# Patient Record
Sex: Male | Born: 1935 | Race: White | Hispanic: No | Marital: Married | Smoking: Never smoker
Health system: Southern US, Community
[De-identification: ages and names within clinical notes are randomized; demographics above are authoritative.]

## PROBLEM LIST (undated history)

## (undated) DIAGNOSIS — N429 Disorder of prostate, unspecified: Secondary | ICD-10-CM

## (undated) DIAGNOSIS — K469 Unspecified abdominal hernia without obstruction or gangrene: Secondary | ICD-10-CM

## (undated) DIAGNOSIS — T4145XA Adverse effect of unspecified anesthetic, initial encounter: Secondary | ICD-10-CM

## (undated) DIAGNOSIS — I509 Heart failure, unspecified: Secondary | ICD-10-CM

## (undated) DIAGNOSIS — N400 Enlarged prostate without lower urinary tract symptoms: Secondary | ICD-10-CM

## (undated) DIAGNOSIS — M1990 Unspecified osteoarthritis, unspecified site: Secondary | ICD-10-CM

## (undated) DIAGNOSIS — D649 Anemia, unspecified: Secondary | ICD-10-CM

## (undated) DIAGNOSIS — I10 Essential (primary) hypertension: Secondary | ICD-10-CM

## (undated) DIAGNOSIS — G4700 Insomnia, unspecified: Secondary | ICD-10-CM

## (undated) DIAGNOSIS — T8859XA Other complications of anesthesia, initial encounter: Secondary | ICD-10-CM

## (undated) DIAGNOSIS — N289 Disorder of kidney and ureter, unspecified: Secondary | ICD-10-CM

## (undated) HISTORY — PX: CATARACT EXTRACTION: SUR2

## (undated) HISTORY — DX: Unspecified abdominal hernia without obstruction or gangrene: K46.9

## (undated) HISTORY — DX: Anemia, unspecified: D64.9

## (undated) HISTORY — DX: Insomnia, unspecified: G47.00

## (undated) HISTORY — DX: Essential (primary) hypertension: I10

## (undated) HISTORY — DX: Heart failure, unspecified: I50.9

## (undated) HISTORY — DX: Benign prostatic hyperplasia without lower urinary tract symptoms: N40.0

## (undated) HISTORY — PX: CHOLECYSTECTOMY: SHX55

## (undated) HISTORY — DX: Disorder of prostate, unspecified: N42.9

## (undated) HISTORY — PX: JOINT REPLACEMENT: SHX530

---

## 2004-11-24 ENCOUNTER — Ambulatory Visit: Payer: Self-pay | Admitting: Internal Medicine

## 2004-11-29 ENCOUNTER — Encounter: Admission: RE | Admit: 2004-11-29 | Discharge: 2004-11-29 | Payer: Self-pay | Admitting: Orthopedic Surgery

## 2004-12-01 ENCOUNTER — Ambulatory Visit (HOSPITAL_COMMUNITY): Admission: RE | Admit: 2004-12-01 | Discharge: 2004-12-01 | Payer: Self-pay | Admitting: Orthopedic Surgery

## 2004-12-01 ENCOUNTER — Encounter (INDEPENDENT_AMBULATORY_CARE_PROVIDER_SITE_OTHER): Payer: Self-pay | Admitting: Specialist

## 2004-12-01 ENCOUNTER — Ambulatory Visit (HOSPITAL_BASED_OUTPATIENT_CLINIC_OR_DEPARTMENT_OTHER): Admission: RE | Admit: 2004-12-01 | Discharge: 2004-12-01 | Payer: Self-pay | Admitting: Orthopedic Surgery

## 2004-12-05 ENCOUNTER — Emergency Department (HOSPITAL_COMMUNITY): Admission: EM | Admit: 2004-12-05 | Discharge: 2004-12-05 | Payer: Self-pay | Admitting: Emergency Medicine

## 2005-05-26 ENCOUNTER — Ambulatory Visit: Payer: Self-pay | Admitting: Internal Medicine

## 2005-07-19 ENCOUNTER — Ambulatory Visit: Payer: Self-pay | Admitting: Internal Medicine

## 2005-07-25 ENCOUNTER — Ambulatory Visit: Payer: Self-pay

## 2006-02-17 ENCOUNTER — Ambulatory Visit: Payer: Self-pay | Admitting: Internal Medicine

## 2006-02-20 ENCOUNTER — Ambulatory Visit: Payer: Self-pay | Admitting: Internal Medicine

## 2006-02-21 ENCOUNTER — Ambulatory Visit: Payer: Self-pay | Admitting: Internal Medicine

## 2006-03-08 ENCOUNTER — Ambulatory Visit: Payer: Self-pay | Admitting: Internal Medicine

## 2006-08-28 ENCOUNTER — Ambulatory Visit: Payer: Self-pay | Admitting: Internal Medicine

## 2006-08-28 LAB — CONVERTED CEMR LAB
BUN: 19 mg/dL (ref 6–23)
CO2: 25 meq/L (ref 19–32)
Calcium: 9.2 mg/dL (ref 8.4–10.5)
Chloride: 109 meq/L (ref 96–112)
Creatinine, Ser: 1.8 mg/dL — ABNORMAL HIGH (ref 0.4–1.5)
GFR calc non Af Amer: 39 mL/min
Glomerular Filtration Rate, Af Am: 47 mL/min/{1.73_m2}
Glucose, Bld: 84 mg/dL (ref 70–99)
PSA: 2.51 ng/mL (ref 0.10–4.00)
Potassium: 4.1 meq/L (ref 3.5–5.1)
Sodium: 141 meq/L (ref 135–145)
Uric Acid, Serum: 8.9 mg/dL — ABNORMAL HIGH (ref 2.4–7.0)

## 2007-02-28 ENCOUNTER — Ambulatory Visit: Payer: Self-pay | Admitting: Internal Medicine

## 2007-02-28 LAB — CONVERTED CEMR LAB
BUN: 23 mg/dL (ref 6–23)
CO2: 22 meq/L (ref 19–32)
Calcium: 9.8 mg/dL (ref 8.4–10.5)
Chloride: 115 meq/L — ABNORMAL HIGH (ref 96–112)
Creatinine, Ser: 1.8 mg/dL — ABNORMAL HIGH (ref 0.4–1.5)
GFR calc Af Amer: 47 mL/min
GFR calc non Af Amer: 39 mL/min
Glucose, Bld: 119 mg/dL — ABNORMAL HIGH (ref 70–99)
Potassium: 4.2 meq/L (ref 3.5–5.1)
Sodium: 142 meq/L (ref 135–145)

## 2007-03-02 DIAGNOSIS — I10 Essential (primary) hypertension: Secondary | ICD-10-CM | POA: Insufficient documentation

## 2007-03-02 DIAGNOSIS — G4700 Insomnia, unspecified: Secondary | ICD-10-CM | POA: Insufficient documentation

## 2007-03-02 DIAGNOSIS — M109 Gout, unspecified: Secondary | ICD-10-CM

## 2007-03-12 DIAGNOSIS — N400 Enlarged prostate without lower urinary tract symptoms: Secondary | ICD-10-CM | POA: Insufficient documentation

## 2007-06-01 ENCOUNTER — Ambulatory Visit: Payer: Self-pay | Admitting: Internal Medicine

## 2007-11-26 ENCOUNTER — Ambulatory Visit: Payer: Self-pay | Admitting: Internal Medicine

## 2007-11-26 DIAGNOSIS — R7309 Other abnormal glucose: Secondary | ICD-10-CM | POA: Insufficient documentation

## 2007-11-26 DIAGNOSIS — N259 Disorder resulting from impaired renal tubular function, unspecified: Secondary | ICD-10-CM | POA: Insufficient documentation

## 2007-11-27 LAB — CONVERTED CEMR LAB
CO2: 25 meq/L (ref 19–32)
Cholesterol: 139 mg/dL (ref 0–200)
Creatinine, Ser: 1.9 mg/dL — ABNORMAL HIGH (ref 0.4–1.5)
GFR calc Af Amer: 44 mL/min
Glucose, Bld: 134 mg/dL — ABNORMAL HIGH (ref 70–99)
Potassium: 4.4 meq/L (ref 3.5–5.1)
VLDL: 37 mg/dL (ref 0–40)

## 2007-12-18 ENCOUNTER — Telehealth: Payer: Self-pay | Admitting: Internal Medicine

## 2008-05-28 ENCOUNTER — Ambulatory Visit: Payer: Self-pay | Admitting: Internal Medicine

## 2008-05-28 DIAGNOSIS — R6889 Other general symptoms and signs: Secondary | ICD-10-CM

## 2008-05-29 LAB — CONVERTED CEMR LAB
BUN: 35 mg/dL — ABNORMAL HIGH (ref 6–23)
Calcium: 9.7 mg/dL (ref 8.4–10.5)
GFR calc Af Amer: 41 mL/min
Glucose, Bld: 107 mg/dL — ABNORMAL HIGH (ref 70–99)

## 2008-08-11 ENCOUNTER — Encounter: Payer: Self-pay | Admitting: Internal Medicine

## 2008-09-02 ENCOUNTER — Telehealth: Payer: Self-pay | Admitting: Internal Medicine

## 2008-09-04 ENCOUNTER — Encounter: Payer: Self-pay | Admitting: Internal Medicine

## 2008-09-11 ENCOUNTER — Encounter: Payer: Self-pay | Admitting: Internal Medicine

## 2008-09-12 ENCOUNTER — Ambulatory Visit: Payer: Self-pay | Admitting: Internal Medicine

## 2008-09-16 ENCOUNTER — Telehealth: Payer: Self-pay | Admitting: *Deleted

## 2008-10-02 ENCOUNTER — Encounter: Payer: Self-pay | Admitting: Internal Medicine

## 2008-10-02 ENCOUNTER — Ambulatory Visit (HOSPITAL_COMMUNITY): Admission: RE | Admit: 2008-10-02 | Discharge: 2008-10-02 | Payer: Self-pay | Admitting: General Surgery

## 2008-10-02 LAB — CONVERTED CEMR LAB: Creatinine, Ser: 2.4 mg/dL

## 2008-10-06 ENCOUNTER — Ambulatory Visit: Payer: Self-pay | Admitting: Internal Medicine

## 2008-10-06 LAB — CONVERTED CEMR LAB: Sodium, Ur: 42 meq/L

## 2008-10-07 LAB — CONVERTED CEMR LAB
BUN: 57 mg/dL — ABNORMAL HIGH (ref 6–23)
CO2: 16 meq/L — ABNORMAL LOW (ref 19–32)
Calcium: 9.1 mg/dL (ref 8.4–10.5)
Creatinine, Ser: 2.6 mg/dL — ABNORMAL HIGH (ref 0.4–1.5)
GFR calc non Af Amer: 25 mL/min
Glucose, Bld: 117 mg/dL — ABNORMAL HIGH (ref 70–99)

## 2008-10-09 ENCOUNTER — Ambulatory Visit: Payer: Self-pay | Admitting: Internal Medicine

## 2008-10-10 ENCOUNTER — Encounter: Payer: Self-pay | Admitting: Internal Medicine

## 2008-10-10 LAB — CONVERTED CEMR LAB
GFR calc Af Amer: 41 mL/min
GFR calc non Af Amer: 34 mL/min
Glucose, Bld: 169 mg/dL — ABNORMAL HIGH (ref 70–99)
Potassium: 4.3 meq/L (ref 3.5–5.1)

## 2008-10-13 ENCOUNTER — Encounter: Payer: Self-pay | Admitting: Internal Medicine

## 2008-10-15 ENCOUNTER — Ambulatory Visit: Payer: Self-pay | Admitting: Internal Medicine

## 2008-10-16 LAB — CONVERTED CEMR LAB
Calcium: 8.7 mg/dL (ref 8.4–10.5)
Chloride: 105 meq/L (ref 96–112)
Potassium: 3.8 meq/L (ref 3.5–5.1)

## 2008-10-30 ENCOUNTER — Encounter: Payer: Self-pay | Admitting: Internal Medicine

## 2008-11-03 ENCOUNTER — Encounter: Payer: Self-pay | Admitting: Internal Medicine

## 2008-11-24 ENCOUNTER — Ambulatory Visit (HOSPITAL_COMMUNITY): Admission: RE | Admit: 2008-11-24 | Discharge: 2008-11-24 | Payer: Self-pay | Admitting: General Surgery

## 2008-11-24 ENCOUNTER — Encounter (INDEPENDENT_AMBULATORY_CARE_PROVIDER_SITE_OTHER): Payer: Self-pay | Admitting: General Surgery

## 2008-12-02 ENCOUNTER — Ambulatory Visit: Payer: Self-pay | Admitting: Internal Medicine

## 2008-12-04 LAB — CONVERTED CEMR LAB
BUN: 23 mg/dL (ref 6–23)
Creatinine, Ser: 1.6 mg/dL — ABNORMAL HIGH (ref 0.4–1.5)
GFR calc non Af Amer: 44.22 mL/min (ref >60)
Glucose, Bld: 101 mg/dL — ABNORMAL HIGH (ref 70–99)

## 2008-12-05 ENCOUNTER — Encounter: Payer: Self-pay | Admitting: Internal Medicine

## 2008-12-29 ENCOUNTER — Encounter: Payer: Self-pay | Admitting: Internal Medicine

## 2009-01-21 ENCOUNTER — Encounter: Payer: Self-pay | Admitting: Internal Medicine

## 2009-02-19 ENCOUNTER — Ambulatory Visit: Payer: Self-pay | Admitting: Internal Medicine

## 2009-03-06 ENCOUNTER — Encounter: Payer: Self-pay | Admitting: Internal Medicine

## 2009-05-14 ENCOUNTER — Ambulatory Visit: Payer: Self-pay | Admitting: Internal Medicine

## 2009-05-15 LAB — CONVERTED CEMR LAB
Calcium: 9.3 mg/dL (ref 8.4–10.5)
Creatinine, Ser: 1.9 mg/dL — ABNORMAL HIGH (ref 0.4–1.5)
Glucose, Bld: 98 mg/dL (ref 70–99)
HDL: 28.3 mg/dL — ABNORMAL LOW (ref >39.00)
Potassium: 4.5 meq/L (ref 3.5–5.1)
Sodium: 145 meq/L (ref 135–145)
Triglycerides: 100 mg/dL (ref 0.0–149.0)
VLDL: 20 mg/dL (ref 0.0–40.0)

## 2009-09-03 ENCOUNTER — Encounter: Payer: Self-pay | Admitting: Internal Medicine

## 2009-11-17 ENCOUNTER — Ambulatory Visit: Payer: Self-pay | Admitting: Internal Medicine

## 2009-11-17 DIAGNOSIS — D696 Thrombocytopenia, unspecified: Secondary | ICD-10-CM

## 2009-11-18 LAB — CONVERTED CEMR LAB
ALT: 23 units/L (ref 0–53)
BUN: 30 mg/dL — ABNORMAL HIGH (ref 6–23)
Basophils Relative: 0.3 % (ref 0.0–3.0)
Calcium: 9.7 mg/dL (ref 8.4–10.5)
Chloride: 112 meq/L (ref 96–112)
Creatinine, Ser: 1.7 mg/dL — ABNORMAL HIGH (ref 0.4–1.5)
GFR calc non Af Amer: 41.13 mL/min (ref >60)
Glucose, Bld: 98 mg/dL (ref 70–99)
Hgb A1c MFr Bld: 5.7 % (ref 4.6–6.5)
Lymphocytes Relative: 16.5 % (ref 12.0–46.0)
MCHC: 33.8 g/dL (ref 30.0–36.0)
Neutrophils Relative %: 73 % (ref 43.0–77.0)
Platelets: 96 10*3/uL — ABNORMAL LOW (ref 150.0–400.0)
RDW: 13.9 % (ref 11.5–14.6)
Sed Rate: 25 mm/hr — ABNORMAL HIGH (ref 0–22)
Sodium: 146 meq/L — ABNORMAL HIGH (ref 135–145)
Total Protein: 6.6 g/dL (ref 6.0–8.3)
WBC: 6 10*3/uL (ref 4.5–10.5)

## 2009-11-24 ENCOUNTER — Encounter: Payer: Self-pay | Admitting: Internal Medicine

## 2010-01-11 ENCOUNTER — Ambulatory Visit: Payer: Self-pay | Admitting: Internal Medicine

## 2010-01-11 LAB — CONVERTED CEMR LAB
ALT: 21 units/L (ref 0–53)
AST: 38 units/L — ABNORMAL HIGH (ref 0–37)
Alkaline Phosphatase: 133 units/L — ABNORMAL HIGH (ref 39–117)
Chloride: 113 meq/L — ABNORMAL HIGH (ref 96–112)
Creatinine, Ser: 1.4 mg/dL (ref 0.4–1.5)
Potassium: 4 meq/L (ref 3.5–5.1)
Sodium: 146 meq/L — ABNORMAL HIGH (ref 135–145)

## 2010-01-12 ENCOUNTER — Encounter: Payer: Self-pay | Admitting: Internal Medicine

## 2010-01-12 ENCOUNTER — Ambulatory Visit: Payer: Self-pay

## 2010-01-12 ENCOUNTER — Telehealth: Payer: Self-pay | Admitting: Internal Medicine

## 2010-04-01 ENCOUNTER — Telehealth: Payer: Self-pay | Admitting: Internal Medicine

## 2010-05-31 ENCOUNTER — Encounter: Payer: Self-pay | Admitting: Internal Medicine

## 2010-07-27 ENCOUNTER — Ambulatory Visit: Payer: Self-pay | Admitting: Internal Medicine

## 2010-07-27 LAB — CONVERTED CEMR LAB
Eosinophils Absolute: 0 10*3/uL (ref 0.0–0.7)
Ferritin: 201.5 ng/mL (ref 22.0–322.0)
HCT: 33.1 % — ABNORMAL LOW (ref 39.0–52.0)
Hemoglobin: 11.2 g/dL — ABNORMAL LOW (ref 13.0–17.0)
Lymphs Abs: 1 10*3/uL (ref 0.7–4.0)
MCHC: 33.8 g/dL (ref 30.0–36.0)
MCV: 98.3 fL (ref 78.0–100.0)
Monocytes Absolute: 0.4 10*3/uL (ref 0.1–1.0)
Neutro Abs: 2.9 10*3/uL (ref 1.4–7.7)
Platelets: 100 10*3/uL — ABNORMAL LOW (ref 150.0–400.0)
Saturation Ratios: 36.2 % (ref 20.0–50.0)
TSH: 3.34 microintl units/mL (ref 0.35–5.50)
Vitamin B-12: 392 pg/mL (ref 211–911)
WBC: 4.3 10*3/uL — ABNORMAL LOW (ref 4.5–10.5)

## 2010-07-30 ENCOUNTER — Encounter: Payer: Self-pay | Admitting: Internal Medicine

## 2010-07-30 ENCOUNTER — Ambulatory Visit: Payer: Self-pay | Admitting: Internal Medicine

## 2010-07-30 LAB — CONVERTED CEMR LAB: Sed Rate: 22 mm/hr (ref 0–22)

## 2010-08-09 LAB — CONVERTED CEMR LAB
Albumin ELP: 62.7 % (ref 55.8–66.1)
Basophils Absolute: 0 10*3/uL (ref 0.0–0.1)
Beta Globulin: 5.9 % (ref 4.7–7.2)
Eosinophils Absolute: 0.1 10*3/uL (ref 0.0–0.7)
Eosinophils Relative: 2 % (ref 0–5)
HCT: 33.8 % — ABNORMAL LOW (ref 39.0–52.0)
Hemoglobin: 11 g/dL — ABNORMAL LOW (ref 13.0–17.0)
Lymphs Abs: 1 10*3/uL (ref 0.7–4.0)
MCHC: 32.5 g/dL (ref 30.0–36.0)
Neutro Abs: 3.1 10*3/uL (ref 1.7–7.7)
Total Protein, Serum Electrophoresis: 6.1 g/dL (ref 6.0–8.3)

## 2010-09-21 NOTE — Letter (Signed)
Summary: [facility]  [facility]   Imported By: Maryln Gottron 06/07/2010 10:09:47  _____________________________________________________________________  External Attachment:    Type:   Image     Comment:   External Document

## 2010-09-21 NOTE — Miscellaneous (Signed)
Summary: Orders Update  Clinical Lists Changes  Problems: Added new problem of EDEMA (ICD-782.3) Orders: Added new Test order of Venous Duplex Lower Extremity (Venous Duplex Lower) - Signed 

## 2010-09-21 NOTE — Progress Notes (Signed)
Summary: [facility] - Pt Negative for DVT Bilateral  Phone Note Call from Patient   Caller: [facility]  -  [name]  Summary of Call: Pt is Negative for DVT Bilateral. Pt is being released.    Initial call taken by: Lucy Antigua,  Jan 12, 2010 2:18 PM

## 2010-09-21 NOTE — Assessment & Plan Note (Signed)
Summary: 6 MONTH ROV/NJR----PT RSC (BUMP) // RS rsc bmp ok per wife/njr   Vital Signs:  Patient profile:   77 year old male Weight:      153 pounds BMI:     26.36 Temp:     97.7 degrees F oral Pulse rate:   92 / minute Pulse rhythm:   regular Resp:     12 per minute BP sitting:   112 / 60  (left arm)  Vitals Entered By: Gladis Riffle, RN (November 17, 2009 10:31 AM) CC: 6 month rov, brought lab work copy Is Patient Diabetic? No Comments had CT scan abdomen last week by Dr Julien Girt for hematuria, gets results 4/5   CC:  6 month rov and brought lab work copy.  History of Present Illness: his main complaint is insomnia---he has trouble falling asleep and with frequent awakenings. he admits to daytime napping if he sleeps poorly during the night. Can't "turn my brain off"  also complains of being "cold" all of the time. He does not exercise. This has been ongoing for 6 months.   Hyperglycemia---brings in lab reports with glucose of 212  HTN--tolerating meds  renal insuff---needs f/u  All other systems reviewed and were negative   Preventive Screening-Counseling & Management  Alcohol-Tobacco     Smoking Status: never  Current Problems (verified): 1)  Renal Insufficiency  (ICD-588.9) 2)  Hyperglycemia  (ICD-790.29) 3)  Insomnia, Persistent  (ICD-307.42) 4)  Benign Prostatic Hypertrophy  (ICD-600.00) 5)  Hypertension  (ICD-401.9) 6)  Gout  (ICD-274.9)  Current Medications (verified): 1)  Colchicine 0.6 Mg  Tabs (Colchicine) .... Once Daily Tab 2)  Terazosin Hcl 5 Mg  Caps (Terazosin Hcl) .Marland Kitchen.. 1 Tab Once Daily 3)  Aspirin 81 Mg  Tbec (Aspirin) .... Take 1 Tablet By Mouth Once A Day Hold 4)  Lisinopril 40 Mg Tabs (Lisinopril) .... 1/2 Once Daily 5)  Allopurinol 300 Mg Tabs (Allopurinol) .... Take 1 Tablet By Mouth Once A Day  Allergies (verified): No Known Drug Allergies  Past History:  Past Medical History: Last updated:  03/13/2007 Gout--Tophareos insomnia Hypertension Benign prostatic hypertrophy  Past Surgical History: Last updated: 12/02/2008 Cataract extraction, bilateral Cholecystectomy inguinal hernia  Social History: Last updated: 06/01/2007 Married Never Smoked Regular exercise-yes  Risk Factors: Exercise: yes (06/01/2007)  Risk Factors: Smoking Status: never (11/17/2009)  Family History: NO DM  Review of Systems       All other systems reviewed and were negative   Physical Exam  General:  Well-developed,well-nourished,in no acute distress; alert,appropriate and cooperative throughout examination; blood pressure low normal Head:  normocephalic and atraumatic.   Eyes:  pupils equal and pupils round.   Neck:  No deformities, masses, or tenderness noted. Lungs:  normal respiratory effort and no intercostal retractions.   Heart:  normal rate and regular rhythm.   Abdomen:  soft and non-tender.   Msk:  No deformity or scoliosis noted of thoracic or lumbar spine.   Neurologic:  cranial nerves II-XII intact and gait normal.     Impression & Recommendations:  Problem # 1:  HYPERGLYCEMIA (ICD-790.29)  new dx of dm discussed at length regular exercise is critical  Orders: TLB-TSH (Thyroid Stimulating Hormone) (84443-TSH) TLB-Hepatic/Liver Function Pnl (80076-HEPATIC) TLB-A1C / Hgb A1C (Glycohemoglobin) (83036-A1C)  Problem # 2:  INSOMNIA, PERSISTENT (ICD-307.42) trial trazodone side effects discussed  Problem # 3:  HYPERTENSION (ICD-401.9)  congtrolled continue current medications  His updated medication list for this problem includes:    Terazosin Hcl 5  Mg Caps (Terazosin hcl) .Marland Kitchen... 1 tab once daily    Lisinopril 40 Mg Tabs (Lisinopril) .Marland Kitchen... 1/2 once daily  BP today: 112/60 Prior BP: 138/60 (05/14/2009)  Labs Reviewed: K+: 4.5 (05/14/2009) Creat: : 1.9 (05/14/2009)   Chol: 128 (05/14/2009)   HDL: 28.30 (05/14/2009)   LDL: 80 (05/14/2009)   TG: 100.0  (05/14/2009)  Orders: TLB-BMP (Basic Metabolic Panel-BMET) (80048-METABOL)  Problem # 4:  GOUT (ICD-274.9) no recurrence.  The following medications were removed from the medication list:    Uloric 40 Mg Tabs (Febuxostat) ..... One by mouth daily His updated medication list for this problem includes:    Allopurinol 300 Mg Tabs (Allopurinol) .Marland Kitchen... Take 1 tablet by mouth once a day  Problem # 5:  THROMBOCYTOPENIA (ICD-287.5)  reviewed labs from dr truslow  Orders: Venipuncture (16109) TLB-CBC Platelet - w/Differential (85025-CBCD) TLB-Sedimentation Rate (ESR) (85652-ESR)  Complete Medication List: 1)  Terazosin Hcl 5 Mg Caps (Terazosin hcl) .Marland Kitchen.. 1 tab once daily 2)  Aspirin 81 Mg Tbec (Aspirin) .... Take 1 tablet by mouth once a day hold 3)  Lisinopril 40 Mg Tabs (Lisinopril) .... 1/2 once daily 4)  Allopurinol 300 Mg Tabs (Allopurinol) .... Take 1 tablet by mouth once a day 5)  Trazodone Hcl 50 Mg Tabs (Trazodone hcl) .... 1/2-1 by mouth at bedtime as needed insomnia Prescriptions: TRAZODONE HCL 50 MG TABS (TRAZODONE HCL) 1/2-1 by mouth at bedtime as needed insomnia  #30 x 6   Entered and Authorized by:   Birdie Sons MD   Signed by:   Birdie Sons MD on 11/17/2009   Method used:   [facility] to        [facility]  [address] [phone] ([facility]ail[facility]       [address] [place], [place]  [address]       Ph: [phone] or [phone]       Fax: [phone]   RxID:   [id number]

## 2010-09-21 NOTE — Letter (Signed)
Summary: Rheumatology-Dr. Stacey Drain  Rheumatology-Dr. Stacey Drain   Imported By: Maryln Gottron 09/24/2009 13:49:41  _____________________________________________________________________  External Attachment:    Type:   Image     Comment:   External Document

## 2010-09-21 NOTE — Letter (Signed)
Summary: [facility]  [facility]   Imported By: Maryln Gottron 12/09/2009 14:40:50  _____________________________________________________________________  External Attachment:    Type:   Image     Comment:   External Document

## 2010-09-21 NOTE — Assessment & Plan Note (Signed)
Summary: bilateral feet edema/dm   Vital Signs:  Patient profile:   77 year old male Weight:      154.5 pounds BMI:     26.62 Pulse rate:   80 / minute Pulse rhythm:   regular Resp:     12 per minute BP sitting:   158 / 74  (left arm) Cuff size:   regular  Vitals Entered By: Gladis Riffle, RN (Jan 11, 2010 12:00 PM) CC: c/o bilateral ankle swelling x 1 week, worse since 01/07/10--thinks may be from colchicine Is Patient Diabetic? No   CC:  c/o bilateral ankle swelling x 1 week and worse since 01/07/10--thinks may be from colchicine.  History of Present Illness: gout flare 1-2 weeks ago..treated with colchicine--relief but since that time has had bilateral edema of feet. R>L edema is dependent not taking advil or aleve no pain sxs are described as minimal to moderated  All other systems reviewed and were negative   Preventive Screening-Counseling & Management  Alcohol-Tobacco     Smoking Status: never  Current Medications (verified): 1)  Terazosin Hcl 5 Mg  Caps (Terazosin Hcl) .Marland Kitchen.. 1 Tab Once Daily 2)  Aspirin 81 Mg  Tbec (Aspirin) .... Take 1 Tablet By Mouth Once A Day Hold 3)  Lisinopril 40 Mg Tabs (Lisinopril) .... 1/2 Once Daily 4)  Allopurinol 300 Mg Tabs (Allopurinol) .... Take 1 Tablet By Mouth Once A Day 5)  Colchicine 0.6mg  .... Take 1 Tablet By Mouth Once A Day As Needed  Allergies (verified): 1)  ! * Trazodone  Past History:  Past Medical History: Last updated: 03/13/2007 Gout--Tophareos insomnia Hypertension Benign prostatic hypertrophy  Past Surgical History: Last updated: 12/02/2008 Cataract extraction, bilateral Cholecystectomy inguinal hernia  Family History: Last updated: 11/17/2009 NO DM  Social History: Last updated: 06/01/2007 Married Never Smoked Regular exercise-yes  Risk Factors: Exercise: yes (06/01/2007)  Risk Factors: Smoking Status: never (01/11/2010)  Physical Exam  General:  Well-developed,well-nourished,in no  acute distress; alert,appropriate and cooperative throughout  Head:  normocephalic and atraumatic.   Ears:  R ear normal and L ear normal.   Neck:  No deformities, masses, or tenderness noted. Heart:  normal rate and regular rhythm.   Abdomen:  soft and non-tender.   Msk:  No deformity or scoliosis noted of thoracic or lumbar spine.   Pulses:  R radial normal and L radial normal.   Neurologic:  cranial nerves II-XII intact and gait normal.     Impression & Recommendations:  Problem # 1:  EDEMA- LOCALIZED (ICD-782.3) unclear etiology check labs if everything ok then no extensive treatment or eval Orders: Venipuncture (16109) T-D-Dimer Fibrin Derivatives Quantitive (60454-09811) TLB-BMP (Basic Metabolic Panel-BMET) (80048-METABOL) TLB-Hepatic/Liver Function Pnl (80076-HEPATIC)  Complete Medication List: 1)  Terazosin Hcl 5 Mg Caps (Terazosin hcl) .Marland Kitchen.. 1 tab once daily 2)  Aspirin 81 Mg Tbec (Aspirin) .... Take 1 tablet by mouth once a day hold 3)  Lisinopril 40 Mg Tabs (Lisinopril) .... 1/2 once daily 4)  Allopurinol 300 Mg Tabs (Allopurinol) .... Take 1 tablet by mouth once a day 5)  Colchicine 0.6mg   .... Take 1 tablet by mouth once a day as needed

## 2010-09-21 NOTE — Progress Notes (Signed)
Summary: Indocin  Phone Note Call from Patient   Caller: Patient Call For: Birdie Sons MD Summary of Call: Pt is having a flare of gout and would like to have Indocin called to pharmacy.  [facility]. Initial call taken by: Lynann Beaver CMA,  April 01, 2010 10:46 AM  Follow-up for Phone Call        he should avoid all NSAIDS because of previous renal insuff  Prednisone 20 mg by mouth once daily for 4 days Follow-up by: Birdie Sons MD,  April 01, 2010 12:37 PM   New Allergies: ! NSAIDS New/Updated Medications: PREDNISONE 20 MG TABS (PREDNISONE) one by mouth daily x 4 days New Allergies: ! NSAIDSPrescriptions: PREDNISONE 20 MG TABS (PREDNISONE) one by mouth daily x 4 days  #4 x 0   Entered by:   Lynann Beaver CMA   Authorized by:   Birdie Sons MD   Signed by:   Lynann Beaver CMA on 04/01/2010   Method used:   [facility]  [address] [id number]       [address] [place], [place]  [address]       Ph: [phone] or [phone]       Fax: [phone]   RxID:   [id number]  Pt notified.

## 2010-09-23 NOTE — Assessment & Plan Note (Signed)
Summary: CONSULT // RS   Vital Signs:  Patient profile:   77 year old male Height:      64 inches Weight:      154 pounds BMI:     26.53 Temp:     98.2 degrees F oral Pulse rate:   96 / minute BP sitting:   120 / 56  (left arm)  Vitals Entered By: Jeremy Johann CMA (July 27, 2010 10:58 AM) CC: discuss sensitivity to cold from waist up   CC:  discuss sensitivity to cold from waist up.  History of Present Illness: Cold "all of the time" ongoing for months.  He admits to some of this being psychological---he would rather be in [place]  All other systems reviewed and were negative   Current Medications (verified): 1)  Terazosin Hcl 5 Mg  Caps (Terazosin Hcl) .Marland Kitchen.. 1 Tab Once Daily 2)  Aspirin 81 Mg  Tbec (Aspirin) .... Take 1 Tablet By Mouth Once A Day Hold 3)  Lisinopril 40 Mg Tabs (Lisinopril) .... 1/2 Once Daily 4)  Allopurinol 300 Mg Tabs (Allopurinol) .... Take 1 Tablet By Mouth Once A Day 5)  Colchicine 0.6mg  .... Take 1 Tablet By Mouth Once A Day As Needed  Allergies (verified): 1)  ! * Trazodone 2)  ! Nsaids  Past History:  Past Medical History: Last updated: 03/13/2007 Gout--Tophareos insomnia Hypertension Benign prostatic hypertrophy  Past Surgical History: Last updated: 12/02/2008 Cataract extraction, bilateral Cholecystectomy inguinal hernia  Family History: Last updated: 11/17/2009 NO DM  Social History: Last updated: 06/01/2007 Married Never Smoked Regular exercise-yes  Risk Factors: Exercise: yes (06/01/2007)  Risk Factors: Smoking Status: never (01/11/2010)  Physical Exam  General:  elderly male in no acute distress. HEENT exam atraumatic, normocephalic symmetric muscles are intact. Neck is supple without lymphadenopathy or thyromegaly. Chest auscultation cardiac exam S1-S2 are regular. Abdominal exam active bowel sounds, soft. Extremities no clubbing cyanosis or edema.   Impression & Recommendations:  Problem # 1:  HYPERTENSION  (ICD-401.9)  controlled His updated medication list for this problem includes:    Terazosin Hcl 5 Mg Caps (Terazosin hcl) .Marland Kitchen... 1 tab once daily    Lisinopril 40 Mg Tabs (Lisinopril) .Marland Kitchen... 1/2 once daily  BP today: 120/56 Prior BP: 158/74 (01/11/2010)  Labs Reviewed: K+: 4.0 (01/11/2010) Creat: : 1.4 (01/11/2010)   Chol: 128 (05/14/2009)   HDL: 28.30 (05/14/2009)   LDL: 80 (05/14/2009)   TG: 100.0 (05/14/2009)  Problem # 2:  OTHER GENERAL SYMPTOMS (ICD-780.99)  needs TSH  Orders: Venipuncture (21308) Specimen Handling (65784) TLB-CBC Platelet - w/Differential (85025-CBCD) TLB-TSH (Thyroid Stimulating Hormone) (84443-TSH)  Problem # 3:  GOUT (ICD-274.9) no acute recurrence. His updated medication list for this problem includes:    Allopurinol 300 Mg Tabs (Allopurinol) .Marland Kitchen... Take 1 tablet by mouth once a day  Complete Medication List: 1)  Terazosin Hcl 5 Mg Caps (Terazosin hcl) .Marland Kitchen.. 1 tab once daily 2)  Aspirin 81 Mg Tbec (Aspirin) .... Take 1 tablet by mouth once a day hold 3)  Lisinopril 40 Mg Tabs (Lisinopril) .... 1/2 once daily 4)  Allopurinol 300 Mg Tabs (Allopurinol) .... Take 1 tablet by mouth once a day 5)  Colchicine 0.6mg   .... Take 1 tablet by mouth once a day as needed   Orders Added: 1)  Est. Patient Level III [69629] 2)  Venipuncture [52841] 3)  Specimen Handling [99000] 4)  TLB-CBC Platelet - w/Differential [85025-CBCD] 5)  TLB-TSH (Thyroid Stimulating Hormone) [32440-NUU]

## 2010-11-18 ENCOUNTER — Telehealth: Payer: Self-pay | Admitting: Internal Medicine

## 2010-11-18 NOTE — Telephone Encounter (Signed)
Pt can be seen tomorrow at the same with wife per Dr Cato Mulligan

## 2010-11-18 NOTE — Telephone Encounter (Signed)
Pt called and said that his wife has an ov with Dr Cato Mulligan tomorrow morning and pt is req to be seen at same time for swelling and rash on lft foot. 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

## 2010-11-19 ENCOUNTER — Encounter: Payer: Self-pay | Admitting: Internal Medicine

## 2010-11-19 ENCOUNTER — Ambulatory Visit (INDEPENDENT_AMBULATORY_CARE_PROVIDER_SITE_OTHER): Payer: Medicare Other | Admitting: Internal Medicine

## 2010-11-19 DIAGNOSIS — M109 Gout, unspecified: Secondary | ICD-10-CM

## 2010-11-19 MED ORDER — COLCHICINE 0.6 MG PO TABS: 0.6000 mg | ORAL_TABLET | Freq: Three times a day (TID) | ORAL | Status: DC | PRN

## 2010-11-19 MED ORDER — ALLOPURINOL 100 MG PO TABS: 100.0000 mg | ORAL_TABLET | Freq: Every day | ORAL | Status: DC

## 2010-11-19 NOTE — Progress Notes (Signed)
  Subjective:    [name], male    DOB: 18-Mar-1925, 77 y.o.   MRN: [id number]  HPI 4-5 day hx of red/swollen left foot---minimal pain Long hx of gout  No fever or chills He has not been compliant with allopurinol  Past Medical History  Diagnosis Date  . Gout   . Insomnia   . Hypertension   . BPH (benign prostatic hyperplasia)    Past Surgical History  Procedure Date  . Cataract extraction   . Cholecystectomy   . Hernia repair     reports that he has never smoked. He does not have any smokeless tobacco history on file. His alcohol and drug histories not on file. family history includes Heart failure in his father and mother.  There is no history of Diabetes. Allergies  Allergen Reactions  . Nsaids     REACTION: hx of renal failure     Review of Systems  patient denies chest pain, shortness of breath, orthopnea. Denies lower extremity edema, abdominal pain, change in appetite, change in bowel movements. Patient denies rashes, musculoskeletal complaints. No other specific complaints in a complete review of systems.      Objective:   Physical Exam  well-developed well-nourished male in no acute distress. HEENT exam atraumatic, normocephalic, neck supple without jugular venous distention. Chest clear to auscultation cardiac exam S1-S2 are regular. Abdominal exam overweight with bowel sounds, soft and nontender. Extremities no edema. Neurologic exam is alert with a normal gait. Warm, swollen left 1st MTP joint       Assessment & Plan:

## 2010-11-19 NOTE — Assessment & Plan Note (Signed)
I suspect the patient had a minor flare up of gout. Symptoms are resolving on its own. I think he got to resume allopurinol. He's had significant trouble with gout in the past. He knows to start allopurinol slowly. I called in a prescription of allopurinol and colchicine patient understands the side effects.

## 2010-12-02 LAB — CBC
HCT: 36.5 % — ABNORMAL LOW (ref 39.0–52.0)
Hemoglobin: 12.5 g/dL — ABNORMAL LOW (ref 13.0–17.0)
MCHC: 34.3 g/dL (ref 30.0–36.0)
MCV: 92.2 fL (ref 78.0–100.0)
Platelets: 113 10*3/uL — ABNORMAL LOW (ref 150–400)
RBC: 3.96 MIL/uL — ABNORMAL LOW (ref 4.22–5.81)
RDW: 14.7 % (ref 11.5–15.5)
WBC: 4.6 10*3/uL (ref 4.0–10.5)

## 2010-12-02 LAB — COMPREHENSIVE METABOLIC PANEL
ALT: 18 U/L (ref 0–53)
AST: 37 U/L (ref 0–37)
Albumin: 3.6 g/dL (ref 3.5–5.2)
Alkaline Phosphatase: 118 U/L — ABNORMAL HIGH (ref 39–117)
Calcium: 9.3 mg/dL (ref 8.4–10.5)
Potassium: 4 mEq/L (ref 3.5–5.1)
Total Protein: 5.5 g/dL — ABNORMAL LOW (ref 6.0–8.3)

## 2010-12-02 LAB — DIFFERENTIAL
Basophils Absolute: 0 10*3/uL (ref 0.0–0.1)
Lymphs Abs: 0.7 10*3/uL (ref 0.7–4.0)
Monocytes Absolute: 0.5 10*3/uL (ref 0.1–1.0)
Monocytes Relative: 10 % (ref 3–12)
Neutrophils Relative %: 72 % (ref 43–77)

## 2010-12-07 LAB — DIFFERENTIAL
Basophils Relative: 0 % (ref 0–1)
Lymphocytes Relative: 7 % — ABNORMAL LOW (ref 12–46)
Lymphs Abs: 0.5 10*3/uL — ABNORMAL LOW (ref 0.7–4.0)
Monocytes Absolute: 0.3 10*3/uL (ref 0.1–1.0)
Monocytes Relative: 3 % (ref 3–12)

## 2010-12-07 LAB — CBC
Hemoglobin: 13.7 g/dL (ref 13.0–17.0)
MCHC: 34.6 g/dL (ref 30.0–36.0)
Platelets: 121 10*3/uL — ABNORMAL LOW (ref 150–400)
RBC: 4.33 MIL/uL (ref 4.22–5.81)
WBC: 8.4 10*3/uL (ref 4.0–10.5)

## 2010-12-07 LAB — COMPREHENSIVE METABOLIC PANEL
Albumin: 3.9 g/dL (ref 3.5–5.2)
BUN: 52 mg/dL — ABNORMAL HIGH (ref 6–23)
CO2: 18 mEq/L — ABNORMAL LOW (ref 19–32)
Calcium: 9.9 mg/dL (ref 8.4–10.5)
Chloride: 115 mEq/L — ABNORMAL HIGH (ref 96–112)
Glucose, Bld: 116 mg/dL — ABNORMAL HIGH (ref 70–99)
Potassium: 5.2 mEq/L — ABNORMAL HIGH (ref 3.5–5.1)
Total Bilirubin: 1.2 mg/dL (ref 0.3–1.2)
Total Protein: 5.7 g/dL — ABNORMAL LOW (ref 6.0–8.3)

## 2011-01-04 NOTE — Op Note (Signed)
NAME:  [name], [name]              ACCOUNT NO.:  [id number]   MEDICAL RECORD NO.:  [id number]           PATIENT TYPE:   LOCATION:                                 FACILITY:   PHYSICIAN:  Ollen Gross. Vernell Morgans, M.D. DATE OF BIRTH:  22-Jul-1925   DATE OF PROCEDURE:  11/24/2008  DATE OF DISCHARGE:                               OPERATIVE REPORT   PREOPERATIVE DIAGNOSIS:  Left inguinal hernia.   POSTOPERATIVE DIAGNOSIS:  Left inguinal hernia, indirect.   PROCEDURE:  Left inguinal hernia repair with mesh.   SURGEON:  Ollen Gross. Vernell Morgans, MD   ANESTHESIA:  General via LMA.   PROCEDURE:  After informed consent was obtained, the patient was brought  to the operating room and placed in a supine position on the operating  room table.  After adequate induction of general anesthesia, the  patient's left groin area and the abdomen were prepped with Betadine and  draped in usual sterile manner.  The left groin area was then  infiltrated 0.25% Marcaine.  A small incision was made from the edge of  the tubercle on the left towards the anterior cephalic spine.  This  incision was carried down through the skin and subcutaneous tissue  sharply with the electrocautery until the fascia of the external oblique  was encountered.  Two small bridging veins were encountered that were  clamped with hemostasis, divided, and ligated with 3-0 silk ties.  The  external oblique fascia was opened along its fibers towards the apex of  the external ring with a 15-blade knife and Metzenbaum scissors.  A  Weitlaner retractor was deployed.  Blunt dissection was carried out of  the cord structures at the edge of the pubic tubercle until they could  be surrounded between 2 fingers.  Half-inch Penrose drain was placed  around the cord structures for retraction purposes.  The cord structures  were then gently skeletonized and a hernia sac was identified with the  cord.  It was gently separated from the rest of the cord structures  by  blunt finger dissection and some sharp dissection with the  electrocautery.  Once this was accomplished, the sac was opened.  There  were no visceral contents stuck within the sac and contents of the sac  were reduced.  The sac was ligated near its base with a 2-0 silk suture  ligature.  The distal sac was excised sharply and sent to pathology.  The stump of the sac was allowed to retract back near the transversalis  muscle.  The floor of the inguinal canal was then tightened up and  reinforced with multiple interrupted 0 Vicryl stitches.  At this point,  the hernia was well reduced and the 4 looked good.  Three vessel 6 piece  of multiple mesh was then chosen and cut to fit.  The mesh was sewed  inferiorly to the shelving edge of inguinal ligament with a running 2-0  Prolene stitch.  Tails were cut in the mesh laterally and these tails  were wrapped around the cord superiorly.  The mesh was sewed to the  muscular perotic strength layer of the transversalis with interrupted 3-  0 Prolene vertical mattress stitches lateral to the cord.  Tails of the  mesh were anchored to the shelving edge of inguinal ligament with  interrupted 2-0 Prolene stitches.  Once this was accomplished, the mesh  was in good position.  A hernia was well repaired without any tension.  The wound was irrigated with copious amounts of saline.  During the  dissection of the cord, the ilioinguinal nerve was identified was  involved with some scar tissue with the sac.  It was therefore clamped  proximally and distally, divided and ligated with 3-0 silk ties.  At  this point, the external oblique fascia was reapproximated with running  2-0 Vicryl stitch.  The wound was infiltrated with more 0.25% Marcaine.  The subcutaneous fascia was closed with a running 3-0 Vicryl stitch and  the skin was closed a  running for Monocryl subcuticular stitch.  Dermabond dressing was  applied.  The patient tolerated the procedure well.   At the end of the  case all needle, sponge, instrument counts were correct.  The patient  was then awakened and taken to recovery room in stable condition.  The  patient's testicle was in his scrotum at the end the case.      Ollen Gross. Vernell Morgans, M.D.  Electronically Signed     PST/MEDQ  D:  11/24/2008  T:  11/24/2008  Job:  [id number]

## 2011-01-07 NOTE — Op Note (Signed)
NAMESHLOME, BALDREE              ACCOUNT NO.:  [id number]   MEDICAL RECORD NO.:  [id number]          PATIENT TYPE:  AMB   LOCATION:  DSC                          FACILITY:  [facility]   PHYSICIAN:  Cindee Salt, M.D.       DATE OF BIRTH:  1924-09-28   DATE OF PROCEDURE:  12/01/2004  DATE OF DISCHARGE:                                 OPERATIVE REPORT   PREOPERATIVE DIAGNOSIS:  Gouty tophus x2, right index finger.   POSTOPERATIVE DIAGNOSIS:  Gouty tophus x2, right index finger.   OPERATION:  Excision of gouty tophi, right index finger.   SURGEON:  Cindee Salt, M.D.   ASSISTANT:  __________ , R.N.   ANESTHESIA:  IV regional.   HISTORY:  The patient is a 77 year old male with two large gouty tophi on  his right index, one palmarly which is irritating every time he attempts to  use his finger and one on the dorsal aspect and degenerative changes are  present to the distal interphalangeal joint.   DESCRIPTION OF PROCEDURE:  The patient was brought to the operating room  where a forearm based IV regional anesthetic was carried out without  difficulty.  He was prepped using DuraPrep in the supine position with the  right arm free. An oblique incision was made over the mass on the palmar  aspect of his index finger and carried down through subcutaneous tissue with  blunt sharp dissection.  The tophus was isolated.  Care was taken to  protect the neurovascular structures radially and ulnarly.  The tophus was  then removed without difficulty and sent to pathology.  The tophus proceeded  down into the flexor tendon, this was minimally debrided and copiously  irrigated and massaged out to remove any further tophi and uric acid  crystals until no further crystals could be expressed.  The wound was  finally irrigated and closed with interrupted 5-0 nylon sutures.  A separate  incision was then made over the dorsal aspect and carried down through the  subcutaneous tissue.  A large tophus was  present and this was followed down  into the joint which was debrided.  Osteophytes were removed.  The wound was  copiously irrigated with saline and closed with interrupted 5-0 nylon  sutures.  A sterile compressive dressing and splint to the finger was  applied. The patient tolerated the procedure well and was taken to the  recovery room for observation in satisfactory condition.  He is discharged  home to return to the [facility] in 1 week on Vicodin.      GK/MEDQ  D:  12/01/2004  T:  12/01/2004  Job:  [id number]

## 2011-02-17 ENCOUNTER — Other Ambulatory Visit (INDEPENDENT_AMBULATORY_CARE_PROVIDER_SITE_OTHER): Payer: Self-pay | Admitting: General Surgery

## 2011-02-17 ENCOUNTER — Encounter (HOSPITAL_COMMUNITY)
Admission: RE | Admit: 2011-02-17 | Discharge: 2011-02-17 | Disposition: A | Discharge status: Home / Self Care | Payer: Medicare Other | Source: Ambulatory Visit | Attending: General Surgery | Admitting: General Surgery

## 2011-02-17 ENCOUNTER — Ambulatory Visit (HOSPITAL_COMMUNITY)
Admission: RE | Admit: 2011-02-17 | Discharge: 2011-02-17 | Disposition: A | Discharge status: Home / Self Care | Payer: Medicare Other | Source: Ambulatory Visit | Attending: General Surgery | Admitting: General Surgery

## 2011-02-17 DIAGNOSIS — Z01811 Encounter for preprocedural respiratory examination: Secondary | ICD-10-CM

## 2011-02-17 DIAGNOSIS — I10 Essential (primary) hypertension: Secondary | ICD-10-CM | POA: Insufficient documentation

## 2011-02-17 DIAGNOSIS — Z01812 Encounter for preprocedural laboratory examination: Secondary | ICD-10-CM | POA: Insufficient documentation

## 2011-02-17 LAB — SURGICAL PCR SCREEN: MRSA, PCR: NEGATIVE

## 2011-02-17 LAB — CBC
MCV: 93.5 fL (ref 78.0–100.0)
Platelets: 108 10*3/uL — ABNORMAL LOW (ref 150–400)
RDW: 15.3 % (ref 11.5–15.5)
WBC: 6.2 10*3/uL (ref 4.0–10.5)

## 2011-02-17 LAB — DIFFERENTIAL
Basophils Absolute: 0 10*3/uL (ref 0.0–0.1)
Basophils Relative: 1 % (ref 0–1)
Eosinophils Absolute: 0 10*3/uL (ref 0.0–0.7)
Eosinophils Relative: 0 % (ref 0–5)

## 2011-02-17 LAB — BASIC METABOLIC PANEL
Chloride: 113 mEq/L — ABNORMAL HIGH (ref 96–112)
Creatinine, Ser: 1.93 mg/dL — ABNORMAL HIGH (ref 0.50–1.35)
GFR calc Af Amer: 40 mL/min — ABNORMAL LOW (ref >60)
Potassium: 5.3 mEq/L — ABNORMAL HIGH (ref 3.5–5.1)

## 2011-02-22 ENCOUNTER — Inpatient Hospital Stay (HOSPITAL_COMMUNITY)
Admission: RE | Admit: 2011-02-22 | Discharge: 2011-02-24 | DRG: 989 | Disposition: A | Discharge status: Home / Self Care | Payer: Medicare Other | Source: Ambulatory Visit | Attending: General Surgery | Admitting: General Surgery

## 2011-02-22 ENCOUNTER — Ambulatory Visit (HOSPITAL_COMMUNITY): Payer: Medicare Other

## 2011-02-22 DIAGNOSIS — M109 Gout, unspecified: Secondary | ICD-10-CM | POA: Diagnosis present

## 2011-02-22 DIAGNOSIS — K4090 Unilateral inguinal hernia, without obstruction or gangrene, not specified as recurrent: Secondary | ICD-10-CM | POA: Diagnosis present

## 2011-02-22 DIAGNOSIS — I10 Essential (primary) hypertension: Secondary | ICD-10-CM | POA: Diagnosis present

## 2011-02-22 DIAGNOSIS — R339 Retention of urine, unspecified: Secondary | ICD-10-CM | POA: Diagnosis present

## 2011-02-22 DIAGNOSIS — J989 Respiratory disorder, unspecified: Principal | ICD-10-CM | POA: Diagnosis present

## 2011-02-22 HISTORY — PX: HERNIA REPAIR: SHX51

## 2011-02-22 LAB — POCT I-STAT 7, (LYTES, BLD GAS, ICA,H+H)
Acid-base deficit: 11 mmol/L — ABNORMAL HIGH (ref 0.0–2.0)
Calcium, Ion: 1.39 mmol/L — ABNORMAL HIGH (ref 1.12–1.32)
O2 Saturation: 91 %
Potassium: 4.8 mEq/L (ref 3.5–5.1)
pCO2 arterial: 88.4 mmHg (ref 35.0–45.0)

## 2011-02-22 LAB — BASIC METABOLIC PANEL
CO2: 20 mEq/L (ref 19–32)
Chloride: 111 mEq/L (ref 96–112)
Glucose, Bld: 182 mg/dL — ABNORMAL HIGH (ref 70–99)
Potassium: 4.8 mEq/L (ref 3.5–5.1)
Sodium: 140 mEq/L (ref 135–145)

## 2011-02-23 ENCOUNTER — Inpatient Hospital Stay (HOSPITAL_COMMUNITY): Payer: Medicare Other

## 2011-02-23 LAB — CBC
HCT: 28 % — ABNORMAL LOW (ref 39.0–52.0)
Hemoglobin: 9.5 g/dL — ABNORMAL LOW (ref 13.0–17.0)
MCH: 32.3 pg (ref 26.0–34.0)
MCV: 95.2 fL (ref 78.0–100.0)
RBC: 2.94 MIL/uL — ABNORMAL LOW (ref 4.22–5.81)

## 2011-02-23 LAB — COMPREHENSIVE METABOLIC PANEL
CO2: 20 mEq/L (ref 19–32)
Calcium: 8.6 mg/dL (ref 8.4–10.5)
Creatinine, Ser: 2.01 mg/dL — ABNORMAL HIGH (ref 0.50–1.35)
GFR calc Af Amer: 38 mL/min — ABNORMAL LOW (ref >60)
GFR calc non Af Amer: 32 mL/min — ABNORMAL LOW (ref >60)
Glucose, Bld: 127 mg/dL — ABNORMAL HIGH (ref 70–99)

## 2011-02-24 LAB — BASIC METABOLIC PANEL
CO2: 18 mEq/L — ABNORMAL LOW (ref 19–32)
GFR calc non Af Amer: 32 mL/min — ABNORMAL LOW (ref >60)
Glucose, Bld: 114 mg/dL — ABNORMAL HIGH (ref 70–99)
Potassium: 4.6 mEq/L (ref 3.5–5.1)
Sodium: 140 mEq/L (ref 135–145)

## 2011-02-24 LAB — CBC
Hemoglobin: 10 g/dL — ABNORMAL LOW (ref 13.0–17.0)
MCHC: 33 g/dL (ref 30.0–36.0)
Platelets: 80 10*3/uL — ABNORMAL LOW (ref 150–400)
RBC: 3.17 MIL/uL — ABNORMAL LOW (ref 4.22–5.81)

## 2011-02-27 LAB — POCT I-STAT 7, (LYTES, BLD GAS, ICA,H+H)
Calcium, Ion: 1.31 mmol/L (ref 1.12–1.32)
Hemoglobin: 10.9 g/dL — ABNORMAL LOW (ref 13.0–17.0)
O2 Saturation: 89 %
pCO2 arterial: 58.1 mmHg (ref 35.0–45.0)
pH, Arterial: 7.108 — CL (ref 7.350–7.450)
pO2, Arterial: 76 mmHg — ABNORMAL LOW (ref 80.0–100.0)

## 2011-02-28 ENCOUNTER — Ambulatory Visit (INDEPENDENT_AMBULATORY_CARE_PROVIDER_SITE_OTHER): Payer: Medicare Other | Admitting: Internal Medicine

## 2011-02-28 ENCOUNTER — Encounter: Payer: Self-pay | Admitting: Internal Medicine

## 2011-02-28 DIAGNOSIS — N400 Enlarged prostate without lower urinary tract symptoms: Secondary | ICD-10-CM

## 2011-02-28 DIAGNOSIS — M109 Gout, unspecified: Secondary | ICD-10-CM

## 2011-02-28 DIAGNOSIS — N259 Disorder resulting from impaired renal tubular function, unspecified: Secondary | ICD-10-CM

## 2011-02-28 DIAGNOSIS — I10 Essential (primary) hypertension: Secondary | ICD-10-CM

## 2011-02-28 NOTE — Patient Instructions (Signed)
Limit your sodium (Salt) intake  Keep legs elevated as much as possible  Return office visit 2 weeks or as needed

## 2011-02-28 NOTE — Progress Notes (Signed)
  Subjective:    [name], male    DOB: [birth year], 77 y.o.   MRN: [id number]  HPI  77 year old patient who has a history of chronic kidney disease treated hypertension and BPH. Last week he underwent elective right inguinal hernia repair but had complications postoperatively.  He required an overnight stay in the ICU on ventilatory support and left the hospital 4 days ago.  Since his discharge he has had progressive edema of the lower extremities he was discharged with a Foley catheter and is scheduled to see urology in followup tomorrow for a voiding trial. He was seen by urology earlier today.  Electrolytes were checked which are not available at present. He was also placed on an unknown antibiotic due to pain involving the left knee. Denies any pulmonary complaints;  no symptoms of heart failure other than the peripheral edema. Hospital records reviewed    Review of Systems  Constitutional: Negative for fever, chills, appetite change and fatigue.  HENT: Negative for hearing loss, ear pain, congestion, sore throat, trouble swallowing, neck stiffness, dental problem, voice change and tinnitus.   Eyes: Negative for pain, discharge and visual disturbance.  Respiratory: Negative for cough, chest tightness, wheezing and stridor.   Cardiovascular: Positive for leg swelling. Negative for chest pain and palpitations.  Gastrointestinal: Negative for nausea, vomiting, abdominal pain, diarrhea, constipation, blood in stool and abdominal distention.  Genitourinary: Negative for urgency, hematuria, flank pain, discharge, difficulty urinating and genital sores.  Musculoskeletal: Positive for joint swelling. Negative for myalgias, back pain, arthralgias and gait problem.  Skin: Negative for rash.  Neurological: Negative for dizziness, syncope, speech difficulty, weakness, numbness and headaches.  Hematological: Negative for adenopathy. Does not bruise/bleed easily.  Psychiatric/Behavioral:  Negative for behavioral problems and dysphoric mood. The patient is not nervous/anxious.        Objective:   Physical Exam  Constitutional: He is oriented to person, place, and time. He appears well-developed.  HENT:  Head: Normocephalic.  Right Ear: External ear normal.  Left Ear: External ear normal.  Eyes: Conjunctivae and EOM are normal.  Neck: Normal range of motion.  Cardiovascular: Normal rate and normal heart sounds.   Pulmonary/Chest: Effort normal and breath sounds normal. No respiratory distress. He has no wheezes. He has no rales.       O2 saturation 97%  Abdominal: Bowel sounds are normal.  Musculoskeletal: Normal range of motion. He exhibits edema. He exhibits no tenderness.       +2 edema distal to the knees The left anterior tibial tuberosity was tender and slightly swollen  Neurological: He is alert and oriented to person, place, and time.  Psychiatric: He has a normal mood and affect. His behavior is normal.          Assessment & Plan:   Acute on  chronic renal insufficiency Urinary retention. Followup will urology in the morning for a voiding trial continue alpha-blocker therapy Hypertension stable  Recommend elevation and a small restricted diet. Will plan off on diuretic therapy at this time we'll recheck in 2 weeks or as needed

## 2011-03-02 ENCOUNTER — Emergency Department (HOSPITAL_COMMUNITY)
Admission: EM | Admit: 2011-03-02 | Discharge: 2011-03-02 | Disposition: A | Discharge status: Home / Self Care | Payer: Medicare Other | Attending: Emergency Medicine | Admitting: Emergency Medicine

## 2011-03-02 DIAGNOSIS — I10 Essential (primary) hypertension: Secondary | ICD-10-CM | POA: Insufficient documentation

## 2011-03-02 DIAGNOSIS — M109 Gout, unspecified: Secondary | ICD-10-CM | POA: Insufficient documentation

## 2011-03-02 DIAGNOSIS — R109 Unspecified abdominal pain: Secondary | ICD-10-CM | POA: Insufficient documentation

## 2011-03-02 DIAGNOSIS — R339 Retention of urine, unspecified: Secondary | ICD-10-CM | POA: Insufficient documentation

## 2011-03-02 LAB — URINALYSIS, ROUTINE W REFLEX MICROSCOPIC
Bilirubin Urine: NEGATIVE
Hgb urine dipstick: NEGATIVE
Ketones, ur: NEGATIVE mg/dL
Nitrite: NEGATIVE
Protein, ur: NEGATIVE mg/dL
Specific Gravity, Urine: 1.009 (ref 1.005–1.030)
Urobilinogen, UA: 0.2 mg/dL (ref 0.0–1.0)

## 2011-03-03 LAB — URINE CULTURE
Colony Count: NO GROWTH
Culture  Setup Time: 201207110923
Culture: NO GROWTH

## 2011-03-09 NOTE — Op Note (Signed)
NAMEYOSHUA, GEISINGER              ACCOUNT NO.:  [id number]  MEDICAL RECORD NO.:  [id number]  LOCATION:  2308                         FACILITY:  [facility]  PHYSICIAN:  Ollen Gross. Vernell Morgans, M.D. DATE OF BIRTH:  [birth year]  DATE OF PROCEDURE:  02/22/2011 DATE OF DISCHARGE:                              OPERATIVE REPORT   PREOPERATIVE DIAGNOSIS:  Right inguinal hernia.  POSTOPERATIVE DIAGNOSIS:  Right indirect inguinal hernia.  PROCEDURE:  Right inguinal repair with mesh.  SURGEON:  Ollen Gross. Vernell Morgans, MD  ANESTHESIA:  General endotracheal.  PROCEDURE:  After informed consent was obtained, the patient was brought to the operating room and placed in supine position on the operating room table.  After adequate induction of general anesthesia, the patient's abdomen and right groin area were prepped with ChloraPrep, allowed to dry, and draped in the usual sterile manner.  The right groin area was then infiltrated with 0.25% Marcaine.  An incision was then made from the edge of the pubic tubercle on the right towards the anterosuperior iliac spine.  This incision was carried down through the skin and subcutaneous tissue sharply with electrocautery until the external oblique fascia was encountered.  Two small bridging veins were clamped with hemostats, divided and ligated with 3-0 silk ties.  The external oblique fascia was opened along its fibers towards the apex of the external ring with a #15 blade knife and Metzenbaum scissors.  Blunt dissection was then carried out of the cord structures until the cord could be surrounded between two fingers.  A 0.5-inch Penrose drain was placed around the cord structures.  A Weitlaner retractor was deployed. The ilioinguinal nerve was identified and it was dissected free from the rest of the structures in the canal, clamped proximally and distally, divided and ligated with 3-0 silk ties.  The cord structure were then gently skeletonized.  Three was a  hernia sac associated with the cord that we were able to gently separate from the rest of the cord structures without difficulty.  We opened the sac, but there was some thickened fat that was adherent to the wall consistent with a sliding hernia.  We therefore closed the hole and the sac with 2-0 silk stitch and then reduced the sac back beneath the transversalis muscle.  We then repaired the floor of the canal with figure-of-eight 0 Vicryl stitch. The tails of Vicryl left along at the edge of the cord.  A 3 x 6 piece of Ultrapro mesh was then chosen and cut to fit.  The mesh was sewn inferiorly to the shelving edge of the inguinal ligament with a running 2-0 Prolene stitch.  Tails were cut in the mesh laterally and the tails were wrapped around the cord structures.  The Vicryl was brought through the middle of the mesh and tied down superiorly.  The mesh was sewed to the muscular aponeurotic strength layer of the transversalis with interrupted 2-0 Prolene vertical mattress stitches lateral to the cord. The tails of the mesh were anchored to the shelving edge of inguinal ligament with interrupted 2-0 Prolene stitch.  Once this was accomplished, the mesh was in good position.  The hernia appeared to be well  repaired.  The wound was irrigated with copious amounts of saline. The external oblique fascia was then reapproximated with a running 2-0 Vicryl stitch.  The wound was infiltrated with more 0.25% Marcaine.  The subcutaneous fascia was closed with a running 3-0 Vicryl stitch.  The skin was closed with running 4-0 Monocryl stitch and Dermabond dressing was applied.  The patient tolerated the procedure well.  At the end of the case, all needle, sponge, and instrument counts were correct.  The patient was awakened and taken to the recovery room in stable condition.  The patient's testicle was in the scrotum at the end of the case.     Ollen Gross. Vernell Morgans, M.D.     PST/MEDQ  D:   02/22/2011  T:  02/23/2011  Job:  [id number]  Electronically Signed by Chevis Pretty III M.D. on 03/09/2011 01:50:02 PM

## 2011-03-11 ENCOUNTER — Emergency Department (HOSPITAL_COMMUNITY)
Admission: EM | Admit: 2011-03-11 | Discharge: 2011-03-11 | Disposition: A | Discharge status: Home / Self Care | Payer: Medicare Other | Attending: Emergency Medicine | Admitting: Emergency Medicine

## 2011-03-11 DIAGNOSIS — Y846 Urinary catheterization as the cause of abnormal reaction of the patient, or of later complication, without mention of misadventure at the time of the procedure: Secondary | ICD-10-CM | POA: Insufficient documentation

## 2011-03-11 DIAGNOSIS — T83091A Other mechanical complication of indwelling urethral catheter, initial encounter: Secondary | ICD-10-CM | POA: Insufficient documentation

## 2011-03-11 DIAGNOSIS — I10 Essential (primary) hypertension: Secondary | ICD-10-CM | POA: Insufficient documentation

## 2011-03-11 DIAGNOSIS — Z9889 Other specified postprocedural states: Secondary | ICD-10-CM | POA: Insufficient documentation

## 2011-03-14 ENCOUNTER — Ambulatory Visit (INDEPENDENT_AMBULATORY_CARE_PROVIDER_SITE_OTHER): Payer: Medicare Other | Admitting: Internal Medicine

## 2011-03-14 ENCOUNTER — Encounter: Payer: Self-pay | Admitting: Internal Medicine

## 2011-03-14 DIAGNOSIS — I10 Essential (primary) hypertension: Secondary | ICD-10-CM

## 2011-03-14 DIAGNOSIS — N259 Disorder resulting from impaired renal tubular function, unspecified: Secondary | ICD-10-CM

## 2011-03-14 NOTE — Progress Notes (Signed)
  Subjective:    [name], male    DOB: Jan 04, 1925, 77 y.o.   MRN: [id number]  HPI  77 year old patient who is seen today in followup. He was hospitalized recently and postoperatively had some complications that included acute on chronic renal insufficiency. His creatinine has now dropped to baseline. He feels well today. Unfortunately he is still requiring a Foley catheterization due to voiding difficulties. He has been followed closely by urology. New concerns or complaints. He has treated hypertension.    Review of Systems  Constitutional: Negative for fever, chills, appetite change and fatigue.  HENT: Negative for hearing loss, ear pain, congestion, sore throat, trouble swallowing, neck stiffness, dental problem, voice change and tinnitus.   Eyes: Negative for pain, discharge and visual disturbance.  Respiratory: Negative for cough, chest tightness, wheezing and stridor.   Cardiovascular: Negative for chest pain, palpitations and leg swelling.  Gastrointestinal: Negative for nausea, vomiting, abdominal pain, diarrhea, constipation, blood in stool and abdominal distention.  Genitourinary: Negative for urgency, hematuria, flank pain, discharge, difficulty urinating and genital sores.  Musculoskeletal: Negative for myalgias, back pain, joint swelling, arthralgias and gait problem.  Skin: Negative for rash.  Neurological: Negative for dizziness, syncope, speech difficulty, weakness, numbness and headaches.  Hematological: Negative for adenopathy. Does not bruise/bleed easily.  Psychiatric/Behavioral: Negative for behavioral problems and dysphoric mood. The patient is not nervous/anxious.        Objective:   Physical Exam  Constitutional: He is oriented to person, place, and time. He appears well-developed.  HENT:  Head: Normocephalic.  Right Ear: External ear normal.  Left Ear: External ear normal.  Eyes: Conjunctivae and EOM are normal.  Neck: Normal range of  motion.  Cardiovascular: Normal rate and normal heart sounds.   Pulmonary/Chest: Breath sounds normal.  Abdominal: Bowel sounds are normal.  Genitourinary:       Foley catheter in place  Musculoskeletal: Normal range of motion. He exhibits no edema and no tenderness.  Neurological: He is alert and oriented to person, place, and time.  Psychiatric: He has a normal mood and affect. His behavior is normal.          Assessment & Plan:   Urinary retention hypertension stable Chronic kidney disease  Medications unchanged. Followup urology. Recheck here in 3 months. Will check a electrolytes BUN creatinine and hemoglobin A1c at that time

## 2011-03-14 NOTE — Patient Instructions (Signed)
Limit your sodium (Salt) intake  Return in 3 months for follow-up   

## 2011-03-21 ENCOUNTER — Ambulatory Visit (INDEPENDENT_AMBULATORY_CARE_PROVIDER_SITE_OTHER): Payer: Medicare Other | Admitting: General Surgery

## 2011-03-21 ENCOUNTER — Encounter (INDEPENDENT_AMBULATORY_CARE_PROVIDER_SITE_OTHER): Payer: Self-pay | Admitting: General Surgery

## 2011-03-21 DIAGNOSIS — K4090 Unilateral inguinal hernia, without obstruction or gangrene, not specified as recurrent: Secondary | ICD-10-CM | POA: Insufficient documentation

## 2011-03-21 NOTE — Progress Notes (Signed)
Subjective:     Patient ID: LEGACY LACIVITA, male   DOB: 01/18/1925, 77 y.o.   MRN: [id number]  HPI The patient is an 77 year old white male who is now almost a month out from a right inguinal hernia with Mesh. His postoperative course has been complicated by urinary retention. He seen Dr. Brunilda Payor for this. Otherwise is not having any pain. His appetite is good. His bowels are moving normally. Review of Systems     Objective:   Physical Exam On exam his right groin incision is healing very nicely. He has a normal healing ridge. He has no palpable evidence for recurrence of his hernia.    Assessment:     Postop from a right inguinal hernia repair with mesh.    Plan:     We will plan to see him back in about a month to check his progress. He will continue to follow Dr. Brunilda Payor for his urinary retention issues.

## 2011-03-21 NOTE — Patient Instructions (Signed)
No heavy lifting F/U in 1 month 

## 2011-03-30 NOTE — Discharge Summary (Signed)
  Jordan, Horn              ACCOUNT NO.:  [id number]  MEDICAL RECORD NO.:  [id number]  LOCATION:  5125                         FACILITY:  [facility]  PHYSICIAN:  Ollen Gross. Vernell Morgans, M.D. DATE OF BIRTH:  08-17-1925  DATE OF ADMISSION:  02/22/2011 DATE OF DISCHARGE:  02/24/2011                              DISCHARGE SUMMARY   [name] is an 77 year old gentleman who was brought to the operating room and underwent a right inguinal hernia repair on February 22, 2011, in the PACU, he was not responding very well.  He had to be reintubated.  He was able to be extubated on postop day #1.  He was now awake and alert on postop day #1 and able to be transferred out to the floor.  On postop day #2, he was having some urinary retention.  He had to have his Foley replaced, follow up for this was arranged with Dr. Brunilda Payor in Urology and he was tolerating his diet.  His pain was under control and he was ambulating.  At this point, on February 24, 2011, he was discharged home.  MEDICATIONS:  He was to resume his home meds.  He was given a prescription for pain medicine.  DIET:  As tolerated.  ACTIVITIES:  No heavy lifting.  FINAL DIAGNOSIS:  Right inguinal hernia repair.  FOLLOWUP:  Follow up with Dr. Carolynne Edouard in a week and with Dr. Brunilda Payor in a week.  He was discharged home.     Ollen Gross. Vernell Morgans, M.D.     PST/MEDQ  D:  03/26/2011  T:  03/26/2011  Job:  [id number]  Electronically Signed by Chevis Pretty III M.D. on 03/30/2011 08:32:09 AM

## 2011-04-07 ENCOUNTER — Inpatient Hospital Stay (HOSPITAL_COMMUNITY)
Admission: EM | Admit: 2011-04-07 | Discharge: 2011-04-11 | DRG: 981 | Disposition: A | Discharge status: Skilled Nursing Facility | Payer: Medicare Other | Attending: Internal Medicine | Admitting: Internal Medicine

## 2011-04-07 ENCOUNTER — Emergency Department (HOSPITAL_COMMUNITY): Payer: Medicare Other

## 2011-04-07 ENCOUNTER — Inpatient Hospital Stay (HOSPITAL_COMMUNITY): Payer: Medicare Other

## 2011-04-07 DIAGNOSIS — D696 Thrombocytopenia, unspecified: Secondary | ICD-10-CM | POA: Diagnosis present

## 2011-04-07 DIAGNOSIS — I951 Orthostatic hypotension: Secondary | ICD-10-CM | POA: Diagnosis present

## 2011-04-07 DIAGNOSIS — G9341 Metabolic encephalopathy: Secondary | ICD-10-CM | POA: Diagnosis present

## 2011-04-07 DIAGNOSIS — M109 Gout, unspecified: Secondary | ICD-10-CM | POA: Diagnosis present

## 2011-04-07 DIAGNOSIS — R17 Unspecified jaundice: Secondary | ICD-10-CM | POA: Diagnosis present

## 2011-04-07 DIAGNOSIS — E872 Acidosis, unspecified: Secondary | ICD-10-CM | POA: Diagnosis present

## 2011-04-07 DIAGNOSIS — N189 Chronic kidney disease, unspecified: Secondary | ICD-10-CM | POA: Diagnosis present

## 2011-04-07 DIAGNOSIS — R319 Hematuria, unspecified: Secondary | ICD-10-CM | POA: Diagnosis present

## 2011-04-07 DIAGNOSIS — N400 Enlarged prostate without lower urinary tract symptoms: Secondary | ICD-10-CM | POA: Diagnosis present

## 2011-04-07 DIAGNOSIS — I129 Hypertensive chronic kidney disease with stage 1 through stage 4 chronic kidney disease, or unspecified chronic kidney disease: Secondary | ICD-10-CM | POA: Diagnosis present

## 2011-04-07 DIAGNOSIS — Y93E8 Activity, other personal hygiene: Secondary | ICD-10-CM

## 2011-04-07 DIAGNOSIS — S7223XA Displaced subtrochanteric fracture of unspecified femur, initial encounter for closed fracture: Secondary | ICD-10-CM | POA: Diagnosis present

## 2011-04-07 DIAGNOSIS — W1809XA Striking against other object with subsequent fall, initial encounter: Secondary | ICD-10-CM | POA: Diagnosis present

## 2011-04-07 DIAGNOSIS — R627 Adult failure to thrive: Secondary | ICD-10-CM | POA: Diagnosis present

## 2011-04-07 DIAGNOSIS — T148XXA Other injury of unspecified body region, initial encounter: Secondary | ICD-10-CM

## 2011-04-07 DIAGNOSIS — Y92009 Unspecified place in unspecified non-institutional (private) residence as the place of occurrence of the external cause: Secondary | ICD-10-CM

## 2011-04-07 DIAGNOSIS — B961 Klebsiella pneumoniae [K. pneumoniae] as the cause of diseases classified elsewhere: Secondary | ICD-10-CM | POA: Diagnosis present

## 2011-04-07 DIAGNOSIS — D62 Acute posthemorrhagic anemia: Secondary | ICD-10-CM | POA: Diagnosis not present

## 2011-04-07 DIAGNOSIS — E871 Hypo-osmolality and hyponatremia: Secondary | ICD-10-CM | POA: Diagnosis present

## 2011-04-07 DIAGNOSIS — Z79899 Other long term (current) drug therapy: Secondary | ICD-10-CM

## 2011-04-07 DIAGNOSIS — N390 Urinary tract infection, site not specified: Principal | ICD-10-CM | POA: Diagnosis present

## 2011-04-07 DIAGNOSIS — Y998 Other external cause status: Secondary | ICD-10-CM

## 2011-04-07 DIAGNOSIS — T448X5A Adverse effect of centrally-acting and adrenergic-neuron-blocking agents, initial encounter: Secondary | ICD-10-CM | POA: Diagnosis present

## 2011-04-07 LAB — POCT I-STAT TROPONIN I: Troponin i, poc: 0 ng/mL (ref 0.00–0.08)

## 2011-04-07 LAB — POCT I-STAT, CHEM 8
Calcium, Ion: 1.09 mmol/L — ABNORMAL LOW (ref 1.12–1.32)
Chloride: 99 mEq/L (ref 96–112)
Glucose, Bld: 112 mg/dL — ABNORMAL HIGH (ref 70–99)
HCT: 31 % — ABNORMAL LOW (ref 39.0–52.0)
TCO2: 16 mmol/L (ref 0–100)

## 2011-04-07 LAB — DIFFERENTIAL
Basophils Absolute: 0 10*3/uL (ref 0.0–0.1)
Basophils Relative: 0 % (ref 0–1)
Eosinophils Absolute: 0 10*3/uL (ref 0.0–0.7)
Neutrophils Relative %: 87 % — ABNORMAL HIGH (ref 43–77)
WBC Morphology: INCREASED

## 2011-04-07 LAB — URINALYSIS, ROUTINE W REFLEX MICROSCOPIC
Bilirubin Urine: NEGATIVE
Ketones, ur: NEGATIVE mg/dL
Nitrite: POSITIVE — AB
Protein, ur: 30 mg/dL — AB
pH: 6 (ref 5.0–8.0)

## 2011-04-07 LAB — CK TOTAL AND CKMB (NOT AT ARMC): CK, MB: 6.4 ng/mL (ref 0.3–4.0)

## 2011-04-07 LAB — TSH: TSH: 2.3 u[IU]/mL (ref 0.350–4.500)

## 2011-04-07 LAB — DIC (DISSEMINATED INTRAVASCULAR COAGULATION)PANEL
Fibrinogen: 336 mg/dL (ref 204–475)
Platelets: 82 10*3/uL — ABNORMAL LOW (ref 150–400)
Smear Review: NONE SEEN
aPTT: 37 seconds (ref 24–37)

## 2011-04-07 LAB — MRSA PCR SCREENING: MRSA by PCR: NEGATIVE

## 2011-04-07 LAB — BLOOD GAS, ARTERIAL
Bicarbonate: 17.4 mEq/L — ABNORMAL LOW (ref 20.0–24.0)
O2 Saturation: 96.3 %
Patient temperature: 98.6

## 2011-04-07 LAB — BILIRUBIN, FRACTIONATED(TOT/DIR/INDIR)
Bilirubin, Direct: 0.5 mg/dL — ABNORMAL HIGH (ref 0.0–0.3)
Indirect Bilirubin: 1.3 mg/dL — ABNORMAL HIGH (ref 0.3–0.9)
Total Bilirubin: 1.8 mg/dL — ABNORMAL HIGH (ref 0.3–1.2)

## 2011-04-07 LAB — COMPREHENSIVE METABOLIC PANEL
ALT: 19 U/L (ref 0–53)
Albumin: 3.3 g/dL — ABNORMAL LOW (ref 3.5–5.2)
Alkaline Phosphatase: 101 U/L (ref 39–117)
Chloride: 95 mEq/L — ABNORMAL LOW (ref 96–112)
Glucose, Bld: 113 mg/dL — ABNORMAL HIGH (ref 70–99)
Potassium: 3.8 mEq/L (ref 3.5–5.1)
Sodium: 125 mEq/L — ABNORMAL LOW (ref 135–145)
Total Protein: 5.4 g/dL — ABNORMAL LOW (ref 6.0–8.3)

## 2011-04-07 LAB — CBC
HCT: 27 % — ABNORMAL LOW (ref 39.0–52.0)
MCHC: 34.8 g/dL (ref 30.0–36.0)
RDW: 14 % (ref 11.5–15.5)

## 2011-04-07 LAB — URINE MICROSCOPIC-ADD ON

## 2011-04-08 DIAGNOSIS — I359 Nonrheumatic aortic valve disorder, unspecified: Secondary | ICD-10-CM

## 2011-04-08 LAB — COMPREHENSIVE METABOLIC PANEL
ALT: 18 U/L (ref 0–53)
AST: 37 U/L (ref 0–37)
Albumin: 3 g/dL — ABNORMAL LOW (ref 3.5–5.2)
CO2: 19 mEq/L (ref 19–32)
Chloride: 103 mEq/L (ref 96–112)
Creatinine, Ser: 1.32 mg/dL (ref 0.50–1.35)
GFR calc non Af Amer: 52 mL/min — ABNORMAL LOW (ref >60)
Sodium: 132 mEq/L — ABNORMAL LOW (ref 135–145)
Total Bilirubin: 1.7 mg/dL — ABNORMAL HIGH (ref 0.3–1.2)

## 2011-04-08 LAB — BASIC METABOLIC PANEL
BUN: 20 mg/dL (ref 6–23)
CO2: 18 mEq/L — ABNORMAL LOW (ref 19–32)
Glucose, Bld: 98 mg/dL (ref 70–99)
Potassium: 4.5 mEq/L (ref 3.5–5.1)
Sodium: 133 mEq/L — ABNORMAL LOW (ref 135–145)

## 2011-04-08 LAB — CBC
Platelets: 102 10*3/uL — ABNORMAL LOW (ref 150–400)
RBC: 3.26 MIL/uL — ABNORMAL LOW (ref 4.22–5.81)
RDW: 14.1 % (ref 11.5–15.5)
WBC: 11.6 10*3/uL — ABNORMAL HIGH (ref 4.0–10.5)

## 2011-04-08 LAB — OSMOLALITY, URINE: Osmolality, Ur: 358 mOsm/kg — ABNORMAL LOW (ref 390–1090)

## 2011-04-08 LAB — HEPATIC FUNCTION PANEL
AST: 38 U/L — ABNORMAL HIGH (ref 0–37)
Albumin: 3.1 g/dL — ABNORMAL LOW (ref 3.5–5.2)
Alkaline Phosphatase: 93 U/L (ref 39–117)
Total Bilirubin: 1.7 mg/dL — ABNORMAL HIGH (ref 0.3–1.2)
Total Protein: 5.5 g/dL — ABNORMAL LOW (ref 6.0–8.3)

## 2011-04-08 LAB — CARDIAC PANEL(CRET KIN+CKTOT+MB+TROPI)
CK, MB: 6.9 ng/mL (ref 0.3–4.0)
Troponin I: <0.3 ng/mL (ref <0.30)

## 2011-04-09 ENCOUNTER — Inpatient Hospital Stay (HOSPITAL_COMMUNITY): Payer: Medicare Other

## 2011-04-09 LAB — BASIC METABOLIC PANEL
BUN: 18 mg/dL (ref 6–23)
Calcium: 8.3 mg/dL — ABNORMAL LOW (ref 8.4–10.5)
Creatinine, Ser: 1.51 mg/dL — ABNORMAL HIGH (ref 0.50–1.35)
GFR calc Af Amer: 54 mL/min — ABNORMAL LOW (ref >60)
GFR calc non Af Amer: 44 mL/min — ABNORMAL LOW (ref >60)

## 2011-04-09 LAB — URINE CULTURE: Culture  Setup Time: 201208170135

## 2011-04-09 NOTE — Op Note (Signed)
NAMECATHAN, GEARIN              ACCOUNT NO.:  [id number]  MEDICAL RECORD NO.:  [id number]  LOCATION:  1603                         FACILITY:  [facility]  PHYSICIAN:  Almedia Balls. Ranell Patrick, M.D. DATE OF BIRTH:  10-31-1924  DATE OF PROCEDURE: DATE OF DISCHARGE:  04/09/2011                              OPERATIVE REPORT   PREOPERATIVE DIAGNOSIS:  Right displaced subtrochanteric femur fracture.  POSTOPERATIVE DIAGNOSIS:  Right displaced subtrochanteric femur fracture.  PROCEDURE PERFORMED:  Right hip subtrochanteric IM nailing using DePuy VersaNail.  ATTENDING SURGEON:  Malon Kindle, MD  ASSISTANT:  Modesto Charon, [place]  ANESTHESIA:  General anesthesia was used.  ESTIMATED BLOOD LOSS:  200 mL.  FLUID REPLACEMENT:  1200 mL crystalloid.  URINE OUTPUT:  200 mL.  COUNTS:  Instrument count was correct.  COMPLICATIONS.:  None.  PREMEDICATIONS:  Antibiotics were given.  INDICATIONS:  The patient is an 77 year old male who suffered a fall injuring his right hip.  The patient presented with a closed intertrochanteric/ subtrochanteric femur fracture.  The patient did have urosepsis on presentation and failure to thrive as well as potential for some metabolic encephalopathy.  After initial medical workup and clearance, and medical optimization, the patient was cleared for surgery on April 09, 2011.  After counseling the patient and his family regarding the need to perform operative reduction and stabilization of his fracture site to allow for mobilization, they agreed to surgery. Informed consent was obtained.  DESCRIPTION OF PROCEDURE:  After adequate general anesthesia, the patient was positioned on the fracture table with a perineal post.  The left leg was placed in modified-lithotomy position.  Right leg was correctly identified and placed in traction boots.  We then went ahead and positioned the patient's limb with some traction and internal rotation anatomically reducing  the fracture.  This was more than likely an intertrochanteric fracture with subtrochanteric extension.  A DePuy VersaNail with selection for the long nail was chosen to provide maximum stability with a reconstruction fixation up into the head.  After anatomic alignment of the fracture, we went ahead and sterilely prepped and draped the right thigh and leg in the usual manner.  We then draped in the C-arm with a shower curtain and performed a longitudinal incision, after time-out, proximal to the greater trochanter. Dissection down through subcutaneous tissues using the Bovie, we split the tensor fascia lata and then identified the greater trochanter and the starting point for our nail.  We placed our guidepin as visualized on AP and lateral views, across the fracture site and down the canal. We then over-reamed with the initial entry reamer.  Then placed a guidepin across the fracture site and down to the knee.  We measured the length of the prospective nail with a 360-mm nail selected, and due to the size of the patient's canal, we selected a 11-mm nail.  We then reamed with flexible reamers up to a size 13.  We then reduced the DePuy VersaNail for the right femur, which was again 360-mm in length and 11- mm in diameter, across the fracture site with appropriate depth.  We then went ahead and placed our lag screw, again visualizing with the guidepin both in  AP and lateral views.  We had excellent positioning of the lag screw and placed that lag screw after drilling to the subcortical bone.  Once our lag screw was in place and the fracture was still anatomically aligned, we obtained final AP and lateral pictures with the jig off proximally.  We then abducted the leg and then did our distal interlocks x2 with a freehand technique coming to a lateral view with the C-arm with perfect circles.  We did this with freehand technique.  No complications there and then were happy with AP  and lateral views with the locking screws in place.  Final look at the hip showing excellent alignment and appropriate implant position.  We thoroughly irrigated all wounds and closed the proximal wound with layered closure, with #1 Vicryl for the tensor fascia lata, followed by 0 and 2-0 Vicryl layered subcutaneous closure and then staples for skin. And distal locking screw and lag screw incisions also closed with subcutaneous closure with Vicryl and staples.  Sterile compressive bandage was applied.  The patient taken and transported to the recovery room.     Almedia Balls. Ranell Patrick, M.D.     SRN/MEDQ  D:  04/09/2011  T:  04/09/2011  Job:  [id number]  Electronically Signed by Malon Kindle  on 04/09/2011 11:38:15 PM

## 2011-04-10 LAB — BASIC METABOLIC PANEL
Calcium: 7.7 mg/dL — ABNORMAL LOW (ref 8.4–10.5)
GFR calc Af Amer: 51 mL/min — ABNORMAL LOW (ref >60)
GFR calc non Af Amer: 42 mL/min — ABNORMAL LOW (ref >60)
Glucose, Bld: 115 mg/dL — ABNORMAL HIGH (ref 70–99)
Potassium: 3.7 mEq/L (ref 3.5–5.1)
Sodium: 135 mEq/L (ref 135–145)

## 2011-04-10 LAB — CBC
Hemoglobin: 10 g/dL — ABNORMAL LOW (ref 13.0–17.0)
MCH: 32.2 pg (ref 26.0–34.0)
MCHC: 35.5 g/dL (ref 30.0–36.0)
Platelets: 85 10*3/uL — ABNORMAL LOW (ref 150–400)
RBC: 2.42 MIL/uL — ABNORMAL LOW (ref 4.22–5.81)
RDW: 15.2 % (ref 11.5–15.5)
WBC: 8.4 10*3/uL (ref 4.0–10.5)

## 2011-04-10 LAB — PREPARE RBC (CROSSMATCH)

## 2011-04-11 LAB — CBC
Hemoglobin: 9.8 g/dL — ABNORMAL LOW (ref 13.0–17.0)
MCV: 91.7 fL (ref 78.0–100.0)
Platelets: 96 10*3/uL — ABNORMAL LOW (ref 150–400)
RBC: 3.03 MIL/uL — ABNORMAL LOW (ref 4.22–5.81)
WBC: 7.3 10*3/uL (ref 4.0–10.5)

## 2011-04-11 LAB — TYPE AND SCREEN
ABO/RH(D): A NEG
Antibody Screen: NEGATIVE
Unit division: 0

## 2011-04-11 LAB — BASIC METABOLIC PANEL
CO2: 19 mEq/L (ref 19–32)
Chloride: 109 mEq/L (ref 96–112)
Sodium: 137 mEq/L (ref 135–145)

## 2011-04-12 ENCOUNTER — Ambulatory Visit: Payer: Self-pay | Admitting: Internal Medicine

## 2011-04-13 NOTE — Consult Note (Signed)
  [name], [name]              ACCOUNT NO.:  [id number]  MEDICAL RECORD NO.:  [id number]  LOCATION:  1238                         FACILITY:  [facility]  PHYSICIAN:  Jene Every, M.D.    DATE OF BIRTH:  [birth year]  DATE OF CONSULTATION:  04/07/2011 DATE OF DISCHARGE:                                CONSULTATION   CHIEF COMPLAINT:  Right hip pain.  HISTORY:  This is an 77 year old male who has had a hernia surgery and has had a gradual decline in his health since including difficulty with extubation following that particular surgery, in the nursing home, fell apparently onto his right side.  He had acute pain and was seen in the emergency room with a noted hip fracture on the right.  I  was consulted for treatment of that fracture.  He has been overall feeling weak.  He has had urinary problems.  He had no loss of consciousness when he fell.  No fevers or chills, change in bowel or bladder function, pain that awakens him at night, no unexplained recent weight loss in terms of his review of systems.  PAST MEDICAL HISTORY: 1. Hypertension. 2. Gout. 3. Hernia. 4. Prostatic hypertrophy.  SOCIAL HISTORY:  Negative tobacco, negative ETOH.  Lives with his spouse.  ALLERGIES:  None.  MEDICATIONS: 1. Terazosin. 2. Allopurinol. 3. Colchicine. 4. Lisinopril. 5. Avodart. 6. Rapaflo.  PHYSICAL EXAMINATION:  VITAL SIGNS:  Blood pressure is 110/55, pulse 92, respirations 18. HEENT:  He is a somewhat somnolent patient, though arousable. MUSCULOSKELETAL:  His right lower extremity is an internal rotation and flexion.  He has dorsalis pedis and posterior tibial pulses. Dorsiflexion and plantarflexion with severe pain with attempted range of motion of his hip.  Left lower extremity is unremarkable.  Pelvis is stable.  Nontender in the lumbosacral junction, nontender at the cervical spine. PULMONARY:  Clear to auscultation. COR:  Regular rate and rhythm.  X-rays of the hip  demonstrated comminuted peritrochanteric and subtrochanteric hip fracture on the right.  LABS:  Sodium is 125 which is low, creatinine 1.24, bilirubin is to 2.2 which is high.  INR is 1.36.  UA:  Large leukocyte esterase.  His CBC is pending.  IMPRESSION: 1. Right acute subtrochanteric hip fracture. 2. Comorbidities with possible urosepsis. 3. Failure to thrive. 4. Elevated bilirubin. 5. Status post hernia surgery.  PLAN:  I have discussed with he and his wife the eventual open reduction and internal fixation of the right hip following medical clearance.  In the interim, Buck traction of the right lower extremity and neurovascular checks with vital signs will be appropriate.  Following that open reduction and internal fixation, I anticipate partial weightbearing for the first 6 weeks and anticipate 6-10 weeks in terms of healing of the right lower extremity.  My current concern is with his general decline in health, his elevated bilirubin, and his general mental status.  Recommend medical consultation and admission and clearance prior to that.  Appreciate the consult.     Jene Every, M.D.     Cordelia Pen  D:  04/07/2011  T:  04/08/2011  Job:  [id number]  Electronically Signed by Jene Every M.D. on 04/13/2011 10:19:49 AM

## 2011-04-14 ENCOUNTER — Telehealth (INDEPENDENT_AMBULATORY_CARE_PROVIDER_SITE_OTHER): Payer: Self-pay | Admitting: General Surgery

## 2011-04-14 LAB — CULTURE, BLOOD (ROUTINE X 2)
Culture  Setup Time: 201208170142
Culture: NO GROWTH
Culture: NO GROWTH

## 2011-04-19 NOTE — H&P (Signed)
[name], [name]              ACCOUNT NO.:  [id number]  MEDICAL RECORD NO.:  [id number]  LOCATION:  WLED                         FACILITY:  [facility]  PHYSICIAN:  Hartley Barefoot, MD    DATE OF BIRTH:  28-Nov-1924  DATE OF ADMISSION:  04/07/2011 DATE OF DISCHARGE:                             HISTORY & PHYSICAL   CHIEF COMPLAINT:  Fall.  HISTORY OF PRESENT ILLNESS:  This is an 77 year old who presented with a chief complaint of fall.  The daughter at bedside relayed that the patient was weak the morning of admission and he fell in the bathroom. The patient does not remember the episode.  The patient was alone at home. He did not respond and the family asked neighbor to take a look at him and neighbor found him on the floor, it seems that maybe he was unconscious as per wife, but that he woke up immediately.  The patient does recall that he fell backward, he does not remember if he passed out, he thinks that may be he passed out.  He denies any chest pain or shortness of breath.  He denies any chest pain or shortness of breath on ambulation also.  He had a Foley catheter placed a month ago during his hospitalization last month and he was instructed by his urologist to remove the catheter.  The patient removed the catheter and he was having some blood in his urine.  ALLERGIES:  No known drug allergies.  PAST MEDICAL HISTORY: 1. History of BPH. 2. History of gout. 3. History of hypertension. 4. The patient denies any history of heart disease or diabetes.  HOME MEDICATIONS: 1. Avodart 0.5 mg p.o. daily. 2. Rapaflo 8 mg p.o. daily. 3. Allopurinol 100 mg p.o. daily. 4. Colchicine 0.6 mg 3 times a day as needed. 5. Lisinopril 40 mg 0.5 tablets daily. 6. Terazosin 5 mg p.o. daily.  SOCIAL HISTORY:  Denies smoking alcohol or recreational drugs.  He is married, he has 3 children, he is retired.  He was a Occupational psychologist.  He lives at home with his  family.  PHYSICAL EXAMINATION:  VITAL SIGNS:  Blood pressure 110/55, pulse 92, respirations 18, sat 97 on room air.  Blood pressure decreased to 96/50. GENERAL:  The patient lying in bed in no acute distress, sleepy but arousable, appears obtunded. HEENT:  Head traumatic, normocephalic.  Eyes anicteric.  Pupils equal and reactive to light.  Extraocular muscles intact. CARDIOVASCULAR:  S1 and S2, regular rhythm and rate.  No rubs, murmurs, or gallops. LUNGS:  Bilateral good air movement.  Clear to auscultation. ABDOMEN:  Bowel sounds positive, soft, nontender, nondistended.  No rigidity. EXTREMITIES:  No edema. NEUROLOGIC:  The patient is sleepy, arousable, answers some questions, oriented to person and place and time.  He has passive movement of all 4 extremities.  ADMISSION LABORATORY DATA:  UA; too numerous to count white blood cell, positive nitrate.  Troponin 0.00, hemoglobin 10.5, hematocrit 31, platelets 126, AST 35, ALT 19, bilirubin 2.2, alkaline phosphatase 101, sodium 125.  Potassium 3.8, chloride 95, bicarb 18, BUN 20, creatinine 1.2, glucose 113, white blood cell 9.4.  RADIOGRAPHIC STUDIES: 1. Hip, comminuted angulated  medially subtrochanteric right femoral     fracture. 2. Chest x-ray suboptimal inspiration with mild bibasilar atelectasis.     No definite active process.  ASSESSMENT AND PLAN: 1. Possible early sepsis.  The patient with too numerous to count     white blood cells in urine, blood pressure in the 90s to 100 range.     I will check lactic acid.  We will admit the patient to the ICU.     We will continue with IV fluids.  He has a metabolic acidosis with     a bicarb at 18.  We will start broad-spectrum antibiotics to cover     for urinary tract infection and we will also add vancomycin in case     that he has new pneumonia.  He was recently in the hospital     for healthcare associated.  X-ray shows some mild bibasilar     atelectasis, unclear if they  have some underlying infiltrates. 2. Fall, questionable syncope.  We will admit the patient with     telemetry monitor.  Cardiac enzymes x3.  EKG.  I will get a CT head     to rule out bleed.  I will also check a 2-D echo. Might also help     with cardiac evaluation. 3. Hip fracture. Orthopedics was consulted.  We are going to delay at     this time surgery due to active infection, metabolic acidosis, and     active problems.  The patient will need a reassessment and     clearance for surgery when indicated. 4. Encephalopathy, altered to mental status.  This could be secondary     to infection and medication.  The patient received fentanyl in the     emergency department, he received 50 mg IV x2 doses.  He is     arousable, we will get an ABG to rule out acidosis and hypercapnia.     We will monitor closely in the ICU.  Also, please refer to workup     for syncope. 5. Thrombocytopenia possibly secondary to infection and early sepsis.     I will check a DIC panel. 6. Hyperbilirubinemia, mild elevation of bilirubin.  This could be     secondary to dehydration, maybe a component of Gilbert disease.  I     will repeat bilirubin in the morning.  No prior history of liver     disease, but the patient has mildly low albumin.  AST and ALT are     normal.  Depending on bilirubin in the morning, we will proceed     with further workup. 7. Hyponatremia probably secondary to dehydration.  We will continue     with IV fluids.  We will check urine osmolality. 8. For deep vein thrombosis prophylaxis, SCDs at this time due to     thrombocytopenia and hematuria. 9. Hematuria.  The patient had a Foley in place in the emergency     department.  We will treat for infection at this time.     Hartley Barefoot, MD    BR/MEDQ  D:  04/07/2011  T:  04/07/2011  Job:  [id number]  Electronically Signed by Hartley Barefoot MD on 04/19/2011 06:50:11 PM

## 2011-04-21 ENCOUNTER — Encounter (INDEPENDENT_AMBULATORY_CARE_PROVIDER_SITE_OTHER): Payer: Medicare Other | Admitting: General Surgery

## 2011-04-22 NOTE — Discharge Summary (Signed)
[name], [name]              ACCOUNT NO.:  [id number]  MEDICAL RECORD NO.:  [id number]  LOCATION:  1603                         FACILITY:  [facility]  PHYSICIAN:  Marinda Elk, M.D.DATE OF BIRTH:  [birth year]  DATE OF ADMISSION:  04/07/2011 DATE OF DISCHARGE:                        DISCHARGE SUMMARY - REFERRING   DISCHARGE DIAGNOSES: 1. Syncope most likely multifactorial secondary to urinary tract     infection and medication. 2. Klebsiella urinary tract infection. 3. Acute metabolic encephalopathy. 4. Hyponatremia. 5. Metabolic acidosis. 6. Right subtrochanteric femur fracture. 7. Hypertension. 8. Acute blood loss anemia. 9. Chronic kidney disease. 10.Benign prostatic hyperplasia.  DISCHARGE MEDICATIONS: 1. Ciprofloxacin 500 mg p.o. b.i.d. 2. Flomax 0.4 mg daily. 3. Vicodin 1 to 2 tablets q.4 h. p.r.n. 4. MiraLax 17 g daily. 5. Allopurinol 100 mg daily. 6. Avodart 0.5 mg capsule daily. 7. Colchicine 0.6 mg 1 tablet t.i.d. p.r.n. 8. Terazosin 5 mg p.o. daily. 9. Stop lisinopril. 10.Stop Rapaflo.  PROCEDURES PERFORMED: 1. Right nailing of the subtrochanteric fracture. 2. Chest x-ray shows suboptimal inspiration, mild basilar atelectasis. 3. Right hip x-ray showed comminuted, angulated subtrochanteric right     femur fracture.  CT head showed mild chronic ischemic changes, mild     left frontal ethmoidal sinusitis. 4. Hip x-ray shows right femoral dynamic nailing fixation.  BRIEF ADMITTING HISTORY AND PHYSICAL:  This is an 77 year old whose only chief complaint is fall.  Daughter at bedside related that he has been weak all morning and he fell in the bathroom.  The patient does not remember this episode.  He fell by himself.  There was loss of consciousness.  The family responded and the family called the neighbor to go in and see him.  It seems that maybe he was unconscious as per her wife.  Please see related dictation from April 07, 2011 for  further details.  LABORATORY DATA:  On admission, troponin 0.00.  His sodium was 126, potassium 3.9, chloride 99, glucose of 112, BUN of 20, and creatinine of 1.4.  His UA was positive for large blood, ketones, nitrites, and leukocytes, too numerous to count white blood cells and many bacteria. His PT was 17 and INR 1.3.  His PTT was 36.  His white count was 9.4, hemoglobin of 9.4, MCV of 94, platelet count of 79, and ANC of 8.6.  IMAGING:  As above.  BRIEF HOSPITAL COURSE: 1. Syncope.  He was recently started on Rapaflo.  He is also on     terazosin.  Both of these agents and the BPH can cause orthostatic     hypotension.  These were held on admission.  A Foley was placed.     He also had urinary tract infection, which is probably contributing     to his orthostasis and dehydration.  He was given IV fluids.  This     resolved.  His Rapaflo was discontinued.  He was started on Flomax     and he will continue terazosin. 2. UTI.  Urine cultures were done that showed klebsiella UTI.     Initially, he was started on Zosyn.  Urine cultures came back.  It     showed klebsiella sensitive to Cipro.  He was changed to p.o.,     which he will continue for a total of 7 days as an outpatient. 3. Acute metabolic encephalopathy probably secondary to     multifactorial. 4. Hyponatremia and urinary tract infection.  This resolved with     treatment of the above. 5. Thrombocytopenia, currently stable.  No changes were made. 6. Hyponatremia.  This is probably secondary to dehydration.  He was     given IV fluids and this resolved. 7. Acute blood loss anemia after surgery that his hemoglobin dropped     to 7.8.  The patient was feeling weak, so it was discussed with the     orthopedic doctors to go ahead and transfuse him.  His followup     hemoglobin was 10.  So has remained stable. 8. Hypertension.  His blood pressure medications were held on     admission due to low blood pressure.  His  lisinopril has been     stopped.  This will be resuming 2 weeks when his blood pressure can     tolerate and at the day of discharge, his blood pressure is 104/58     without any blood pressure medication, so his Benicar will be hold     and stop in 2 weeks by his primary care doctor. 9. Chronic kidney disease.  His baseline creatinine is 1.8 to 1.2.  At     the day of discharge, it is 1.6.  We will monitor as an outpatient. 10.BPH is probably contributing to his syncope.  His Rapaflo was     stopped.  Flomax was started.  He will continue on terazosin.  If     he has any signs of orthostasis, the terazosin will be recommended     to be stopped.  Vitals on day of discharge shows a temperature of 98, pulse 79, respiration of 17, blood pressure 104/58, before this it was 135/71.  He was saturating at 97% on room air.  LABORATORY DATA:  Labs on day of discharge shows sodium of 137, potassium 3.5, chloride 109, bicarbonate of 19, glucose of 120, BUN of 20, creatinine of 1.6, and calcium of 7.6.  His white count of 7.3, hemoglobin of 9.8, and platelet count of 96.  DISPOSITION:  The patient will follow up with his primary care doctor in 2 weeks here.  We will check on his blood pressure, assume it is greater than 140/80.  We will go ahead and start him back on his Benicar if his blood pressure can tolerate and his BMET shows a creatinine back to baseline.     Marinda Elk, M.D.     AF/MEDQ  D:  04/11/2011  T:  04/11/2011  Job:  [id number]  cc:   Primary Care Doctor.  Electronically Signed by Marinda Elk M.D. on 04/22/2011 04:53:13 PM

## 2011-06-16 ENCOUNTER — Ambulatory Visit: Payer: Medicare Other | Admitting: Internal Medicine

## 2011-10-05 ENCOUNTER — Other Ambulatory Visit: Payer: Self-pay | Admitting: Orthopedic Surgery

## 2011-10-05 DIAGNOSIS — R52 Pain, unspecified: Secondary | ICD-10-CM

## 2011-10-06 ENCOUNTER — Ambulatory Visit
Admission: RE | Admit: 2011-10-06 | Discharge: 2011-10-06 | Disposition: A | Discharge status: Home / Self Care | Payer: Medicare Other | Source: Ambulatory Visit | Attending: Orthopedic Surgery | Admitting: Orthopedic Surgery

## 2011-10-06 DIAGNOSIS — R52 Pain, unspecified: Secondary | ICD-10-CM

## 2011-11-03 ENCOUNTER — Ambulatory Visit (INDEPENDENT_AMBULATORY_CARE_PROVIDER_SITE_OTHER): Payer: Medicare Other | Admitting: Internal Medicine

## 2011-11-03 ENCOUNTER — Telehealth: Payer: Self-pay | Admitting: Internal Medicine

## 2011-11-03 ENCOUNTER — Encounter: Payer: Self-pay | Admitting: Internal Medicine

## 2011-11-03 VITALS — BP 120/70 | Temp 97.5°F | Wt 137.0 lb

## 2011-11-03 DIAGNOSIS — F3289 Other specified depressive episodes: Secondary | ICD-10-CM

## 2011-11-03 DIAGNOSIS — F329 Major depressive disorder, single episode, unspecified: Secondary | ICD-10-CM

## 2011-11-03 DIAGNOSIS — I10 Essential (primary) hypertension: Secondary | ICD-10-CM

## 2011-11-03 DIAGNOSIS — L03039 Cellulitis of unspecified toe: Secondary | ICD-10-CM

## 2011-11-03 MED ORDER — SERTRALINE HCL 25 MG PO TABS: 25.0000 mg | ORAL_TABLET | Freq: Every day | ORAL | Status: DC

## 2011-11-03 MED ORDER — CEPHALEXIN 500 MG PO CAPS: 500.0000 mg | ORAL_CAPSULE | Freq: Three times a day (TID) | ORAL | Status: DC

## 2011-11-03 NOTE — Telephone Encounter (Signed)
Pt daughter went to [facility] in [place] at 3:30pm to pick up meds and was told by pharmacist that no meds had been recvd from pts pcp. Pls call meds directly to pharmacist at [phone]. cephALEXin (KEFLEX) 500 MG capsule and sertraline (ZOLOFT) 25 MG tablet

## 2011-11-03 NOTE — Patient Instructions (Signed)
Clean  right second toe daily and wrap  Recheck 6 weeks

## 2011-11-03 NOTE — Progress Notes (Signed)
  Subjective:    [name], male    DOB: March 26, 1925, 78 y.o.   MRN: [id number]  HPI 78 year old patient who has a history of gout renal insufficiency and hypertension. He presents with a several-day history of pain and redness involving his right second toe. He is status post right total hip replacement surgery in August of last year with still is causing pain and some unsteady gait. Due to his poor progress he also has become much more depressed. This has affected his general sense of well-being as well as causing a sleep disorder. He is accompanied by a daughter who also feels he is quite depressed   Review of Systems  Skin: Positive for rash and wound.  Psychiatric/Behavioral: Positive for sleep disturbance and dysphoric mood.       Objective:   Physical Exam  Constitutional: He is oriented to person, place, and time. He appears well-developed.  HENT:  Head: Normocephalic.  Right Ear: External ear normal.  Left Ear: External ear normal.  Eyes: Conjunctivae and EOM are normal.  Neck: Normal range of motion.  Cardiovascular: Normal rate and normal heart sounds.   Pulmonary/Chest: Breath sounds normal.  Abdominal: Bowel sounds are normal.  Musculoskeletal: Normal range of motion. He exhibits no edema and no tenderness.  Neurological: He is alert and oriented to person, place, and time.  Skin:       The right second toe was swollen erythematous. There is a very superficial blister on the plantar surface of the toe  Psychiatric: He has a normal mood and affect. His behavior is normal. Judgment and thought content normal.          Assessment & Plan:   Hypertension well controlled Saline his right second toe. We'll place on antibiotic therapy. Local wound care discussed Depression. We'll place on sertraline 25 mg daily. Recheck in 6 weeks

## 2011-11-04 NOTE — Telephone Encounter (Signed)
This were sent

## 2011-12-11 ENCOUNTER — Emergency Department (INDEPENDENT_AMBULATORY_CARE_PROVIDER_SITE_OTHER): Payer: Medicare Other

## 2011-12-11 ENCOUNTER — Emergency Department (HOSPITAL_BASED_OUTPATIENT_CLINIC_OR_DEPARTMENT_OTHER)
Admission: EM | Admit: 2011-12-11 | Discharge: 2011-12-11 | Disposition: A | Discharge status: Home / Self Care | Payer: Medicare Other | Attending: Emergency Medicine | Admitting: Emergency Medicine

## 2011-12-11 ENCOUNTER — Encounter (HOSPITAL_BASED_OUTPATIENT_CLINIC_OR_DEPARTMENT_OTHER): Payer: Self-pay | Admitting: Emergency Medicine

## 2011-12-11 DIAGNOSIS — W19XXXA Unspecified fall, initial encounter: Secondary | ICD-10-CM

## 2011-12-11 DIAGNOSIS — M25559 Pain in unspecified hip: Secondary | ICD-10-CM | POA: Insufficient documentation

## 2011-12-11 DIAGNOSIS — S20229A Contusion of unspecified back wall of thorax, initial encounter: Secondary | ICD-10-CM | POA: Insufficient documentation

## 2011-12-11 DIAGNOSIS — Z96649 Presence of unspecified artificial hip joint: Secondary | ICD-10-CM | POA: Insufficient documentation

## 2011-12-11 DIAGNOSIS — M25519 Pain in unspecified shoulder: Secondary | ICD-10-CM | POA: Insufficient documentation

## 2011-12-11 DIAGNOSIS — N400 Enlarged prostate without lower urinary tract symptoms: Secondary | ICD-10-CM | POA: Insufficient documentation

## 2011-12-11 DIAGNOSIS — R296 Repeated falls: Secondary | ICD-10-CM | POA: Insufficient documentation

## 2011-12-11 DIAGNOSIS — I10 Essential (primary) hypertension: Secondary | ICD-10-CM | POA: Insufficient documentation

## 2011-12-11 DIAGNOSIS — M109 Gout, unspecified: Secondary | ICD-10-CM | POA: Insufficient documentation

## 2011-12-11 HISTORY — DX: Disorder of kidney and ureter, unspecified: N28.9

## 2011-12-11 NOTE — ED Provider Notes (Signed)
History     CSN: [id number]  Arrival date & time 12/11/11  1015   First MD Initiated Contact with Patient 12/11/11 1105      Chief Complaint  Patient presents with  . Fall  . Head Injury  . Shoulder Injury    (Consider location/radiation/quality/duration/timing/severity/associated sxs/prior treatment) HPI Pt fell last night while taking off sweat shirt. Pt states he lost his balance. Since his R hip has felt stiff but no pain. Ambulatory. No weakness or numbness. Pt hit his head as well but w/o LOC. No new neck pain. Mild pain over L scapula with contusion. ROM of L arm. No CP, SOB, dizziness, N/V, abd pain at any point.  Past Medical History  Diagnosis Date  . Gout   . Insomnia   . Hypertension   . BPH (benign prostatic hyperplasia)   . Kidney disorder     Past Surgical History  Procedure Date  . Cataract extraction   . Cholecystectomy   . Hernia repair 02/22/11    right  . Total hip arthroplasty   . Joint replacement     Family History  Problem Relation Age of Onset  . Heart failure Mother   . Heart failure Father   . Diabetes Neg Hx     History  Substance Use Topics  . Smoking status: Never Smoker   . Smokeless tobacco: Never Used  . Alcohol Use: No      Review of Systems  Constitutional: Negative for fever, chills and fatigue.  HENT: Negative for neck pain and neck stiffness.   Eyes: Negative for visual disturbance.  Respiratory: Negative for shortness of breath.   Cardiovascular: Negative for chest pain, palpitations and leg swelling.  Gastrointestinal: Negative for nausea, vomiting and abdominal pain.  Musculoskeletal: Positive for back pain. Negative for joint swelling.  Skin: Positive for wound. Negative for pallor and rash.  Neurological: Negative for dizziness, syncope, weakness, numbness and headaches.    Allergies  Nsaids  Home Medications   Current Outpatient Rx  Name Route Sig Dispense Refill  . ACETAMINOPHEN 325 MG PO TABS Oral  Take 650 mg by mouth every 6 (six) hours as needed.    Marland Kitchen TAMSULOSIN HCL 0.4 MG PO CAPS Oral Take 0.4 mg by mouth daily after supper.    . ALLOPURINOL 100 MG PO TABS Oral Take 1 tablet (100 mg total) by mouth daily. 90 tablet 3  . ASPIRIN 81 MG PO TABS Oral Take 81 mg by mouth daily.      . COLCHICINE 0.6 MG PO TABS Oral Take 1 tablet (0.6 mg total) by mouth 3 (three) times daily as needed (gout flare). 30 tablet 2  . DUTASTERIDE 0.5 MG PO CAPS Oral Take 0.5 mg by mouth daily.      Marland Kitchen LISINOPRIL 40 MG PO TABS Oral Take 40 mg by mouth daily.      . SERTRALINE HCL 25 MG PO TABS Oral Take 1 tablet (25 mg total) by mouth daily. 30 tablet 2  . RAPAFLO PO Oral Take by mouth daily.      Marland Kitchen TERAZOSIN HCL 5 MG PO CAPS Oral Take 5 mg by mouth at bedtime.      . TRAMADOL HCL 50 MG PO TABS Oral Take 50 mg by mouth every 6 (six) hours as needed.      BP 141/60  Pulse 88  Temp(Src) 98.3 F (36.8 C) (Oral)  Resp 16  SpO2 99%  Physical Exam  Nursing note and vitals reviewed.  Constitutional: He is oriented to person, place, and time. He appears well-developed and well-nourished. No distress.  HENT:  Head: Normocephalic and atraumatic.  Mouth/Throat: Oropharynx is clear and moist.       No evidence of any trauma to scalp   Eyes: EOM are normal. Pupils are equal, round, and reactive to light.  Neck: Normal range of motion. Neck supple.       No post mid line cervical TTP. FROM  Cardiovascular: Normal rate and regular rhythm.   Pulmonary/Chest: Effort normal and breath sounds normal. No respiratory distress. He has no wheezes. He has no rales.  Abdominal: Soft. Bowel sounds are normal. There is no tenderness. There is no rebound and no guarding.  Musculoskeletal: Normal range of motion. He exhibits no edema and no tenderness.       FROM of R hip without pain. No RLE shortening rotation  or evidence of trauma  Neurological: He is alert and oriented to person, place, and time.       5/5 motor, sensation  intact  Skin: Skin is warm and dry. No rash noted. No erythema.       Small contusion over L scapula  Psychiatric: He has a normal mood and affect. His behavior is normal.    ED Course  Procedures (including critical care time)  Labs Reviewed - No data to display Dg Hip Complete Right  12/11/2011  *RADIOLOGY REPORT*  Clinical Data: Post fall, now with right hip pain.  RIGHT HIP - COMPLETE 2+ VIEW  Comparison: Right hip CT - 10/06/2011  Findings:  Post femoral rod and screw fixation of comminuted proximal right metadiaphysis femur fracture.  There is persistent lucency about the fracture site with minimal callus formation about the fracture site.  No definite new acute fracture.  No definite evidence of hardware failure or loosening.  Limited visualization of the pelvis is normal.  Limited visualization of lumbar spine suggests degenerative change.  IMPRESSION: Interval callous formation about previously identified comminuted proximal right femur fracture without evidence of hardware failure or loosening.  No definite acute fracture.  Original Report Authenticated By: Waynard Reeds, M.D.   Dg Shoulder Left  12/11/2011  *RADIOLOGY REPORT*  Clinical Data: Post fall onto left shoulder.  LEFT SHOULDER - 2+ VIEW  Comparison: A chest radiograph - 02/17/2011; 10/02/2008  Findings:  No fracture or dislocation.  Indeterminate cortically based lesion involving the posterior lateral aspect of the proximal humeral diaphysis is grossly unchanged compared to remote chest radiographs performed 01/2011 and 09/2008 and thus is considered to be of benign etiology.  The acromioclavicular and glenohumeral joint spaces are preserved.  No definite evidence of calcific tendonitis. Limited visualization of adjacent thorax suggests basilar atelectasis.  Regional soft tissues are normal.  IMPRESSION:  No fracture or dislocation.  Original Report Authenticated By: Waynard Reeds, M.D.     1. Fall   2. Back contusion        MDM  Unlikely any fractures present. Will check Xray and d/c home.         Loren Racer, MD 12/11/11 1320

## 2011-12-11 NOTE — ED Notes (Signed)
Pt in the bathroom, taking off sweatshirt.  Pt having some difficulty and fell in bathroom.  No dizziness.  Pt hit shoulders and head during fall but no LOC.  Pt concerned about right hip and thigh since his surgery and that his neck pops when he turns it.

## 2011-12-11 NOTE — Discharge Instructions (Signed)

## 2011-12-15 ENCOUNTER — Encounter: Payer: Self-pay | Admitting: Internal Medicine

## 2011-12-15 ENCOUNTER — Ambulatory Visit (INDEPENDENT_AMBULATORY_CARE_PROVIDER_SITE_OTHER): Payer: Medicare Other | Admitting: Internal Medicine

## 2011-12-15 ENCOUNTER — Other Ambulatory Visit: Payer: Self-pay | Admitting: Internal Medicine

## 2011-12-15 VITALS — BP 120/70 | Temp 97.7°F | Wt 136.0 lb

## 2011-12-15 DIAGNOSIS — Z23 Encounter for immunization: Secondary | ICD-10-CM

## 2011-12-15 DIAGNOSIS — I10 Essential (primary) hypertension: Secondary | ICD-10-CM

## 2011-12-15 DIAGNOSIS — G4700 Insomnia, unspecified: Secondary | ICD-10-CM

## 2011-12-15 DIAGNOSIS — M109 Gout, unspecified: Secondary | ICD-10-CM

## 2011-12-15 DIAGNOSIS — Z0000 Encounter for general adult medical examination without abnormal findings: Secondary | ICD-10-CM

## 2011-12-15 MED ORDER — TRAZODONE 25 MG HALF TABLET: 50.0000 mg | ORAL_TABLET | Freq: Every day | ORAL | Status: DC

## 2011-12-15 NOTE — Telephone Encounter (Signed)
Spoke with [name] at [facility] - dr. Ranell Patrick rx'd yesterday and pt has not picked up - instructed to dc that rx and let dr. Vernon Prey rx stand as correct one since we are pcp and managing multiple meds for him

## 2011-12-15 NOTE — Telephone Encounter (Signed)
Pt was prescribed trazodone by another MD yesterday. [facility] received [facility]script for same medicaton from Dr Kirtland Bouchard. Please advise

## 2011-12-15 NOTE — Patient Instructions (Signed)
Limit your sodium (Salt) intake  Return in 3 months for follow-up   

## 2011-12-15 NOTE — Progress Notes (Signed)
  Subjective:    [name], male    DOB: [birth year], 78 y.o.   MRN: [id number]  HPI  78 year old patient who is seen today for followup. He was seen here 6 weeks ago and thought to have some mild depression after discussion it was elected to place on sertraline 25 mg daily. He has discontinued his medication stating that it made him more restless and irritable. His chief complaint today is insomnia. This has been a chronic issue in the past. He also states that he has had a difficult time acclimating to living in [place]. He has relocated from [place] but this was a number of years ago. He is treated hypertension which has been stable    Review of Systems  Constitutional: Negative for fever, chills, appetite change and fatigue.  HENT: Negative for hearing loss, ear pain, congestion, sore throat, trouble swallowing, neck stiffness, dental problem, voice change and tinnitus.   Eyes: Negative for pain, discharge and visual disturbance.  Respiratory: Negative for cough, chest tightness, wheezing and stridor.   Cardiovascular: Negative for chest pain, palpitations and leg swelling.  Gastrointestinal: Negative for nausea, vomiting, abdominal pain, diarrhea, constipation, blood in stool and abdominal distention.  Genitourinary: Negative for urgency, hematuria, flank pain, discharge, difficulty urinating and genital sores.  Musculoskeletal: Positive for gait problem. Negative for myalgias, back pain, joint swelling and arthralgias.  Skin: Negative for rash.  Neurological: Negative for dizziness, syncope, speech difficulty, weakness, numbness and headaches.  Hematological: Negative for adenopathy. Does not bruise/bleed easily.  Psychiatric/Behavioral: Positive for sleep disturbance and decreased concentration. Negative for behavioral problems and dysphoric mood. The patient is not nervous/anxious.        Objective:   Physical Exam  Constitutional: He is oriented to person,  place, and time. He appears well-developed.       Blood pressure well controlled  HENT:  Head: Normocephalic.  Right Ear: External ear normal.  Left Ear: External ear normal.  Eyes: Conjunctivae and EOM are normal.  Neck: Normal range of motion.  Cardiovascular: Normal rate and normal heart sounds.   Pulmonary/Chest: Breath sounds normal.  Abdominal: Bowel sounds are normal.  Musculoskeletal: Normal range of motion. He exhibits no edema and no tenderness.       His right second toe was somewhat deformed and rotated he had a healing blister involving the plantar aspect of the toe that was clean there is some erythema involving the dorsal aspect of the toe which was improved  Neurological: He is alert and oriented to person, place, and time.  Psychiatric: He has a normal mood and affect. His behavior is normal.          Assessment & Plan:   Insomnia. We'll treat more aggressively. Hopefully will improve  dysphoric mood. We'll hold off on further antidepressants at this time Hypertension stable

## 2012-01-18 ENCOUNTER — Other Ambulatory Visit: Payer: Self-pay | Admitting: Internal Medicine

## 2012-01-19 NOTE — Telephone Encounter (Signed)
Please advise 

## 2012-01-19 NOTE — Telephone Encounter (Signed)
done

## 2012-01-19 NOTE — Telephone Encounter (Signed)
Ok  ROV if there is not prompt clinical improvement

## 2012-01-30 ENCOUNTER — Other Ambulatory Visit: Payer: Self-pay

## 2012-01-30 MED ORDER — ALLOPURINOL 100 MG PO TABS: 100.0000 mg | ORAL_TABLET | Freq: Every day | ORAL | Status: DC

## 2012-02-13 ENCOUNTER — Encounter: Payer: Self-pay | Admitting: Internal Medicine

## 2012-02-13 ENCOUNTER — Ambulatory Visit (INDEPENDENT_AMBULATORY_CARE_PROVIDER_SITE_OTHER): Payer: Medicare Other | Admitting: Internal Medicine

## 2012-02-13 VITALS — BP 104/64 | Temp 97.9°F | Wt 134.0 lb

## 2012-02-13 DIAGNOSIS — N400 Enlarged prostate without lower urinary tract symptoms: Secondary | ICD-10-CM

## 2012-02-13 DIAGNOSIS — I10 Essential (primary) hypertension: Secondary | ICD-10-CM

## 2012-02-13 DIAGNOSIS — G4700 Insomnia, unspecified: Secondary | ICD-10-CM

## 2012-02-13 MED ORDER — ZOLPIDEM TARTRATE 5 MG PO TABS: 5.0000 mg | ORAL_TABLET | Freq: Every evening | ORAL | Status: DC | PRN

## 2012-02-13 NOTE — Patient Instructions (Addendum)
Limit your sodium (Salt) intake  Return in 3 months for follow-up   

## 2012-02-13 NOTE — Progress Notes (Signed)
  Subjective:    [name], male    DOB: 22-Jul-1925, 78 y.o.   MRN: [id number]  HPI  78 year old patient who is seen today for followup. The patient has had some insomnia and also some concerns about depression. He was initially tried on low-dose sertraline which he did not tolerate well. He states this made him feel overactive. He next was given a trial  of trazodone 25 mg at bedtime. He again states this made him overactive and did not assist with sleep. He states he feels poorly during the day that he attributes to possible depression but again he is not sleeping well    Review of Systems  Constitutional: Negative for fever, chills, appetite change and fatigue.  HENT: Negative for hearing loss, ear pain, congestion, sore throat, trouble swallowing, neck stiffness, dental problem, voice change and tinnitus.   Eyes: Negative for pain, discharge and visual disturbance.  Respiratory: Negative for cough, chest tightness, wheezing and stridor.   Cardiovascular: Negative for chest pain, palpitations and leg swelling.  Gastrointestinal: Negative for nausea, vomiting, abdominal pain, diarrhea, constipation, blood in stool and abdominal distention.  Genitourinary: Negative for urgency, hematuria, flank pain, discharge, difficulty urinating and genital sores.  Musculoskeletal: Negative for myalgias, back pain, joint swelling, arthralgias and gait problem.  Skin: Negative for rash.  Neurological: Negative for dizziness, syncope, speech difficulty, weakness, numbness and headaches.  Hematological: Negative for adenopathy. Does not bruise/bleed easily.  Psychiatric/Behavioral: Positive for disturbed wake/sleep cycle and dysphoric mood. Negative for behavioral problems. The patient is not nervous/anxious.        Objective:   Physical Exam  Constitutional: He is oriented to person, place, and time. He appears well-developed.  HENT:  Head: Normocephalic.  Right Ear: External ear  normal.  Left Ear: External ear normal.  Eyes: Conjunctivae and EOM are normal.  Neck: Normal range of motion.  Cardiovascular: Normal rate and normal heart sounds.   Pulmonary/Chest: Breath sounds normal.  Abdominal: Bowel sounds are normal.  Musculoskeletal: Normal range of motion. He exhibits no edema and no tenderness.  Neurological: He is alert and oriented to person, place, and time.  Skin:       A consult present involving the plantar aspect of his right second toe  Psychiatric: He has a normal mood and affect. His behavior is normal.          Assessment & Plan:   Insomnia. He may feel better throughout the day if he has a better night sleep. We'll try Ambien 5 Hypertension stable

## 2012-03-14 ENCOUNTER — Ambulatory Visit: Payer: Medicare Other | Admitting: Internal Medicine

## 2012-03-15 ENCOUNTER — Ambulatory Visit: Payer: Medicare Other | Admitting: Internal Medicine

## 2012-04-10 ENCOUNTER — Other Ambulatory Visit: Payer: Self-pay

## 2012-04-10 MED ORDER — ZOLPIDEM TARTRATE 5 MG PO TABS: 5.0000 mg | ORAL_TABLET | Freq: Every evening | ORAL | Status: DC | PRN

## 2012-04-10 NOTE — Telephone Encounter (Signed)
done

## 2012-08-01 ENCOUNTER — Other Ambulatory Visit: Payer: Self-pay | Admitting: Internal Medicine

## 2012-09-04 ENCOUNTER — Encounter (HOSPITAL_BASED_OUTPATIENT_CLINIC_OR_DEPARTMENT_OTHER): Payer: Self-pay

## 2012-09-04 ENCOUNTER — Telehealth: Payer: Self-pay | Admitting: Internal Medicine

## 2012-09-04 ENCOUNTER — Emergency Department (HOSPITAL_BASED_OUTPATIENT_CLINIC_OR_DEPARTMENT_OTHER)
Admission: EM | Admit: 2012-09-04 | Discharge: 2012-09-04 | Disposition: A | Discharge status: Home / Self Care | Payer: Medicare Other | Attending: Emergency Medicine | Admitting: Emergency Medicine

## 2012-09-04 ENCOUNTER — Emergency Department (HOSPITAL_BASED_OUTPATIENT_CLINIC_OR_DEPARTMENT_OTHER): Payer: Medicare Other

## 2012-09-04 DIAGNOSIS — N400 Enlarged prostate without lower urinary tract symptoms: Secondary | ICD-10-CM | POA: Insufficient documentation

## 2012-09-04 DIAGNOSIS — Z87448 Personal history of other diseases of urinary system: Secondary | ICD-10-CM | POA: Insufficient documentation

## 2012-09-04 DIAGNOSIS — Y929 Unspecified place or not applicable: Secondary | ICD-10-CM | POA: Insufficient documentation

## 2012-09-04 DIAGNOSIS — S0990XA Unspecified injury of head, initial encounter: Secondary | ICD-10-CM | POA: Insufficient documentation

## 2012-09-04 DIAGNOSIS — S0003XA Contusion of scalp, initial encounter: Secondary | ICD-10-CM | POA: Insufficient documentation

## 2012-09-04 DIAGNOSIS — I10 Essential (primary) hypertension: Secondary | ICD-10-CM | POA: Insufficient documentation

## 2012-09-04 DIAGNOSIS — Y939 Activity, unspecified: Secondary | ICD-10-CM | POA: Insufficient documentation

## 2012-09-04 DIAGNOSIS — S81009A Unspecified open wound, unspecified knee, initial encounter: Secondary | ICD-10-CM | POA: Insufficient documentation

## 2012-09-04 DIAGNOSIS — W1809XA Striking against other object with subsequent fall, initial encounter: Secondary | ICD-10-CM | POA: Insufficient documentation

## 2012-09-04 DIAGNOSIS — Z87798 Personal history of other (corrected) congenital malformations: Secondary | ICD-10-CM | POA: Insufficient documentation

## 2012-09-04 DIAGNOSIS — T148XXA Other injury of unspecified body region, initial encounter: Secondary | ICD-10-CM

## 2012-09-04 DIAGNOSIS — Z79899 Other long term (current) drug therapy: Secondary | ICD-10-CM | POA: Insufficient documentation

## 2012-09-04 DIAGNOSIS — S0083XA Contusion of other part of head, initial encounter: Secondary | ICD-10-CM | POA: Insufficient documentation

## 2012-09-04 DIAGNOSIS — S81819A Laceration without foreign body, unspecified lower leg, initial encounter: Secondary | ICD-10-CM

## 2012-09-04 NOTE — Telephone Encounter (Signed)
Patient Information:  Caller Name: Andrey Campanile  Phone: [phone]  Patient: [name], [name]  Gender: Male  DOB: 09-29-1924  Age: 79 Years  PCP: Eleonore Chiquito [facility])  Office Follow Up:  Does the office need to follow up with this patient?: Yes  Instructions For The Office: Please call daughter back regarding working patient in for appointment or sending to ED.  RN Note:  Appointments full in the office. Please call daughter of patient and advise if you can work an appointment in for the patient or if she needs to take him to the ED at this time. Thanks.  Symptoms  Reason For Call & Symptoms: Reports patient is sleeping more than normal this afternoon and that he is not acting quite like normal today. Says he is "a little more groggy than usual." Reports he took an Ambien Sunday night and fell Monday morning. There was a purple area on his head that is the size of his palm.  Reviewed Health History In EMR: Yes  Reviewed Medications In EMR: Yes  Reviewed Allergies In EMR: Yes  Reviewed Surgeries / Procedures: Yes  Date of Onset of Symptoms: 09/04/2012  Guideline(s) Used:  Head Injury  Disposition Per Guideline:   Go to ED Now (or to Office with PCP Approval)  Reason For Disposition Reached:   Large swelling or bruise and size > palm of person's hand  Advice Given:  N/A

## 2012-09-04 NOTE — Telephone Encounter (Signed)
Called pt's daughter Andrey Campanile, told her need to take pt to the ED to be evaluated. [name] verbalized understanding.  Dr. Amador Cunas made aware.

## 2012-09-04 NOTE — ED Provider Notes (Signed)
History     CSN: [id number]  Arrival date & time 09/04/12  1651   First MD Initiated Contact with Patient 09/04/12 1804      Chief Complaint  Patient presents with  . Fall  . Head Injury    (Consider location/radiation/quality/duration/timing/severity/associated sxs/prior treatment) HPI Comments: Pt states that he took [place] before bed last night and he fell and hit his head:pt family states that he has been sleepy all day today and that he has a bruise to the left forehead:pt denies [id number] states that this is the third time he has fallen with ambien:daughter states that she is taking the [place] away from him:pt states that he is walking at his baseline which is with a pain:pt states that he has a skin tear to the left lower leg:pt states tetanus is utd  Patient is a 79 y.o. male presenting with fall. The history is provided by the patient, the spouse and a relative.  Fall The accident occurred 12 to 24 hours ago. The pain is mild. He was ambulatory at the scene. There was no entrapment after the fall. There was no drug use involved in the accident. There was no alcohol use involved in the accident. Pertinent negatives include no visual change, no vomiting and no loss of consciousness.    Past Medical History  Diagnosis Date  . Gout   . Insomnia   . Hypertension   . BPH (benign prostatic hyperplasia)   . Kidney disorder     Past Surgical History  Procedure Date  . Cataract extraction   . Cholecystectomy   . Hernia repair 02/22/11    right  . Total hip arthroplasty   . Joint replacement     Family History  Problem Relation Age of Onset  . Heart failure Mother   . Heart failure Father   . Diabetes Neg Hx     History  Substance Use Topics  . Smoking status: Never Smoker   . Smokeless tobacco: Never Used  . Alcohol Use: No      Review of Systems  Constitutional: Negative.   Respiratory: Negative.   Cardiovascular: Negative.   Gastrointestinal: Negative  for vomiting.  Neurological: Negative for loss of consciousness.    Allergies  Nsaids  Home Medications   Current Outpatient Rx  Name  Route  Sig  Dispense  Refill  . ACETAMINOPHEN 325 MG PO TABS   Oral   Take 650 mg by mouth every 6 (six) hours as needed.         . ALLOPURINOL 100 MG PO TABS   Oral   Take 1 tablet (100 mg total) by mouth daily.   90 tablet   3   . COLCHICINE 0.6 MG PO TABS   Oral   Take 0.6 mg by mouth 3 (three) times daily as needed.         Marland Kitchen TAMSULOSIN HCL 0.4 MG PO CAPS   Oral   Take 0.4 mg by mouth daily after supper.         . TERAZOSIN HCL 5 MG PO CAPS   Oral   Take 5 mg by mouth at bedtime.           . TRAMADOL HCL 50 MG PO TABS   Oral   Take 50 mg by mouth every 6 (six) hours as needed.         Marland Kitchen ZOLPIDEM TARTRATE 5 MG PO TABS   Oral   Take 1 tablet (5 mg  total) by mouth at bedtime as needed for sleep.   15 tablet   1     BP 178/76  Pulse 93  Temp 98.6 F (37 C) (Oral)  Resp 16  Ht 5\' 6"  (1.676 m)  Wt 136 lb (61.689 kg)  BMI 21.95 kg/m2  SpO2 98%  Physical Exam  Nursing note and vitals reviewed. Constitutional: He is oriented to person, place, and time. He appears well-developed and well-nourished.  HENT:  Head: Normocephalic and atraumatic.       Pt has bruising to the left forehead  Eyes: Conjunctivae normal and EOM are normal. Pupils are equal, round, and reactive to light.  Neck: Normal range of motion. Neck supple.  Cardiovascular: Normal rate and regular rhythm.   Pulmonary/Chest: Effort normal and breath sounds normal.  Musculoskeletal: Normal range of motion.  Neurological: He is alert and oriented to person, place, and time.  Skin:       Pt has a skin tear to the left lateral lower leg  Psychiatric: He has a normal mood and affect.    ED Course  LACERATION REPAIR Performed by: Teressa Lower Authorized by: Teressa Lower Consent given by: patient Patient identity confirmed: verbally with  patient Time out: Immediately prior to procedure a "time out" was called to verify the correct patient, procedure, equipment, support staff and site/side marked as required. Body area: lower extremity Location details: left lower leg Laceration length: 3 cm Skin closure: Steri-Strips Patient tolerance: Patient tolerated the procedure well with no immediate complications. Comments: Unrolled skin and tacked back down   (including critical care time)  Labs Reviewed - No data to display Ct Head Wo Contrast  09/04/2012  *RADIOLOGY REPORT*  Clinical Data:  fall. BRUISING, ALTERED MENTAL STATUS. HYPERTENSION.  CT HEAD WITHOUT CONTRAST CT CERVICAL SPINE WITHOUT CONTRAST  Technique:  Multidetector CT imaging of the head and cervical spine was performed following the standard protocol without IV contrast. Multiplanar CT image reconstructions of the cervical spine were also generated.  Comparison: 04/07/2011  CT HEAD  Findings: Atherosclerotic and physiologic intracranial calcifications.  Moderate diffuse parenchymal atrophy with resultant prominence of extra-axial CSF spaces. There is no evidence of acute intracranial hemorrhage, brain edema, mass lesion, acute infarction,   mass effect, or midline shift. Acute infarct may be inapparent on noncontrast CT.  No other intra-axial abnormalities are seen, and the ventricles and sulci are within normal limits in size and symmetry.   No abnormal extra-axial fluid collections or masses are identified.  No significant calvarial abnormality.  IMPRESSION: 1. Negative for bleed or other acute intracranial process.  CT CERVICAL SPINE  Findings: Normal alignment.  Narrowing of the C5-6 and C6-7 interspaces.  Circumferential disc bulges at both levels with posterior endplate spurs.  Uncovertebral hypertrophy results in foraminal encroachment, right greater than left.  There are small anterior endplate spurs about the C4-5 interspace.  No prevertebral soft tissue swelling.   Negative for fracture.  Asymmetric facet degenerative hypertrophy C2-3 and C3-4, right greater than left. Bilateral partially calcified carotid bifurcation plaque.  IMPRESSION: 1.  Negative for fracture or other acute bony abnormality. 2.  Multilevel degenerative changes as above. 3.  Bilateral carotid bifurcation plaque.   Original Report Authenticated By: D. Andria Rhein, MD    Ct Cervical Spine Wo Contrast  09/04/2012  *RADIOLOGY REPORT*  Clinical Data:  fall. BRUISING, ALTERED MENTAL STATUS. HYPERTENSION.  CT HEAD WITHOUT CONTRAST CT CERVICAL SPINE WITHOUT CONTRAST  Technique:  Multidetector CT imaging of the head  and cervical spine was performed following the standard protocol without IV contrast. Multiplanar CT image reconstructions of the cervical spine were also generated.  Comparison: 04/07/2011  CT HEAD  Findings: Atherosclerotic and physiologic intracranial calcifications.  Moderate diffuse parenchymal atrophy with resultant prominence of extra-axial CSF spaces. There is no evidence of acute intracranial hemorrhage, brain edema, mass lesion, acute infarction,   mass effect, or midline shift. Acute infarct may be inapparent on noncontrast CT.  No other intra-axial abnormalities are seen, and the ventricles and sulci are within normal limits in size and symmetry.   No abnormal extra-axial fluid collections or masses are identified.  No significant calvarial abnormality.  IMPRESSION: 1. Negative for bleed or other acute intracranial process.  CT CERVICAL SPINE  Findings: Normal alignment.  Narrowing of the C5-6 and C6-7 interspaces.  Circumferential disc bulges at both levels with posterior endplate spurs.  Uncovertebral hypertrophy results in foraminal encroachment, right greater than left.  There are small anterior endplate spurs about the C4-5 interspace.  No prevertebral soft tissue swelling.  Negative for fracture.  Asymmetric facet degenerative hypertrophy C2-3 and C3-4, right greater than left.  Bilateral partially calcified carotid bifurcation plaque.  IMPRESSION: 1.  Negative for fracture or other acute bony abnormality. 2.  Multilevel degenerative changes as above. 3.  Bilateral carotid bifurcation plaque.   Original Report Authenticated By: D. Andria Rhein, MD      1. Head injury   2. Contusion   3. Noninfected skin tear of leg       MDM  No acute brain or neck injury noted:pt instructed to not continue to take the ambien:pt ambulating at baseline        Teressa Lower, NP 09/04/12 1915

## 2012-09-04 NOTE — ED Notes (Addendum)
Pt sustained a fall Monday at 0330 striking head on a hardwood floor.  Unknown LOC per family.  Bruising noted to left side of forehead. Family reports pt has been sleepy today.

## 2012-09-05 NOTE — ED Provider Notes (Signed)
Medical screening examination/treatment/procedure(s) were performed by non-physician practitioner and as supervising physician I was immediately available for consultation/collaboration.   Carleene Cooper III, MD 09/05/12 [phone]

## 2012-09-07 ENCOUNTER — Ambulatory Visit (INDEPENDENT_AMBULATORY_CARE_PROVIDER_SITE_OTHER): Payer: Medicare Other | Admitting: Internal Medicine

## 2012-09-07 ENCOUNTER — Encounter: Payer: Self-pay | Admitting: Internal Medicine

## 2012-09-07 VITALS — BP 102/50 | HR 88 | Temp 98.0°F | Resp 18 | Wt 132.0 lb

## 2012-09-07 DIAGNOSIS — I10 Essential (primary) hypertension: Secondary | ICD-10-CM

## 2012-09-07 DIAGNOSIS — N259 Disorder resulting from impaired renal tubular function, unspecified: Secondary | ICD-10-CM

## 2012-09-07 DIAGNOSIS — G4700 Insomnia, unspecified: Secondary | ICD-10-CM

## 2012-09-07 MED ORDER — ALLOPURINOL 100 MG PO TABS: 100.0000 mg | ORAL_TABLET | Freq: Every day | ORAL | Status: DC

## 2012-09-07 MED ORDER — COLCHICINE 0.6 MG PO TABS: 0.6000 mg | ORAL_TABLET | Freq: Three times a day (TID) | ORAL | Status: DC | PRN

## 2012-09-07 MED ORDER — TAMSULOSIN HCL 0.4 MG PO CAPS: 0.4000 mg | ORAL_CAPSULE | Freq: Every day | ORAL | Status: DC

## 2012-09-07 MED ORDER — TRAMADOL HCL 50 MG PO TABS: 50.0000 mg | ORAL_TABLET | Freq: Four times a day (QID) | ORAL | Status: DC | PRN

## 2012-09-07 NOTE — Progress Notes (Signed)
Subjective:    [name], male    DOB: April 04, 1925, 79 y.o.   MRN: [id number]  HPI  79 year old patient who is seen today in followup. The patient fell recently and was evaluated at the urgent care that included a head CT scan. He sustained trauma and ecchymoses to the left frontal scalp region. Head CT was normal at the present time he is doing much better does have some mild discomfort involving the left knee area. He sustained a superficial laceration to this area as well. He has a history of renal insufficiency and mild hypertension. He does have a history of gout  Past Medical History  Diagnosis Date  . Gout   . Insomnia   . Hypertension   . BPH (benign prostatic hyperplasia)   . Kidney disorder     History   Social History  . Marital Status: Married    Spouse Name: N/A    Number of Children: N/A  . Years of Education: N/A   Occupational History  . Not on file.   Social History Main Topics  . Smoking status: Never Smoker   . Smokeless tobacco: Never Used  . Alcohol Use: No  . Drug Use: No  . Sexually Active: Not on file   Other Topics Concern  . Not on file   Social History Narrative  . No narrative on file    Past Surgical History  Procedure Date  . Cataract extraction   . Cholecystectomy   . Hernia repair 02/22/11    right  . Total hip arthroplasty   . Joint replacement     Family History  Problem Relation Age of Onset  . Heart failure Mother   . Heart failure Father   . Diabetes Neg Hx     Allergies  Allergen Reactions  . Nsaids     REACTION: hx of renal failure    Current Outpatient Prescriptions on File Prior to Visit  Medication Sig Dispense Refill  . acetaminophen (TYLENOL) 325 MG tablet Take 650 mg by mouth every 6 (six) hours as needed.      Marland Kitchen allopurinol (ZYLOPRIM) 100 MG tablet Take 1 tablet (100 mg total) by mouth daily.  90 tablet  3  . colchicine 0.6 MG tablet Take 0.6 mg by mouth 3 (three) times daily as needed.       . terazosin (HYTRIN) 5 MG capsule Take 5 mg by mouth at bedtime.        . traMADol (ULTRAM) 50 MG tablet Take 50 mg by mouth every 6 (six) hours as needed.      . Tamsulosin HCl (FLOMAX) 0.4 MG CAPS Take 0.4 mg by mouth daily after supper.        BP 102/50  Pulse 88  Temp 98 F (36.7 C) (Oral)  Resp 18  Wt 132 lb (59.875 kg)  SpO2 99%      Review of Systems  Constitutional: Negative for fever, chills, appetite change and fatigue.  HENT: Negative for hearing loss, ear pain, congestion, sore throat, trouble swallowing, neck stiffness, dental problem, voice change and tinnitus.   Eyes: Negative for pain, discharge and visual disturbance.  Respiratory: Negative for cough, chest tightness, wheezing and stridor.   Cardiovascular: Negative for chest pain, palpitations and leg swelling.  Gastrointestinal: Negative for nausea, vomiting, abdominal pain, diarrhea, constipation, blood in stool and abdominal distention.  Genitourinary: Negative for urgency, hematuria, flank pain, discharge, difficulty urinating and genital sores.  Musculoskeletal: Positive for arthralgias  and gait problem. Negative for myalgias, back pain and joint swelling.  Skin: Negative for rash.  Neurological: Negative for dizziness, syncope, speech difficulty, weakness, numbness and headaches.  Hematological: Negative for adenopathy. Does not bruise/bleed easily.  Psychiatric/Behavioral: Negative for behavioral problems and dysphoric mood. The patient is not nervous/anxious.        Objective:   Physical Exam  Constitutional: He is oriented to person, place, and time. He appears well-developed.  HENT:  Head: Normocephalic.  Right Ear: External ear normal.  Left Ear: External ear normal.  Eyes: Conjunctivae normal and EOM are normal.  Neck: Normal range of motion.  Cardiovascular: Normal rate and normal heart sounds.   Pulmonary/Chest: Breath sounds normal.  Abdominal: Bowel sounds are normal.  Musculoskeletal:  Normal range of motion. He exhibits no edema and no tenderness.       Left knee is slightly warm to touch;  a superficial skin tear noted just inferior and lateral to the patella. Steri-Strips in place  Neurological: He is alert and oriented to person, place, and time.  Skin:       Resolving ecchymosis involving the left frontal scalp area  Psychiatric: He has a normal mood and affect. His behavior is normal.          Assessment & Plan:   Insomnia. Ambien may have been a factor in the recent fall. This medication has been discontinued Contusion left knee. Patient is ambulatory and it seems to be improving well continue tramadol when necessary Tylenol when necessary

## 2012-09-07 NOTE — Patient Instructions (Signed)
Discontinue Hytrin (terazosin)  Limit your sodium (Salt) intake  Return in 6 months for follow-up

## 2012-09-11 ENCOUNTER — Encounter: Payer: Self-pay | Admitting: Internal Medicine

## 2012-09-11 ENCOUNTER — Ambulatory Visit (INDEPENDENT_AMBULATORY_CARE_PROVIDER_SITE_OTHER): Payer: Medicare Other | Admitting: Internal Medicine

## 2012-09-11 VITALS — BP 110/64 | HR 102 | Temp 98.4°F | Resp 20 | Wt 135.0 lb

## 2012-09-11 DIAGNOSIS — I10 Essential (primary) hypertension: Secondary | ICD-10-CM

## 2012-09-11 DIAGNOSIS — L02419 Cutaneous abscess of limb, unspecified: Secondary | ICD-10-CM

## 2012-09-11 DIAGNOSIS — L03116 Cellulitis of left lower limb: Secondary | ICD-10-CM

## 2012-09-11 MED ORDER — CEPHALEXIN 500 MG PO CAPS: 500.0000 mg | ORAL_CAPSULE | Freq: Three times a day (TID) | ORAL | Status: DC

## 2012-09-11 NOTE — Progress Notes (Signed)
Subjective:    [name], male    DOB: 04/25/1925, 79 y.o.   MRN: [id number]  HPI  79 year old patient who sustained atraumatic abrasion to the left lateral lower leg just distal to the knee. More recently has developed some increasing erythema about the healing abrasion. Denies any fever or worsening pain  Past Medical History  Diagnosis Date  . Gout   . Insomnia   . Hypertension   . BPH (benign prostatic hyperplasia)   . Kidney disorder     History   Social History  . Marital Status: Married    Spouse Name: N/A    Number of Children: N/A  . Years of Education: N/A   Occupational History  . Not on file.   Social History Main Topics  . Smoking status: Never Smoker   . Smokeless tobacco: Never Used  . Alcohol Use: No  . Drug Use: No  . Sexually Active: Not on file   Other Topics Concern  . Not on file   Social History Narrative  . No narrative on file    Past Surgical History  Procedure Date  . Cataract extraction   . Cholecystectomy   . Hernia repair 02/22/11    right  . Total hip arthroplasty   . Joint replacement     Family History  Problem Relation Age of Onset  . Heart failure Mother   . Heart failure Father   . Diabetes Neg Hx     Allergies  Allergen Reactions  . Nsaids     REACTION: hx of renal failure    Current Outpatient Prescriptions on File Prior to Visit  Medication Sig Dispense Refill  . acetaminophen (TYLENOL) 325 MG tablet Take 650 mg by mouth every 6 (six) hours as needed.      Marland Kitchen allopurinol (ZYLOPRIM) 100 MG tablet Take 1 tablet (100 mg total) by mouth daily.  90 tablet  3  . colchicine 0.6 MG tablet Take 1 tablet (0.6 mg total) by mouth 3 (three) times daily as needed.  90 tablet  6  . Tamsulosin HCl (FLOMAX) 0.4 MG CAPS Take 1 capsule (0.4 mg total) by mouth daily after supper.  90 capsule  6  . traMADol (ULTRAM) 50 MG tablet Take 1 tablet (50 mg total) by mouth every 6 (six) hours as needed.  60 tablet  4     BP 110/64  Pulse 102  Temp 98.4 F (36.9 C) (Oral)  Resp 20  Wt 135 lb (61.236 kg)  SpO2 96%       Review of Systems  Constitutional: Negative for fever, chills, appetite change and fatigue.  HENT: Negative for hearing loss, ear pain, congestion, sore throat, trouble swallowing, neck stiffness, dental problem, voice change and tinnitus.   Eyes: Negative for pain, discharge and visual disturbance.  Respiratory: Negative for cough, chest tightness, wheezing and stridor.   Cardiovascular: Negative for chest pain, palpitations and leg swelling.  Gastrointestinal: Negative for nausea, vomiting, abdominal pain, diarrhea, constipation, blood in stool and abdominal distention.  Genitourinary: Negative for urgency, hematuria, flank pain, discharge, difficulty urinating and genital sores.  Musculoskeletal: Negative for myalgias, back pain, joint swelling, arthralgias and gait problem.  Skin: Positive for rash.  Neurological: Negative for dizziness, syncope, speech difficulty, weakness, numbness and headaches.  Hematological: Negative for adenopathy. Does not bruise/bleed easily.  Psychiatric/Behavioral: Negative for behavioral problems and dysphoric mood. The patient is not nervous/anxious.        Objective:   Physical  Exam  Constitutional: He appears well-developed and well-nourished. No distress.  Skin:       Left knee was slightly warm to touch compared to the right A superficial 2-2.5 cm abrasion that appeared to be clean and healing noted involving the left lateral leg just distal to the knee. There was some more distal erythema and slight excess of warmth to touch          Assessment & Plan:    abrasion left lower leg. Possible early cellulitis. Will treat with cephalexin. Continue local wound care. Hypertension stable

## 2012-09-11 NOTE — Patient Instructions (Signed)
Take your antibiotic as prescribed until ALL of it is gone, but stop if you develop a rash, swelling, or any side effects of the medication.  Contact our office as soon as possible if  there are side effects of the medication.Cellulitis Cellulitis is an infection of the skin and the tissue beneath it. The infected area is usually red and tender. Cellulitis occurs most often in the arms and lower legs.   CAUSES   Cellulitis is caused by bacteria that enter the skin through cracks or cuts in the skin. The most common types of bacteria that cause cellulitis are Staphylococcus and Streptococcus. SYMPTOMS    Redness and warmth.   Swelling.   Tenderness or pain.   Fever.  DIAGNOSIS  Your caregiver can usually determine what is wrong based on a physical exam. Blood tests may also be done. TREATMENT   Treatment usually involves taking an antibiotic medicine. HOME CARE INSTRUCTIONS    Take your antibiotics as directed. Finish them even if you start to feel better.   Keep the infected arm or leg elevated to reduce swelling.   Apply a warm cloth to the affected area up to 4 times per day to relieve pain.   Only take over-the-counter or prescription medicines for pain, discomfort, or fever as directed by your caregiver.   Keep all follow-up appointments as directed by your caregiver.  SEEK MEDICAL CARE IF:    You notice red streaks coming from the infected area.   Your red area gets larger or turns dark in color.   Your bone or joint underneath the infected area becomes painful after the skin has healed.   Your infection returns in the same area or another area.   You notice a swollen bump in the infected area.   You develop new symptoms.  SEEK IMMEDIATE MEDICAL CARE IF:    You have a fever.   You feel very sleepy.   You develop vomiting or diarrhea.   You have a general ill feeling (malaise) with muscle aches and pains.  MAKE SURE YOU:    Understand these instructions.    Will watch your condition.   Will get help right away if you are not doing well or get worse.  Document Released: 05/18/2005 Document Revised: 02/07/2012 Document Reviewed: 10/24/2011 [facility] Patient Information 2013 [place], [place].

## 2012-09-12 ENCOUNTER — Telehealth: Payer: Self-pay | Admitting: Internal Medicine

## 2012-09-12 NOTE — Telephone Encounter (Signed)
[facility]ard [facility] for pick up

## 2012-09-12 NOTE — Telephone Encounter (Signed)
Pt's daughter states she went to pharmacy to pick up keflex and pharmacy states they never received rx request yesterday.  Please call in rx so pt can pick up and begin taking today.  [facility].

## 2012-10-11 ENCOUNTER — Encounter: Payer: Self-pay | Admitting: Internal Medicine

## 2012-10-11 ENCOUNTER — Ambulatory Visit (INDEPENDENT_AMBULATORY_CARE_PROVIDER_SITE_OTHER): Payer: Medicare Other | Admitting: Internal Medicine

## 2012-10-11 VITALS — BP 142/80 | HR 98 | Temp 98.5°F | Resp 20 | Wt 132.0 lb

## 2012-10-11 DIAGNOSIS — M25551 Pain in right hip: Secondary | ICD-10-CM

## 2012-10-11 DIAGNOSIS — I10 Essential (primary) hypertension: Secondary | ICD-10-CM

## 2012-10-11 MED ORDER — TRAMADOL HCL 50 MG PO TABS: 50.0000 mg | ORAL_TABLET | Freq: Four times a day (QID) | ORAL | Status: DC | PRN

## 2012-10-11 NOTE — Patient Instructions (Signed)
Orthopedic followup as discussed  Limit your sodium (Salt) intake  Return in 6 months for follow-up

## 2012-10-11 NOTE — Progress Notes (Signed)
Subjective:    [name], male    DOB: [birth year], 79 y.o.   MRN: [id number]  HPI 79 year old patient who has treated hypertension. He presents today with the orthopedic concerns. He complains of some painless popping involving the left knee with walking. His chief complaint is pain high in the right groin area. Is approximately 2 years out from right total hip replacement surgery. He normally walks with a cane but more recently has needed a walker to the discomfort and sense of unsteadiness.  Past Medical History  Diagnosis Date  . Gout   . Insomnia   . Hypertension   . BPH (benign prostatic hyperplasia)   . Kidney disorder     History   Social History  . Marital Status: Married    Spouse Name: N/A    Number of Children: N/A  . Years of Education: N/A   Occupational History  . Not on file.   Social History Main Topics  . Smoking status: Never Smoker   . Smokeless tobacco: Never Used  . Alcohol Use: No  . Drug Use: No  . Sexually Active: Not on file   Other Topics Concern  . Not on file   Social History Narrative  . No narrative on file    Past Surgical History  Procedure Laterality Date  . Cataract extraction    . Cholecystectomy    . Hernia repair  02/22/11    right  . Total hip arthroplasty    . Joint replacement      Family History  Problem Relation Age of Onset  . Heart failure Mother   . Heart failure Father   . Diabetes Neg Hx     Allergies  Allergen Reactions  . Nsaids     REACTION: hx of renal failure    Current Outpatient Prescriptions on File Prior to Visit  Medication Sig Dispense Refill  . acetaminophen (TYLENOL) 325 MG tablet Take 650 mg by mouth every 6 (six) hours as needed.      Marland Kitchen allopurinol (ZYLOPRIM) 100 MG tablet Take 1 tablet (100 mg total) by mouth daily.  90 tablet  3  . colchicine 0.6 MG tablet Take 1 tablet (0.6 mg total) by mouth 3 (three) times daily as needed.  90 tablet  6  . Tamsulosin HCl (FLOMAX) 0.4  MG CAPS Take 1 capsule (0.4 mg total) by mouth daily after supper.  90 capsule  6  . traMADol (ULTRAM) 50 MG tablet Take 1 tablet (50 mg total) by mouth every 6 (six) hours as needed.  60 tablet  4   No current facility-administered medications on file prior to visit.    BP 142/80  Pulse 98  Temp(Src) 98.5 F (36.9 C) (Oral)  Resp 20  Wt 132 lb (59.875 kg)  BMI 21.32 kg/m2  SpO2 98%        Review of Systems  Constitutional: Negative for fever, chills, appetite change and fatigue.  HENT: Negative for hearing loss, ear pain, congestion, sore throat, trouble swallowing, neck stiffness, dental problem, voice change and tinnitus.   Eyes: Negative for pain, discharge and visual disturbance.  Respiratory: Negative for cough, chest tightness, wheezing and stridor.   Cardiovascular: Negative for chest pain, palpitations and leg swelling.  Gastrointestinal: Negative for nausea, vomiting, abdominal pain, diarrhea, constipation, blood in stool and abdominal distention.  Genitourinary: Negative for urgency, hematuria, flank pain, discharge, difficulty urinating and genital sores.  Musculoskeletal: Positive for arthralgias and gait problem. Negative for  myalgias, back pain and joint swelling.  Skin: Negative for rash.  Neurological: Negative for dizziness, syncope, speech difficulty, weakness, numbness and headaches.  Hematological: Negative for adenopathy. Does not bruise/bleed easily.  Psychiatric/Behavioral: Negative for behavioral problems and dysphoric mood. The patient is not nervous/anxious.        Objective:   Physical Exam  Constitutional: He appears well-developed and well-nourished. No distress.  Repeat blood pressure 130/70  Musculoskeletal:  Flexion right hip aggravates the pain  No acute inflammatory changes of left knee          Assessment & Plan:

## 2012-10-25 ENCOUNTER — Encounter (HOSPITAL_COMMUNITY): Payer: Self-pay | Admitting: *Deleted

## 2012-10-25 NOTE — Progress Notes (Signed)
Patient is scheduled for surgery on 10/29/12.  Need orders in EPIC.  Thanks.

## 2012-10-26 ENCOUNTER — Encounter (HOSPITAL_COMMUNITY): Payer: Self-pay | Admitting: *Deleted

## 2012-10-28 NOTE — H&P (Signed)
TOTAL HIP REVISION ADMISSION H&P  Patient is admitted for right revision total hip arthroplasty.  Subjective:  Chief Complaint: Right hip pain s/p IM nail   HPI: [name], 79 y.o. male, has a history of pain and functional disability in the right hip which has increased in the right hip and groin pain, denies any new injury. S/P right hip IM nail 04/09/2011.  Complaining of a fall several weeks ago but only noticed worsening pain over the past 2 weeks. States he was doing well until then.  He states therapy had been going well until 2 weeks ago.  Pain at night, pain with weightbearing, difficulty ambulating and difficulty arising from chair. The patient feels that they are doing poorly and report their pain level to be moderate to severe. Current treatment includes: modified weightbearing (walker), relative rest and pain medications. The following medication has been used for pain control: tramadol.  Patient has evidence of protruding compression screw from IM nail into acetabulum by imaging studies.  This condition presents safety issues increasing the risk of falls. There is no current active infection.  Risks, benefits and expectations were discussed with the patient. Patient understand the risks, benefits and expectations and wishes to proceed with surgery.   D/C Plans:   Home with HHPT/SNF  ?  Post-op Meds:    No Rx given  Tranexamic Acid:   To be given  Decadron:    Not to be given  FYI:   Nothing to note   Patient Active Problem List   Diagnosis Date Noted  . Inguinal hernia 03/21/2011  . THROMBOCYTOPENIA 11/17/2009  . OTHER GENERAL SYMPTOMS 05/28/2008  . RENAL INSUFFICIENCY 11/26/2007  . HYPERGLYCEMIA 11/26/2007  . BENIGN PROSTATIC HYPERTROPHY 03/12/2007  . GOUT 03/02/2007  . INSOMNIA, PERSISTENT 03/02/2007  . HYPERTENSION 03/02/2007   Past Medical History  Diagnosis Date  . Gout   . Insomnia   . BPH (benign prostatic hyperplasia)   . Kidney disorder   .  Complication of anesthesia     hard to wake up -2011 after hernia surgery   . Arthritis   . Hypertension     patient denies on 10/25/12 on no meds     Past Surgical History  Procedure Laterality Date  . Cataract extraction    . Cholecystectomy    . Hernia repair  02/22/11    right  . Total hip arthroplasty    . Joint replacement      No prescriptions prior to admission   Allergies  Allergen Reactions  . Nsaids     REACTION: hx of renal failure    History  Substance Use Topics  . Smoking status: Never Smoker   . Smokeless tobacco: Never Used  . Alcohol Use: No    Family History  Problem Relation Age of Onset  . Heart failure Mother   . Heart failure Father   . Diabetes Neg Hx     Review of Systems  Constitutional: Negative.   HENT: Negative.   Eyes: Negative.   Respiratory: Negative.   Cardiovascular: Negative.   Gastrointestinal: Negative.   Genitourinary: Negative.   Musculoskeletal: Positive for joint pain.  Skin: Negative.   Neurological: Negative.   Endo/Heme/Allergies: Negative.   Psychiatric/Behavioral: Negative.     Objective:  Physical Exam  Constitutional: He is oriented to person, place, and time. He appears well-developed and well-nourished.  HENT:  Head: Normocephalic and atraumatic.  Mouth/Throat: Oropharynx is clear and moist.  Eyes: Pupils are equal, round,  and reactive to light.  Neck: Neck supple. No JVD present. No tracheal deviation present. No thyromegaly present.  Cardiovascular: Normal rate, regular rhythm and intact distal pulses.   Respiratory: Effort normal and breath sounds normal. No respiratory distress. He has no wheezes.  GI: Soft. There is no tenderness. There is no guarding.  Musculoskeletal:       Right hip: He exhibits decreased range of motion, decreased strength, tenderness, bony tenderness, deformity and laceration (previous hip surgery). He exhibits no swelling and no crepitus.  Lymphadenopathy:    He has no cervical  adenopathy.  Neurological: He is alert and oriented to person, place, and time.  Skin: Skin is warm and dry.  Psychiatric: He has a normal mood and affect.    Labs:  Estimated body mass index is 21.32 kg/(m^2) as calculated from the following:   Height as of 09/04/12: 5\' 6"  (1.676 m).   Weight as of 10/11/12: 59.875 kg (132 lb).  Imaging Review:  Plain radiographs demonstrate protruding compression screw from IM nail into acetabulum  The bone quality appears to be good for age and reported activity level.  Assessment/Plan:  Failed previous right hip surgery, IM nail.  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  Revision total hip arthroplasty is deemed medically necessary. The treatment options including medical management, injection therapy, arthroscopy and arthroplasty were discussed at length. The risks and benefits of total hip arthroplasty were presented and reviewed. The risks due to aseptic loosening, infection, stiffness, dislocation/subluxation,  thromboembolic complications and other imponderables were discussed.  The patient acknowledged the explanation, agreed to proceed with the plan and consent was signed. Patient is being admitted for inpatient treatment for surgery, pain control, PT, OT, prophylactic antibiotics, VTE prophylaxis, progressive ambulation and ADL's and discharge planning. The patient is planning to be discharged home with home health services vs SNF.     Anastasio Auerbach Babish   PAC  10/28/2012, 4:36 PM

## 2012-10-29 ENCOUNTER — Encounter (HOSPITAL_COMMUNITY): Payer: Self-pay | Admitting: *Deleted

## 2012-10-29 ENCOUNTER — Encounter (HOSPITAL_COMMUNITY)
Admission: RE | Disposition: A | Discharge status: Skilled Nursing Facility | Payer: Self-pay | Source: Ambulatory Visit | Attending: Orthopedic Surgery

## 2012-10-29 ENCOUNTER — Inpatient Hospital Stay (HOSPITAL_COMMUNITY): Payer: Medicare Other

## 2012-10-29 ENCOUNTER — Inpatient Hospital Stay (HOSPITAL_COMMUNITY): Payer: Medicare Other | Admitting: Anesthesiology

## 2012-10-29 ENCOUNTER — Encounter (HOSPITAL_COMMUNITY): Payer: Self-pay | Admitting: Anesthesiology

## 2012-10-29 ENCOUNTER — Inpatient Hospital Stay (HOSPITAL_COMMUNITY)
Admission: RE | Admit: 2012-10-29 | Discharge: 2012-11-01 | DRG: 470 | Disposition: A | Discharge status: Skilled Nursing Facility | Payer: Medicare Other | Source: Ambulatory Visit | Attending: Orthopedic Surgery | Admitting: Orthopedic Surgery

## 2012-10-29 DIAGNOSIS — Z96649 Presence of unspecified artificial hip joint: Secondary | ICD-10-CM

## 2012-10-29 DIAGNOSIS — T84498A Other mechanical complication of other internal orthopedic devices, implants and grafts, initial encounter: Principal | ICD-10-CM | POA: Diagnosis present

## 2012-10-29 DIAGNOSIS — G4700 Insomnia, unspecified: Secondary | ICD-10-CM | POA: Diagnosis present

## 2012-10-29 DIAGNOSIS — D62 Acute posthemorrhagic anemia: Secondary | ICD-10-CM | POA: Diagnosis not present

## 2012-10-29 DIAGNOSIS — I10 Essential (primary) hypertension: Secondary | ICD-10-CM | POA: Diagnosis present

## 2012-10-29 DIAGNOSIS — Y831 Surgical operation with implant of artificial internal device as the cause of abnormal reaction of the patient, or of later complication, without mention of misadventure at the time of the procedure: Secondary | ICD-10-CM | POA: Diagnosis present

## 2012-10-29 DIAGNOSIS — N400 Enlarged prostate without lower urinary tract symptoms: Secondary | ICD-10-CM | POA: Diagnosis present

## 2012-10-29 DIAGNOSIS — M109 Gout, unspecified: Secondary | ICD-10-CM | POA: Diagnosis present

## 2012-10-29 DIAGNOSIS — IMO0002 Reserved for concepts with insufficient information to code with codable children: Secondary | ICD-10-CM | POA: Diagnosis present

## 2012-10-29 DIAGNOSIS — Z79899 Other long term (current) drug therapy: Secondary | ICD-10-CM

## 2012-10-29 HISTORY — DX: Other complications of anesthesia, initial encounter: T88.59XA

## 2012-10-29 HISTORY — DX: Adverse effect of unspecified anesthetic, initial encounter: T41.45XA

## 2012-10-29 HISTORY — PX: TOTAL HIP ARTHROPLASTY: SHX124

## 2012-10-29 HISTORY — DX: Unspecified osteoarthritis, unspecified site: M19.90

## 2012-10-29 LAB — TYPE AND SCREEN
ABO/RH(D): A NEG
Antibody Screen: NEGATIVE

## 2012-10-29 LAB — BASIC METABOLIC PANEL
CO2: 26 mEq/L (ref 19–32)
Calcium: 9 mg/dL (ref 8.4–10.5)
Sodium: 141 mEq/L (ref 135–145)

## 2012-10-29 LAB — URINALYSIS, ROUTINE W REFLEX MICROSCOPIC
Bilirubin Urine: NEGATIVE
Hgb urine dipstick: NEGATIVE
Ketones, ur: NEGATIVE mg/dL
Protein, ur: NEGATIVE mg/dL
Urobilinogen, UA: 1 mg/dL (ref 0.0–1.0)

## 2012-10-29 LAB — CBC
HCT: 35.8 % — ABNORMAL LOW (ref 39.0–52.0)
Hemoglobin: 11.8 g/dL — ABNORMAL LOW (ref 13.0–17.0)
MCHC: 33 g/dL (ref 30.0–36.0)
MCV: 89.3 fL (ref 78.0–100.0)
Platelets: 181 10*3/uL (ref 150–400)
RDW: 13.3 % (ref 11.5–15.5)

## 2012-10-29 LAB — URINE MICROSCOPIC-ADD ON

## 2012-10-29 LAB — PROTIME-INR
INR: 1.18 (ref 0.00–1.49)
Prothrombin Time: 14.8 seconds (ref 11.6–15.2)

## 2012-10-29 LAB — SURGICAL PCR SCREEN: Staphylococcus aureus: POSITIVE — AB

## 2012-10-29 SURGERY — ARTHROPLASTY, HIP, TOTAL,POSTERIOR APPROACH
Anesthesia: General | Site: Hip | Laterality: Right | Wound class: Clean

## 2012-10-29 MED ORDER — METHOCARBAMOL 500 MG PO TABS: 500.0000 mg | ORAL_TABLET | Freq: Four times a day (QID) | ORAL | Status: DC | PRN

## 2012-10-29 MED ORDER — POLYVINYL ALCOHOL 1.4 % OP SOLN
1.0000 [drp] | Freq: Three times a day (TID) | OPHTHALMIC | Status: DC | PRN
  Filled 2012-10-29: qty 15

## 2012-10-29 MED ORDER — ALUM & MAG HYDROXIDE-SIMETH 200-200-20 MG/5ML PO SUSP: 30.0000 mL | ORAL | Status: DC | PRN

## 2012-10-29 MED ORDER — LACTATED RINGERS IV SOLN: INTRAVENOUS | Status: DC

## 2012-10-29 MED ORDER — HYDROMORPHONE HCL PF 1 MG/ML IJ SOLN: 0.5000 mg | INTRAMUSCULAR | Status: DC | PRN

## 2012-10-29 MED ORDER — TAMSULOSIN HCL 0.4 MG PO CAPS
0.4000 mg | ORAL_CAPSULE | Freq: Every day | ORAL | Status: DC
  Administered 2012-10-29 – 2012-10-31 (×3): 0.4 mg via ORAL
  Filled 2012-10-29 (×4): qty 1

## 2012-10-29 MED ORDER — METOCLOPRAMIDE HCL 5 MG/ML IJ SOLN: 5.0000 mg | Freq: Three times a day (TID) | INTRAMUSCULAR | Status: DC | PRN

## 2012-10-29 MED ORDER — CEFAZOLIN SODIUM-DEXTROSE 2-3 GM-% IV SOLR
2.0000 g | Freq: Four times a day (QID) | INTRAVENOUS | Status: AC
  Administered 2012-10-29 (×2): 2 g via INTRAVENOUS
  Filled 2012-10-29 (×2): qty 50

## 2012-10-29 MED ORDER — FENTANYL CITRATE 0.05 MG/ML IJ SOLN: 50.0000 ug | INTRAMUSCULAR | Status: DC | PRN

## 2012-10-29 MED ORDER — BISACODYL 10 MG RE SUPP: 10.0000 mg | Freq: Every day | RECTAL | Status: DC | PRN

## 2012-10-29 MED ORDER — CHLORHEXIDINE GLUCONATE 4 % EX LIQD
60.0000 mL | Freq: Once | CUTANEOUS | Status: DC
  Filled 2012-10-29: qty 60

## 2012-10-29 MED ORDER — PHENOL 1.4 % MT LIQD: 1.0000 | OROMUCOSAL | Status: DC | PRN

## 2012-10-29 MED ORDER — DIAZEPAM 5 MG/ML IJ SOLN
2.0000 mg | Freq: Once | INTRAMUSCULAR | Status: AC
  Administered 2012-10-29: 2 mg via INTRAVENOUS

## 2012-10-29 MED ORDER — ZOLPIDEM TARTRATE 5 MG PO TABS: 5.0000 mg | ORAL_TABLET | Freq: Every evening | ORAL | Status: DC | PRN

## 2012-10-29 MED ORDER — ONDANSETRON HCL 4 MG/2ML IJ SOLN
INTRAMUSCULAR | Status: DC | PRN
  Administered 2012-10-29: 4 mg via INTRAVENOUS

## 2012-10-29 MED ORDER — CISATRACURIUM BESYLATE (PF) 10 MG/5ML IV SOLN
INTRAVENOUS | Status: DC | PRN
  Administered 2012-10-29: 7 mg via INTRAVENOUS
  Administered 2012-10-29: 2 mg via INTRAVENOUS

## 2012-10-29 MED ORDER — CARBOXYMETHYLCELLULOSE SODIUM 0.5 % OP SOLN: 1.0000 [drp] | Freq: Three times a day (TID) | OPHTHALMIC | Status: DC | PRN

## 2012-10-29 MED ORDER — ONDANSETRON HCL 4 MG PO TABS: 4.0000 mg | ORAL_TABLET | Freq: Four times a day (QID) | ORAL | Status: DC | PRN

## 2012-10-29 MED ORDER — FLEET ENEMA 7-19 GM/118ML RE ENEM: 1.0000 | ENEMA | Freq: Once | RECTAL | Status: AC | PRN

## 2012-10-29 MED ORDER — TRANEXAMIC ACID 100 MG/ML IV SOLN
1000.0000 mg | Freq: Once | INTRAVENOUS | Status: AC
  Administered 2012-10-29: 1000 mg via INTRAVENOUS
  Filled 2012-10-29: qty 10

## 2012-10-29 MED ORDER — PHENYLEPHRINE HCL 10 MG/ML IJ SOLN
INTRAMUSCULAR | Status: DC | PRN
  Administered 2012-10-29: 40 ug via INTRAVENOUS
  Administered 2012-10-29 (×2): 120 ug via INTRAVENOUS
  Administered 2012-10-29: 40 ug via INTRAVENOUS
  Administered 2012-10-29: 160 ug via INTRAVENOUS

## 2012-10-29 MED ORDER — ONDANSETRON HCL 4 MG/2ML IJ SOLN: 4.0000 mg | Freq: Four times a day (QID) | INTRAMUSCULAR | Status: DC | PRN

## 2012-10-29 MED ORDER — DOCUSATE SODIUM 100 MG PO CAPS
100.0000 mg | ORAL_CAPSULE | Freq: Two times a day (BID) | ORAL | Status: DC
  Administered 2012-10-29 – 2012-11-01 (×6): 100 mg via ORAL

## 2012-10-29 MED ORDER — ALLOPURINOL 100 MG PO TABS
100.0000 mg | ORAL_TABLET | Freq: Every day | ORAL | Status: DC
  Administered 2012-10-29 – 2012-11-01 (×4): 100 mg via ORAL
  Filled 2012-10-29 (×4): qty 1

## 2012-10-29 MED ORDER — METOCLOPRAMIDE HCL 5 MG PO TABS
5.0000 mg | ORAL_TABLET | Freq: Three times a day (TID) | ORAL | Status: DC | PRN
  Filled 2012-10-29: qty 2

## 2012-10-29 MED ORDER — METHOCARBAMOL 100 MG/ML IJ SOLN: 500.0000 mg | Freq: Four times a day (QID) | INTRAVENOUS | Status: DC | PRN

## 2012-10-29 MED ORDER — SODIUM CHLORIDE 0.9 % IV SOLN: INTRAVENOUS | Status: DC

## 2012-10-29 MED ORDER — FENTANYL CITRATE 0.05 MG/ML IJ SOLN
50.0000 ug | INTRAMUSCULAR | Status: DC | PRN
  Administered 2012-10-29 (×2): 50 ug via INTRAVENOUS

## 2012-10-29 MED ORDER — FENTANYL CITRATE 0.05 MG/ML IJ SOLN
INTRAMUSCULAR | Status: DC | PRN
  Administered 2012-10-29: 100 ug via INTRAVENOUS
  Administered 2012-10-29: 75 ug via INTRAVENOUS

## 2012-10-29 MED ORDER — CEFAZOLIN SODIUM-DEXTROSE 2-3 GM-% IV SOLR
2.0000 g | INTRAVENOUS | Status: AC
  Administered 2012-10-29: 2 g via INTRAVENOUS

## 2012-10-29 MED ORDER — PROPOFOL 10 MG/ML IV BOLUS
INTRAVENOUS | Status: DC | PRN
  Administered 2012-10-29: 130 mg via INTRAVENOUS

## 2012-10-29 MED ORDER — NEOSTIGMINE METHYLSULFATE 1 MG/ML IJ SOLN
INTRAMUSCULAR | Status: DC | PRN
  Administered 2012-10-29: 4 mg via INTRAVENOUS

## 2012-10-29 MED ORDER — MENTHOL 3 MG MT LOZG: 1.0000 | LOZENGE | OROMUCOSAL | Status: DC | PRN

## 2012-10-29 MED ORDER — LACTATED RINGERS IV SOLN
INTRAVENOUS | Status: DC
Start: 2012-10-29 — End: 2012-10-29
  Administered 2012-10-29: 1000 mL via INTRAVENOUS

## 2012-10-29 MED ORDER — HYDROCODONE-ACETAMINOPHEN 7.5-325 MG PO TABS
1.0000 | ORAL_TABLET | ORAL | Status: DC
  Administered 2012-10-29 – 2012-10-31 (×9): 1 via ORAL
  Filled 2012-10-29 (×4): qty 1
  Filled 2012-10-29: qty 2
  Filled 2012-10-29: qty 1
  Filled 2012-10-29: qty 2
  Filled 2012-10-29 (×2): qty 1

## 2012-10-29 MED ORDER — FERROUS SULFATE 325 (65 FE) MG PO TABS
325.0000 mg | ORAL_TABLET | Freq: Three times a day (TID) | ORAL | Status: DC
  Administered 2012-10-29 – 2012-11-01 (×8): 325 mg via ORAL
  Filled 2012-10-29 (×11): qty 1

## 2012-10-29 MED ORDER — SODIUM CHLORIDE 0.9 % IV SOLN
100.0000 mL/h | INTRAVENOUS | Status: DC
  Administered 2012-10-29 – 2012-10-30 (×2): 100 mL/h via INTRAVENOUS
  Filled 2012-10-29 (×9): qty 1000

## 2012-10-29 MED ORDER — SODIUM CHLORIDE 0.9 % IV SOLN
INTRAVENOUS | Status: DC | PRN
  Administered 2012-10-29 (×3): via INTRAVENOUS

## 2012-10-29 MED ORDER — GLYCOPYRROLATE 0.2 MG/ML IJ SOLN
INTRAMUSCULAR | Status: DC | PRN
  Administered 2012-10-29: 0.6 mg via INTRAVENOUS

## 2012-10-29 MED ORDER — POLYETHYLENE GLYCOL 3350 17 G PO PACK
17.0000 g | PACK | Freq: Two times a day (BID) | ORAL | Status: DC
  Administered 2012-10-29 – 2012-11-01 (×6): 17 g via ORAL

## 2012-10-29 MED ORDER — MUPIROCIN 2 % EX OINT
TOPICAL_OINTMENT | Freq: Two times a day (BID) | CUTANEOUS | Status: DC
  Administered 2012-10-29: 10:00:00 via NASAL
  Filled 2012-10-29: qty 22

## 2012-10-29 MED ORDER — HYDROMORPHONE HCL PF 1 MG/ML IJ SOLN
0.2500 mg | INTRAMUSCULAR | Status: DC | PRN
  Administered 2012-10-29: 0.5 mg via INTRAVENOUS
  Administered 2012-10-29 (×3): 0.25 mg via INTRAVENOUS

## 2012-10-29 MED ORDER — EPHEDRINE SULFATE 50 MG/ML IJ SOLN
INTRAMUSCULAR | Status: DC | PRN
  Administered 2012-10-29 (×2): 7.5 mg via INTRAVENOUS
  Administered 2012-10-29: 10 mg via INTRAVENOUS

## 2012-10-29 MED ORDER — RIVAROXABAN 10 MG PO TABS
10.0000 mg | ORAL_TABLET | ORAL | Status: DC
  Administered 2012-10-30 – 2012-11-01 (×3): 10 mg via ORAL
  Filled 2012-10-29 (×5): qty 1

## 2012-10-29 MED ORDER — 0.9 % SODIUM CHLORIDE (POUR BTL) OPTIME
TOPICAL | Status: DC | PRN
  Administered 2012-10-29: 1000 mL

## 2012-10-29 SURGICAL SUPPLY — 48 items
ADH SKN CLS APL DERMABOND .7 (GAUZE/BANDAGES/DRESSINGS) ×1
BAG SPEC THK2 15X12 ZIP CLS (MISCELLANEOUS) ×1
BAG ZIPLOCK 12X15 (MISCELLANEOUS) ×2 IMPLANT
BLADE SAW SGTL 18X1.27X75 (BLADE) ×2 IMPLANT
CLOTH BEACON ORANGE TIMEOUT ST (SAFETY) ×2 IMPLANT
DERMABOND ADVANCED (GAUZE/BANDAGES/DRESSINGS) ×1
DERMABOND ADVANCED .7 DNX12 (GAUZE/BANDAGES/DRESSINGS) ×1 IMPLANT
DRAPE INCISE IOBAN 85X60 (DRAPES) ×2 IMPLANT
DRAPE ORTHO SPLIT 77X108 STRL (DRAPES) ×4
DRAPE POUCH INSTRU U-SHP 10X18 (DRAPES) ×2 IMPLANT
DRAPE SURG 17X11 SM STRL (DRAPES) ×2 IMPLANT
DRAPE SURG ORHT 6 SPLT 77X108 (DRAPES) ×2 IMPLANT
DRAPE U-SHAPE 47X51 STRL (DRAPES) ×2 IMPLANT
DRSG AQUACEL AG ADV 3.5X10 (GAUZE/BANDAGES/DRESSINGS) ×2 IMPLANT
DRSG TEGADERM 4X4.75 (GAUZE/BANDAGES/DRESSINGS) ×2 IMPLANT
DURAPREP 26ML APPLICATOR (WOUND CARE) ×2 IMPLANT
ELECT BLADE TIP CTD 4 INCH (ELECTRODE) ×2 IMPLANT
ELECT REM PT RETURN 9FT ADLT (ELECTROSURGICAL) ×2
ELECTRODE REM PT RTRN 9FT ADLT (ELECTROSURGICAL) ×1 IMPLANT
EVACUATOR 1/8 PVC DRAIN (DRAIN) ×2 IMPLANT
FACESHIELD LNG OPTICON STERILE (SAFETY) ×8 IMPLANT
GAUZE SPONGE 2X2 8PLY STRL LF (GAUZE/BANDAGES/DRESSINGS) ×1 IMPLANT
GLOVE BIOGEL PI IND STRL 7.5 (GLOVE) ×1 IMPLANT
GLOVE BIOGEL PI IND STRL 8 (GLOVE) ×1 IMPLANT
GLOVE BIOGEL PI INDICATOR 7.5 (GLOVE) ×1
GLOVE BIOGEL PI INDICATOR 8 (GLOVE) ×1
GLOVE ECLIPSE 8.0 STRL XLNG CF (GLOVE) ×2 IMPLANT
GLOVE ORTHO TXT STRL SZ7.5 (GLOVE) ×4 IMPLANT
GOWN BRE IMP PREV XXLGXLNG (GOWN DISPOSABLE) ×4 IMPLANT
GOWN STRL NON-REIN LRG LVL3 (GOWN DISPOSABLE) ×2 IMPLANT
IMMOBILIZER KNEE 20 (SOFTGOODS) ×4
IMMOBILIZER KNEE 20 THIGH 36 (SOFTGOODS) IMPLANT
KIT BASIN OR (CUSTOM PROCEDURE TRAY) ×2 IMPLANT
MANIFOLD NEPTUNE II (INSTRUMENTS) ×2 IMPLANT
NS IRRIG 1000ML POUR BTL (IV SOLUTION) ×2 IMPLANT
PACK TOTAL JOINT (CUSTOM PROCEDURE TRAY) ×2 IMPLANT
POSITIONER SURGICAL ARM (MISCELLANEOUS) ×2 IMPLANT
SPONGE GAUZE 2X2 STER 10/PKG (GAUZE/BANDAGES/DRESSINGS) ×1
STAPLER VISISTAT (STAPLE) ×2 IMPLANT
SUCTION FRAZIER TIP 10 FR DISP (SUCTIONS) ×2 IMPLANT
SUT MNCRL AB 4-0 PS2 18 (SUTURE) ×2 IMPLANT
SUT VIC AB 1 CT1 36 (SUTURE) ×6 IMPLANT
SUT VIC AB 2-0 CT1 27 (SUTURE) ×4
SUT VIC AB 2-0 CT1 TAPERPNT 27 (SUTURE) ×2 IMPLANT
SUT VLOC 180 0 24IN GS25 (SUTURE) ×4 IMPLANT
TOWEL OR 17X26 10 PK STRL BLUE (TOWEL DISPOSABLE) ×4 IMPLANT
TRAY FOLEY CATH 14FRSI W/METER (CATHETERS) ×2 IMPLANT
WATER STERILE IRR 1500ML POUR (IV SOLUTION) ×2 IMPLANT

## 2012-10-29 NOTE — Transfer of Care (Signed)
Immediate Anesthesia Transfer of Care Note  Patient: [name]  Procedure(s) Performed: Procedure(s): CONVERSION OF PREVIOUS HIP SURGERY TO RIGHT TOTAL HIP AND REMOVAL OF IM NAIL  (Right)  Patient Location: PACU  Anesthesia Type:General  Level of Consciousness: awake, sedated and patient cooperative  Airway & Oxygen Therapy: Patient Spontanous Breathing and Patient connected to face mask oxygen  Post-op Assessment: Report given to PACU RN and Post -op Vital signs reviewed and stable  Post vital signs: Reviewed and stable  Complications: No apparent anesthesia complications

## 2012-10-29 NOTE — Interval H&P Note (Signed)
History and Physical Interval Note:  10/29/2012 11:33 AM  [name]  has presented today for surgery, with the diagnosis of FAILED RIGHT HIP SURGERY   The various methods of treatment have been discussed with the patient and family. After consideration of risks, benefits and other options for treatment, the patient has consented to  Procedure(s): CONVERSION OF PREVIOUS HIP SURGERY TO RIGHT TOTAL HIP AND REMOVAL OF IM NAIL  (Right) as a surgical intervention .  The patient's history has been reviewed, patient examined, no change in status, stable for surgery.  I have reviewed the patient's chart and labs.  Questions were answered to the patient's satisfaction.     Shelda Pal

## 2012-10-29 NOTE — Care Management (Signed)
[facility] is following this pt. [name] [phone]

## 2012-10-29 NOTE — Brief Op Note (Signed)
10/29/2012  2:01 PM  PATIENT:  [name]  79 y.o. 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, previous open reduction internal fixation of right intertrochanteric femur fracture  PROCEDURE:  Procedure(s): CONVERSION OF PREVIOUS HIP SURGERY TO RIGHT TOTAL HIP AND REMOVAL OF IM NAIL  (Right)  SURGEON:  Surgeon(s) and Role:    * Shelda Pal, MD - Primary  PHYSICIAN ASSISTANT: Lanney Gins, PA-C  ANESTHESIA:   general  EBL:  Total I/O In: 2500 [I.V.:2500] Out: 600 [Urine:150; Blood:450]  BLOOD ADMINISTERED:none  DRAINS: (1 medium) Hemovact drain(s) in the right hip with  Suction Open   LOCAL MEDICATIONS USED:  NONE  SPECIMEN:  No Specimen  DISPOSITION OF SPECIMEN:  N/A  COUNTS:  YES  TOURNIQUET:  * No tourniquets in log *  DICTATION: .Other Dictation: Dictation Number [id number]  PLAN OF CARE: Admit to inpatient   PATIENT DISPOSITION:  PACU - hemodynamically stable.   Delay start of Pharmacological VTE agent (>24hrs) due to surgical blood loss or risk of bleeding: no

## 2012-10-29 NOTE — Anesthesia Preprocedure Evaluation (Addendum)
Anesthesia Evaluation  Patient identified by MRN, date of birth, ID band Patient awake    Reviewed: Allergy & Precautions, H&P , NPO status , Patient's Chart, lab work & pertinent test results  Airway Mallampati: II TM Distance: >3 FB Neck ROM: full    Dental no notable dental hx. (+) Teeth Intact and Dental Advisory Given   Pulmonary neg pulmonary ROS,  breath sounds clear to auscultation  Pulmonary exam normal       Cardiovascular Exercise Tolerance: Good negative cardio ROS  Rhythm:regular Rate:Normal     Neuro/Psych negative neurological ROS  negative psych ROS   GI/Hepatic negative GI ROS, Neg liver ROS,   Endo/Other  negative endocrine ROS  Renal/GU negative Renal ROS  negative genitourinary   Musculoskeletal   Abdominal   Peds  Hematology negative hematology ROS (+)   Anesthesia Other Findings   Reproductive/Obstetrics negative OB ROS                          Anesthesia Physical Anesthesia Plan  ASA: II  Anesthesia Plan: General   Post-op Pain Management:    Induction: Intravenous  Airway Management Planned: Oral ETT  Additional Equipment:   Intra-op Plan:   Post-operative Plan: Extubation in OR  Informed Consent: I have reviewed the patients History and Physical, chart, labs and discussed the procedure including the risks, benefits and alternatives for the proposed anesthesia with the patient or authorized representative who has indicated his/her understanding and acceptance.   Dental Advisory Given  Plan Discussed with: CRNA and Surgeon  Anesthesia Plan Comments:         Anesthesia Quick Evaluation

## 2012-10-29 NOTE — Anesthesia Postprocedure Evaluation (Signed)
  Anesthesia Post-op Note  Patient: [name]  Procedure(s) Performed: Procedure(s) (LRB): CONVERSION OF PREVIOUS HIP SURGERY TO RIGHT TOTAL HIP AND REMOVAL OF IM NAIL  (Right)  Patient Location: PACU  Anesthesia Type: General  Level of Consciousness: awake and alert   Airway and Oxygen Therapy: Patient Spontanous Breathing  Post-op Pain: mild  Post-op Assessment: Post-op Vital signs reviewed, Patient's Cardiovascular Status Stable, Respiratory Function Stable, Patent Airway and No signs of Nausea or vomiting  Last Vitals:  Filed Vitals:   10/29/12 1505  BP: 110/46  Pulse: 103  Temp:   Resp: 16    Post-op Vital Signs: stable   Complications: No apparent anesthesia complications

## 2012-10-30 ENCOUNTER — Encounter (HOSPITAL_COMMUNITY): Payer: Self-pay | Admitting: Orthopedic Surgery

## 2012-10-30 DIAGNOSIS — D62 Acute posthemorrhagic anemia: Secondary | ICD-10-CM

## 2012-10-30 LAB — CBC
HCT: 27.6 % — ABNORMAL LOW (ref 39.0–52.0)
Hemoglobin: 9.2 g/dL — ABNORMAL LOW (ref 13.0–17.0)
MCH: 29.8 pg (ref 26.0–34.0)
MCHC: 33.3 g/dL (ref 30.0–36.0)
MCV: 89.3 fL (ref 78.0–100.0)
Platelets: 167 K/uL (ref 150–400)
RBC: 3.09 MIL/uL — ABNORMAL LOW (ref 4.22–5.81)
RDW: 13.6 % (ref 11.5–15.5)
WBC: 6.9 K/uL (ref 4.0–10.5)

## 2012-10-30 LAB — BASIC METABOLIC PANEL
CO2: 23 mEq/L (ref 19–32)
Calcium: 7.8 mg/dL — ABNORMAL LOW (ref 8.4–10.5)
Creatinine, Ser: 1.38 mg/dL — ABNORMAL HIGH (ref 0.50–1.35)
GFR calc non Af Amer: 45 mL/min — ABNORMAL LOW (ref >90)

## 2012-10-30 NOTE — Progress Notes (Signed)
Clinical Social Work Department BRIEF PSYCHOSOCIAL ASSESSMENT 10/30/2012  Patient:  [name], [name]     Account Number:  [id number]     Admit date:  10/29/2012  Clinical Social Worker:  Dennison Bulla  Date/Time:  10/30/2012 02:30 PM  Referred by:  Physician  Date Referred:  10/30/2012 Referred for  SNF Placement   Other Referral:   Interview type:  Patient Other interview type:    PSYCHOSOCIAL DATA Living Status:  FAMILY Admitted from facility:   Level of care:   Primary support name:  Britta Mccreedy Primary support relationship to patient:  SPOUSE Degree of support available:   Strong    CURRENT CONCERNS Current Concerns  Post-Acute Placement   Other Concerns:    SOCIAL WORK ASSESSMENT / PLAN CSW received referral to assist with dc planning. CSW reviewed chart and met with patient, dtrs and wife at dc. Patient agreeable to family involvement.    CSW introduced myself and explained role. Patient reports that he stayed at [facility] for about 8-9 weeks when he fractured his hip in the past. Patient reports that he would be willing to go back to [facility]. CSW explained Medicare benefits and patient understanding. CSW sent Fl2 to [facility] and spoke with admissions coordinator who is agreeable admission when patient is medically stable. CSW informed family of [facility] decision.    CSW placed FL2 in chart for MD signature and will continue to follow.   Assessment/plan status:  Psychosocial Support/Ongoing Assessment of Needs Other assessment/ plan:   Information/referral to community resources:   SNF list    PATIENT'S/FAMILY'S RESPONSE TO PLAN OF CARE: Patient alert and engaged in assessment. Patient and family agreeable to [facility] and appreciative of time.

## 2012-10-30 NOTE — Progress Notes (Signed)
Clinical Social Work  CSW attempted to meet with patient in room to discuss dc plans. Patient reports that wife and dtrs went to lunch but would want to participate in assessment. CSW left contact information and agreeable to follow up in the afternoon to meet with the entire family.   Unk Lightning, LCSW (Coverage for [employer])

## 2012-10-30 NOTE — Progress Notes (Signed)
Received order for rolling walker and commode.  Patient has received these items 02/2011.  Insurance will not cover.  Will notify family of this.

## 2012-10-30 NOTE — Progress Notes (Signed)
Utilization review completed.  

## 2012-10-30 NOTE — Progress Notes (Signed)
Physical Therapy Treatment Patient Details Name: [name] MRN: [id number] DOB: 1924/08/23 Today's Date: 10/30/2012 Time: 6213-0865 PT Time Calculation (min): 23 min  PT Assessment / Plan / Recommendation Comments on Treatment Session  2nd session. Progressed with ambulation distance but still requiring +2 for assistance. Increased pain with ROM exercises. RN made aware of need for pain meds. Recommend SNF    Follow Up Recommendations  SNF;Supervision/Assistance - 24 hour     Does the patient have the potential to tolerate intense rehabilitation     Barriers to Discharge        Equipment Recommendations  None recommended by PT    Recommendations for Other Services    Frequency Min 5X/week   Plan Discharge plan remains appropriate    Precautions / Restrictions Precautions Precautions: Posterior Hip;Fall Precaution Comments: Reviewed hip precautions/PWB status. Pt unable to recall hip precautions.  Required Braces or Orthoses: Knee Immobilizer - Right Knee Immobilizer - Right: Other (comment) (For safety/hip precautions) Restrictions Weight Bearing Restrictions: Yes RLE Weight Bearing: Partial weight bearing RLE Partial Weight Bearing Percentage or Pounds: 50%   Pertinent Vitals/Pain 3/10 R hip/groin area    Mobility  Bed Mobility Bed Mobility: Sit to Supine Sit to Supine: 4: Min assist Details for Bed Mobility Assistance: VCs safety, technique, hand placement. Assist for trunk to supine, bil LEs onto bed, to maintain safe positioning of R LE Transfers Transfers: Sit to Stand;Stand to Sit Sit to Stand: 2: Max assist;From chair/3-in-1;With upper extremity assist;With armrests Stand to Sit: 2: Max assist;To bed;With upper extremity assist Details for Transfer Assistance: VCs safety, technique, hand placement, R LE positioning. Assist to rise, stabilize, control descent, slide R foot forward before sitting.  Ambulation/Gait Ambulation/Gait Assistance: 1: +2 Total  assist Ambulation/Gait: Patient Percentage: 80% Ambulation Distance (Feet): 35 Feet Assistive device: Rolling walker Ambulation/Gait Assistance Details: VCS safety, technique, sequence, R foot flat (pt prefers to stay up on ball of foot). Fatigues fairly easily.  Gait Pattern: Step-to pattern;Decreased stride length;Decreased step length - right;Decreased weight shift to right    Exercises Total Joint Exercises Ankle Circles/Pumps: AROM;Both;10 reps;Supine Heel Slides: AAROM;Right;10 reps;Supine Hip ABduction/ADduction: AAROM;Right;10 reps;Supine   PT Diagnosis:    PT Problem List:   PT Treatment Interventions:     PT Goals Acute Rehab PT Goals Pt will go Sit to Supine/Side: with min assist PT Goal: Sit to Supine/Side - Progress: Progressing toward goal Pt will go Sit to Stand: with min assist PT Goal: Sit to Stand - Progress: Progressing toward goal Pt will Ambulate: 51 - 150 feet;with min assist;with rolling walker PT Goal: Ambulate - Progress: Progressing toward goal Pt will Perform Home Exercise Program: with supervision, verbal cues required/provided PT Goal: Perform Home Exercise Program - Progress: Progressing toward goal  Visit Information  Last PT Received On: 10/30/12 Assistance Needed: +2    Subjective Data  Subjective: Its hurting now Patient Stated Goal: Rehab for a week then home   Cognition  Cognition Overall Cognitive Status: Appears within functional limits for tasks assessed/performed Arousal/Alertness: Awake/alert Orientation Level: Appears intact for tasks assessed Behavior During Session: [facility] for tasks performed    Balance     End of Session PT - End of Session Equipment Utilized During Treatment: Right knee immobilizer Activity Tolerance: Patient limited by fatigue Patient left: in bed;with call bell/phone within reach;with family/visitor present   GP     Rebeca Alert, MPT Pager: [phone]

## 2012-10-30 NOTE — Progress Notes (Signed)
   Subjective: 1 Day Post-Op Procedure(s) (LRB): CONVERSION OF PREVIOUS HIP SURGERY TO RIGHT TOTAL HIP AND REMOVAL OF IM NAIL  (Right)   Patient reports pain as mild, pain well controlled. No events throughout the night. Feels better than the hip did prior to surgery.Discussed the possibility of SNF versus home and that we'll have to see how he progresses in the hospital.   Objective:   VITALS:   Filed Vitals:   10/30/12 0555  BP: 131/68  Pulse: 104  Temp: 97.9 F (36.6 C)  Resp: 16    Neurovascular intact Dorsiflexion/Plantar flexion intact Incision: dressing C/D/I No cellulitis present Compartment soft  LABS  Recent Labs  10/29/12 1020 10/30/12 0446  HGB 11.8* 9.2*  HCT 35.8* 27.6*  WBC 5.4 6.9  PLT 181 167     Recent Labs  10/29/12 1020 10/30/12 0446  NA 141 139  K 4.3 4.4  BUN 24* 21  CREATININE 1.33 1.38*  GLUCOSE 106* 95     Assessment/Plan: 1 Day Post-Op Procedure(s) (LRB): CONVERSION OF PREVIOUS HIP SURGERY TO RIGHT TOTAL HIP AND REMOVAL OF IM NAIL  (Right) Foley cath d/c'ed HV drain d/c'ed Advance diet Up with therapy D/C IV fluids Discharge home with home health vs SNF,   Expected ABLA  Treated with iron and will observe      Anastasio Auerbach. Babish   PAC  10/30/2012, 7:41 AM

## 2012-10-30 NOTE — Progress Notes (Signed)
Clinical Social Work Department CLINICAL SOCIAL WORK PLACEMENT NOTE 10/30/2012  Patient:  [name], [name]  Account Number:  [id number] Admit date:  10/29/2012  Clinical Social Worker:  Unk Lightning, LCSW  Date/time:  10/30/2012 02:00 PM  Clinical Social Work is seeking post-discharge placement for this patient at the following level of care:   SKILLED NURSING   (*CSW will update this form in Epic as items are completed)   10/30/2012  Patient/family provided with [facility] Department of Clinical Social Work's list of facilities offering this level of care within the geographic area requested by the patient (or if unable, by the patient's family).  10/30/2012  Patient/family informed of their freedom to choose among providers that offer the needed level of care, that participate in Medicare, Medicaid or managed care program needed by the patient, have an available bed and are willing to accept the patient.  10/30/2012  Patient/family informed of [facility]' ownership interest in [facility], as well as of the fact that they are under no obligation to receive care at this facility.  PASARR submitted to EDS on existing # PASARR number received from EDS on   FL2 transmitted to all facilities in geographic area requested by pt/family on  10/30/2012 FL2 transmitted to all facilities within larger geographic area on   Patient informed that his/her managed care company has contracts with or will negotiate with  certain facilities, including the following:     Patient/family informed of bed offers received:  10/30/2012 Patient chooses bed at [place], [facility] Physician recommends and patient chooses bed at    Patient to be transferred to [facility] on   Patient to be transferred to facility by   The following physician request were entered in Epic:   Additional Comments:

## 2012-10-30 NOTE — Evaluation (Signed)
Occupational Therapy Evaluation Patient Details Name: [name] MRN: [id number] DOB: 06-Mar-1925 Today's Date: 10/30/2012 Time: 4098-1191 OT Time Calculation (min): 16 min  OT Assessment / Plan / Recommendation Clinical Impression  Pt presents to OT s/p R THA (revision). Pt with decreased I with ADL activity. Pt will benefit from skilled OT to increase I with ADL activity to regain PLOF and decrease burden on wife    OT Assessment  Patient needs continued OT Services    Follow Up Recommendations  Other (comment);SNF (pt does have wife to A upon DC)       Equipment Recommendations  None recommended by OT    Recommendations for Other Services Rehab consult  Frequency  Min 2X/week    Precautions / Restrictions Precautions Precautions: Posterior Hip;Fall Precaution Comments: Reviewed hip precautions and PWB status Required Braces or Orthoses: Knee Immobilizer - Right Knee Immobilizer - Right: Other (comment) (For safety/hip precautions) Restrictions Weight Bearing Restrictions: Yes RLE Weight Bearing: Partial weight bearing RLE Partial Weight Bearing Percentage or Pounds: 50%       ADL  Grooming: Simulated;Set up;Wash/dry face Where Assessed - Grooming: Unsupported sitting Upper Body Dressing: Simulated;Set up Where Assessed - Upper Body Dressing: Unsupported sitting Lower Body Dressing: Simulated;Maximal assistance Where Assessed - Lower Body Dressing: Supported sit to stand Toilet Transfer: Performed Toilet Transfer Method: Sit to stand;Other (comment) (with urinal) Toileting - Clothing Manipulation and Hygiene: Performed;+1 Total assistance;Other (comment) (total A to hold urinal as pts hands on walker) Where Assessed - Toileting Clothing Manipulation and Hygiene: Standing Transfers/Ambulation Related to ADLs: Disccusion with pt and wife regarding need for ST SNF.  Pts wife agrees.  Feel pt will also agree    OT Diagnosis: Generalized weakness  OT Problem List:  Decreased strength;Decreased activity tolerance;Decreased safety awareness;Impaired balance (sitting and/or standing) OT Treatment Interventions: Self-care/ADL training;Patient/family education;DME and/or AE instruction   OT Goals Acute Rehab OT Goals OT Goal Formulation: With patient Time For Goal Achievement: 11/13/12 Potential to Achieve Goals: Good ADL Goals Pt Will Perform Grooming: with supervision;Standing at sink ADL Goal: Grooming - Progress: Goal set today Pt Will Perform Lower Body Dressing: with supervision;Sit to stand from chair;with adaptive equipment ADL Goal: Lower Body Dressing - Progress: Goal set today Pt Will Transfer to Toilet: with supervision;Maintaining hip precautions;Comfort height toilet ADL Goal: Toilet Transfer - Progress: Goal set today Pt Will Perform Toileting - Clothing Manipulation: with set-up;Standing ADL Goal: Toileting - Clothing Manipulation - Progress: Goal set today  Visit Information  Last OT Received On: 10/30/12 Assistance Needed: +2    Subjective Data  Subjective: I know i need to go to rehab- but I sure dont want too   Prior Functioning     Home Living Lives With: Spouse Type of Home: House Home Access: Stairs to enter Entrance Stairs-Rails: Right;Left Home Layout: One level Bathroom Shower/Tub: Health visitor: Standard Home Adaptive Equipment: Grab bars in shower;Grab bars around toilet;Wheelchair - manual;Walker - rolling;Bedside commode/3-in-1;Tub transfer bench;Straight cane Prior Function Level of Independence: Independent with assistive device(s) Able to Take Stairs?: Yes Vocation: Retired Musician: No difficulties         Vision/Perception Vision - History Patient Visual Report: No change from baseline   Cognition  Cognition Overall Cognitive Status: Appears within functional limits for tasks assessed/performed Arousal/Alertness: Awake/alert Orientation Level: Appears  intact for tasks assessed Behavior During Session: [facility] for tasks performed    Extremity/Trunk Assessment Right Upper Extremity Assessment RUE ROM/Strength/Tone: [facility] for tasks assessed Left Upper  Extremity Assessment LUE ROM/Strength/Tone: WFL for tasks assessed     Mobility Transfers Transfers: Sit to Stand Sit to Stand: From chair/3-in-1;2: Max assist Stand to Sit: To chair/3-in-1;2: Max assist           End of Session OT - End of Session Activity Tolerance: Patient tolerated treatment well Patient left: in chair;with call bell/phone within reach Nurse Communication: Mobility status  GO     Alba Cory 10/30/2012, 3:07 PM

## 2012-10-30 NOTE — Evaluation (Signed)
Physical Therapy Evaluation Patient Details Name: [name] MRN: [id number] DOB: 1925/07/22 Today's Date: 10/30/2012 Time: 1010-1029 PT Time Calculation (min): 19 min  PT Assessment / Plan / Recommendation Clinical Impression  79 yo male s/p conversion to R THA, removal of IM nail. On eval, pt required +2 assist for safe mobility. Pt was able to ambulate ~15 feet with RW. Discussed d/c plan-pt states he may go to rehab for a week. Recommend SNF for continued rehab.     PT Assessment  Patient needs continued PT services    Follow Up Recommendations  SNF;Supervision/Assistance - 24 hour    Does the patient have the potential to tolerate intense rehabilitation      Barriers to Discharge        Equipment Recommendations  None recommended by PT    Recommendations for Other Services OT consult   Frequency Min 5X/week    Precautions / Restrictions Precautions Precautions: Posterior Hip;Fall Precaution Comments: Reviewed hip precautions and PWB status Required Braces or Orthoses: Knee Immobilizer - Right Knee Immobilizer - Right: Other (comment) (For safety/hip precautions) Restrictions Weight Bearing Restrictions: Yes RLE Weight Bearing: Partial weight bearing RLE Partial Weight Bearing Percentage or Pounds: 50%   Pertinent Vitals/Pain Pt denied pain      Mobility  Bed Mobility Bed Mobility: Supine to Sit Supine to Sit: 1: +2 Total assist;HOB elevated;With rails Supine to Sit: Patient Percentage: 50% Details for Bed Mobility Assistance: VCs safety, technique, hand placement. Assist for trunk to upright, bil LEs off bed, to maintain safe positioning of R LE Transfers Transfers: Sit to Stand;Stand to Sit Sit to Stand: 2: Max assist;From bed;With upper extremity assist Stand to Sit: 3: Mod assist;To chair/3-in-1;With upper extremity assist Details for Transfer Assistance: VCs safety, technique, hand placement, R LE positioning. Assist to rise, stabilize, control  descent, slide R foot forward before sitting.  Ambulation/Gait Ambulation/Gait Assistance: 1: +2 Total assist Ambulation/Gait: Patient Percentage: 80% Ambulation Distance (Feet): 15 Feet Assistive device: Rolling walker Ambulation/Gait Assistance Details: VCS safety, technique, sequence, adherence to PWB. Pt did well with WBing. Fatigues fairly easily. Recliner for pt to sit. Gait Pattern: Step-to pattern;Decreased stride length;Decreased step length - right;Decreased weight shift to right    Exercises     PT Diagnosis: Difficulty walking;Abnormality of gait;Generalized weakness;Acute pain  PT Problem List: Decreased range of motion;Decreased strength;Decreased activity tolerance;Decreased balance;Decreased mobility;Decreased knowledge of precautions;Decreased knowledge of use of DME PT Treatment Interventions: DME instruction;Gait training;Functional mobility training;Therapeutic activities;Balance training;Therapeutic exercise;Patient/family education   PT Goals Acute Rehab PT Goals PT Goal Formulation: With patient/family Time For Goal Achievement: 11/06/12 Potential to Achieve Goals: Good Pt will go Supine/Side to Sit: with min assist PT Goal: Supine/Side to Sit - Progress: Goal set today Pt will go Sit to Supine/Side: with min assist PT Goal: Sit to Supine/Side - Progress: Goal set today Pt will go Sit to Stand: with min assist PT Goal: Sit to Stand - Progress: Goal set today Pt will Ambulate: 51 - 150 feet;with min assist;with rolling walker PT Goal: Ambulate - Progress: Goal set today Pt will Perform Home Exercise Program: with supervision, verbal cues required/provided PT Goal: Perform Home Exercise Program - Progress: Goal set today  Visit Information  Last PT Received On: 10/30/12 Assistance Needed: +2 (safety)    Subjective Data  Subjective: So, how'd I do? Patient Stated Goal: Rehab for a week then home   Prior Functioning  Home Living Lives With: Spouse Type of  Home: House Home Access: Stairs  to enter Entrance Stairs-Rails: Right;Left Home Layout: One level Bathroom Shower/Tub: Naval architect Equipment: Grab bars in shower;Grab bars around toilet;Wheelchair - manual;Walker - rolling;Bedside commode/3-in-1;Tub transfer bench;Straight cane Prior Function Level of Independence: Independent with assistive device(s) Able to Take Stairs?: Yes Communication Communication: No difficulties    Cognition  Cognition Overall Cognitive Status: Appears within functional limits for tasks assessed/performed Arousal/Alertness: Awake/alert Orientation Level: Appears intact for tasks assessed Behavior During Session: [facility] for tasks performed    Extremity/Trunk Assessment Right Lower Extremity Assessment RLE ROM/Strength/Tone: Deficits RLE ROM/Strength/Tone Deficits: hip abd 2/5, moves ankle well. KI left in place for safety so other muscles not tested Left Lower Extremity Assessment LLE ROM/Strength/Tone: [facility] for tasks assessed Trunk Assessment Trunk Assessment: Kyphotic   Balance    End of Session PT - End of Session Equipment Utilized During Treatment: Right knee immobilizer Activity Tolerance: Patient limited by fatigue Patient left: in chair;with call bell/phone within reach;with family/visitor present  GP     Rebeca Alert, MPT Pager: [phone]

## 2012-10-30 NOTE — Op Note (Signed)
[name], [name]              ACCOUNT NO.:  [id number]  MEDICAL RECORD NO.:  [id number]  LOCATION:  1605                         FACILITY:  [facility]  PHYSICIAN:  Madlyn Frankel. Charlann Boxer, M.D.  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  COMPONENTS USED:  DePuy hip system with size 52 pinnacle cup 36+ 4 neutral AltrX liner, 2 cancellous screws into the ilium.  A size 6 standard Tri Lock stem with 36+ 1.5 metal ball.  Note that the femoral head was ground up.  The patient's native femoral head was ground up with a reamer and packed into the defect created from the lag screw into the anterior acetabular region.  SURGEON:  Madlyn Frankel. Charlann Boxer, M.D.  ASSISTANT:  Lanney Gins, PA-C.  Note, that Mr. Carmon Sails was present for the entirety of the case from preoperative positioning, perioperative management of extremity. General facilitation of the case and primary wound closure.  ANESTHESIA:  General.  SPECIMENS:  None.  DRAINS:  One medium Hemovac into the right hip.  COMPLICATIONS:  None apparent.  BLOOD LOSS:  About 450 mL.  INDICATION FOR PROCEDURE:  Mr. Fults is an 79 year old male with a history of a previous right intertrochanteric femur fracture fixed by my partner, Dr. Ronnie Doss in 2012.  He had apparently done well clinically but presented recently with a month or 2 history of progressive worsening right  hip pain and eventual inability to bear weight.  Radiographs at the time of that evaluation revealed a failure of this fracture fixation with penetration of the femoral lag screw through the femoral head into the acetabulum.  I was subsequently asked to help with the definitive management.  After reviewing risks and benefits and necessity of the procedure with he and his family, consent was obtained for benefit of pain management and allowing for functional improvement.  The necessity of a total hip replacement was reviewed as well.  Consent was obtained.  PROCEDURE IN DETAIL:  The patient was brought to operative theater. Once adequate anesthesia, preoperative antibiotics, and Ancef administered, he was positioned into the left lateral decubitus position with the right side up.  The right lower extremity was then prepped and draped in sterile fashion.  A time-out was performed identifying the patient, planned procedure, and extremity.  The time-out was performed identifying the patient, planned procedure, and extremity.  The right hip was prepped and draped in the sterile fashion from the iliac crest area down to the knee and the need to remove the 2 distal interlocks.  The attention was first directed to the interlocks.  An incision was made, incorporating the 2 distal interlock incision.  The iliotibial band was split.  The 2 screws were identified and removed without difficulty.  We irrigated this wound out.  This wound would be closed in layers with #1 Vicryl, the iliotibial band 2-0 Vicryl, the subcu layer, and staples on the skin.  Proximally, a standard incision for posterior approach to the hip was carried out, and perhaps treated slightly anterior for identification of the both the lag screw as well as the proximal aspect of the nail. Following dissection removal of reactive bursitis around the lateral aspect of the hip consistent with the lag screw positioned, the  lag screw was identified.  Once it was identified, a proximally split gluteus medius tendon as it inserts to the trochanter and was able to identify the proximal aspect of the nail.  Following removal of soft tissue and bone around this area, I was able to remove the locking bolts of this compression screw device and then inserted the nail extraction bolts.  Once this was done, the lag screw was removed without difficulty.  I was then able to backslap about the nail without any issues or complications.  At this point, we irrigated these wounds.  I reapproximated the gluteal fascia using #1 Vicryl, reapproximated the tensor fascia overlying the area where the lag screw had placed.  At this point, attention was now directed to the femoral preparation. The femur was exposed for posterior approach, taken down the posterior capsule.  Once the hip was exposed, the 4 shortened neck identified. The hip was easily dislocated without complication.  Identified nonunion in the standard stroke area at this point.  Following a neck cut, I removed the old femoral head and neck, and then the remaining neck segment that had not united to the shaft of the femur.  Once this was moved, though the hip was tight, I was able to flex and internally rotate.  Despite this being inotropic segment, I did use a Tri Lock system.  Following debridement of the canal and irrigating the canal, I began broachment with a size 0 broach and broached up to a size 6 broach with approximately 20-25 anteversion into this system.  There was good medial and lateral support.  At this point, I packed off the femur and attended to the acetabulum.  Acetabular exposure was obtained.  The labral tissue debrided as well as foveal tissue.  I began reaming with 47 reamers.  He was already down medially enough, I just reamed up to 51 reamer for circumferential fit used 52 cup.  Following reaming up to 51 reamer, I used the 45 reamer and  reamed out the native femoral head bone for which I have then used as autograft to pack into the acetabular defect created by lag screw.  Once this was packed in, a 52 pinnacle shell was then impacted.  It began about the prior 20 degrees of forward flexion.  Two cancellous screws were placed into the ilium, once the knee was well seated.  I placed a 36 +4 neutral AltrX liner to match the 52 shell.  At this point, a trial reduction was carried out with a 6 broach impacted into position.  A standard neck and a 361.5 ball.  The hip was very tight as he was preoperatively related to limited motion shortening.  Based on the trial reduction the stability of the hip identified at this point, the trial components were removed.  The final 6 standard stem was opened.  We then impacted the 6 standard stem  was sat basically at the level of the broach was without any torsional movement.  I selected a 361.5 ball.  The hip was reduced.  Again, the hip was stiff.  I did not think that I could get any more length data at this point in time, and we will have to continue this at this point to work or improve in his flexibility.  Following places of final hip ball, we irrigated the wound,  as we had done throughout the case.  I reapproximated the pseudocapsular tissues posteriorly to themselves.  I placed a medium Hemovac drain deep.  At this point, the iliotibial band was reapproximated over to the gluteal fascia in a combination of interrupted and running sutures.  The remainder of this wound was closed with 2-0 Vicryl and staples on the skin.  The skin was cleaned, dried, and dressed sterilely using Mepilex dressing distally and Aquacel dressing proximally.  Drain site was dressed separately.  The patient was then brought to recovery room in stable condition tolerating the procedure well.  Findings reviewed with family.     Madlyn Frankel Charlann Boxer, M.D.     MDO/MEDQ  D:  10/29/2012  T:   10/30/2012  Job:  [id number]

## 2012-10-30 NOTE — Progress Notes (Signed)
Nutrition Brief Note  Patient identified on the Malnutrition Screening Tool (MST) Report  Body mass index is 21.32 kg/(m^2). Patient meets criteria for normal weight based on current BMI.   Current diet order is Regular, patient is consuming approximately 90-100% of meals at this time. Pt states that he has had a decreased interest in eating for the past 6 weeks but, still has a normal appetite and eats 3 meals daily. Pt states that he weighed 162 lbs 2 years ago and has been able to maintain wt at 132 lbs for a while now. Emphasized the importance of adequate calories and protein during rehab and recovery. Discussed ways to increase calories and protein in the diet. Pt not interested in supplements. Pt has no questions or concerns and feels he can continue with good po intake.Labs and medications reviewed.   No nutrition interventions warranted at this time. If nutrition issues arise, please consult RD.   [name] RD, LDN Inpatient Clinical Dietitian Pager: [phone] After Hours Pager: [phone]

## 2012-10-30 NOTE — Care Management Note (Signed)
    Page 1 of 1   10/30/2012     6:07:08 PM   CARE MANAGEMENT NOTE 10/30/2012  Patient:  [name]   Account Number:  [id number]  Date Initiated:  10/30/2012  Documentation initiated by:  Colleen Can  Subjective/Objective Assessment:   dx failoed rt hip arthroplasty: conversion rt total hip     Action/Plan:   SNF rehab   Anticipated DC Date:  11/01/2012   Anticipated DC Plan:  SKILLED NURSING FACILITY  In-house referral  Clinical Social Worker      DC Planning Services  CM consult      Choice offered to / List presented to:             Status of service:  Completed, signed off Medicare Important Message given?  NA - LOS <3 / Initial given by admissions (If response is "NO", the following Medicare IM given date fields will be blank) Date Medicare IM given:   Date Additional Medicare IM given:    Discharge Disposition:    Per UR Regulation:    If discussed at Long Length of Stay Meetings, dates discussed:    Comments:

## 2012-10-31 LAB — BASIC METABOLIC PANEL
Calcium: 8 mg/dL — ABNORMAL LOW (ref 8.4–10.5)
Chloride: 107 mEq/L (ref 96–112)
Creatinine, Ser: 1.43 mg/dL — ABNORMAL HIGH (ref 0.50–1.35)
GFR calc Af Amer: 50 mL/min — ABNORMAL LOW (ref >90)
Sodium: 137 mEq/L (ref 135–145)

## 2012-10-31 LAB — CBC
MCV: 88.9 fL (ref 78.0–100.0)
Platelets: 153 10*3/uL (ref 150–400)
RDW: 13.7 % (ref 11.5–15.5)
WBC: 7.9 10*3/uL (ref 4.0–10.5)

## 2012-10-31 MED ORDER — HYDROCODONE-ACETAMINOPHEN 7.5-325 MG PO TABS
1.0000 | ORAL_TABLET | ORAL | Status: DC | PRN
  Administered 2012-10-31 – 2012-11-01 (×3): 1 via ORAL
  Filled 2012-10-31 (×3): qty 1

## 2012-10-31 NOTE — Progress Notes (Signed)
Pt has ST Rehab bed at [facility] on [address]. If stable for d/c. Pt has been updated. CSW will follow to assist with d/c planning to SNF.  Cori Razor LCSW [phone]

## 2012-10-31 NOTE — Progress Notes (Signed)
Physical Therapy Treatment Patient Details Name: [name] MRN: [id number] DOB: 07/05/1925 Today's Date: 10/31/2012 Time: 7829-5621 PT Time Calculation (min): 13 min  PT Assessment / Plan / Recommendation Comments on Treatment Session  progressing slowly with mobility.  Continue to recommend SNF.     Follow Up Recommendations  SNF;Supervision/Assistance - 24 hour     Does the patient have the potential to tolerate intense rehabilitation     Barriers to Discharge        Equipment Recommendations  None recommended by PT    Recommendations for Other Services OT consult  Frequency Min 5X/week   Plan Discharge plan remains appropriate    Precautions / Restrictions Precautions Precautions: Posterior Hip;Fall Precaution Comments: Reviewed hip precautions/PWB status. Pt only able to recall 1/3 precautions.  Restrictions Weight Bearing Restrictions: Yes RLE Weight Bearing: Partial weight bearing RLE Partial Weight Bearing Percentage or Pounds: 50%   Pertinent Vitals/Pain 5/10 R hip    Mobility  Bed Mobility Bed Mobility: Sit to Supine Sit to Supine: 1: +2 Total assist Sit to Supine: Patient Percentage: 30% Details for Bed Mobility Assistance: VCs safety, technique, hand placement. Assist for trunk to supine, bil LEs onto bed, to maintain safe positioning of R LE. Increased difficulty with bed mobility today.  Transfers Transfers: Sit to Stand;Stand to Sit Sit to Stand: 2: Max assist;From chair/3-in-1 Stand to Sit: 2: Max assist;To bed Details for Transfer Assistance: VCs safety, technique, hand placement, R LE positioning. Assist to rise, stabilize, control descent, slide R foot forward before sitting.  Ambulation/Gait Ambulation/Gait Assistance: 1: +2 Total assist Ambulation/Gait: Patient Percentage: 80% Ambulation Distance (Feet): 40 Feet Assistive device: Rolling walker Ambulation/Gait Assistance Details: VCs safety, technique, sequence. Assist to stabilize  throughout ambulation. fatigues fairly easily Gait Pattern: Step-to pattern;Trunk flexed;Antalgic;Decreased stride length;Decreased step length - right    Exercises     PT Diagnosis:    PT Problem List:   PT Treatment Interventions:     PT Goals Acute Rehab PT Goals Pt will go Supine/Side to Sit: with min assist PT Goal: Supine/Side to Sit - Progress: Progressing toward goal Pt will go Sit to Supine/Side: with min assist PT Goal: Sit to Supine/Side - Progress: Progressing toward goal Pt will go Sit to Stand: with min assist PT Goal: Sit to Stand - Progress: Progressing toward goal Pt will Ambulate: 51 - 150 feet;with min assist;with rolling walker PT Goal: Ambulate - Progress: Progressing toward goal  Visit Information  Last PT Received On: 10/31/12 Assistance Needed: +2    Subjective Data  Subjective: I dont know why Im so sleepy Patient Stated Goal: Rehab for a week then home   Cognition  Cognition Overall Cognitive Status: Appears within functional limits for tasks assessed/performed Arousal/Alertness: Awake/alert Orientation Level: Appears intact for tasks assessed Behavior During Session: [facility] for tasks performed    Balance     End of Session PT - End of Session Activity Tolerance: Patient limited by pain;Patient limited by fatigue Patient left: in bed;with call bell/phone within reach; pillow between knees; bed alarm set   GP     Rebeca Alert, MPT Pager: [phone]

## 2012-10-31 NOTE — Progress Notes (Signed)
Physical Therapy Treatment Patient Details Name: [name] MRN: [id number] DOB: July 23, 1925 Today's Date: 10/31/2012 Time: 4098-1191 PT Time Calculation (min): 18 min  PT Assessment / Plan / Recommendation Comments on Treatment Session  progressing slowly with mobility. Pt reporting increased pain this session, especially with bed mobility. Family reports pt was a little confused last night and pulled out  IV and removed KI. Continue to recommend SNF.     Follow Up Recommendations  SNF;Supervision/Assistance - 24 hour     Does the patient have the potential to tolerate intense rehabilitation     Barriers to Discharge        Equipment Recommendations  None recommended by PT    Recommendations for Other Services OT consult  Frequency Min 5X/week   Plan Discharge plan remains appropriate    Precautions / Restrictions Precautions Precautions: Posterior Hip;Fall Precaution Comments: Reviewed hip precautions/PWB status. Pt only able to recall 1/3 precautions.  Restrictions Weight Bearing Restrictions: Yes RLE Weight Bearing: Partial weight bearing RLE Partial Weight Bearing Percentage or Pounds: 50%   Pertinent Vitals/Pain 5/10 R hip    Mobility  Bed Mobility Bed Mobility: Supine to Sit Supine to Sit: 1: +2 Total assist;HOB elevated Supine to Sit: Patient Percentage: 30% Details for Bed Mobility Assistance: VCs safety, technique, hand placement. Assist for trunk to supine, bil LEs onto bed, to maintain safe positioning of R LE. Increased difficulty with bed mobility today.  Transfers Transfers: Sit to Stand;Stand to Sit Sit to Stand: 2: Max assist;From bed;From elevated surface Stand to Sit: 2: Max assist;To chair/3-in-1;With armrests Details for Transfer Assistance: VCs safety, technique, hand placement, R LE positioning. Assist to rise, stabilize, control descent, slide R foot forward before sitting.  Ambulation/Gait Ambulation/Gait Assistance: 1: +2 Total  assist Ambulation/Gait: Patient Percentage: 80% Ambulation Distance (Feet): 40 Feet Assistive device: Rolling walker Ambulation/Gait Assistance Details: VCS safety, technique, sequence. Pt able to keep R foot flat and maintain PWB. Fatigues fairly easily but able to make it back to room today.  Gait Pattern: Step-to pattern;Trunk flexed;Decreased stride length;Decreased step length - right    Exercises     PT Diagnosis:    PT Problem List:   PT Treatment Interventions:     PT Goals Acute Rehab PT Goals Pt will go Supine/Side to Sit: with min assist PT Goal: Supine/Side to Sit - Progress: Progressing toward goal Pt will go Sit to Stand: with min assist PT Goal: Sit to Stand - Progress: Progressing toward goal Pt will Ambulate: 51 - 150 feet;with min assist;with rolling walker PT Goal: Ambulate - Progress: Progressing toward goal  Visit Information  Last PT Received On: 10/31/12 Assistance Needed: +2    Subjective Data  Subjective: Its hurt when I move Patient Stated Goal: Rehab for a week then home   Cognition  Cognition Overall Cognitive Status: Appears within functional limits for tasks assessed/performed Arousal/Alertness: Awake/alert Orientation Level: Appears intact for tasks assessed Behavior During Session: [facility] for tasks performed    Balance     End of Session PT - End of Session Activity Tolerance: Patient limited by pain Patient left: in chair;with call bell/phone within reach;with family/visitor present   GP     Rebeca Alert, MPT Pager: [phone]

## 2012-10-31 NOTE — Progress Notes (Signed)
   Subjective: 2 Days Post-Op Procedure(s) (LRB): CONVERSION OF PREVIOUS HIP SURGERY TO RIGHT TOTAL HIP AND REMOVAL OF IM NAIL  (Right)   Patient reports pain as mild, pain well controlled. Nurse stated that he pulled off the dressing and pulled out the IV last night. Otherwise no events.  Has decided to go to a SNF to obtain a little extra needed help prior to returning home.  Objective:   VITALS:   Filed Vitals:   10/31/12 0528  BP: 132/64  Pulse: 106  Temp: 99.5 F (37.5 C)  Resp: 16    Neurovascular intact Dorsiflexion/Plantar flexion intact Incision: dressing C/D/I No cellulitis present Compartment soft  LABS  Recent Labs  10/29/12 1020 10/30/12 0446 10/31/12 0438  HGB 11.8* 9.2* 8.7*  HCT 35.8* 27.6* 25.7*  WBC 5.4 6.9 7.9  PLT 181 167 153     Recent Labs  10/29/12 1020 10/30/12 0446 10/31/12 0438  NA 141 139 137  K 4.3 4.4 4.0  BUN 24* 21 21  CREATININE 1.33 1.38* 1.43*  GLUCOSE 106* 95 132*     Assessment/Plan: 2 Days Post-Op Procedure(s) (LRB): CONVERSION OF PREVIOUS HIP SURGERY TO RIGHT TOTAL HIP AND REMOVAL OF IM NAIL  (Right) Guaze and tape dressing, to be changed daily or as needed Up with therapy Discharge to SNF [facility]) eventually, when ready  Expected ABLA  Treated with iron and will observe      Anastasio Auerbach. Babish   PAC  10/31/2012, 9:56 AM

## 2012-11-01 LAB — BASIC METABOLIC PANEL
CO2: 23 mEq/L (ref 19–32)
Calcium: 8.2 mg/dL — ABNORMAL LOW (ref 8.4–10.5)
GFR calc non Af Amer: 44 mL/min — ABNORMAL LOW (ref >90)
Sodium: 137 mEq/L (ref 135–145)

## 2012-11-01 MED ORDER — DSS 100 MG PO CAPS: 100.0000 mg | ORAL_CAPSULE | Freq: Two times a day (BID) | ORAL | Status: DC

## 2012-11-01 MED ORDER — POLYETHYLENE GLYCOL 3350 17 G PO PACK: 17.0000 g | PACK | Freq: Two times a day (BID) | ORAL | Status: DC

## 2012-11-01 MED ORDER — FERROUS SULFATE 325 (65 FE) MG PO TABS: 325.0000 mg | ORAL_TABLET | Freq: Three times a day (TID) | ORAL | Status: DC

## 2012-11-01 MED ORDER — ASPIRIN EC 325 MG PO TBEC: 325.0000 mg | DELAYED_RELEASE_TABLET | Freq: Every day | ORAL | Status: DC

## 2012-11-01 MED ORDER — TIZANIDINE HCL 4 MG PO CAPS: 4.0000 mg | ORAL_CAPSULE | Freq: Three times a day (TID) | ORAL | Status: DC

## 2012-11-01 MED ORDER — RIVAROXABAN 10 MG PO TABS: 10.0000 mg | ORAL_TABLET | ORAL | Status: DC

## 2012-11-01 MED ORDER — HYDROCODONE-ACETAMINOPHEN 7.5-325 MG PO TABS: 1.0000 | ORAL_TABLET | ORAL | Status: DC | PRN

## 2012-11-01 NOTE — Progress Notes (Addendum)
Patient is set to discharge to [facility] today. Patient & wife, Britta Mccreedy aware. [facility] called for 12:30 pickup.   Clinical Social Work Department CLINICAL SOCIAL WORK PLACEMENT NOTE 11/01/2012  Patient:  MEHMET, SCALLY  Account Number:  [id number] Admit date:  10/29/2012  Clinical Social Worker:  Unk Lightning, LCSW  Date/time:  10/30/2012 02:00 PM  Clinical Social Work is seeking post-discharge placement for this patient at the following level of care:   SKILLED NURSING   (*CSW will update this form in Epic as items are completed)   10/30/2012  Patient/family provided with [facility] Department of Clinical Social Work's list of facilities offering this level of care within the geographic area requested by the patient (or if unable, by the patient's family).  10/30/2012  Patient/family informed of their freedom to choose among providers that offer the needed level of care, that participate in Medicare, Medicaid or managed care program needed by the patient, have an available bed and are willing to accept the patient.  10/30/2012  Patient/family informed of [facility]' ownership interest in [facility], as well as of the fact that they are under no obligation to receive care at this facility.  PASARR submitted to EDS on  PASARR number received from EDS on   FL2 transmitted to all facilities in geographic area requested by pt/family on  10/30/2012 FL2 transmitted to all facilities within larger geographic area on   Patient informed that his/her managed care company has contracts with or will negotiate with  certain facilities, including the following:     Patient/family informed of bed offers received:  10/30/2012 Patient chooses bed at [place], [facility] Physician recommends and patient chooses bed at    Patient to be transferred to [place], [facility] on  11/01/2012 Patient to be transferred to facility by [facility]  The following physician  request were entered in Epic:   Additional Comments:  Unice Bailey, LCSW [facility] Clinical Social Worker cell #: [phone]

## 2012-11-01 NOTE — Discharge Summary (Signed)
Physician Discharge Summary  Patient ID: ELLISON RIETH MRN: [id number] DOB/AGE: 1925-02-20 79 y.o.  Admit date: 10/29/2012 Discharge date:  11/01/2012  Procedures:  Procedure(s) (LRB): CONVERSION OF PREVIOUS HIP SURGERY TO RIGHT TOTAL HIP AND REMOVAL OF IM NAIL  (Right)  Attending Physician:  Dr. Durene Romans   Admission Diagnoses:   Right hip pain s/p IM nail   Discharge Diagnoses:  Principal Problem:   S/P right TH revision Active Problems:   Expected blood loss anemia  Diagnosis  . Gout  . Insomnia  . BPH (benign prostatic hyperplasia)  . Kidney disorder  . Complication of anesthesia - difficult to wake  . Arthritis  . Hypertension    HPI: [name], 79 y.o. male, has a history of pain and functional disability in the right hip which has increased in the right hip and groin pain, denies any new injury. S/P right hip IM nail 04/09/2011. Complaining of a fall several weeks ago but only noticed worsening pain over the past 2 weeks. States he was doing well until then. He states therapy had been going well until 2 weeks ago. Pain at night, pain with weightbearing, difficulty ambulating and difficulty arising from chair. The patient feels that they are doing poorly and report their pain level to be moderate to severe. Current treatment includes: modified weightbearing (walker), relative rest and pain medications. The following medication has been used for pain control: tramadol. Patient has evidence of protruding compression screw from IM nail into acetabulum by imaging studies. This condition presents safety issues increasing the risk of falls. There is no current active infection. Risks, benefits and expectations were discussed with the patient. Patient understand the risks, benefits and expectations and wishes to proceed with surgery.  PCP: Rogelia Boga, MD   Discharged Condition: good  Hospital Course:  Patient underwent the above stated procedure on  10/29/2012. Patient tolerated the procedure well and brought to the recovery room in good condition and subsequently to the floor.  POD #1 BP: 131/68 ; Pulse: 104 ; Temp: 97.9 F (36.6 C) ; Resp: 16 Pt's foley was removed, as well as the hemovac drain removed. IV was changed to a saline lock. Patient reports pain as mild, pain well controlled. No events throughout the night. Feels better than the hip did prior to surgery.Discussed the possibility of SNF versus home and that we'll have to see how he progresses in the hospital.  Neurovascular intact, dorsiflexion/plantar flexion intact, incision: dressing C/D/I, no cellulitis present and compartment soft.   LABS  Basename  10/30/12    0446   HGB  9.2  HCT  27.6   POD #2  BP: 132/64 ; Pulse: 106 ; Temp: 99.5 F (37.5 C) ; Resp: 16  Patient reports pain as mild, pain well controlled. Nurse stated that he pulled off the dressing and pulled out the IV last night. Otherwise no events. Has decided to go to a SNF to obtain a little extra needed help prior to returning home. Neurovascular intact, dorsiflexion/plantar flexion intact, incision: dressing C/D/I, no cellulitis present and compartment soft.   LABS  Basename  10/31/12    0438   HGB  8.7  HCT  25.7   POD #3  BP: 134/67 ; Pulse: 97 ; Temp: 98.4 F (36.9 C) ; Resp: 16 Patient reports pain as mild. Patient and family report confusion but this morning he is oriented, seems awake and alert. Reports some left hip pain this am. Up with therapy with  progression. Neurovascular intact, incision: dressing C/D/I right hip and distal wounds and left hip some pain with palpation but not as much with motion.  LABS   No new labs   Discharge Exam: General appearance: alert, cooperative and no distress Extremities: Homans sign is negative, no sign of DVT, no edema, redness or tenderness in the calves or thighs and no ulcers, gangrene or trophic changes  Disposition:   SNF with follow up in 2  weeks   Follow-up Information   Follow up with Shelda Pal, MD. Schedule an appointment as soon as possible for a visit in 2 weeks.   Contact information:   [address] [place] [address] [phone]       Discharge Orders   Future Appointments [facility] Department Dept Phone   04/11/2013 1:00 PM Gordy Savers, MD [facility] [phone]   Future Orders Complete By Expires     Call MD / Call 911  As directed     Comments:      If you experience chest pain or shortness of breath, CALL 911 and be transported to the hospital emergency room.  If you develope a fever above 101 F, pus (white drainage) or increased drainage or redness at the wound, or calf pain, call your surgeon's office.    Change dressing  As directed     Comments:      Daily dressing changes with 4x4 guaze and tape. Keep the area dry and clean.    Constipation Prevention  As directed     Comments:      Drink plenty of fluids.  Prune juice may be helpful.  You may use a stool softener, such as Colace (over the counter) 100 mg twice a day.  Use MiraLax (over the counter) for constipation as needed.    Diet - low sodium heart healthy  As directed     Discharge instructions  As directed     Comments:      Daily dressing changes with gauze and tape. Keep the area dry and clean until follow up. Follow up in 2 weeks at [facility]. Call with any questions or concerns.    TED hose  As directed     Comments:      Use stockings (TED hose) for 2 weeks on both leg(s).  You may remove them at night for sleeping.         Medication List    STOP taking these medications       acetaminophen 325 MG tablet  Commonly known as:  TYLENOL     traMADol 50 MG tablet  Commonly known as:  ULTRAM      TAKE these medications       allopurinol 100 MG tablet  Commonly known as:  ZYLOPRIM  Take 1 tablet (100 mg total) by mouth daily.     aspirin EC 325 MG tablet  Take 1  tablet (325 mg total) by mouth daily.     carboxymethylcellulose 0.5 % Soln  Commonly known as:  REFRESH PLUS  1 drop 3 (three) times daily as needed. Patient uses as needed     DSS 100 MG Caps  Take 100 mg by mouth 2 (two) times daily.     ferrous sulfate 325 (65 FE) MG tablet  Take 1 tablet (325 mg total) by mouth 3 (three) times daily after meals.     HYDROcodone-acetaminophen 7.5-325 MG per tablet  Commonly known as:  NORCO  Take 1-2 tablets by mouth every 4 (four) hours as needed for pain.     OVER THE COUNTER MEDICATION  Apply 1 application to eye daily as needed (for eyes). [employer] brand lubricant eye ointment as needed     polyethylene glycol packet  Commonly known as:  MIRALAX / GLYCOLAX  Take 17 g by mouth 2 (two) times daily.     rivaroxaban 10 MG Tabs tablet  Commonly known as:  XARELTO  Take 1 tablet (10 mg total) by mouth daily.     tamsulosin 0.4 MG Caps  Commonly known as:  FLOMAX  Take 1 capsule (0.4 mg total) by mouth daily after supper.     tiZANidine 4 MG capsule  Commonly known as:  ZANAFLEX  Take 1 capsule (4 mg total) by mouth 3 (three) times daily. Muscle spasms         Signed: Anastasio Auerbach. Babish   PAC  11/01/2012, 8:19 AM

## 2012-11-01 NOTE — Progress Notes (Signed)
Report called to betty RN at [facility]  D Everson RN

## 2012-11-01 NOTE — Progress Notes (Signed)
Physical Therapy Treatment Patient Details Name: [name] MRN: [id number] DOB: [birth year] Today's Date: 11/01/2012 Time: 1029-1050 PT Time Calculation (min): 21 min  PT Assessment / Plan / Recommendation Comments on Treatment Session  progressing slowly with mobility.  Continue to recommend SNF-pt planning to d/c today.     Follow Up Recommendations  SNF;Supervision/Assistance - 24 hour     Does the patient have the potential to tolerate intense rehabilitation     Barriers to Discharge        Equipment Recommendations  None recommended by PT    Recommendations for Other Services OT consult  Frequency Min 5X/week   Plan Discharge plan remains appropriate    Precautions / Restrictions Precautions Precautions: Posterior Hip;Fall Precaution Comments: Reviewed hip precautions/PWB status. Pt only able to recall 1/3 precautions.  Restrictions Weight Bearing Restrictions: Yes RLE Weight Bearing: Partial weight bearing RLE Partial Weight Bearing Percentage or Pounds: 50%   Pertinent Vitals/Pain At rest, pt denied R hip pain-c/o L hip pain instead. During activity, R hip 5/10    Mobility  Bed Mobility Bed Mobility: Supine to Sit;Sit to Supine Supine to Sit: 1: +2 Total assist Supine to Sit: Patient Percentage: 50% Sit to Supine: 1: +2 Total assist Sit to Supine: Patient Percentage: 40% Details for Bed Mobility Assistance: VCs safety, technique, hand placement. Assist for trunk upright/supine, bil LEs onto/off bed, to maintain safe positioning of R LE.  Transfers Transfers: Sit to Stand;Stand to Sit Sit to Stand: From bed;With upper extremity assist;3: Mod assist Stand to Sit: To bed;With upper extremity assist;3: Mod assist Details for Transfer Assistance: VCs safety, technique, hand placement, R LE positioning. Assist to rise, stabilize, control descent, slide R foot forward before sitting.  Ambulation/Gait Ambulation/Gait Assistance: 3: Mod assist Ambulation Distance  (Feet): 45 Feet Assistive device: Rolling walker Ambulation/Gait Assistance Details: VCS safety, technique, sequence. Assist to stabilize throughout ambulation and maneuver with RW intermittently.  Gait Pattern: Step-to pattern;Trunk flexed;Decreased stride length;Decreased step length - right    Exercises     PT Diagnosis:    PT Problem List:   PT Treatment Interventions:     PT Goals Acute Rehab PT Goals Pt will go Supine/Side to Sit: with min assist PT Goal: Supine/Side to Sit - Progress: Progressing toward goal Pt will go Sit to Supine/Side: with min assist PT Goal: Sit to Supine/Side - Progress: Progressing toward goal Pt will go Sit to Stand: with min assist PT Goal: Sit to Stand - Progress: Progressing toward goal Pt will Ambulate: 51 - 150 feet;with min assist;with rolling walker PT Goal: Ambulate - Progress: Progressing toward goal  Visit Information  Last PT Received On: 11/01/12 Assistance Needed: +2    Subjective Data  Subjective: I guess Im doing okay Patient Stated Goal: Rehab for a week then home   Cognition  Cognition Overall Cognitive Status: Appears within functional limits for tasks assessed/performed Arousal/Alertness: Awake/alert Orientation Level: Appears intact for tasks assessed Behavior During Session: [facility] for tasks performed    Balance     End of Session PT - End of Session Activity Tolerance: Patient tolerated treatment well Patient left: in bed;with call bell/phone within reach;with family/visitor present   GP     Rebeca Alert, MPT Pager: [phone]

## 2012-11-01 NOTE — Progress Notes (Signed)
[name], male   DOB: 1925/07/26, 79 y.o.   MRN: [id number] Subjective: 3 Days Post-Op Procedure(s) (LRB): CONVERSION OF PREVIOUS HIP SURGERY TO RIGHT TOTAL HIP AND REMOVAL OF IM NAIL  (Right)    Patient reports pain as mild.  Patient and family report confusion but this morning he is oriented, seems awake and alert. Reports some left hip pain this am Up with therapy with progression  Objective:   VITALS:   Filed Vitals:   11/01/12 0511  BP: 134/67  Pulse: 97  Temp: 98.4 F (36.9 C)  Resp: 16    Neurovascular intact Incision: dressing C/D/I right hip and distal wounds Left hip some pain with palpation but not as much with motion  LABS  Recent Labs  10/29/12 1020 10/30/12 0446 10/31/12 0438  HGB 11.8* 9.2* 8.7*  HCT 35.8* 27.6* 25.7*  WBC 5.4 6.9 7.9  PLT 181 167 153     Recent Labs  10/30/12 0446 10/31/12 0438 11/01/12 0429  NA 139 137 137  K 4.4 4.0 4.2  BUN 21 21 19   CREATININE 1.38* 1.43* 1.41*  GLUCOSE 95 132* 103*     Recent Labs  10/29/12 1020  INR 1.18     Assessment/Plan: 3 Days Post-Op Procedure(s) (LRB): CONVERSION OF PREVIOUS HIP SURGERY TO RIGHT TOTAL HIP AND REMOVAL OF IM NAIL  (Right)   Up with therapy Discharge to SNF today if he remains clear in his mind and does well with therapy To go to [facility] RTC 2 weeks

## 2012-11-13 ENCOUNTER — Inpatient Hospital Stay (HOSPITAL_COMMUNITY)
Admission: EM | Admit: 2012-11-13 | Discharge: 2012-11-19 | DRG: 070 | Disposition: A | Discharge status: Home / Self Care | Payer: Medicare Other | Attending: Internal Medicine | Admitting: Internal Medicine

## 2012-11-13 ENCOUNTER — Encounter (HOSPITAL_COMMUNITY): Payer: Self-pay | Admitting: *Deleted

## 2012-11-13 DIAGNOSIS — D649 Anemia, unspecified: Secondary | ICD-10-CM | POA: Diagnosis present

## 2012-11-13 DIAGNOSIS — K921 Melena: Secondary | ICD-10-CM | POA: Diagnosis present

## 2012-11-13 DIAGNOSIS — N401 Enlarged prostate with lower urinary tract symptoms: Secondary | ICD-10-CM | POA: Diagnosis present

## 2012-11-13 DIAGNOSIS — N008 Acute nephritic syndrome with other morphologic changes: Secondary | ICD-10-CM | POA: Diagnosis present

## 2012-11-13 DIAGNOSIS — Q391 Atresia of esophagus with tracheo-esophageal fistula: Secondary | ICD-10-CM

## 2012-11-13 DIAGNOSIS — N189 Chronic kidney disease, unspecified: Secondary | ICD-10-CM | POA: Diagnosis present

## 2012-11-13 DIAGNOSIS — D62 Acute posthemorrhagic anemia: Secondary | ICD-10-CM

## 2012-11-13 DIAGNOSIS — I472 Ventricular tachycardia, unspecified: Secondary | ICD-10-CM | POA: Diagnosis present

## 2012-11-13 DIAGNOSIS — N400 Enlarged prostate without lower urinary tract symptoms: Secondary | ICD-10-CM | POA: Diagnosis present

## 2012-11-13 DIAGNOSIS — K922 Gastrointestinal hemorrhage, unspecified: Secondary | ICD-10-CM

## 2012-11-13 DIAGNOSIS — I4729 Other ventricular tachycardia: Secondary | ICD-10-CM | POA: Diagnosis present

## 2012-11-13 DIAGNOSIS — Z79899 Other long term (current) drug therapy: Secondary | ICD-10-CM

## 2012-11-13 DIAGNOSIS — D72829 Elevated white blood cell count, unspecified: Secondary | ICD-10-CM | POA: Diagnosis present

## 2012-11-13 DIAGNOSIS — Z96649 Presence of unspecified artificial hip joint: Secondary | ICD-10-CM

## 2012-11-13 DIAGNOSIS — G9349 Other encephalopathy: Principal | ICD-10-CM | POA: Diagnosis present

## 2012-11-13 DIAGNOSIS — D689 Coagulation defect, unspecified: Secondary | ICD-10-CM

## 2012-11-13 DIAGNOSIS — N138 Other obstructive and reflux uropathy: Secondary | ICD-10-CM | POA: Diagnosis present

## 2012-11-13 DIAGNOSIS — K209 Esophagitis, unspecified without bleeding: Secondary | ICD-10-CM | POA: Diagnosis present

## 2012-11-13 DIAGNOSIS — N1330 Unspecified hydronephrosis: Secondary | ICD-10-CM | POA: Diagnosis present

## 2012-11-13 DIAGNOSIS — K449 Diaphragmatic hernia without obstruction or gangrene: Secondary | ICD-10-CM | POA: Diagnosis present

## 2012-11-13 DIAGNOSIS — M109 Gout, unspecified: Secondary | ICD-10-CM | POA: Diagnosis present

## 2012-11-13 DIAGNOSIS — Q393 Congenital stenosis and stricture of esophagus: Secondary | ICD-10-CM

## 2012-11-13 DIAGNOSIS — I10 Essential (primary) hypertension: Secondary | ICD-10-CM

## 2012-11-13 DIAGNOSIS — E871 Hypo-osmolality and hyponatremia: Secondary | ICD-10-CM | POA: Diagnosis present

## 2012-11-13 DIAGNOSIS — E875 Hyperkalemia: Secondary | ICD-10-CM | POA: Diagnosis present

## 2012-11-13 DIAGNOSIS — E872 Acidosis, unspecified: Secondary | ICD-10-CM | POA: Diagnosis present

## 2012-11-13 DIAGNOSIS — E876 Hypokalemia: Secondary | ICD-10-CM

## 2012-11-13 DIAGNOSIS — G9341 Metabolic encephalopathy: Secondary | ICD-10-CM | POA: Diagnosis present

## 2012-11-13 DIAGNOSIS — I129 Hypertensive chronic kidney disease with stage 1 through stage 4 chronic kidney disease, or unspecified chronic kidney disease: Secondary | ICD-10-CM | POA: Diagnosis present

## 2012-11-13 DIAGNOSIS — G4700 Insomnia, unspecified: Secondary | ICD-10-CM | POA: Diagnosis present

## 2012-11-13 DIAGNOSIS — N179 Acute kidney failure, unspecified: Secondary | ICD-10-CM | POA: Diagnosis present

## 2012-11-13 DIAGNOSIS — R571 Hypovolemic shock: Secondary | ICD-10-CM | POA: Diagnosis present

## 2012-11-13 DIAGNOSIS — N259 Disorder resulting from impaired renal tubular function, unspecified: Secondary | ICD-10-CM | POA: Diagnosis present

## 2012-11-13 DIAGNOSIS — A419 Sepsis, unspecified organism: Secondary | ICD-10-CM | POA: Diagnosis present

## 2012-11-13 DIAGNOSIS — N139 Obstructive and reflux uropathy, unspecified: Secondary | ICD-10-CM | POA: Diagnosis present

## 2012-11-13 LAB — POCT I-STAT, CHEM 8
BUN: 140 mg/dL — ABNORMAL HIGH (ref 6–23)
Chloride: 102 mEq/L (ref 96–112)
Creatinine, Ser: 8.6 mg/dL — ABNORMAL HIGH (ref 0.50–1.35)
Glucose, Bld: 96 mg/dL (ref 70–99)
HCT: 21 % — ABNORMAL LOW (ref 39.0–52.0)
Potassium: 6.3 mEq/L (ref 3.5–5.1)

## 2012-11-13 LAB — CBC WITH DIFFERENTIAL/PLATELET
Basophils Absolute: 0 10*3/uL (ref 0.0–0.1)
Eosinophils Relative: 0 % (ref 0–5)
HCT: 22.6 % — ABNORMAL LOW (ref 39.0–52.0)
Lymphocytes Relative: 6 % — ABNORMAL LOW (ref 12–46)
Lymphs Abs: 0.9 10*3/uL (ref 0.7–4.0)
MCV: 85 fL (ref 78.0–100.0)
Monocytes Absolute: 1.1 10*3/uL — ABNORMAL HIGH (ref 0.1–1.0)
Neutro Abs: 12.6 10*3/uL — ABNORMAL HIGH (ref 1.7–7.7)
Platelets: 332 10*3/uL (ref 150–400)
RBC: 2.66 MIL/uL — ABNORMAL LOW (ref 4.22–5.81)
RDW: 14.4 % (ref 11.5–15.5)
WBC: 14.7 10*3/uL — ABNORMAL HIGH (ref 4.0–10.5)

## 2012-11-13 LAB — BASIC METABOLIC PANEL
CO2: 15 mEq/L — ABNORMAL LOW (ref 19–32)
Chloride: 90 mEq/L — ABNORMAL LOW (ref 96–112)
Glucose, Bld: 102 mg/dL — ABNORMAL HIGH (ref 70–99)
Sodium: 125 mEq/L — ABNORMAL LOW (ref 135–145)

## 2012-11-13 LAB — PROTIME-INR
INR: 2.48 — ABNORMAL HIGH (ref 0.00–1.49)
Prothrombin Time: 25.7 seconds — ABNORMAL HIGH (ref 11.6–15.2)

## 2012-11-13 LAB — URINE MICROSCOPIC-ADD ON

## 2012-11-13 LAB — URINALYSIS, ROUTINE W REFLEX MICROSCOPIC
Glucose, UA: NEGATIVE mg/dL
pH: 6 (ref 5.0–8.0)

## 2012-11-13 LAB — APTT: aPTT: 73 seconds — ABNORMAL HIGH (ref 24–37)

## 2012-11-13 LAB — OCCULT BLOOD, POC DEVICE: Fecal Occult Bld: POSITIVE — AB

## 2012-11-13 MED ORDER — ALBUTEROL (5 MG/ML) CONTINUOUS INHALATION SOLN
INHALATION_SOLUTION | RESPIRATORY_TRACT | Status: AC
  Filled 2012-11-13: qty 20

## 2012-11-13 MED ORDER — INSULIN ASPART 100 UNIT/ML ~~LOC~~ SOLN
10.0000 [IU] | Freq: Once | SUBCUTANEOUS | Status: DC
  Filled 2012-11-13: qty 1

## 2012-11-13 MED ORDER — SODIUM CHLORIDE 0.9 % IV SOLN: INTRAVENOUS | Status: DC

## 2012-11-13 MED ORDER — ALBUTEROL SULFATE (5 MG/ML) 0.5% IN NEBU
10.0000 mg | INHALATION_SOLUTION | Freq: Once | RESPIRATORY_TRACT | Status: AC
  Administered 2012-11-13: 10 mg via RESPIRATORY_TRACT

## 2012-11-13 MED ORDER — INSULIN ASPART 100 UNIT/ML IV SOLN: 10.0000 [IU] | Freq: Once | INTRAVENOUS | Status: DC

## 2012-11-13 MED ORDER — SODIUM CHLORIDE 0.9 % IV BOLUS (SEPSIS): 500.0000 mL | Freq: Once | INTRAVENOUS | Status: DC

## 2012-11-13 MED ORDER — INSULIN ASPART 100 UNIT/ML ~~LOC~~ SOLN
10.0000 [IU] | Freq: Once | SUBCUTANEOUS | Status: AC
  Administered 2012-11-13: 10 [IU] via INTRAVENOUS

## 2012-11-13 MED ORDER — DEXTROSE 50 % IV SOLN
1.0000 | Freq: Once | INTRAVENOUS | Status: AC
  Administered 2012-11-13: 50 mL via INTRAVENOUS
  Filled 2012-11-13: qty 50

## 2012-11-13 MED ORDER — SODIUM CHLORIDE 0.9 % IV BOLUS (SEPSIS)
500.0000 mL | Freq: Once | INTRAVENOUS | Status: AC
  Administered 2012-11-13: 500 mL via INTRAVENOUS

## 2012-11-13 NOTE — ED Notes (Signed)
XBJ:[id number]<NF> Expected date:11/13/12<BR> Expected time: 8:29 PM<BR> Means of arrival:Ambulance<BR> Comments:<BR> UTI, abnormal labs

## 2012-11-13 NOTE — ED Notes (Signed)
Pt was dx with uti about 2 days ago. Pt had hip right surgery 2 weeks ago. Pt was started on rocephin this am per [facility]. Pt also started on septra 2 days ago for uti. Medical director oreder labs. Pt with abn labs per [facility]. Medical director instructed to send to Ed. Pt with K of 6.7 hgb of 8.3 and bun 116 and cr of 7.77. Pt has hx of chronic renal disease.

## 2012-11-13 NOTE — ED Provider Notes (Signed)
History    CSN: [id number] Arrival date & time 11/13/12  2045 First MD Initiated Contact with Patient 11/13/12 2056     Chief Complaint  Patient presents with  . Abnormal Lab   The history is provided by the patient and the nursing home.   patient presents to the emergency room for evaluation of abnormal laboratory values. The patient was recently in the hospital for hip surgery about 2 weeks ago. The patient had laboratory tests drawn today. According to records sent over from the nursing facility his potassium level was elevated at 6.7. His hemoglobin was 8.3. His BUN was 116 and his creatinine was now 7.77.  The patient was sent to the emergency room for further evaluation.  The patient himself states he feels fine. He denies any vomiting or diarrhea. He denies any pain. He denies any fever or difficulty with his appetite. Patient denies feeling any increased weakness.  Past Medical History  Diagnosis Date  . Gout   . Insomnia   . BPH (benign prostatic hyperplasia)   . Kidney disorder   . Complication of anesthesia     hard to wake up -2011 after hernia surgery   . Arthritis   . Hypertension     patient denies on 10/25/12 on no meds     Past Surgical History  Procedure Laterality Date  . Cataract extraction    . Cholecystectomy    . Hernia repair  02/22/11    right  . Joint replacement      fractured hip  . Total hip arthroplasty Right 10/29/2012    Procedure: CONVERSION OF PREVIOUS HIP SURGERY TO RIGHT TOTAL HIP AND REMOVAL OF IM NAIL ;  Surgeon: Shelda Pal, MD;  Location: [facility];  Service: Orthopedics;  Laterality: Right;    Family History  Problem Relation Age of Onset  . Heart failure Mother   . Heart failure Father   . Diabetes Neg Hx     History  Substance Use Topics  . Smoking status: Never Smoker   . Smokeless tobacco: Never Used  . Alcohol Use: No      Review of Systems  All other systems reviewed and are negative.    Allergies  Nsaids  Home  Medications   Current Outpatient Rx  Name  Route  Sig  Dispense  Refill  . allopurinol (ZYLOPRIM) 100 MG tablet   Oral   Take 100 mg by mouth every morning.         . cefTRIAXone (ROCEPHIN) 1 G injection   Intramuscular   Inject 1 g into the muscle once.         . docusate sodium 100 MG CAPS   Oral   Take 100 mg by mouth 2 (two) times daily.   10 capsule      . ferrous sulfate 325 (65 FE) MG tablet   Oral   Take 325 mg by mouth every morning.         . meclizine (ANTIVERT) 12.5 MG tablet   Oral   Take 12.5 mg by mouth every 4 (four) hours as needed (nausea and vomiting).         . polyethylene glycol (MIRALAX / GLYCOLAX) packet   Oral   Take 17 g by mouth 2 (two) times daily.   14 each      . rivaroxaban (XARELTO) 10 MG TABS tablet   Oral   Take 10 mg by mouth every morning.         Marland Kitchen  sulfamethoxazole-trimethoprim (BACTRIM DS,SEPTRA DS) 800-160 MG per tablet   Oral   Take 1 tablet by mouth 2 (two) times daily.         . tamsulosin (FLOMAX) 0.4 MG CAPS   Oral   Take 0.4 mg by mouth daily after supper.         . traMADol (ULTRAM) 50 MG tablet   Oral   Take 50 mg by mouth every 4 (four) hours as needed (pain).           BP 135/57  Pulse 103  Temp(Src) 98.5 F (36.9 C) (Oral)  Resp 16  SpO2 97%  Physical Exam  Nursing note and vitals reviewed. Constitutional: No distress.  frail  HENT:  Head: Normocephalic and atraumatic.  Right Ear: External ear normal.  Left Ear: External ear normal.  Mouth/Throat: No oropharyngeal exudate.  Mm dry   Eyes: Conjunctivae are normal. Right eye exhibits no discharge. Left eye exhibits no discharge. No scleral icterus.  Neck: Neck supple. No tracheal deviation present.  Cardiovascular: Normal rate, regular rhythm and intact distal pulses.   Pulmonary/Chest: Effort normal and breath sounds normal. No stridor. No respiratory distress. He has no wheezes. He has no rales.  Abdominal: Soft. Bowel sounds are  normal. He exhibits no distension. There is no tenderness. There is no rebound and no guarding.  Genitourinary:  Black melanotic stool on rectal exam  Musculoskeletal: He exhibits edema. He exhibits no tenderness.  Edema bilateral lower extremities, surgical wound right hip without erythema or drainage  Neurological: He is alert. He has normal strength. No sensory deficit. Cranial nerve deficit:  no gross defecits noted. He exhibits normal muscle tone. He displays no seizure activity. Coordination normal.  Skin: Skin is warm and dry. No rash noted.  Thin, frail skin, multiple areas of ecchymoses  Psychiatric: He has a normal mood and affect.    ED Course  Procedures (including critical care time)  Rate: 92  Rhythm: normal sinus rhythm  QRS Axis: normal  Intervals: normal  ST/T Wave abnormalities: normal  Conduction Disutrbances:none  Narrative Interpretation: Low voltage in frontal leads, early precordial transition  Old EKG Reviewed: No significant change when compared to prior EKG  Medications  sodium chloride 0.9 % bolus 500 mL (not administered)  albuterol (PROVENTIL) (5 MG/ML) 0.5% nebulizer solution 10 mg (not administered)  dextrose 50 % solution 50 mL (not administered)  insulin aspart (novoLOG) injection 10 Units (not administered)  sodium chloride 0.9 % bolus 500 mL (500 mLs Intravenous New Bag/Given 11/13/12 2141)   CRITICAL CARE Performed by: Linwood Dibbles R Total critical care time:35 Critical care time was exclusive of separately billable procedures and treating other patients. Critical care was necessary to treat or prevent imminent or life-threatening deterioration. Critical care was time spent personally by me on the following activities: development of treatment plan with patient and/or surrogate as well as nursing, discussions with consultants, evaluation of patient's response to treatment, examination of patient, obtaining history from patient or surrogate, ordering  and performing treatments and interventions, ordering and review of laboratory studies, ordering and review of radiographic studies, pulse oximetry and re-evaluation of patient's condition.  Labs Reviewed  CBC WITH DIFFERENTIAL - Abnormal; Notable for the following:    WBC 14.7 (*)    RBC 2.66 (*)    Hemoglobin 7.7 (*)    HCT 22.6 (*)    Neutrophils Relative 86 (*)    Neutro Abs 12.6 (*)    Lymphocytes Relative 6 (*)  Monocytes Absolute 1.1 (*)    All other components within normal limits  BASIC METABOLIC PANEL - Abnormal; Notable for the following:    Sodium 125 (*)    Potassium 6.5 (*)    Chloride 90 (*)    CO2 15 (*)    Glucose, Bld 102 (*)    BUN 119 (*)    Creatinine, Ser 8.41 (*)    GFR calc non Af Amer 5 (*)    GFR calc Af Amer 6 (*)    All other components within normal limits  APTT - Abnormal; Notable for the following:    aPTT 73 (*)    All other components within normal limits  PROTIME-INR - Abnormal; Notable for the following:    Prothrombin Time 25.7 (*)    INR 2.48 (*)    All other components within normal limits  OCCULT BLOOD, POC DEVICE - Abnormal; Notable for the following:    Fecal Occult Bld POSITIVE (*)    All other components within normal limits  URINALYSIS, ROUTINE W REFLEX MICROSCOPIC  OCCULT BLOOD X 1 CARD TO LAB, STOOL  TYPE AND SCREEN  PREPARE RBC (CROSSMATCH)   No results found.   1. GI bleeding   2. Coagulopathy       MDM  Acute renal failure Patient presents to the emergency department status post his recent hospitalization with acute renal failure. Etiology this may be multifactorial. Currently I will treat with IV fluids.  Trea this potassium with insulin, glucose, albuterol neb.  Continue hydration and monitor.  EKG without signs of peaked t waves or qrs widening  GI bleed while on pradaxa Unable to fully assess whether he is having active bleeding but he is not vomiting blood.  Blood transfusions ordered.   Do not feel that  dialysis or factor eight inhibitor is necessary at this time but will need to monitor closely.  Pt to be monitored to hospital for further treatment.        Celene Kras, MD 11/13/12 2226

## 2012-11-13 NOTE — H&P (Signed)
[facility] History and Physical  [name] KGM:[id number] DOB: 1925/04/19 DOA: 11/13/2012  Referring physician: Dr. Lynelle Doctor PCP: Verneita Griffes, MD  Specialists:   Chief Complaint: Confusion, abnormal labs per nursing facility  HPI: [name] is a 79 y.o. male with history of recent UTI and nursing facility started on Bactrim following an initial dose of Rocephin, renal insufficiency-last creatinine on 3/13 1.4, BPH hypertension recently discharged from hospital on 3/13 following revision of right hip surgery per Dr. Charlann Boxer who presents with above complaints. The history is obtained from EDP in chart reviewed. Patient states that he was being treated for a UTI and reports that he's had decreased by mouth intake and that has been for a while. Per nursing facility records he had labs done today and potassium came back elevated at 6.7 and his creatinine was 7.7 with a BUN of 116. And so she was sent to the ED. In the ED labs revealed a white cell count of 14.7 with a hemoglobin of 7.7>> 7.1 on recheck, and per EDP rectal exam is guaiac-positive and showed melanotic stool. His chemistries showed a sodium of 125 with potassium of 6.5 BUN of 119 >>140-on recheck  her creatinine was 8.4>> 8.6 on recheck.  He was given insulin with D50, as well as albuterol for his potassium,Transfusion of packed red blood cells ordered per EDP , Foley placement was ordered but he refused. Per nsg staff  voiding without difficulty in ED. He is admitted for further evaluation and management. He denies cough, shortness of breath, chest pain and no hematochezia.   Review of Systems: The patient denies, weight loss,, vision loss, decreased hearing, hoarseness, chest pain, syncope, dyspnea on exertion, peripheral edema, balance deficits, hemoptysis, abdominal pain, severe indigestion/heartburn,  transient blindness, difficulty walking, depression, unusual weight change.   Past Medical History  Diagnosis Date   . Gout   . Insomnia   . BPH (benign prostatic hyperplasia)   . Kidney disorder   . Complication of anesthesia     hard to wake up -2011 after hernia surgery   . Arthritis   . Hypertension     patient denies on 10/25/12 on no meds    Past Surgical History  Procedure Laterality Date  . Cataract extraction    . Cholecystectomy    . Hernia repair  02/22/11    right  . Joint replacement      fractured hip  . Total hip arthroplasty Right 10/29/2012    Procedure: CONVERSION OF PREVIOUS HIP SURGERY TO RIGHT TOTAL HIP AND REMOVAL OF IM NAIL ;  Surgeon: Shelda Pal, MD;  Location: [facility];  Service: Orthopedics;  Laterality: Right;   Social History:  reports that he has never smoked. He has never used smokeless tobacco. He reports that he does not drink alcohol or use illicit drugs. where does patient live--SNF   Allergies  Allergen Reactions  . Nsaids     REACTION: hx of renal failure    Family History  Problem Relation Age of Onset  . Heart failure Mother   . Heart failure Father   . Diabetes Neg Hx     Prior to Admission medications   Medication Sig Start Date End Date Taking? Authorizing Provider  allopurinol (ZYLOPRIM) 100 MG tablet Take 100 mg by mouth every morning. 09/07/12  Yes Gordy Savers, MD  cefTRIAXone (ROCEPHIN) 1 G injection Inject 1 g into the muscle once.   Yes Historical Provider, MD  docusate sodium 100  MG CAPS Take 100 mg by mouth 2 (two) times daily. 11/01/12  Yes Genelle Gather Babish, PA-C  ferrous sulfate 325 (65 FE) MG tablet Take 325 mg by mouth every morning. 11/01/12  Yes Genelle Gather Babish, PA-C  meclizine (ANTIVERT) 12.5 MG tablet Take 12.5 mg by mouth every 4 (four) hours as needed (nausea and vomiting).   Yes Historical Provider, MD  polyethylene glycol (MIRALAX / GLYCOLAX) packet Take 17 g by mouth 2 (two) times daily. 11/01/12  Yes Genelle Gather Babish, PA-C  rivaroxaban (XARELTO) 10 MG TABS tablet Take 10 mg by mouth every morning.   Yes  Historical Provider, MD  sulfamethoxazole-trimethoprim (BACTRIM DS,SEPTRA DS) 800-160 MG per tablet Take 1 tablet by mouth 2 (two) times daily. 11/11/12 11/15/12 Yes Historical Provider, MD  tamsulosin (FLOMAX) 0.4 MG CAPS Take 0.4 mg by mouth daily after supper. 09/07/12  Yes Gordy Savers, MD  traMADol (ULTRAM) 50 MG tablet Take 50 mg by mouth every 4 (four) hours as needed (pain).   Yes Historical Provider, MD   Physical Exam: Filed Vitals:   11/13/12 2053 11/13/12 2228 11/13/12 2230 11/13/12 2300  BP: 135/57  134/55 144/56  Pulse: 103  104   Temp: 98.5 F (36.9 C)     TempSrc: Oral     Resp: 16  21 25   SpO2: 97% 100% 100%     Constitutional: Vital signs reviewed.  Patient is a well-developed elderly male, in no acute distress and cooperative with exam. Alert, disoriented Head: Normocephalic and atraumatic Mouth: no erythema or exudates, dry MM Eyes: PERRL, EOMI, conjunctivae normal, No scleral icterus.  Neck: Supple, Trachea midline normal ROM, No JVD, mass, thyromegaly, or carotid bruit present.  Cardiovascular: RRR, S1 normal, S2 normal, no MRG, pulses symmetric and intact bilaterally Pulmonary/Chest: CTAB, no wheezes, rales, or rhonchi Abdominal: Soft. Non-tender, non-distended, bowel sounds are normal, no masses, organomegaly, or guarding present.  GU: no CVA tenderness  Extremities: +1-2 edema, no cyanosis  Neurological: Alert, disoriented, moves all extremities grossly, cranial nerve II-XII are grossly intact, no focal motor deficit, sensory intact to light touch bilaterally.  Skin: Warm, dry and intact. No rash, cyanosis, or clubbing.  Psychiatric: Normal mood and affect.   Labs on Admission:  Basic Metabolic Panel:  Recent Labs Lab 11/13/12 2129 11/13/12 2217  NA 125* 126*  K 6.5* 6.3*  CL 90* 102  CO2 15*  --   GLUCOSE 102* 96  BUN 119* 140*  CREATININE 8.41* 8.60*  CALCIUM 8.6  --    Liver Function Tests: No results found for this basename: AST, ALT,  ALKPHOS, BILITOT, PROT, ALBUMIN,  in the last 168 hours No results found for this basename: LIPASE, AMYLASE,  in the last 168 hours No results found for this basename: AMMONIA,  in the last 168 hours CBC:  Recent Labs Lab 11/13/12 2129 11/13/12 2217  WBC 14.7*  --   NEUTROABS 12.6*  --   HGB 7.7* 7.1*  HCT 22.6* 21.0*  MCV 85.0  --   PLT 332  --    Cardiac Enzymes: No results found for this basename: CKTOTAL, CKMB, CKMBINDEX, TROPONINI,  in the last 168 hours  BNP (last 3 results) No results found for this basename: PROBNP,  in the last 8760 hours CBG: No results found for this basename: GLUCAP,  in the last 168 hours  Radiological Exams on Admission: No results found.  EKG: Independently reviewed. Normal sinus rhythm at 92, no peaked T waves, no QRS widening  Assessment/Plan Active Problems: Melena/GI bleed -As discussed above, in patient presenting with melena and elevated INR on per Pradaxa -Being transfused PRBC as discussed above -Will hold Pradaxa -Cycle H&H and transfuse as needed -Consulted GI-patient discussed with Dr. Elnoria Howard and he states he will see him tomorrow for  endoscopy when medically stable Anemia, blood loss As discussed above. UTI, RECENT -As discussed above he had been started on antibiotics at nursing facility for UTI -Recheck UA with cultures -Empiric antibiotics with Rocephin and follow Hyponatremia -Likely secondary to volume depletion, hydrate follow and recheck  Acute renal failure -Likely multifactorial secondary to #1/volume depletion, Bactrim, and UTI -Obtain renal ultrasound, volume resuscitation -Follow and recheck a.m., if not improving consult renal for further recommendations    Hyperkalemia -Secondary to ARF and Bactrim -Given insulin D50 albuterol in ED, will also give Kayexalate -Follow recheck and if refractory to above will recommend a renal consultation for possible dialysis   Leukocytosis  -Likely secondary to UTI, also  obtain chest x-ray and follow. Acute encephalopathy -Multifactorial patient with - UTI,markedly elevated BUN ,Hyponatremia -As discussed above follow. Coagulopathy -DC Pradaxa as above, follow recheck INR and further treat accordingly   HYPERTENSION -Monitor and further treat accordingly   BENIGN PROSTATIC HYPERTROPHY -Continue Flomax   S/P recent right TH revision -Follow, recommend notify Dr. Charlann Boxer in a.m.        Code Status:full Family Communication: no alone at bedside Disposition Plan: admit to step down  Time spent: Critical care time  [name] [facility] Pager [phone]  If 7PM-7AM, please contact night-coverage [email or web address] Password [facility] 11/13/2012, 11:57 PM

## 2012-11-14 ENCOUNTER — Inpatient Hospital Stay (HOSPITAL_COMMUNITY): Payer: Medicare Other

## 2012-11-14 DIAGNOSIS — D62 Acute posthemorrhagic anemia: Secondary | ICD-10-CM

## 2012-11-14 DIAGNOSIS — N179 Acute kidney failure, unspecified: Secondary | ICD-10-CM | POA: Diagnosis present

## 2012-11-14 DIAGNOSIS — K922 Gastrointestinal hemorrhage, unspecified: Secondary | ICD-10-CM

## 2012-11-14 LAB — BASIC METABOLIC PANEL
BUN: 104 mg/dL — ABNORMAL HIGH (ref 6–23)
BUN: 86 mg/dL — ABNORMAL HIGH (ref 6–23)
CO2: 17 mEq/L — ABNORMAL LOW (ref 19–32)
CO2: 17 mEq/L — ABNORMAL LOW (ref 19–32)
Chloride: 104 mEq/L (ref 96–112)
GFR calc non Af Amer: 7 mL/min — ABNORMAL LOW (ref >90)
Glucose, Bld: 91 mg/dL (ref 70–99)
Glucose, Bld: 95 mg/dL (ref 70–99)
Potassium: 5.2 mEq/L — ABNORMAL HIGH (ref 3.5–5.1)
Potassium: 3.9 mEq/L (ref 3.5–5.1)
Sodium: 136 mEq/L (ref 135–145)

## 2012-11-14 LAB — HEPATIC FUNCTION PANEL
ALT: 61 U/L — ABNORMAL HIGH (ref 0–53)
AST: 98 U/L — ABNORMAL HIGH (ref 0–37)
Albumin: 2.2 g/dL — ABNORMAL LOW (ref 3.5–5.2)
Alkaline Phosphatase: 172 U/L — ABNORMAL HIGH (ref 39–117)
Total Bilirubin: 1.1 mg/dL (ref 0.3–1.2)

## 2012-11-14 LAB — URINE MICROSCOPIC-ADD ON

## 2012-11-14 LAB — CBC
HCT: 26.8 % — ABNORMAL LOW (ref 39.0–52.0)
Hemoglobin: 9.5 g/dL — ABNORMAL LOW (ref 13.0–17.0)
MCH: 29.7 pg (ref 26.0–34.0)
MCHC: 35.4 g/dL (ref 30.0–36.0)
MCV: 83.8 fL (ref 78.0–100.0)

## 2012-11-14 LAB — URINALYSIS, ROUTINE W REFLEX MICROSCOPIC
Bilirubin Urine: NEGATIVE
Glucose, UA: NEGATIVE mg/dL
Ketones, ur: NEGATIVE mg/dL
Protein, ur: 30 mg/dL — AB
Urobilinogen, UA: 0.2 mg/dL (ref 0.0–1.0)

## 2012-11-14 LAB — PRO B NATRIURETIC PEPTIDE: Pro B Natriuretic peptide (BNP): 1488 pg/mL — ABNORMAL HIGH (ref 0–450)

## 2012-11-14 LAB — HEMOGLOBIN AND HEMATOCRIT, BLOOD: HCT: 25 % — ABNORMAL LOW (ref 39.0–52.0)

## 2012-11-14 MED ORDER — TAMSULOSIN HCL 0.4 MG PO CAPS
0.4000 mg | ORAL_CAPSULE | Freq: Every day | ORAL | Status: DC
  Administered 2012-11-14 – 2012-11-18 (×5): 0.4 mg via ORAL
  Filled 2012-11-14 (×7): qty 1

## 2012-11-14 MED ORDER — SODIUM CHLORIDE 0.9 % IV SOLN
8.0000 mg/h | INTRAVENOUS | Status: DC
  Administered 2012-11-14 – 2012-11-16 (×4): 8 mg/h via INTRAVENOUS
  Filled 2012-11-14 (×12): qty 80

## 2012-11-14 MED ORDER — ONDANSETRON HCL 4 MG/2ML IJ SOLN: 4.0000 mg | Freq: Four times a day (QID) | INTRAMUSCULAR | Status: DC | PRN

## 2012-11-14 MED ORDER — DEXTROSE 5 % IV SOLN
1.0000 g | Freq: Every day | INTRAVENOUS | Status: DC
  Administered 2012-11-14 – 2012-11-16 (×4): 1 g via INTRAVENOUS
  Filled 2012-11-14 (×4): qty 10

## 2012-11-14 MED ORDER — STERILE WATER FOR INJECTION IV SOLN
INTRAVENOUS | Status: DC
  Administered 2012-11-14: 01:00:00 via INTRAVENOUS
  Filled 2012-11-14 (×4): qty 9.7

## 2012-11-14 MED ORDER — SODIUM CHLORIDE 0.9 % IJ SOLN
3.0000 mL | Freq: Two times a day (BID) | INTRAMUSCULAR | Status: DC
  Administered 2012-11-14 – 2012-11-19 (×6): 3 mL via INTRAVENOUS

## 2012-11-14 MED ORDER — SODIUM BICARBONATE 8.4 % IV SOLN
INTRAVENOUS | Status: DC
  Administered 2012-11-14 (×2): via INTRAVENOUS
  Filled 2012-11-14 (×5): qty 1000

## 2012-11-14 MED ORDER — SODIUM CHLORIDE 0.9 % IV BOLUS (SEPSIS)
1000.0000 mL | Freq: Once | INTRAVENOUS | Status: AC
  Administered 2012-11-14: 1000 mL via INTRAVENOUS

## 2012-11-14 MED ORDER — ALLOPURINOL 100 MG PO TABS
100.0000 mg | ORAL_TABLET | Freq: Every morning | ORAL | Status: DC
  Administered 2012-11-14 – 2012-11-19 (×6): 100 mg via ORAL
  Filled 2012-11-14 (×7): qty 1

## 2012-11-14 MED ORDER — DOCUSATE SODIUM 100 MG PO CAPS
100.0000 mg | ORAL_CAPSULE | Freq: Two times a day (BID) | ORAL | Status: DC
  Administered 2012-11-15 – 2012-11-19 (×9): 100 mg via ORAL
  Filled 2012-11-14 (×14): qty 1

## 2012-11-14 MED ORDER — MECLIZINE HCL 12.5 MG PO TABS
12.5000 mg | ORAL_TABLET | ORAL | Status: DC | PRN
  Filled 2012-11-14: qty 1

## 2012-11-14 MED ORDER — SODIUM BICARBONATE 8.4 % IV SOLN: INTRAVENOUS | Status: DC

## 2012-11-14 MED ORDER — ACETAMINOPHEN 325 MG PO TABS
650.0000 mg | ORAL_TABLET | Freq: Four times a day (QID) | ORAL | Status: DC | PRN
  Administered 2012-11-14 – 2012-11-16 (×5): 650 mg via ORAL
  Filled 2012-11-14 (×5): qty 2

## 2012-11-14 MED ORDER — ONDANSETRON HCL 4 MG PO TABS: 4.0000 mg | ORAL_TABLET | Freq: Four times a day (QID) | ORAL | Status: DC | PRN

## 2012-11-14 MED ORDER — SODIUM POLYSTYRENE SULFONATE 15 GM/60ML PO SUSP
45.0000 g | Freq: Once | ORAL | Status: AC
  Administered 2012-11-14: 45 g via ORAL
  Filled 2012-11-14: qty 180

## 2012-11-14 NOTE — Clinical Social Work Psychosocial (Signed)
Clinical Social Work Department BRIEF PSYCHOSOCIAL ASSESSMENT 11/14/2012  Patient:  [name], [name]     Account Number:  [id number]     Admit date:  11/13/2012  Clinical Social Worker:  Jodelle Red  Date/Time:  11/14/2012 10:30 AM  Referred by:  CSW  Date Referred:  11/14/2012 Referred for  Other - See comment   Other Referral:   ADMITTED FROM SNF   Interview type:  Family Other interview type:   CHART REVIEW, DISCUSSION WITH DAUGHTERS AND WIFE    PSYCHOSOCIAL DATA Living Status:  FACILITY Admitted from facility:  [facility] Level of care:  Skilled Nursing Facility Primary support name:  Crewe Heathman Primary support relationship to patient:  SPOUSE Degree of support available:   good from spouse and daughters    CURRENT CONCERNS Current Concerns  Post-Acute Placement  Adjustment to Illness   Other Concerns:   family wants to take Pt home or do a new SNF, they do not want to return to [facility]    SOCIAL WORK ASSESSMENT / PLAN CSW met with daughters and wife. They all feel Pt was not getting good care at SNF and would like to take Pt home if possible after this admission. They agree to SNF at new SNF, if it is needed. They would prefer to take home, however. Family very concerned about Pt and how he is doing. They apprear to be getting updates and needed questions answered. CSW to follow for new SNF and support.   Assessment/plan status:  Psychosocial Support/Ongoing Assessment of Needs Other assessment/ plan:   possible new SNF   Information/referral to community resources:   gave SNF list for possible new SNF.    PATIENT'S/FAMILY'S RESPONSE TO PLAN OF CARE: CSW to follow for support and new SNF search if indicated. Family agrees and Adult nurse.    Doreen Salvage, LCSW ICU/Stepdown Clinical Social Worker [facility] Cell [phone] Hours 8am-1200pm M-F

## 2012-11-14 NOTE — Progress Notes (Signed)
[facility] PROGRESS NOTE  [name]:[id number] DOB: 06-16-1925 DOA: 11/13/2012 PCP: Verneita Griffes, MD   Brief narrative 79 year old male with history of BPH, recent right hip revision surgery, mild CK D. with  creatinine of 1.4 one week back, recent UTI treated with Bactrim was sent from the nursing home for confusion, melanotic stool with drop in H&H, hyperkalemia and acute kidney injury. Patient septic with melanotic stool with significant drop in H&H, hyperkalemia, acute kidney injury and metabolic acidosis.  Assessment/Plan: Septic shock Patient presented with leukocytosis, hypotension and tachycardia with melanotic stool and drop in H&H. Also has  hyperkalemia significant acute kidney injury with elevated BUN and creatinine an anion gap acidosis. -Patient admitted to step down for closer monitoring. Order 2 units PRBC and his H&H have improved this morning. A Foley was placed in draining bloody urine and started on bicarbonate. -Patient appears to be more alert today on exam. Confusion likely secondary to uremia. His blood pressure is soft and still has melanotic stool. Low sodium and elevated potassium have improved in morning labs and renal function slightly better. He has been voiding well. -Underlying etiology for acute on chronic disease could be bladder outlet obstruction with known history of BPH, dehydration versus ATN due to recent use of Bactrim. -Avoid nephrotoxins. -Continue D5 half normal saline and IV nose and doses in between -Monitor serial H&H. -Dr. hung up with a GI consulted and will follow patient -Continue with strict I/O monitoring   Acute on chronic kidney disease As outlined above. Had confusion likely with uremic symptoms on presentation. continue with IV hydration. Monitor urine output and electrolytes. Renal ultrasound suggests bilateral hydronephrosis and very likely for bladder outlet obstruction. Patient has underlying BPH and seen Dr.  Brunilda Payor in the past. I have discussed with Dr Brunilda Payor  and he'll see the patient in the morning. -Continue Flomax -Appreciate renal recommendations. -Recommend for repeat renal ultrasound to evaluate hydronephrosis in 2-3 days  Anemia with GI bleeding Continue to monitor serial H&H. Hemoglobin improved after 2 units PRBC. -Continue IV Protonix -Dr. hung with GI consulted and will evaluate the patient   Hyponatremia Possibly in the setting of dehydration and improved in a.m. labs  Hyperkalemia Secondary to acute kidney injury and possibly recent Bactrim use. Given insulin with D50 and albuterol in the ED. Also given Kayexalate. Potassium level improved in a.m. labs.  Leukocytosis Urine culture sent. Treating empirically with Rocephin.  Coagulopathy Patient on pradaxa for DVT prophylaxis following recent total hip revision. Being held given high INR and melanotic stool.   Code Status: Full code Family Communication: None at bedside Disposition Plan: Currently inpatient. Continue step down monitoring   Consultants:  Renal (Dr. search)    Procedures:  None  Antibiotics:  None  HPI/Subjective: Admission H&P reviewed. Patient denies any specific symptoms . Has received 2 units PRBC this morning. INR improving Renal function slightly improved  Objective: Filed Vitals:   11/14/12 1100 11/14/12 1200 11/14/12 1207 11/14/12 1212  BP: 117/58 98/34 102/38 87/41  Pulse: 106 89 84 92  Temp: 99 F (37.2 C) 98.6 F (37 C) 98.6 F (37 C) 98.6 F (37 C)  TempSrc:      Resp: 20 14 15 17   Height:      Weight:      SpO2: 99% 98% 97% 96%    Intake/Output Summary (Last 24 hours) at 11/14/12 1232 Last data filed at 11/14/12 1100  Gross per 24 hour  Intake   1783 ml  Output   2440 ml  Net   -657 ml   Filed Weights   11/14/12 0000 11/14/12 0500  Weight: 65.9 kg (145 lb 4.5 oz) 69.1 kg (152 lb 5.4 oz)    Exam:   General:  Elderly male lying in bed appears  fatigued  HEENT: No pallor, dry oral mucosa  Cardiovascular: S1 S2 tachycardic, no murmurs rub or gallop  Respiratory: Clear to auscultation bilaterally  Abdomen: Soft, nontender, nondistended, bowel sounds present, Foley draining pinkish urine  Musculoskeletal: Warm, no edema some pain over right hip  CNS: Alert and awake, oriented x2, mild tremors  Data Reviewed: Basic Metabolic Panel:  Recent Labs Lab 11/13/12 2129 11/13/12 2217 11/14/12 0800  NA 125* 126* 134*  K 6.5* 6.3* 5.2*  CL 90* 102 98  CO2 15*  --  17*  GLUCOSE 102* 96 91  BUN 119* 140* 104*  CREATININE 8.41* 8.60* 6.79*  CALCIUM 8.6  --  8.6   Liver Function Tests:  Recent Labs Lab 11/14/12 0800  AST 98*  ALT 61*  ALKPHOS 172*  BILITOT 1.1  PROT 5.6*  ALBUMIN 2.2*   No results found for this basename: LIPASE, AMYLASE,  in the last 168 hours No results found for this basename: AMMONIA,  in the last 168 hours CBC:  Recent Labs Lab 11/13/12 2129 11/13/12 2217 11/14/12 0800  WBC 14.7*  --  13.0*  NEUTROABS 12.6*  --   --   HGB 7.7* 7.1* 9.5*  HCT 22.6* 21.0* 26.8*  MCV 85.0  --  83.8  PLT 332  --  284   Cardiac Enzymes: No results found for this basename: CKTOTAL, CKMB, CKMBINDEX, TROPONINI,  in the last 168 hours BNP (last 3 results)  Recent Labs  11/14/12 0800  PROBNP 1488.0*   CBG: No results found for this basename: GLUCAP,  in the last 168 hours  Recent Results (from the past 240 hour(s))  MRSA PCR SCREENING     Status: None   Collection Time    11/14/12 12:54 AM      Result Value Range Status   MRSA by PCR NEGATIVE  NEGATIVE Final   Comment:            The GeneXpert MRSA Assay (FDA     approved for NASAL specimens     only), is one component of a     comprehensive MRSA colonization     surveillance program. It is not     intended to diagnose MRSA     infection nor to guide or     monitor treatment for     MRSA infections.     Studies: US Renal  11/14/2012   *RADIOLOGY REPORT*  Clinical Data: Acute renal failure  RENAL/URINARY TRACT ULTRASOUND COMPLETE  Comparison:  CT abdomen pelvis dated 11/11/2009  Findings:  Right Kidney:  Poorly visualized.  Measures 10.2 cm.  Mild hydronephrosis.  Left Kidney:  Poorly visualized.  Measures 9.9 cm.  1.7 x 2.3 x 2.2 cm lower pole cyst.  Mild hydronephrosis.  Bladder:  Decompressed by indwelling Foley catheter.  IMPRESSION: Bilateral kidneys are poorly visualized.  Mild bilateral hydronephrosis is suspected.  2.3 cm left lower pole renal cyst.   Original Report Authenticated By: Charline Bills, M.D.    Portable Chest 1 View  11/14/2012  *RADIOLOGY REPORT*  Clinical Data: Bilateral basilar infiltrates.  PORTABLE CHEST - 1 VIEW  Comparison: 10/29/2012 chest radiograph.  Findings: Low lung volumes are present with elevation of the  left hemidiaphragm greater than right.  Increasing atelectasis at the bases.  Superimposed airspace disease may be present, particularly at the left lung base.  Right glenohumeral osteoarthritis is noted. Surgical clips in the right upper quadrant.  Cardiopericardial silhouette appears unchanged allowing for lower volumes of inspiration.  IMPRESSION: Low volume chest.  Left greater than right basilar atelectasis. Superimposed left lower lobe airspace disease cannot be excluded.   Original Report Authenticated By: Andreas Newport, M.D.     Scheduled Meds: . allopurinol  100 mg Oral q morning - 10a  . cefTRIAXone (ROCEPHIN)  IV  1 g Intravenous QHS  . docusate sodium  100 mg Oral BID  . sodium chloride  1,000 mL Intravenous Once  . sodium chloride  3 mL Intravenous Q12H  . tamsulosin  0.4 mg Oral QPC supper   Continuous Infusions: . dextrose 5 % and 0.45% NaCl 1,000 mL with sodium bicarbonate 50 mEq infusion 100 mL/hr at 11/14/12 1008  .  sodium bicarbonate infusion 1/4 NS 1000 mL 100 mL/hr at 11/14/12 0120      Time spent: 35 minutes    Jhoanna Heyde  [facility] Pager  [phone]. If 7PM-7AM, please contact night-coverage at [email or web address], password [email or web address] 11/14/2012, 12:32 PM  LOS: 1 day

## 2012-11-14 NOTE — Progress Notes (Signed)
MD on call notified of low temp of 99.3. No new orders at this time, will continue to monitor.

## 2012-11-14 NOTE — Consult Note (Signed)
[name] 11/14/2012 Leston Schueller D Requesting Physician:  Dr Gonzella Lex  Reason for Consult:  Acute on CKD HPI: The patient is a 79 y.o. year-old with hx of gout, BPH, DJD and CKD IIIa.  Baseline creat is 1.3-1.6 with eGFR 42-54.  Patient had hernia repair in 7/12 and required SNF placement afterwards for debility. He fell in NH and had R hip fracture which required repair.  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  He had surgery on 10/29/12 and was d/c'd to SNF thereafter.  Now is admitted from SNF with AMS and creatinine elevated at 7.7 with BUN of 116. Had recent UTI at SNF being treated with Rocephin then Bactrim. Long hx of BPH. [place] here shows bilat hydronephrosis, mild, but looks moderate.  He is getting fluids, creat down to 6.5 today, +gross bloody urine in foley bag. Pt in confused. Family provided history. Also having melena, on Pradaxa. Also high K+    ROS  not available.   Past Medical History:  Past Medical History  Diagnosis Date  . Gout   . Insomnia   . BPH (benign prostatic hyperplasia)   . Kidney disorder   . Complication of anesthesia     hard to wake up -2011 after hernia surgery   . Arthritis   . Hypertension     patient denies on 10/25/12 on no meds     Past Surgical History:  Past Surgical History  Procedure Laterality Date  . Cataract extraction    . Cholecystectomy    . Hernia repair  02/22/11    right  . Joint replacement      fractured hip  . Total hip arthroplasty Right 10/29/2012    Procedure: CONVERSION OF PREVIOUS HIP SURGERY TO RIGHT TOTAL HIP AND REMOVAL OF IM NAIL ;  Surgeon: Shelda Pal, MD;  Location: [facility];  Service: Orthopedics;  Laterality: Right;    Family History:  Family History  Problem Relation Age of Onset  . Heart failure Mother   . Heart failure Father   . Diabetes Neg Hx    Social History:  reports that he has never smoked. He has never used smokeless tobacco.  He reports that he does not drink alcohol or use illicit drugs.  Allergies:  Allergies  Allergen Reactions  . Nsaids     REACTION: hx of renal failure    Home medications: Prior to Admission medications   Medication Sig Start Date End Date Taking? Authorizing Provider  allopurinol (ZYLOPRIM) 100 MG tablet Take 100 mg by mouth every morning. 09/07/12  Yes Gordy Savers, MD  cefTRIAXone (ROCEPHIN) 1 G injection Inject 1 g into the muscle once.   Yes Historical Provider, MD  docusate sodium 100 MG CAPS Take 100 mg by mouth 2 (two) times daily. 11/01/12  Yes Genelle Gather Babish, PA-C  ferrous sulfate 325 (65 FE) MG tablet Take 325 mg by mouth every morning. 11/01/12  Yes Genelle Gather Babish, PA-C  meclizine (ANTIVERT) 12.5 MG tablet Take 12.5 mg by mouth every 4 (four) hours as needed (nausea and vomiting).   Yes Historical Provider, MD  polyethylene glycol (MIRALAX / GLYCOLAX) packet Take 17 g by mouth 2 (two) times daily. 11/01/12  Yes Genelle Gather Babish, PA-C  rivaroxaban (XARELTO) 10 MG TABS tablet Take 10 mg by mouth every morning.   Yes Historical Provider, MD  sulfamethoxazole-trimethoprim (BACTRIM DS,SEPTRA DS) 800-160 MG per tablet Take 1 tablet  by mouth 2 (two) times daily. 11/11/12 11/15/12 Yes Historical Provider, MD  tamsulosin (FLOMAX) 0.4 MG CAPS Take 0.4 mg by mouth daily after supper. 09/07/12  Yes Gordy Savers, MD  traMADol (ULTRAM) 50 MG tablet Take 50 mg by mouth every 4 (four) hours as needed (pain).   Yes Historical Provider, MD    Labs: Basic Metabolic Panel:  Recent Labs Lab 11/13/12 2129 11/13/12 2217 11/14/12 0800  NA 125* 126* 134*  K 6.5* 6.3* 5.2*  CL 90* 102 98  CO2 15*  --  17*  GLUCOSE 102* 96 91  BUN 119* 140* 104*  CREATININE 8.41* 8.60* 6.79*  CALCIUM 8.6  --  8.6   Liver Function Tests:  Recent Labs Lab 11/14/12 0800  AST 98*  ALT 61*  ALKPHOS 172*  BILITOT 1.1  PROT 5.6*  ALBUMIN 2.2*   No results found for this basename:  LIPASE, AMYLASE,  in the last 168 hours No results found for this basename: AMMONIA,  in the last 168 hours CBC:  Recent Labs Lab 11/13/12 2129 11/13/12 2217 11/14/12 0800  WBC 14.7*  --  13.0*  NEUTROABS 12.6*  --   --   HGB 7.7* 7.1* 9.5*  HCT 22.6* 21.0* 26.8*  MCV 85.0  --  83.8  PLT 332  --  284   PT/INR: @LABRCNTIP (inr:5) Cardiac Enzymes: )No results found for this basename: CKTOTAL, CKMB, CKMBINDEX, TROPONINI,  in the last 168 hours CBG: No results found for this basename: GLUCAP,  in the last 168 hours   Physical Exam:  Blood pressure 107/52, pulse 94, temperature 98.8 F (37.1 C), temperature source Core (Comment), resp. rate 13, height 5\' 5"  (1.651 m), weight 69.1 kg (152 lb 5.4 oz), SpO2 99.00%.  Gen: elderly male, confused, trembling, verbalizes minimally, not orientd Skin: no rash, cyanosis HEENT:  EOMI, sclera anicteric, throat dry Neck: no JVD, no LAN Chest: clear bilaterally CV: regular, no rub or gallop, no carotid or femoral bruits Abdomen: soft, nontender, no mass or HSM Ext: +pedal edema 1+ bilat, no joint effusion or deformity, no gangrene or ulceration Neuro: moves UE's, LE's weak, symmetric weakness, confused  UA- 21-50wbc, TNTC rbc's, rare bact 30 prot  Impression 1. Acute on CKD w +bilat hydronephrosis- prob bladder outlet obstruction from known BPH, possible Bactrim (AIN, Precious Bard), vol depletion. BP soft 2. CKD III1- baseline creat 1.6-1.7 ; due to HTN (was on meds years back, not now), gouty nephropathy, chronic hydro?due to BPH 3. Hx HTN- had been on BP meds in 2012, none now 4. Hx BPH 5. Hx gout 6. Recent R THR 3/110/14 7. Melena / GIB- per primary  Rec- Cont IVF"s, foley. Repeat US in 2-3 days to f/u hydro, needs urology eval (he see Dr Brunilda Payor)   Vinson Moselle  MD [facility] [phone] pgr     [phone] cell 11/14/2012, 10:41 AM

## 2012-11-14 NOTE — Consult Note (Signed)
Unassigned GI  Reason for Consult: Melenic stools/drop in hemoglobin. Referring Physician: [facility]-Dr. Theda Belfast Dhungel.  TOWNES FUHS is an 79 y.o. male.  HPI: 79 year old white male, admitted with melenic stools and multiple medical issues listed below including a UTI for which he recently received Bactrim, BPH, recent hip revision and altered mental status. Found to be hyperkalemic along with a creatinine of 7.7. A Patient is somewhat lethargic and is unable to give me much history. Most of the details are from the hospital chart. Recently started on anticoagulants for DVT prophylaxis.  Past Medical History  Diagnosis Date  . Gout   . Insomnia   . BPH (benign prostatic hyperplasia)   . Kidney disorder   . Complication of anesthesia     hard to wake up -2011 after hernia surgery   . Arthritis   . Hypertension     patient denies on 10/25/12 on no meds    Past Surgical History  Procedure Laterality Date  . Cataract extraction    . Cholecystectomy    . Hernia repair  02/22/11    right  . Joint replacement      fractured hip  . Total hip arthroplasty Right 10/29/2012    Procedure: CONVERSION OF PREVIOUS HIP SURGERY TO RIGHT TOTAL HIP AND REMOVAL OF IM NAIL ;  Surgeon: Shelda Pal, MD;  Location: [facility];  Service: Orthopedics;  Laterality: Right;   Family History  Problem Relation Age of Onset  . Heart failure Mother   . Heart failure Father   . Diabetes Neg Hx    Social History:  reports that he has never smoked. He has never used smokeless tobacco. He reports that he does not drink alcohol or use illicit drugs.  Allergies:  Allergies  Allergen Reactions  . Nsaids     REACTION: hx of renal failure   Medications: I have reviewed the patient's current medications.  Results for orders placed during the hospital encounter of 11/13/12 (from the past 48 hour(s))  OCCULT BLOOD, POC DEVICE     Status: Abnormal   Collection Time    11/13/12  9:14 PM      Result Value Range    Fecal Occult Bld POSITIVE (*) NEGATIVE  CBC WITH DIFFERENTIAL     Status: Abnormal   Collection Time    11/13/12  9:29 PM      Result Value Range   WBC 14.7 (*) 4.0 - 10.5 K/uL   RBC 2.66 (*) 4.22 - 5.81 MIL/uL   Hemoglobin 7.7 (*) 13.0 - 17.0 g/dL   HCT 16.1 (*) 09.6 - 04.5 %   MCV 85.0  78.0 - 100.0 fL   MCH 28.9  26.0 - 34.0 pg   MCHC 34.1  30.0 - 36.0 g/dL   RDW 40.9  81.1 - 91.4 %   Platelets 332  150 - 400 K/uL   Neutrophils Relative 86 (*) 43 - 77 %   Neutro Abs 12.6 (*) 1.7 - 7.7 K/uL   Lymphocytes Relative 6 (*) 12 - 46 %   Lymphs Abs 0.9  0.7 - 4.0 K/uL   Monocytes Relative 8  3 - 12 %   Monocytes Absolute 1.1 (*) 0.1 - 1.0 K/uL   Eosinophils Relative 0  0 - 5 %   Eosinophils Absolute 0.0  0.0 - 0.7 K/uL   Basophils Relative 0  0 - 1 %   Basophils Absolute 0.0  0.0 - 0.1 K/uL  BASIC METABOLIC PANEL  Status: Abnormal   Collection Time    11/13/12  9:29 PM      Result Value Range   Sodium 125 (*) 135 - 145 mEq/L   Potassium 6.5 (*) 3.5 - 5.1 mEq/L   Comment: NO VISIBLE HEMOLYSIS     CRITICAL RESULT CALLED TO, READ BACK BY AND VERIFIED WITH:     CCARNEAL RN AT 2210 ON [id number] BY DLONG   Chloride 90 (*) 96 - 112 mEq/L   CO2 15 (*) 19 - 32 mEq/L   Glucose, Bld 102 (*) 70 - 99 mg/dL   BUN 045 (*) 6 - 23 mg/dL   Comment: REPEATED TO VERIFY   Creatinine, Ser 8.41 (*) 0.50 - 1.35 mg/dL   Calcium 8.6  8.4 - 40.9 mg/dL   GFR calc non Af Amer 5 (*) >90 mL/min   GFR calc Af Amer 6 (*) >90 mL/min   Comment:            The eGFR has been calculated     using the CKD EPI equation.     This calculation has not been     validated in all clinical     situations.     eGFR's persistently     <90 mL/min signify     possible Chronic Kidney Disease.  APTT     Status: Abnormal   Collection Time    11/13/12  9:29 PM      Result Value Range   aPTT 73 (*) 24 - 37 seconds   Comment:            IF BASELINE aPTT IS ELEVATED,     SUGGEST PATIENT RISK ASSESSMENT     BE USED TO  DETERMINE APPROPRIATE     ANTICOAGULANT THERAPY.  PROTIME-INR     Status: Abnormal   Collection Time    11/13/12  9:29 PM      Result Value Range   Prothrombin Time 25.7 (*) 11.6 - 15.2 seconds   INR 2.48 (*) 0.00 - 1.49  TYPE AND SCREEN     Status: None   Collection Time    11/13/12  9:29 PM      Result Value Range   ABO/RH(D) A NEG     Antibody Screen NEG     Sample Expiration 11/16/2012     Unit Number [id number]     Blood Component Type RED CELLS,LR     Unit division 00     Status of Unit ISSUED     Transfusion Status OK TO TRANSFUSE     Crossmatch Result Compatible     Unit Number [id number]     Blood Component Type RED CELLS,LR     Unit division 00     Status of Unit ISSUED     Transfusion Status OK TO TRANSFUSE     Crossmatch Result Compatible    PREPARE RBC (CROSSMATCH)     Status: None   Collection Time    11/13/12  9:30 PM      Result Value Range   Order Confirmation ORDER PROCESSED BY BLOOD BANK    POCT I-STAT, CHEM 8     Status: Abnormal   Collection Time    11/13/12 10:17 PM      Result Value Range   Sodium 126 (*) 135 - 145 mEq/L   Potassium 6.3 (*) 3.5 - 5.1 mEq/L   Chloride 102  96 - 112 mEq/L   BUN 140 (*) 6 - 23 mg/dL  Creatinine, Ser 8.60 (*) 0.50 - 1.35 mg/dL   Glucose, Bld 96  70 - 99 mg/dL   Calcium, Ion 4.54 (*) 1.13 - 1.30 mmol/L   TCO2 15  0 - 100 mmol/L   Hemoglobin 7.1 (*) 13.0 - 17.0 g/dL   HCT 09.8 (*) 11.9 - 14.7 %   Comment NOTIFIED PHYSICIAN    URINALYSIS, ROUTINE W REFLEX MICROSCOPIC     Status: Abnormal   Collection Time    11/13/12 10:52 PM      Result Value Range   Color, Urine YELLOW  YELLOW   APPearance CLOUDY (*) CLEAR   Specific Gravity, Urine 1.014  1.005 - 1.030   pH 6.0  5.0 - 8.0   Glucose, UA NEGATIVE  NEGATIVE mg/dL   Hgb urine dipstick LARGE (*) NEGATIVE   Bilirubin Urine NEGATIVE  NEGATIVE   Ketones, ur NEGATIVE  NEGATIVE mg/dL   Protein, ur NEGATIVE  NEGATIVE mg/dL   Urobilinogen, UA 0.2  0.0 - 1.0  mg/dL   Nitrite NEGATIVE  NEGATIVE   Leukocytes, UA MODERATE (*) NEGATIVE  URINE MICROSCOPIC-ADD ON     Status: None   Collection Time    11/13/12 10:52 PM      Result Value Range   Squamous Epithelial / LPF RARE  RARE   WBC, UA 7-10  <3 WBC/hpf   RBC / HPF 21-50  <3 RBC/hpf   Bacteria, UA RARE  RARE  MRSA PCR SCREENING     Status: None   Collection Time    11/14/12 12:54 AM      Result Value Range   MRSA by PCR NEGATIVE  NEGATIVE   Comment:            The GeneXpert MRSA Assay (FDA     approved for NASAL specimens     only), is one component of a     comprehensive MRSA colonization     surveillance program. It is not     intended to diagnose MRSA     infection nor to guide or     monitor treatment for     MRSA infections.  URINALYSIS, ROUTINE W REFLEX MICROSCOPIC     Status: Abnormal   Collection Time    11/14/12  1:18 AM      Result Value Range   Color, Urine YELLOW  YELLOW   APPearance CLOUDY (*) CLEAR   Specific Gravity, Urine 1.010  1.005 - 1.030   pH 5.5  5.0 - 8.0   Glucose, UA NEGATIVE  NEGATIVE mg/dL   Hgb urine dipstick LARGE (*) NEGATIVE   Bilirubin Urine NEGATIVE  NEGATIVE   Ketones, ur NEGATIVE  NEGATIVE mg/dL   Protein, ur 30 (*) NEGATIVE mg/dL   Urobilinogen, UA 0.2  0.0 - 1.0 mg/dL   Nitrite NEGATIVE  NEGATIVE   Leukocytes, UA MODERATE (*) NEGATIVE  URINE MICROSCOPIC-ADD ON     Status: None   Collection Time    11/14/12  1:18 AM      Result Value Range   Squamous Epithelial / LPF RARE  RARE   WBC, UA 21-50  <3 WBC/hpf   RBC / HPF TOO NUMEROUS TO COUNT  <3 RBC/hpf   Bacteria, UA RARE  RARE  BASIC METABOLIC PANEL     Status: Abnormal   Collection Time    11/14/12  8:00 AM      Result Value Range   Sodium 134 (*) 135 - 145 mEq/L   Comment: DELTA CHECK NOTED  REPEATED TO VERIFY   Potassium 5.2 (*) 3.5 - 5.1 mEq/L   Comment: DELTA CHECK NOTED     REPEATED TO VERIFY   Chloride 98  96 - 112 mEq/L   CO2 17 (*) 19 - 32 mEq/L   Glucose, Bld 91  70  - 99 mg/dL   BUN 409 (*) 6 - 23 mg/dL   Creatinine, Ser 8.11 (*) 0.50 - 1.35 mg/dL   Calcium 8.6  8.4 - 91.4 mg/dL   GFR calc non Af Amer 7 (*) >90 mL/min   GFR calc Af Amer 8 (*) >90 mL/min   Comment:            The eGFR has been calculated     using the CKD EPI equation.     This calculation has not been     validated in all clinical     situations.     eGFR's persistently     <90 mL/min signify     possible Chronic Kidney Disease.  CBC     Status: Abnormal   Collection Time    11/14/12  8:00 AM      Result Value Range   WBC 13.0 (*) 4.0 - 10.5 K/uL   RBC 3.20 (*) 4.22 - 5.81 MIL/uL   Hemoglobin 9.5 (*) 13.0 - 17.0 g/dL   Comment: DELTA CHECK NOTED     REPEATED TO VERIFY     DURING TRANSFUSION   HCT 26.8 (*) 39.0 - 52.0 %   MCV 83.8  78.0 - 100.0 fL   MCH 29.7  26.0 - 34.0 pg   MCHC 35.4  30.0 - 36.0 g/dL   RDW 78.2  95.6 - 21.3 %   Platelets 284  150 - 400 K/uL  PRO B NATRIURETIC PEPTIDE     Status: Abnormal   Collection Time    11/14/12  8:00 AM      Result Value Range   Pro B Natriuretic peptide (BNP) 1488.0 (*) 0 - 450 pg/mL  PROTIME-INR     Status: Abnormal   Collection Time    11/14/12  8:00 AM      Result Value Range   Prothrombin Time 19.1 (*) 11.6 - 15.2 seconds   INR 1.66 (*) 0.00 - 1.49  HEPATIC FUNCTION PANEL     Status: Abnormal   Collection Time    11/14/12  8:00 AM      Result Value Range   Total Protein 5.6 (*) 6.0 - 8.3 g/dL   Albumin 2.2 (*) 3.5 - 5.2 g/dL   AST 98 (*) 0 - 37 U/L   ALT 61 (*) 0 - 53 U/L   Alkaline Phosphatase 172 (*) 39 - 117 U/L   Total Bilirubin 1.1  0.3 - 1.2 mg/dL   Bilirubin, Direct 0.5 (*) 0.0 - 0.3 mg/dL   Indirect Bilirubin 0.6  0.3 - 0.9 mg/dL  URIC ACID     Status: Abnormal   Collection Time    11/14/12  8:00 AM      Result Value Range   Uric Acid, Serum 8.0 (*) 4.0 - 7.8 mg/dL  SODIUM, URINE, RANDOM     Status: None   Collection Time    11/14/12  9:35 AM      Result Value Range   Sodium, Ur 50     CREATININE, URINE, RANDOM     Status: None   Collection Time    11/14/12  9:35 AM      Result Value Range  Creatinine, Urine 52.3    HEMOGLOBIN AND HEMATOCRIT, BLOOD     Status: Abnormal   Collection Time    11/14/12 12:28 PM      Result Value Range   Hemoglobin 8.8 (*) 13.0 - 17.0 g/dL   HCT 16.1 (*) 09.6 - 04.5 %    US Renal  11/14/2012  *RADIOLOGY REPORT*  Clinical Data: Acute renal failure  RENAL/URINARY TRACT ULTRASOUND COMPLETE  Comparison:  CT abdomen pelvis dated 11/11/2009  Findings:  Right Kidney:  Poorly visualized.  Measures 10.2 cm.  Mild hydronephrosis.  Left Kidney:  Poorly visualized.  Measures 9.9 cm.  1.7 x 2.3 x 2.2 cm lower pole cyst.  Mild hydronephrosis.  Bladder:  Decompressed by indwelling Foley catheter.  IMPRESSION: Bilateral kidneys are poorly visualized.  Mild bilateral hydronephrosis is suspected.  2.3 cm left lower pole renal cyst.   Original Report Authenticated By: Charline Bills, M.D.    Portable Chest 1 View  11/14/2012  *RADIOLOGY REPORT*  Clinical Data: Bilateral basilar infiltrates.  PORTABLE CHEST - 1 VIEW  Comparison: 10/29/2012 chest radiograph.  Findings: Low lung volumes are present with elevation of the left hemidiaphragm greater than right.  Increasing atelectasis at the bases.  Superimposed airspace disease may be present, particularly at the left lung base.  Right glenohumeral osteoarthritis is noted. Surgical clips in the right upper quadrant.  Cardiopericardial silhouette appears unchanged allowing for lower volumes of inspiration.  IMPRESSION: Low volume chest.  Left greater than right basilar atelectasis. Superimposed left lower lobe airspace disease cannot be excluded.   Original Report Authenticated By: Andreas Newport, M.D.    Review of Systems  Constitutional: Positive for malaise/fatigue.  Gastrointestinal: Positive for blood in stool. Negative for heartburn, nausea, vomiting, abdominal pain, diarrhea and constipation.  Musculoskeletal:  Positive for joint pain.  Skin: Negative.   Neurological: Positive for weakness.  Psychiatric/Behavioral: The patient is nervous/anxious and has insomnia.    Blood pressure 107/47, pulse 87, temperature 98.4 F (36.9 C), temperature source Core (Comment), resp. rate 13, height 5\' 5"  (1.651 m), weight 69.1 kg (152 lb 5.4 oz), SpO2 100.00%. Physical Exam  Constitutional: He appears lethargic.  HENT:  Head: Normocephalic and atraumatic.  Eyes: Conjunctivae and EOM are normal.  Neck: Neck supple.  Cardiovascular: Normal rate and regular rhythm.   Respiratory: Effort normal and breath sounds normal.  GI: Soft. Bowel sounds are normal.  Neurological: He appears lethargic.  Skin: Skin is warm and dry.   Assessment/Plan: 1) Melenic stools with anemia-drop in hemoglobin to 7.7 gm/dl: improved with transfusion of PRBC's. Will plan an EGD on Friday. Continue serial CBC's.  2) Acute kidney injury.  3) Hyperkalemia: improving.  4) ?Uremic encephalopthy.  5) Insomnia. 6) Gout on Allopurinol. Stormi Vandevelde 11/14/2012, 5:49 PM

## 2012-11-14 NOTE — Progress Notes (Signed)
CARE MANAGEMENT NOTE 11/14/2012  Patient:  [name], [name]   Account Number:  [id number]  Date Initiated:  11/14/2012  Documentation initiated by:  Forney Kleinpeter  Subjective/Objective Assessment:   pt with recent history of a rt hip revision, dcd on [id number] to [facility],readmitted on [id number] with k-6.7,na-125, ams, hgb 7.7,     Action/Plan:   has been resident at [facility].   Anticipated DC Date:  11/17/2012   Anticipated DC Plan:  SKILLED NURSING FACILITY  In-house referral  Clinical Social Worker      DC Planning Services  NA      [facility] Choice  NA   Choice offered to / List presented to:  NA   DME arranged  NA      DME agency  NA     HH arranged  NA      HH agency  NA   Status of service:  In process, will continue to follow Medicare Important Message given?  NA - LOS <3 / Initial given by admissions (If response is "NO", the following Medicare IM given date fields will be blank) Date Medicare IM given:   Date Additional Medicare IM given:    Discharge Disposition:    Per UR Regulation:  Reviewed for med. necessity/level of care/duration of stay  If discussed at Long Length of Stay Meetings, dates discussed:    Comments:  [id number]/Meshilem Machuca Earlene Plater, RN, BSN, CCM:  CHART REVIEWED AND UPDATED.  Next chart review due on [id number]. NO DISCHARGE NEEDS PRESENT AT THIS TIME. CASE MANAGEMENT [phone]

## 2012-11-15 DIAGNOSIS — G9341 Metabolic encephalopathy: Secondary | ICD-10-CM

## 2012-11-15 DIAGNOSIS — D649 Anemia, unspecified: Secondary | ICD-10-CM

## 2012-11-15 LAB — CBC
Hemoglobin: 8.9 g/dL — ABNORMAL LOW (ref 13.0–17.0)
MCH: 29.2 pg (ref 26.0–34.0)
MCHC: 34.4 g/dL (ref 30.0–36.0)
Platelets: 220 10*3/uL (ref 150–400)
RBC: 3.05 MIL/uL — ABNORMAL LOW (ref 4.22–5.81)

## 2012-11-15 LAB — TYPE AND SCREEN
ABO/RH(D): A NEG
Antibody Screen: NEGATIVE
Unit division: 0

## 2012-11-15 LAB — URINE CULTURE
Colony Count: NO GROWTH
Culture: NO GROWTH

## 2012-11-15 LAB — BASIC METABOLIC PANEL
BUN: 69 mg/dL — ABNORMAL HIGH (ref 6–23)
CO2: 20 mEq/L (ref 19–32)
Chloride: 109 mEq/L (ref 96–112)
Creatinine, Ser: 4.01 mg/dL — ABNORMAL HIGH (ref 0.50–1.35)
Potassium: 3.4 mEq/L — ABNORMAL LOW (ref 3.5–5.1)

## 2012-11-15 LAB — GLUCOSE, CAPILLARY: Glucose-Capillary: 101 mg/dL — ABNORMAL HIGH (ref 70–99)

## 2012-11-15 MED ORDER — METHOCARBAMOL 100 MG/ML IJ SOLN: 500.0000 mg | Freq: Three times a day (TID) | INTRAVENOUS | Status: DC | PRN

## 2012-11-15 MED ORDER — SODIUM CHLORIDE 0.9 % IV SOLN: INTRAVENOUS | Status: DC

## 2012-11-15 MED ORDER — OXYCODONE-ACETAMINOPHEN 5-325 MG PO TABS
1.0000 | ORAL_TABLET | Freq: Four times a day (QID) | ORAL | Status: DC | PRN
  Administered 2012-11-15 – 2012-11-18 (×4): 1 via ORAL
  Filled 2012-11-15 (×4): qty 1

## 2012-11-15 MED ORDER — POTASSIUM CHLORIDE CRYS ER 20 MEQ PO TBCR
40.0000 meq | EXTENDED_RELEASE_TABLET | Freq: Once | ORAL | Status: AC
  Administered 2012-11-15: 40 meq via ORAL
  Filled 2012-11-15: qty 2

## 2012-11-15 MED ORDER — SODIUM CHLORIDE 0.9 % IV SOLN
INTRAVENOUS | Status: DC
  Administered 2012-11-15 – 2012-11-17 (×3): via INTRAVENOUS

## 2012-11-15 NOTE — Progress Notes (Signed)
Subjective: Feeling better, creat down to 4, good UOP  Objective Vital signs in last 24 hours: Filed Vitals:   11/14/12 2300 11/15/12 0000 11/15/12 0400 11/15/12 0800  BP: 99/42 101/43 108/48 120/50  Pulse: 74 77 81 84  Temp: 98.4 F (36.9 C) 98.6 F (37 C) 98.6 F (37 C) 98.6 F (37 C)  TempSrc:    Core (Comment)  Resp: 12 11 13 13   Height:      Weight:      SpO2: 99% 97% 99% 99%   Weight change:   Intake/Output Summary (Last 24 hours) at 11/15/12 1021 Last data filed at 11/15/12 1000  Gross per 24 hour  Intake 3770.42 ml  Output   3760 ml  Net  10.42 ml   Labs: Basic Metabolic Panel:  Recent Labs Lab 11/13/12 2129 11/13/12 2217 11/14/12 0800 11/14/12 1822 11/15/12 0335  NA 125* 126* 134* 136 140  K 6.5* 6.3* 5.2* 3.9 3.4*  CL 90* 102 98 104 109  CO2 15*  --  17* 17* 20  GLUCOSE 102* 96 91 95 107*  BUN 119* 140* 104* 86* 69*  CREATININE 8.41* 8.60* 6.79* 5.11* 4.01*  CALCIUM 8.6  --  8.6 7.9* 8.0*   Liver Function Tests:  Recent Labs Lab 11/14/12 0800  AST 98*  ALT 61*  ALKPHOS 172*  BILITOT 1.1  PROT 5.6*  ALBUMIN 2.2*   No results found for this basename: LIPASE, AMYLASE,  in the last 168 hours No results found for this basename: AMMONIA,  in the last 168 hours CBC:  Recent Labs Lab 11/13/12 2129 11/13/12 2217 11/14/12 0800 11/14/12 1228 11/14/12 1822  WBC 14.7*  --  13.0*  --   --   NEUTROABS 12.6*  --   --   --   --   HGB 7.7* 7.1* 9.5* 8.8* 8.8*  HCT 22.6* 21.0* 26.8* 25.2* 25.0*  MCV 85.0  --  83.8  --   --   PLT 332  --  284  --   --    PT/INR: @LABRCNTIP (inr:5)   Scheduled Meds ) . allopurinol  100 mg Oral q morning - 10a  . cefTRIAXone (ROCEPHIN)  IV  1 g Intravenous QHS  . docusate sodium  100 mg Oral BID  . sodium chloride  3 mL Intravenous Q12H  . tamsulosin  0.4 mg Oral QPC supper    Physical Exam:  Blood pressure 120/50, pulse 84, temperature 98.6 F (37 C), temperature source Core (Comment), resp. rate 13,  height 5\' 5"  (1.651 m), weight 69.1 kg (152 lb 5.4 oz), SpO2 99.00%.  Gen: elderly male, confused, trembling, verbalizes minimally, not orientd  Skin: no rash, cyanosis  HEENT: EOMI, sclera anicteric, throat dry  Neck: no JVD, no LAN  Chest: clear bilaterally  CV: regular, no rub or gallop, no carotid or femoral bruits  Abdomen: soft, nontender, no mass or HSM  Ext: +pedal edema 1+ bilat, no joint effusion or deformity, no gangrene or ulceration  Neuro: moves UE's, LE's weak, symmetric weakness, confused   UA- 21-50wbc, TNTC rbc's, rare bact 30 prot   Impression/Rec  1. Acute on CKD w +bilat hydronephrosis- prob bladder outlet obstruction from known BPH. Renal failure resolving with IVF's and foley catheter. Defer to primary / urology regarding management of bladder outlet obstruction. Will sign off 2. CKD III1- baseline creat 1.6-1.7 ; due to HTN (was on meds years back, not now), gouty nephropathy, chronic hydro?due to BPH 3. GIB- GI evaluating 4.  Hx HTN- had been on BP meds in 2012, none now 5. Hx BPH 6. Hx gout 7. Recent R THR 3/110/14  Vinson Moselle  MD [phone] pgr    [phone] cell 11/15/2012, 10:21 AM

## 2012-11-15 NOTE — Progress Notes (Signed)
Subjective: Feeling okay.  No acute events.  Objective: Vital signs in last 24 hours: Temp:  [98.2 F (36.8 C)-98.8 F (37.1 C)] 98.5 F (36.9 C) (03/27 1202) Pulse Rate:  [73-97] 97 (03/27 1202) Resp:  [11-18] 18 (03/27 1202) BP: (87-131)/(38-60) 131/57 mmHg (03/27 1202) SpO2:  [97 %-100 %] 100 % (03/27 1202) Last BM Date: 11/14/12  Intake/Output from previous day: 03/26 0701 - 03/27 0700 In: 3698.4 [I.V.:2648.4; IV Piggyback:1050] Out: 4070 [Urine:4070] Intake/Output this shift: Total I/O In: 708.8 [P.O.:240; I.V.:468.8] Out: 601 [Urine:600; Stool:1]  General appearance: alert and no distress GI: soft, non-tender; bowel sounds normal; no masses,  no organomegaly  Lab Results:  Recent Labs  11/13/12 2129  11/14/12 0800 11/14/12 1228 11/14/12 1822 11/15/12 0920  WBC 14.7*  --  13.0*  --   --  6.1  HGB 7.7*  < > 9.5* 8.8* 8.8* 8.9*  HCT 22.6*  < > 26.8* 25.2* 25.0* 25.9*  PLT 332  --  284  --   --  220  < > = values in this interval not displayed. BMET  Recent Labs  11/14/12 0800 11/14/12 1822 11/15/12 0335  NA 134* 136 140  K 5.2* 3.9 3.4*  CL 98 104 109  CO2 17* 17* 20  GLUCOSE 91 95 107*  BUN 104* 86* 69*  CREATININE 6.79* 5.11* 4.01*  CALCIUM 8.6 7.9* 8.0*   LFT  Recent Labs  11/14/12 0800  PROT 5.6*  ALBUMIN 2.2*  AST 98*  ALT 61*  ALKPHOS 172*  BILITOT 1.1  BILIDIR 0.5*  IBILI 0.6   PT/INR  Recent Labs  11/14/12 0800 11/15/12 0920  LABPROT 19.1* 17.0*  INR 1.66* 1.42   Hepatitis Panel No results found for this basename: HEPBSAG, HCVAB, HEPAIGM, HEPBIGM,  in the last 72 hours C-Diff No results found for this basename: CDIFFTOX,  in the last 72 hours Fecal Lactopherrin No results found for this basename: FECLLACTOFRN,  in the last 72 hours  Studies/Results: US Renal  11/14/2012  *RADIOLOGY REPORT*  Clinical Data: Acute renal failure  RENAL/URINARY TRACT ULTRASOUND COMPLETE  Comparison:  CT abdomen pelvis dated 11/11/2009   Findings:  Right Kidney:  Poorly visualized.  Measures 10.2 cm.  Mild hydronephrosis.  Left Kidney:  Poorly visualized.  Measures 9.9 cm.  1.7 x 2.3 x 2.2 cm lower pole cyst.  Mild hydronephrosis.  Bladder:  Decompressed by indwelling Foley catheter.  IMPRESSION: Bilateral kidneys are poorly visualized.  Mild bilateral hydronephrosis is suspected.  2.3 cm left lower pole renal cyst.   Original Report Authenticated By: Charline Bills, M.D.    Portable Chest 1 View  11/14/2012  *RADIOLOGY REPORT*  Clinical Data: Bilateral basilar infiltrates.  PORTABLE CHEST - 1 VIEW  Comparison: 10/29/2012 chest radiograph.  Findings: Low lung volumes are present with elevation of the left hemidiaphragm greater than right.  Increasing atelectasis at the bases.  Superimposed airspace disease may be present, particularly at the left lung base.  Right glenohumeral osteoarthritis is noted. Surgical clips in the right upper quadrant.  Cardiopericardial silhouette appears unchanged allowing for lower volumes of inspiration.  IMPRESSION: Low volume chest.  Left greater than right basilar atelectasis. Superimposed left lower lobe airspace disease cannot be excluded.   Original Report Authenticated By: Andreas Newport, M.D.     Medications:  Scheduled: . allopurinol  100 mg Oral q morning - 10a  . cefTRIAXone (ROCEPHIN)  IV  1 g Intravenous QHS  . docusate sodium  100 mg Oral BID  .  sodium chloride  3 mL Intravenous Q12H  . tamsulosin  0.4 mg Oral QPC supper   Continuous: . sodium chloride 100 mL/hr at 11/15/12 0815  . pantoprozole (PROTONIX) infusion 8 mg/hr (11/15/12 1610)    Assessment/Plan: 1) Melena. 2) Anemia. 3) Renal failure.   The patient appears to be progressing.  It will be prudent to perform an EGD for further evaluation.  Plan: 1) EGD tomorrow.   LOS: 2 days   [name] D 11/15/2012, 3:28 PM

## 2012-11-15 NOTE — Clinical Social Work Placement (Signed)
Clinical Social Work Department CLINICAL SOCIAL WORK PLACEMENT NOTE 11/15/2012  Patient:  ELIAS, DENNINGTON  Account Number:  [id number] Admit date:  11/13/2012  Clinical Social Worker:  Jodelle Red  Date/time:  11/15/2012 10:45 AM  Clinical Social Work is seeking post-discharge placement for this patient at the following level of care:   SKILLED NURSING   (*CSW will update this form in Epic as items are completed)   11/14/2012  Patient/family provided with [facility] Department of Clinical Social Work's list of facilities offering this level of care within the geographic area requested by the patient (or if unable, by the patient's family).  11/14/2012  Patient/family informed of their freedom to choose among providers that offer the needed level of care, that participate in Medicare, Medicaid or managed care program needed by the patient, have an available bed and are willing to accept the patient.  11/14/2012  Patient/family informed of [facility]' ownership interest in [facility], as well as of the fact that they are under no obligation to receive care at this facility.  PASARR submitted to EDS on  EXISTING PASARR number received from EDS on   FL2 transmitted to all facilities in geographic area requested by pt/family on  11/14/2012 FL2 transmitted to all facilities within larger geographic area on   Patient informed that his/her managed care company has contracts with or will negotiate with  certain facilities, including the following:     Patient/family informed of bed offers received:   Patient chooses bed at  Physician recommends and patient chooses bed at    Patient to be transferred to  on   Patient to be transferred to facility by   The following physician request were entered in Epic:   Additional Comments: FAMILY DOES NOT WANT TO RETURN TO [facility].  Doreen Salvage, LCSW ICU/Stepdown Clinical Social Worker [facility] Cell [phone] Hours 8am-1200pm M-F

## 2012-11-15 NOTE — Progress Notes (Signed)
[facility] PROGRESS NOTE  [name]:[id number] DOB: October 14, 1924 DOA: 11/13/2012 PCP: Verneita Griffes, MD  Brief narrative  79 year old male with history of BPH, recent right hip revision surgery, mild CK D. with creatinine of 1.4 one week back, recent UTI treated with Bactrim was sent from the nursing home for confusion, melanotic stool with drop in H&H, hyperkalemia and acute kidney injury. Patient septic with melanotic stool with significant drop in H&H, hyperkalemia, acute kidney injury with uremic symptoms and metabolic acidosis.    Assessment/Plan:  Septic shock  Patient presented with leukocytosis, hypotension and tachycardia with melanotic stool and drop in H&H. Also has hyperkalemia,  significant acute kidney injury with uremic encephalopathy an anion gap metabolic acidosis.  -Patient admitted to step down for closer monitoring. Received 2 units PRBC and his H&H have improved and been stable. A Foley was placed in draining bloody urine.  - Patient started on bicarbonate and her renal function has been now improving. Acute kidney injury likely in the setting of obstructive uropathy with known history of BPH with associated dehydration. Also possible for acute interstitial nephritis with recent use of Bactrim. -Avoid nephrotoxins.  - Bicarbonate improved. Switch fluid to normal saline. Renal function improving. -Monitor serial H&H.  -Dr. hung up with a GI consulted and plan on EGD in a.m. Was started on Protonix drip on 3/26 -Continue with strict I/O monitoring   Acute on chronic kidney disease  As outlined above. Uremic symptoms have resolved. Continue IV fluids. Monitor urine output and electrolytes. Renal ultrasound suggests bilateral hydronephrosis and very likely for bladder outlet obstruction. Patient followed with Dr. Brunilda Payor  in the past for his BPH and has been consulted. -Continue Flomax  -Appreciate renal recommendations.  -Recommend for repeat renal ultrasound  to evaluate hydronephrosis in 2-3 days   Anemia with GI bleeding  Continue to monitor serial H&H. Hemoglobin improved after 2 units PRBC.  -Continue IV Protonix  - Plan on EGD in a.m.  Hyponatremia  Possibly in the setting of dehydration and now resolved  Hyperkalemia  Secondary to acute kidney injury and possibly recent Bactrim use. Given insulin with D50 and albuterol in the ED. Also given Kayexalate. Now improved and low today. Will replenish   Leukocytosis  Urine culture sent. Treating empirically with Rocephin.   Coagulopathy  Patient on pradaxa for DVT prophylaxis following recent total hip revision. Being held given high INR and melanotic stool.   Code Status: Full code  Family Communication: None at bedside  Disposition Plan: Reduced down monitoring for today Consultants:  Renal (Dr. Arlean Hopping)  GI [facility]) Urology ( Dr Brunilda Payor)  Procedures:  For EGD in a.m. Antibiotics:  IV Rocephin   HPI/Subjective: Patient seen and examined this morning. He appears much alert and oriented at baseline. Denies any symptoms. Has not had a bowel movement since yesterday.  Objective: Filed Vitals:   11/14/12 2300 11/15/12 0000 11/15/12 0400 11/15/12 0800  BP: 99/42 101/43 108/48 120/50  Pulse: 74 77 81 84  Temp: 98.4 F (36.9 C) 98.6 F (37 C) 98.6 F (37 C) 98.6 F (37 C)  TempSrc:      Resp: 12 11 13 13   Height:      Weight:      SpO2: 99% 97% 99% 99%    Intake/Output Summary (Last 24 hours) at 11/15/12 0834 Last data filed at 11/15/12 0800  Gross per 24 hour  Intake 3748.42 ml  Output   3870 ml  Net -121.58 ml  Filed Weights   11/14/12 0000 11/14/12 0500  Weight: 65.9 kg (145 lb 4.5 oz) 69.1 kg (152 lb 5.4 oz)    Exam:  General: Elderly male lying in bed in no acute distress. Appears well oriented today. HEENT: No pallor, dry oral mucosa  Cardiovascular: S1 S2 normal, no murmurs rub or gallop  Respiratory: Clear to auscultation bilaterally  Abdomen:  Soft, nontender, nondistended, bowel sounds present, Foley draining pinkish urine  Musculoskeletal: Warm, no edema some pain over right hip with dressing CNS: Alert and awake, oriented x3, no tremors   Data Reviewed: Basic Metabolic Panel:  Recent Labs Lab 11/13/12 2129 11/13/12 2217 11/14/12 0800 11/14/12 1822 11/15/12 0335  NA 125* 126* 134* 136 140  K 6.5* 6.3* 5.2* 3.9 3.4*  CL 90* 102 98 104 109  CO2 15*  --  17* 17* 20  GLUCOSE 102* 96 91 95 107*  BUN 119* 140* 104* 86* 69*  CREATININE 8.41* 8.60* 6.79* 5.11* 4.01*  CALCIUM 8.6  --  8.6 7.9* 8.0*   Liver Function Tests:  Recent Labs Lab 11/14/12 0800  AST 98*  ALT 61*  ALKPHOS 172*  BILITOT 1.1  PROT 5.6*  ALBUMIN 2.2*   No results found for this basename: LIPASE, AMYLASE,  in the last 168 hours No results found for this basename: AMMONIA,  in the last 168 hours CBC:  Recent Labs Lab 11/13/12 2129 11/13/12 2217 11/14/12 0800 11/14/12 1228 11/14/12 1822  WBC 14.7*  --  13.0*  --   --   NEUTROABS 12.6*  --   --   --   --   HGB 7.7* 7.1* 9.5* 8.8* 8.8*  HCT 22.6* 21.0* 26.8* 25.2* 25.0*  MCV 85.0  --  83.8  --   --   PLT 332  --  284  --   --    Cardiac Enzymes: No results found for this basename: CKTOTAL, CKMB, CKMBINDEX, TROPONINI,  in the last 168 hours BNP (last 3 results)  Recent Labs  11/14/12 0800  PROBNP 1488.0*   CBG: No results found for this basename: GLUCAP,  in the last 168 hours  Recent Results (from the past 240 hour(s))  MRSA PCR SCREENING     Status: None   Collection Time    11/14/12 12:54 AM      Result Value Range Status   MRSA by PCR NEGATIVE  NEGATIVE Final   Comment:            The GeneXpert MRSA Assay (FDA     approved for NASAL specimens     only), is one component of a     comprehensive MRSA colonization     surveillance program. It is not     intended to diagnose MRSA     infection nor to guide or     monitor treatment for     MRSA infections.      Studies: US Renal  11/14/2012  *RADIOLOGY REPORT*  Clinical Data: Acute renal failure  RENAL/URINARY TRACT ULTRASOUND COMPLETE  Comparison:  CT abdomen pelvis dated 11/11/2009  Findings:  Right Kidney:  Poorly visualized.  Measures 10.2 cm.  Mild hydronephrosis.  Left Kidney:  Poorly visualized.  Measures 9.9 cm.  1.7 x 2.3 x 2.2 cm lower pole cyst.  Mild hydronephrosis.  Bladder:  Decompressed by indwelling Foley catheter.  IMPRESSION: Bilateral kidneys are poorly visualized.  Mild bilateral hydronephrosis is suspected.  2.3 cm left lower pole renal cyst.   Original Report Authenticated  By: Charline Bills, M.D.    Portable Chest 1 View  11/14/2012  *RADIOLOGY REPORT*  Clinical Data: Bilateral basilar infiltrates.  PORTABLE CHEST - 1 VIEW  Comparison: 10/29/2012 chest radiograph.  Findings: Low lung volumes are present with elevation of the left hemidiaphragm greater than right.  Increasing atelectasis at the bases.  Superimposed airspace disease may be present, particularly at the left lung base.  Right glenohumeral osteoarthritis is noted. Surgical clips in the right upper quadrant.  Cardiopericardial silhouette appears unchanged allowing for lower volumes of inspiration.  IMPRESSION: Low volume chest.  Left greater than right basilar atelectasis. Superimposed left lower lobe airspace disease cannot be excluded.   Original Report Authenticated By: Andreas Newport, M.D.     Scheduled Meds: . allopurinol  100 mg Oral q morning - 10a  . cefTRIAXone (ROCEPHIN)  IV  1 g Intravenous QHS  . docusate sodium  100 mg Oral BID  . potassium chloride  40 mEq Oral Once  . sodium chloride  3 mL Intravenous Q12H  . tamsulosin  0.4 mg Oral QPC supper   Continuous Infusions: . sodium chloride 100 mL/hr at 11/15/12 0815  . pantoprozole (PROTONIX) infusion 8 mg/hr (11/15/12 6213)  .  sodium bicarbonate infusion 1/4 NS 1000 mL 100 mL/hr at 11/14/12 0120     Time spent: *35 minutes    Eddie North  [facility] Pager [phone]. If 7PM-7AM, please contact night-coverage at [email or web address], password [facility] 11/15/2012, 8:34 AM  LOS: 2 days

## 2012-11-15 NOTE — Consult Note (Signed)
Urology Consult  Referring physician: Dr Theda Belfast Dhungel Reason for referral: hydronephrosis, renal insufficiency  Chief Complaint: Confusion, elevated BUN and creatinine.  History of Present Illness: The patient is a 79 years old Horn who was admitted on 3/25with confusion,hyperkalemia and renal insufficiency.  He had revision right hip surgery on 3/11.  He was treated with Bactrim for UTI.  He has been on tamsulosin for symptoms of bladder outlet obstruction.  He reports that he was voiding well until he was admitted for confusion.  He was brought to the ER for confusion, elevated BUN and creatinine and hyperkalemia.  Creatinine in the ER was 8.6 baseline: 1.4 on 3/13.  A Foley catheter was inserted in the bladder.  I do not know how much urine was drained out of the bladder at that time.  Renal ultrasound showed mild bilateral hydronephrosis.  Creatinine has been coming down and is now 4.01.  He was also treated for hyperkalemia and his serum potassium is 3.4.  Today the patient is alert and oriented.  Past Medical History  Diagnosis Date  . Gout   . Insomnia   . BPH (benign prostatic hyperplasia)   . Kidney disorder   . Complication of anesthesia     hard to wake up -2011 after hernia surgery   . Arthritis   . Hypertension     patient denies on 10/25/12 on no meds    Past Surgical History  Procedure Laterality Date  . Cataract extraction    . Cholecystectomy    . Hernia repair  02/22/11    right  . Joint replacement      fractured hip  . Total hip arthroplasty Right 10/29/2012    Procedure: CONVERSION OF PREVIOUS HIP SURGERY TO RIGHT TOTAL HIP AND REMOVAL OF IM NAIL ;  Surgeon: Shelda Pal, MD;  Location: [facility];  Service: Orthopedics;  Laterality: Right;    Medications: I have reviewed the patient's current medications. Allergies:  Allergies  Allergen Reactions  . Nsaids     REACTION: hx of renal failure    Family History  Problem Relation Age of Onset  . Heart failure  Mother   . Heart failure Father   . Diabetes Neg Hx    Social History:  reports that he has never smoked. He has never used smokeless tobacco. He reports that he does not drink alcohol or use illicit drugs.  ROS: All systems are reviewed and negative except as noted.   Physical Exam:  Vital signs in last 24 hours: Temp:  [98.2 F (36.8 C)-98.8 F (37.1 C)] 98.2 F (36.8 C) (03/27 2046) Pulse Rate:  [74-97] 79 (03/27 2046) Resp:  [11-18] 16 (03/27 2046) BP: (99-131)/(42-60) 123/51 mmHg (03/27 2046) SpO2:  [97 %-100 %] 100 % (03/27 2046) HEENT: Normal.  No cervical adenopathy.  No thyromegaly Cardiovascular: Skin warm; not flushed Respiratory: Breaths quiet; no shortness of breath Abdomen: No masses Neurological: Normal sensation to touch Musculoskeletal: Normal motor function arms and legs Lymphatics: No inguinal adenopathy Skin: No rashes Genitourinary:Penis is circumcised.  He has an indwelling Foley that is draining pinkish urine. Scrotum is normal in appearance.  Testicles are normal  Urine culture 3/27: no growth  Laboratory Data:  Results for orders placed during the hospital encounter of 11/13/12 (from the past 72 hour(s))  OCCULT BLOOD, POC DEVICE     Status: Abnormal   Collection Time    11/13/12  9:14 PM      Result Value Range  Fecal Occult Bld POSITIVE (*) NEGATIVE  CBC WITH DIFFERENTIAL     Status: Abnormal   Collection Time    11/13/12  9:29 PM      Result Value Range   WBC 14.7 (*) 4.0 - 10.5 K/uL   RBC 2.66 (*) 4.22 - 5.81 MIL/uL   Hemoglobin 7.7 (*) 13.0 - 17.0 g/dL   HCT 16.1 (*) 09.6 - 04.5 %   MCV 85.0  78.0 - 100.0 fL   MCH 28.9  26.0 - 34.0 pg   MCHC 34.1  30.0 - 36.0 g/dL   RDW 40.9  81.1 - 91.4 %   Platelets 332  150 - 400 K/uL   Neutrophils Relative 86 (*) 43 - 77 %   Neutro Abs 12.6 (*) 1.7 - 7.7 K/uL   Lymphocytes Relative 6 (*) 12 - 46 %   Lymphs Abs 0.9  0.7 - 4.0 K/uL   Monocytes Relative 8  3 - 12 %   Monocytes Absolute 1.1 (*) 0.1  - 1.0 K/uL   Eosinophils Relative 0  0 - 5 %   Eosinophils Absolute 0.0  0.0 - 0.7 K/uL   Basophils Relative 0  0 - 1 %   Basophils Absolute 0.0  0.0 - 0.1 K/uL  BASIC METABOLIC PANEL     Status: Abnormal   Collection Time    11/13/12  9:29 PM      Result Value Range   Sodium 125 (*) 135 - 145 mEq/L   Potassium 6.5 (*) 3.5 - 5.1 mEq/L   Comment: NO VISIBLE HEMOLYSIS     CRITICAL RESULT CALLED TO, READ BACK BY AND VERIFIED WITH:     CCARNEAL RN AT 2210 ON [id number] BY DLONG   Chloride 90 (*) 96 - 112 mEq/L   CO2 15 (*) 19 - 32 mEq/L   Glucose, Bld 102 (*) Jordan - 99 mg/dL   BUN 213 (*) 6 - 23 mg/dL   Comment: REPEATED TO VERIFY   Creatinine, Ser 8.41 (*) 0.50 - 1.35 mg/dL   Calcium 8.6  8.4 - 08.6 mg/dL   GFR calc non Af Amer 5 (*) >90 mL/min   GFR calc Af Amer 6 (*) >90 mL/min   Comment:            The eGFR has been calculated     using the CKD EPI equation.     This calculation has not been     validated in all clinical     situations.     eGFR's persistently     <90 mL/min signify     possible Chronic Kidney Disease.  APTT     Status: Abnormal   Collection Time    11/13/12  9:29 PM      Result Value Range   aPTT 73 (*) 24 - 37 seconds   Comment:            IF BASELINE aPTT IS ELEVATED,     SUGGEST PATIENT RISK ASSESSMENT     BE USED TO DETERMINE APPROPRIATE     ANTICOAGULANT THERAPY.  PROTIME-INR     Status: Abnormal   Collection Time    11/13/12  9:29 PM      Result Value Range   Prothrombin Time 25.7 (*) 11.6 - 15.2 seconds   INR 2.48 (*) 0.00 - 1.49  TYPE AND SCREEN     Status: None   Collection Time    11/13/12  9:29 PM      Result  Value Range   ABO/RH(D) A NEG     Antibody Screen NEG     Sample Expiration 11/16/2012     Unit Number [id number]     Blood Component Type RED CELLS,LR     Unit division 00     Status of Unit ISSUED,FINAL     Transfusion Status OK TO TRANSFUSE     Crossmatch Result Compatible     Unit Number [id number]     Blood Component  Type RED CELLS,LR     Unit division 00     Status of Unit ISSUED,FINAL     Transfusion Status OK TO TRANSFUSE     Crossmatch Result Compatible    PREPARE RBC (CROSSMATCH)     Status: None   Collection Time    11/13/12  9:30 PM      Result Value Range   Order Confirmation ORDER PROCESSED BY BLOOD BANK    POCT I-STAT, CHEM 8     Status: Abnormal   Collection Time    11/13/12 10:17 PM      Result Value Range   Sodium 126 (*) 135 - 145 mEq/L   Potassium 6.3 (*) 3.5 - 5.1 mEq/L   Chloride 102  96 - 112 mEq/L   BUN 140 (*) 6 - 23 mg/dL   Creatinine, Ser 3.08 (*) 0.50 - 1.35 mg/dL   Glucose, Bld 96  Jordan - 99 mg/dL   Calcium, Ion 6.57 (*) 1.13 - 1.30 mmol/L   TCO2 15  0 - 100 mmol/L   Hemoglobin 7.1 (*) 13.0 - 17.0 g/dL   HCT 84.6 (*) 96.2 - 95.2 %   Comment NOTIFIED PHYSICIAN    URINALYSIS, ROUTINE W REFLEX MICROSCOPIC     Status: Abnormal   Collection Time    11/13/12 10:52 PM      Result Value Range   Color, Urine YELLOW  YELLOW   APPearance CLOUDY (*) CLEAR   Specific Gravity, Urine 1.014  1.005 - 1.030   pH 6.0  5.0 - 8.0   Glucose, UA NEGATIVE  NEGATIVE mg/dL   Hgb urine dipstick LARGE (*) NEGATIVE   Bilirubin Urine NEGATIVE  NEGATIVE   Ketones, ur NEGATIVE  NEGATIVE mg/dL   Protein, ur NEGATIVE  NEGATIVE mg/dL   Urobilinogen, UA 0.2  0.0 - 1.0 mg/dL   Nitrite NEGATIVE  NEGATIVE   Leukocytes, UA MODERATE (*) NEGATIVE  URINE MICROSCOPIC-ADD ON     Status: None   Collection Time    11/13/12 10:52 PM      Result Value Range   Squamous Epithelial / LPF RARE  RARE   WBC, UA 7-10  <3 WBC/hpf   RBC / HPF 21-50  <3 RBC/hpf   Bacteria, UA RARE  RARE  MRSA PCR SCREENING     Status: None   Collection Time    11/14/12 12:54 AM      Result Value Range   MRSA by PCR NEGATIVE  NEGATIVE   Comment:            The GeneXpert MRSA Assay (FDA     approved for NASAL specimens     only), is one component of a     comprehensive MRSA colonization     surveillance program. It is not      intended to diagnose MRSA     infection nor to guide or     monitor treatment for     MRSA infections.  URINE CULTURE     Status: None  Collection Time    11/14/12  1:18 AM      Result Value Range   Specimen Description URINE, CATHETERIZED     Special Requests NONE     Culture  Setup Time 11/14/2012 05:48     Colony Count NO GROWTH     Culture NO GROWTH     Report Status 11/15/2012 FINAL    URINALYSIS, ROUTINE W REFLEX MICROSCOPIC     Status: Abnormal   Collection Time    11/14/12  1:18 AM      Result Value Range   Color, Urine YELLOW  YELLOW   APPearance CLOUDY (*) CLEAR   Specific Gravity, Urine 1.010  1.005 - 1.030   pH 5.5  5.0 - 8.0   Glucose, UA NEGATIVE  NEGATIVE mg/dL   Hgb urine dipstick LARGE (*) NEGATIVE   Bilirubin Urine NEGATIVE  NEGATIVE   Ketones, ur NEGATIVE  NEGATIVE mg/dL   Protein, ur 30 (*) NEGATIVE mg/dL   Urobilinogen, UA 0.2  0.0 - 1.0 mg/dL   Nitrite NEGATIVE  NEGATIVE   Leukocytes, UA MODERATE (*) NEGATIVE  URINE MICROSCOPIC-ADD ON     Status: None   Collection Time    11/14/12  1:18 AM      Result Value Range   Squamous Epithelial / LPF RARE  RARE   WBC, UA 21-50  <3 WBC/hpf   RBC / HPF TOO NUMEROUS TO COUNT  <3 RBC/hpf   Bacteria, UA RARE  RARE  BASIC METABOLIC PANEL     Status: Abnormal   Collection Time    11/14/12  8:00 AM      Result Value Range   Sodium 134 (*) 135 - 145 mEq/L   Comment: DELTA CHECK NOTED     REPEATED TO VERIFY   Potassium 5.2 (*) 3.5 - 5.1 mEq/L   Comment: DELTA CHECK NOTED     REPEATED TO VERIFY   Chloride 98  96 - 112 mEq/L   CO2 17 (*) 19 - 32 mEq/L   Glucose, Bld 91  Jordan - 99 mg/dL   BUN 161 (*) 6 - 23 mg/dL   Creatinine, Ser 0.96 (*) 0.50 - 1.35 mg/dL   Calcium 8.6  8.4 - 04.5 mg/dL   GFR calc non Af Amer 7 (*) >90 mL/min   GFR calc Af Amer 8 (*) >90 mL/min   Comment:            The eGFR has been calculated     using the CKD EPI equation.     This calculation has not been     validated in all clinical      situations.     eGFR's persistently     <90 mL/min signify     possible Chronic Kidney Disease.  CBC     Status: Abnormal   Collection Time    11/14/12  8:00 AM      Result Value Range   WBC 13.0 (*) 4.0 - 10.5 K/uL   RBC 3.20 (*) 4.22 - 5.81 MIL/uL   Hemoglobin 9.5 (*) 13.0 - 17.0 g/dL   Comment: DELTA CHECK NOTED     REPEATED TO VERIFY     DURING TRANSFUSION   HCT 26.8 (*) 39.0 - 52.0 %   MCV 83.8  78.0 - 100.0 fL   MCH 29.7  26.0 - 34.0 pg   MCHC 35.4  30.0 - 36.0 g/dL   RDW 40.9  81.1 - 91.4 %   Platelets 284  150 -  400 K/uL  PRO B NATRIURETIC PEPTIDE     Status: Abnormal   Collection Time    11/14/12  8:00 AM      Result Value Range   Pro B Natriuretic peptide (BNP) 1488.0 (*) 0 - 450 pg/mL  PROTIME-INR     Status: Abnormal   Collection Time    11/14/12  8:00 AM      Result Value Range   Prothrombin Time 19.1 (*) 11.6 - 15.2 seconds   INR 1.66 (*) 0.00 - 1.49  HEPATIC FUNCTION PANEL     Status: Abnormal   Collection Time    11/14/12  8:00 AM      Result Value Range   Total Protein 5.6 (*) 6.0 - 8.3 g/dL   Albumin 2.2 (*) 3.5 - 5.2 g/dL   AST 98 (*) 0 - 37 U/L   ALT 61 (*) 0 - 53 U/L   Alkaline Phosphatase 172 (*) 39 - 117 U/L   Total Bilirubin 1.1  0.3 - 1.2 mg/dL   Bilirubin, Direct 0.5 (*) 0.0 - 0.3 mg/dL   Indirect Bilirubin 0.6  0.3 - 0.9 mg/dL  URIC ACID     Status: Abnormal   Collection Time    11/14/12  8:00 AM      Result Value Range   Uric Acid, Serum 8.0 (*) 4.0 - 7.8 mg/dL  SODIUM, URINE, RANDOM     Status: None   Collection Time    11/14/12  9:35 AM      Result Value Range   Sodium, Ur 50    CREATININE, URINE, RANDOM     Status: None   Collection Time    11/14/12  9:35 AM      Result Value Range   Creatinine, Urine 52.3    HEMOGLOBIN AND HEMATOCRIT, BLOOD     Status: Abnormal   Collection Time    11/14/12 12:28 PM      Result Value Range   Hemoglobin 8.8 (*) 13.0 - 17.0 g/dL   HCT 91.4 (*) 78.2 - 95.6 %  HEMOGLOBIN AND HEMATOCRIT,  BLOOD     Status: Abnormal   Collection Time    11/14/12  6:22 PM      Result Value Range   Hemoglobin 8.8 (*) 13.0 - 17.0 g/dL   HCT 21.3 (*) 08.6 - 57.8 %  BASIC METABOLIC PANEL     Status: Abnormal   Collection Time    11/14/12  6:22 PM      Result Value Range   Sodium 136  135 - 145 mEq/L   Potassium 3.9  3.5 - 5.1 mEq/L   Comment: DELTA CHECK NOTED   Chloride 104  96 - 112 mEq/L   CO2 17 (*) 19 - 32 mEq/L   Glucose, Bld 95  Jordan - 99 mg/dL   BUN 86 (*) 6 - 23 mg/dL   Creatinine, Ser 4.69 (*) 0.50 - 1.35 mg/dL   Calcium 7.9 (*) 8.4 - 10.5 mg/dL   GFR calc non Af Amer 9 (*) >90 mL/min   GFR calc Af Amer 11 (*) >90 mL/min   Comment:            The eGFR has been calculated     using the CKD EPI equation.     This calculation has not been     validated in all clinical     situations.     eGFR's persistently     <90 mL/min signify  possible Chronic Kidney Disease.  BASIC METABOLIC PANEL     Status: Abnormal   Collection Time    11/15/12  3:35 AM      Result Value Range   Sodium 140  135 - 145 mEq/L   Potassium 3.4 (*) 3.5 - 5.1 mEq/L   Chloride 109  96 - 112 mEq/L   CO2 20  19 - 32 mEq/L   Glucose, Bld 107 (*) Jordan - 99 mg/dL   BUN 69 (*) 6 - 23 mg/dL   Creatinine, Ser 4.09 (*) 0.50 - 1.35 mg/dL   Calcium 8.0 (*) 8.4 - 10.5 mg/dL   GFR calc non Af Amer 12 (*) >90 mL/min   GFR calc Af Amer 14 (*) >90 mL/min   Comment:            The eGFR has been calculated     using the CKD EPI equation.     This calculation has not been     validated in all clinical     situations.     eGFR's persistently     <90 mL/min signify     possible Chronic Kidney Disease.  GLUCOSE, CAPILLARY     Status: Abnormal   Collection Time    11/15/12  8:21 AM      Result Value Range   Glucose-Capillary 101 (*) Jordan - 99 mg/dL   Comment 1 Documented in Chart     Comment 2 Notify RN    PROTIME-INR     Status: Abnormal   Collection Time    11/15/12  9:20 AM      Result Value Range    Prothrombin Time 17.0 (*) 11.6 - 15.2 seconds   INR 1.42  0.00 - 1.49  CBC     Status: Abnormal   Collection Time    11/15/12  9:20 AM      Result Value Range   WBC 6.1  4.0 - 10.5 K/uL   RBC 3.05 (*) 4.22 - 5.81 MIL/uL   Hemoglobin 8.9 (*) 13.0 - 17.0 g/dL   HCT 81.1 (*) 91.4 - 78.2 %   MCV 84.9  78.0 - 100.0 fL   MCH 29.2  26.0 - 34.0 pg   MCHC 34.4  30.0 - 36.0 g/dL   RDW 95.6  21.3 - 08.6 %   Platelets 220  150 - 400 K/uL   Recent Results (from the past 240 hour(s))  MRSA PCR SCREENING     Status: None   Collection Time    11/14/12 12:54 AM      Result Value Range Status   MRSA by PCR NEGATIVE  NEGATIVE Final   Comment:            The GeneXpert MRSA Assay (FDA     approved for NASAL specimens     only), is one component of a     comprehensive MRSA colonization     surveillance program. It is not     intended to diagnose MRSA     infection nor to guide or     monitor treatment for     MRSA infections.  URINE CULTURE     Status: None   Collection Time    11/14/12  1:18 AM      Result Value Range Status   Specimen Description URINE, CATHETERIZED   Final   Special Requests NONE   Final   Culture  Setup Time 11/14/2012 05:48   Final   Colony Count NO GROWTH  Final   Culture NO GROWTH   Final   Report Status 11/15/2012 FINAL   Final   Creatinine:  Recent Labs  11/13/12 2129 11/13/12 2217 11/14/12 0800 11/14/12 1822 11/15/12 0335  CREATININE 8.41* 8.60* 6.79* 5.11* 4.01*   Impression/Assessment:  BPH.  Renal insufficiency.   Plan:  Leave Foley indwelling till creatinine returns to baseline. Then voiding trial.  Continue tamsulosin 0.4 mgm daily HS. Will follow the patient with you.  Clorine Swing-HENRY 11/15/2012, 10:40 PM   CC: Dr Theda Belfast Dhungel

## 2012-11-16 ENCOUNTER — Encounter (HOSPITAL_COMMUNITY): Payer: Self-pay | Admitting: *Deleted

## 2012-11-16 ENCOUNTER — Inpatient Hospital Stay (HOSPITAL_COMMUNITY): Payer: Medicare Other

## 2012-11-16 ENCOUNTER — Encounter (HOSPITAL_COMMUNITY)
Admission: EM | Disposition: A | Discharge status: Home / Self Care | Payer: Self-pay | Source: Home / Self Care | Attending: Internal Medicine

## 2012-11-16 DIAGNOSIS — E876 Hypokalemia: Secondary | ICD-10-CM

## 2012-11-16 HISTORY — PX: ESOPHAGOGASTRODUODENOSCOPY: SHX5428

## 2012-11-16 LAB — BASIC METABOLIC PANEL
BUN: 38 mg/dL — ABNORMAL HIGH (ref 6–23)
Creatinine, Ser: 2.14 mg/dL — ABNORMAL HIGH (ref 0.50–1.35)
GFR calc Af Amer: 31 mL/min — ABNORMAL LOW (ref >90)
GFR calc non Af Amer: 26 mL/min — ABNORMAL LOW (ref >90)
Potassium: 3.3 mEq/L — ABNORMAL LOW (ref 3.5–5.1)

## 2012-11-16 LAB — MAGNESIUM: Magnesium: 1.6 mg/dL (ref 1.5–2.5)

## 2012-11-16 LAB — CBC
MCHC: 33.8 g/dL (ref 30.0–36.0)
RDW: 14.9 % (ref 11.5–15.5)
WBC: 5.5 10*3/uL (ref 4.0–10.5)

## 2012-11-16 SURGERY — EGD (ESOPHAGOGASTRODUODENOSCOPY)
Anesthesia: Moderate Sedation

## 2012-11-16 MED ORDER — MIDAZOLAM HCL 10 MG/2ML IJ SOLN
INTRAMUSCULAR | Status: DC | PRN
  Administered 2012-11-16: 1 mg via INTRAVENOUS
  Administered 2012-11-16: 2 mg via INTRAVENOUS

## 2012-11-16 MED ORDER — FENTANYL CITRATE 0.05 MG/ML IJ SOLN
INTRAMUSCULAR | Status: DC | PRN
  Administered 2012-11-16 (×2): 25 ug via INTRAVENOUS

## 2012-11-16 MED ORDER — BUTAMBEN-TETRACAINE-BENZOCAINE 2-2-14 % EX AERO
INHALATION_SPRAY | CUTANEOUS | Status: DC | PRN
  Administered 2012-11-16: 2 via TOPICAL

## 2012-11-16 NOTE — Progress Notes (Signed)
Patient had 8 beats Vtach at 2105. Patient was found lying in bed and asymptomatic. NP, T. Claiborne Billings, was paged. RN will continue to monitor HR and rhythm.

## 2012-11-16 NOTE — Evaluation (Signed)
Physical Therapy Evaluation Patient Details Name: [name] MRN: [id number] DOB: 01-Jan-1925 Today's Date: 11/16/2012 Time: 4098-1191 PT Time Calculation (min): 23 min  PT Assessment / Plan / Recommendation Clinical Impression  Pt is an 79 year old male with history of BPH, recent right hip revision surgery admitted for septic shock and anemia due to melanotic stool.  Pt would benefit from acute PT services in order to improve independence with transfers and ambulation and strengthen R LE to prepare for d/c back to SNF.      PT Assessment  Patient needs continued PT services    Follow Up Recommendations  SNF;Supervision/Assistance - 24 hour    Does the patient have the potential to tolerate intense rehabilitation      Barriers to Discharge        Equipment Recommendations  None recommended by PT    Recommendations for Other Services     Frequency Min 3X/week    Precautions / Restrictions Precautions Precautions: Posterior Hip;Fall Precaution Comments: pt unable to recall hip preacaution however once stated for him he says they sound familar Restrictions Weight Bearing Restrictions: Yes RLE Weight Bearing: Partial weight bearing RLE Partial Weight Bearing Percentage or Pounds: 50%   Pertinent Vitals/Pain Pt denies pain     Mobility  Bed Mobility Bed Mobility: Supine to Sit Supine to Sit: 2: Max assist Details for Bed Mobility Assistance: verbal cues for technique and precautions, required assist for lower body and upper body Transfers Transfers: Sit to Stand;Stand to Sit Sit to Stand: 3: Mod assist;With upper extremity assist;From bed Stand to Sit: 4: Min assist;With upper extremity assist;To chair/3-in-1 Details for Transfer Assistance: verbal cues for safe technique within precautions Ambulation/Gait Ambulation/Gait Assistance: 4: Min assist Ambulation Distance (Feet): 32 Feet Assistive device: Rolling walker Ambulation/Gait Assistance Details: verbal cues  for safety, technique and sequence, pt does well maintaining PWB status, assist for weakness Gait Pattern: Step-to pattern;Trunk flexed;Decreased stride length;Decreased step length - right;Antalgic Gait velocity: decreased    Exercises     PT Diagnosis: Abnormality of gait;Generalized weakness  PT Problem List: Decreased strength;Decreased range of motion;Decreased activity tolerance;Decreased mobility;Decreased balance;Decreased knowledge of use of DME;Decreased knowledge of precautions PT Treatment Interventions: DME instruction;Gait training;Functional mobility training;Therapeutic activities;Therapeutic exercise;Patient/family education;Balance training   PT Goals Acute Rehab PT Goals PT Goal Formulation: With patient Time For Goal Achievement: 11/23/12 Potential to Achieve Goals: Good Pt will go Supine/Side to Sit: with min assist PT Goal: Supine/Side to Sit - Progress: Goal set today Pt will go Sit to Supine/Side: with min assist PT Goal: Sit to Supine/Side - Progress: Goal set today Pt will go Sit to Stand: with supervision PT Goal: Sit to Stand - Progress: Goal set today Pt will Ambulate: 51 - 150 feet;with supervision;with least restrictive assistive device PT Goal: Ambulate - Progress: Revised due to lack of progress Pt will Perform Home Exercise Program: with supervision, verbal cues required/provided PT Goal: Perform Home Exercise Program - Progress: Goal set today  Visit Information  Last PT Received On: 11/16/12 Assistance Needed: +1    Subjective Data  Subjective: I'm disappointed.  (wants to eat (NPO) and not satisfied with ambulation today)   Prior Functioning  Home Living Lives With: Spouse Type of Home: House Home Access: Stairs to enter Entrance Stairs-Rails: Right;Left Home Layout: One level Bathroom Shower/Tub: Health visitor: Standard Home Adaptive Equipment: Grab bars in shower;Grab bars around toilet;Wheelchair - manual;Walker -  rolling;Bedside commode/3-in-1;Tub transfer bench;Straight cane Additional Comments: went home then back to  rehab after hip surgery and plans to return to SNF to finish rehab Prior Function Level of Independence: Independent with assistive device(s) Able to Take Stairs?: Yes Vocation: Retired Comments: pt states he was starting to ambulate with cane prior to admission Communication Communication: No difficulties    Cognition  Cognition Overall Cognitive Status: Appears within functional limits for tasks assessed/performed Arousal/Alertness: Awake/alert Orientation Level: Appears intact for tasks assessed Behavior During Session: [name] for tasks performed    Extremity/Trunk Assessment Right Lower Extremity Assessment RLE ROM/Strength/Tone: Deficits RLE ROM/Strength/Tone Deficits: presents with deficits after R THR surgery 3/11, assist required for mobility today Left Lower Extremity Assessment LLE ROM/Strength/Tone: [facility] for tasks assessed Trunk Assessment Trunk Assessment: Kyphotic   Balance    End of Session PT - End of Session Equipment Utilized During Treatment: Gait belt Activity Tolerance: Patient limited by fatigue Patient left: in chair;with call bell/phone within reach Nurse Communication: Mobility status;Precautions;Weight bearing status (nsg tech aware pt in chair, precautions and WB on board)  GP     Jerrica Thorman,KATHrine E 11/16/2012, 10:03 AM Zenovia Jarred, PT, DPT 11/16/2012 Pager: [phone]

## 2012-11-16 NOTE — H&P (View-Only) (Signed)
[facility] PROGRESS NOTE  [name] MRN:[id number] DOB: 12/22/1924 DOA: 11/13/2012 PCP: KAPLAN,DAVID MANN, MD  Brief narrative  79-year-old male with history of BPH, recent right hip revision surgery, mild CK D. with creatinine of 1.4 one week back, recent UTI treated with Bactrim was sent from the nursing home for confusion, melanotic stool with drop in H&H, hyperkalemia and acute kidney injury. Patient septic with melanotic stool with significant drop in H&H, hyperkalemia, acute kidney injury with uremic symptoms and metabolic acidosis.    Assessment/Plan:  Septic shock  Patient presented with leukocytosis, hypotension and tachycardia with melanotic stool and drop in H&H. Also had hyperkalemia, significant acute kidney injury with uremic encephalopathy and anion gap metabolic acidosis.  -Patient admitted to step down for closer monitoring. Received 2 units PRBC and his H&H  improved and  stable. A Foley was placed in draining bloody urine.  - Patient started on bicarbonate and renal function has been now improving. Acute kidney injury likely in the setting of obstructive uropathy with known history of BPH with associated dehydration.  -Avoid nephrotoxins.  - Bicarbonate improved and Switched  fluid to normal saline. Renal function improving. Will d/c fluids today  -Monitor serial H&H.  -Dr. hung up with a GI consulted and plan on EGD today . Was started on Protonix drip on 3/26   Acute on chronic kidney disease  As outlined above. Uremic symptoms have resolved. D/c  IV fluids. Monitor urine output and electrolytes. Renal ultrasound suggests bilateral hydronephrosis and very likely for bladder outlet obstruction. -Continue Flomax  -Appreciate renal recommendations.  -Recommend for repeat renal ultrasound which i have ordered. Seen by Dr Nesi, will follow his up as outpatient.  Anemia with GI bleeding  Continue to monitor serial H&H. Hemoglobin improved after 2 units PRBC.   -Continue Protonix  drip - Plan on EGD this afternoon .  Hyponatremia  Possibly in the setting of dehydration and now resolved   Hyperkalemia  Secondary to acute kidney injury and possibly recent Bactrim use. Given insulin with D50 and albuterol in the ED. Also given Kayexalate. Now improved   Leukocytosis  Urine culture negative.Treating empirically with Rocephin. Will d/c   Coagulopathy  Patient on pradaxa for DVT prophylaxis following recent total hip revision. Being held given high INR and melanotic stool.  Recent right total hip revision  called Dr Olin . Since he is more than 2 weeks from the procedure does not need further DVT prophylaxis. Will see him today and possibly remove staples  Code Status: Full code  Family Communication: None at bedside  Disposition Plan: back to SNF in 1-2 days if stable  Consultants:  Renal (Dr. Schertz)  GI ( Hung/ Mann)  Urology ( Dr Nesi)  Dr Olin   Procedures:  For EGD today Antibiotics:  IV Rocephin   HPI/Subjective: Feels better overall. No overnight issues  Objective: Filed Vitals:   11/15/12 1000 11/15/12 1202 11/15/12 2046 11/16/12 0500  BP: 123/60 131/57 123/51 127/60  Pulse: 95 97 79 84  Temp: 98.8 F (37.1 C) 98.5 F (36.9 C) 98.2 F (36.8 C) 98.5 F (36.9 C)  TempSrc:  Oral Oral Oral  Resp: 17 18 16 18  Height:      Weight:    69.3 kg (152 lb 12.5 oz)  SpO2: 99% 100% 100% 99%    Intake/Output Summary (Last 24 hours) at 11/16/12 1156 Last data filed at 11/16/12 0700  Gross per 24 hour  Intake 3301.25 ml  Output     1752 ml  Net 1549.25 ml   Filed Weights   11/14/12 0000 11/14/12 0500 11/16/12 0500  Weight: 65.9 kg (145 lb 4.5 oz) 69.1 kg (152 lb 5.4 oz) 69.3 kg (152 lb 12.5 oz)    Exam:  General: Elderly male lying in bed in no acute distress.   HEENT: No pallor, dry oral mucosa  Cardiovascular: S1 S2 normal, no murmurs rub or gallop  Respiratory: Clear to auscultation bilaterally  Abdomen: Soft,  nontender, nondistended, bowel sounds present, Foley draining clear  urine  Musculoskeletal: Warm, no edema some pain over right hip with dressing  CNS: Alert and awake, oriented x3   Data Reviewed: Basic Metabolic Panel:  Recent Labs Lab 11/13/12 2129 11/13/12 2217 11/14/12 0800 11/14/12 1822 11/15/12 0335 11/16/12 0534  NA 125* 126* 134* 136 140 140  K 6.5* 6.3* 5.2* 3.9 3.4* 3.3*  CL 90* 102 98 104 109 111  CO2 15*  --  17* 17* 20 18*  GLUCOSE 102* 96 91 95 107* 97  BUN 119* 140* 104* 86* 69* 38*  CREATININE 8.41* 8.60* 6.79* 5.11* 4.01* 2.14*  CALCIUM 8.6  --  8.6 7.9* 8.0* 7.9*   Liver Function Tests:  Recent Labs Lab 11/14/12 0800  AST 98*  ALT 61*  ALKPHOS 172*  BILITOT 1.1  PROT 5.6*  ALBUMIN 2.2*   No results found for this basename: LIPASE, AMYLASE,  in the last 168 hours No results found for this basename: AMMONIA,  in the last 168 hours CBC:  Recent Labs Lab 11/13/12 2129  11/14/12 0800 11/14/12 1228 11/14/12 1822 11/15/12 0920 11/16/12 0534  WBC 14.7*  --  13.0*  --   --  6.1 5.5  NEUTROABS 12.6*  --   --   --   --   --   --   HGB 7.7*  < > 9.5* 8.8* 8.8* 8.9* 9.0*  HCT 22.6*  < > 26.8* 25.2* 25.0* 25.9* 26.6*  MCV 85.0  --  83.8  --   --  84.9 86.1  PLT 332  --  284  --   --  220 211  < > = values in this interval not displayed. Cardiac Enzymes: No results found for this basename: CKTOTAL, CKMB, CKMBINDEX, TROPONINI,  in the last 168 hours BNP (last 3 results)  Recent Labs  11/14/12 0800  PROBNP 1488.0*   CBG:  Recent Labs Lab 11/15/12 0821  GLUCAP 101*    Recent Results (from the past 240 hour(s))  MRSA PCR SCREENING     Status: None   Collection Time    11/14/12 12:54 AM      Result Value Range Status   MRSA by PCR NEGATIVE  NEGATIVE Final   Comment:            The GeneXpert MRSA Assay (FDA     approved for NASAL specimens     only), is one component of a     comprehensive MRSA colonization     surveillance program.  It is not     intended to diagnose MRSA     infection nor to guide or     monitor treatment for     MRSA infections.  URINE CULTURE     Status: None   Collection Time    11/14/12  1:18 AM      Result Value Range Status   Specimen Description URINE, CATHETERIZED   Final   Special Requests NONE   Final   Culture    Setup Time 11/14/2012 05:48   Final   Colony Count NO GROWTH   Final   Culture NO GROWTH   Final   Report Status 11/15/2012 FINAL   Final     Studies: No results found.  Scheduled Meds: . allopurinol  100 mg Oral q morning - 10a  . cefTRIAXone (ROCEPHIN)  IV  1 g Intravenous QHS  . docusate sodium  100 mg Oral BID  . sodium chloride  3 mL Intravenous Q12H  . tamsulosin  0.4 mg Oral QPC supper   Continuous Infusions: . sodium chloride 100 mL/hr at 11/15/12 1908  . sodium chloride    . pantoprozole (PROTONIX) infusion 8 mg/hr (11/15/12 1817)      Time spent: 25 minutes    Eadie Repetto  [facility] Pager [phone] If 7PM-7AM, please contact night-coverage at [email or web address], password [email or web address] 11/16/2012, 11:56 AM  LOS: 3 days             

## 2012-11-16 NOTE — Progress Notes (Signed)
[facility] PROGRESS NOTE  [name] ZOX:[id number] DOB: 1924-08-26 DOA: 11/13/2012 PCP: Verneita Griffes, MD  Brief narrative  79 year old male with history of BPH, recent right hip revision surgery, mild CK D. with creatinine of 1.4 one week back, recent UTI treated with Bactrim was sent from the nursing home for confusion, melanotic stool with drop in H&H, hyperkalemia and acute kidney injury. Patient septic with melanotic stool with significant drop in H&H, hyperkalemia, acute kidney injury with uremic symptoms and metabolic acidosis.    Assessment/Plan:  Septic shock  Patient presented with leukocytosis, hypotension and tachycardia with melanotic stool and drop in H&H. Also had hyperkalemia, significant acute kidney injury with uremic encephalopathy and anion gap metabolic acidosis.  -Patient admitted to step down for closer monitoring. Received 2 units PRBC and his H&H  improved and  stable. A Foley was placed in draining bloody urine.  - Patient started on bicarbonate and renal function has been now improving. Acute kidney injury likely in the setting of obstructive uropathy with known history of BPH with associated dehydration.  -Avoid nephrotoxins.  - Bicarbonate improved and Switched  fluid to normal saline. Renal function improving. Will d/c fluids today  -Monitor serial H&H.  -Dr. hung up with a GI consulted and plan on EGD today . Was started on Protonix drip on 3/26   Acute on chronic kidney disease  As outlined above. Uremic symptoms have resolved. D/c  IV fluids. Monitor urine output and electrolytes. Renal ultrasound suggests bilateral hydronephrosis and very likely for bladder outlet obstruction. -Continue Flomax  -Appreciate renal recommendations.  -Recommend for repeat renal ultrasound which i have ordered. Seen by Dr Brunilda Payor, will follow his up as outpatient.  Anemia with GI bleeding  Continue to monitor serial H&H. Hemoglobin improved after 2 units PRBC.   -Continue Protonix  drip - Plan on EGD this afternoon .  Hyponatremia  Possibly in the setting of dehydration and now resolved   Hyperkalemia  Secondary to acute kidney injury and possibly recent Bactrim use. Given insulin with D50 and albuterol in the ED. Also given Kayexalate. Now improved   Leukocytosis  Urine culture negative.Treating empirically with Rocephin. Will d/c   Coagulopathy  Patient on pradaxa for DVT prophylaxis following recent total hip revision. Being held given high INR and melanotic stool.  Recent right total hip revision  called Dr Charlann Boxer . Since he is more than 2 weeks from the procedure does not need further DVT prophylaxis. Will see him today and possibly remove staples  Code Status: Full code  Family Communication: None at bedside  Disposition Plan: back to SNF in 1-2 days if stable  Consultants:  Renal (Dr. Arlean Hopping)  GI [facility])  Urology ( Dr Brunilda Payor)  Dr Charlann Boxer   Procedures:  For EGD today Antibiotics:  IV Rocephin   HPI/Subjective: Feels better overall. No overnight issues  Objective: Filed Vitals:   11/15/12 1000 11/15/12 1202 11/15/12 2046 11/16/12 0500  BP: 123/60 131/57 123/51 127/60  Pulse: 95 97 79 84  Temp: 98.8 F (37.1 C) 98.5 F (36.9 C) 98.2 F (36.8 C) 98.5 F (36.9 C)  TempSrc:  Oral Oral Oral  Resp: 17 18 16 18   Height:      Weight:    69.3 kg (152 lb 12.5 oz)  SpO2: 99% 100% 100% 99%    Intake/Output Summary (Last 24 hours) at 11/16/12 1156 Last data filed at 11/16/12 0700  Gross per 24 hour  Intake 3301.25 ml  Output  1752 ml  Net 1549.25 ml   Filed Weights   11/14/12 0000 11/14/12 0500 11/16/12 0500  Weight: 65.9 kg (145 lb 4.5 oz) 69.1 kg (152 lb 5.4 oz) 69.3 kg (152 lb 12.5 oz)    Exam:  General: Elderly male lying in bed in no acute distress.   HEENT: No pallor, dry oral mucosa  Cardiovascular: S1 S2 normal, no murmurs rub or gallop  Respiratory: Clear to auscultation bilaterally  Abdomen: Soft,  nontender, nondistended, bowel sounds present, Foley draining clear  urine  Musculoskeletal: Warm, no edema some pain over right hip with dressing  CNS: Alert and awake, oriented x3   Data Reviewed: Basic Metabolic Panel:  Recent Labs Lab 11/13/12 2129 11/13/12 2217 11/14/12 0800 11/14/12 1822 11/15/12 0335 11/16/12 0534  NA 125* 126* 134* 136 140 140  K 6.5* 6.3* 5.2* 3.9 3.4* 3.3*  CL 90* 102 98 104 109 111  CO2 15*  --  17* 17* 20 18*  GLUCOSE 102* 96 91 95 107* 97  BUN 119* 140* 104* 86* 69* 38*  CREATININE 8.41* 8.60* 6.79* 5.11* 4.01* 2.14*  CALCIUM 8.6  --  8.6 7.9* 8.0* 7.9*   Liver Function Tests:  Recent Labs Lab 11/14/12 0800  AST 98*  ALT 61*  ALKPHOS 172*  BILITOT 1.1  PROT 5.6*  ALBUMIN 2.2*   No results found for this basename: LIPASE, AMYLASE,  in the last 168 hours No results found for this basename: AMMONIA,  in the last 168 hours CBC:  Recent Labs Lab 11/13/12 2129  11/14/12 0800 11/14/12 1228 11/14/12 1822 11/15/12 0920 11/16/12 0534  WBC 14.7*  --  13.0*  --   --  6.1 5.5  NEUTROABS 12.6*  --   --   --   --   --   --   HGB 7.7*  < > 9.5* 8.8* 8.8* 8.9* 9.0*  HCT 22.6*  < > 26.8* 25.2* 25.0* 25.9* 26.6*  MCV 85.0  --  83.8  --   --  84.9 86.1  PLT 332  --  284  --   --  220 211  < > = values in this interval not displayed. Cardiac Enzymes: No results found for this basename: CKTOTAL, CKMB, CKMBINDEX, TROPONINI,  in the last 168 hours BNP (last 3 results)  Recent Labs  11/14/12 0800  PROBNP 1488.0*   CBG:  Recent Labs Lab 11/15/12 0821  GLUCAP 101*    Recent Results (from the past 240 hour(s))  MRSA PCR SCREENING     Status: None   Collection Time    11/14/12 12:54 AM      Result Value Range Status   MRSA by PCR NEGATIVE  NEGATIVE Final   Comment:            The GeneXpert MRSA Assay (FDA     approved for NASAL specimens     only), is one component of a     comprehensive MRSA colonization     surveillance program.  It is not     intended to diagnose MRSA     infection nor to guide or     monitor treatment for     MRSA infections.  URINE CULTURE     Status: None   Collection Time    11/14/12  1:18 AM      Result Value Range Status   Specimen Description URINE, CATHETERIZED   Final   Special Requests NONE   Final   Culture  Setup Time 11/14/2012 05:48   Final   Colony Count NO GROWTH   Final   Culture NO GROWTH   Final   Report Status 11/15/2012 FINAL   Final     Studies: No results found.  Scheduled Meds: . allopurinol  100 mg Oral q morning - 10a  . cefTRIAXone (ROCEPHIN)  IV  1 g Intravenous QHS  . docusate sodium  100 mg Oral BID  . sodium chloride  3 mL Intravenous Q12H  . tamsulosin  0.4 mg Oral QPC supper   Continuous Infusions: . sodium chloride 100 mL/hr at 11/15/12 1908  . sodium chloride    . pantoprozole (PROTONIX) infusion 8 mg/hr (11/15/12 1817)      Time spent: 25 minutes    Purva Vessell  [facility] Pager [phone] If 7PM-7AM, please contact night-coverage at [email or web address], password [email or web address] 11/16/2012, 11:56 AM  LOS: 3 days

## 2012-11-16 NOTE — Interval H&P Note (Signed)
History and Physical Interval Note:  11/16/2012 1:59 PM  [name]  has presented today for surgery, with the diagnosis of Melena  The various methods of treatment have been discussed with the patient and family. After consideration of risks, benefits and other options for treatment, the patient has consented to  Procedure(s): ESOPHAGOGASTRODUODENOSCOPY (EGD) (N/A) as a surgical intervention .  The patient's history has been reviewed, patient examined, no change in status, stable for surgery.  I have reviewed the patient's chart and labs.  Questions were answered to the patient's satisfaction.     Masato Pettie D

## 2012-11-16 NOTE — Progress Notes (Signed)
  Subjective: Patient reports Feels better today. Had a good supper tonight  Objective: Vital signs in last 24 hours: Temp:  [98.2 F (36.8 C)-99.6 F (37.6 C)] 99.6 F (37.6 C) (03/28 1430) Pulse Rate:  [79-92] 92 (03/28 1327) Resp:  [15-21] 21 (03/28 1458) BP: (100-148)/(51-81) 139/81 mmHg (03/28 1458) SpO2:  [98 %-100 %] 99 % (03/28 1458) Weight:  [152 lb 12.5 oz (69.3 kg)] 152 lb 12.5 oz (69.3 kg) (03/28 0500)  Intake/Output from previous day: 03/27 0701 - 03/28 0700 In: 3770 [P.O.:720; I.V.:3000; IV Piggyback:50] Out: 2353 [Urine:2350; Stool:3] Intake/Output this shift: Total I/O In: 120 [P.O.:120] Out: 600 [Urine:600]  Physical Exam:  Alert and oriented Lungs - Normal respiratory effort, chest expands symmetrically.  Abdomen - Soft, non-tender & non-distended Foley draining clear urine BUN and Creatinine continue to improve.  Lab Results:  Recent Labs  11/14/12 1822 11/15/12 0920 11/16/12 0534  HGB 8.8* 8.9* 9.0*  HCT 25.0* 25.9* 26.6*   BMET  Recent Labs  11/15/12 0335 11/16/12 0534  NA 140 140  K 3.4* 3.3*  CL 109 111  CO2 20 18*  GLUCOSE 107* 97  BUN 69* 38*  CREATININE 4.01* 2.14*  CALCIUM 8.0* 7.9*    Recent Labs  11/13/12 2129 11/14/12 0800 11/15/12 0920  INR 2.48* 1.66* 1.42   No results found for this basename: LABURIN,  in the last 72 hours Results for orders placed during the hospital encounter of 11/13/12  MRSA PCR SCREENING     Status: None   Collection Time    11/14/12 12:54 AM      Result Value Range Status   MRSA by PCR NEGATIVE  NEGATIVE Final   Comment:            The GeneXpert MRSA Assay (FDA     approved for NASAL specimens     only), is one component of a     comprehensive MRSA colonization     surveillance program. It is not     intended to diagnose MRSA     infection nor to guide or     monitor treatment for     MRSA infections.  URINE CULTURE     Status: None   Collection Time    11/14/12  1:18 AM   Result Value Range Status   Specimen Description URINE, CATHETERIZED   Final   Special Requests NONE   Final   Culture  Setup Time 11/14/2012 05:48   Final   Colony Count NO GROWTH   Final   Culture NO GROWTH   Final   Report Status 11/15/2012 FINAL   Final    Studies/Results: No results found.  Assessment/Plan:  Renal insufficiency.  BPH  Leave Foley indwelling.   LOS: 3 days   [name] 11/16/2012, 6:37 PM

## 2012-11-16 NOTE — Op Note (Signed)
[facility] [address] [place], [address]   OPERATIVE PROCEDURE REPORT  PATIENT: [name], [name]  MR#: [id number] BIRTHDATE: Jun 06, 1925  GENDER: Male ENDOSCOPIST: Jeani Hawking, MD ASSISTANT:   Kandice Robinsons, technician and Felecia Shelling, RN PROCEDURE DATE: 11/16/2012 PROCEDURE:   EGD, diagnostic ASA CLASS:   Class III INDICATIONS:Anemia and Melena. MEDICATIONS: Versed 3 mg IV and Fentanyl 50 mcg IV TOPICAL ANESTHETIC:   Cetacaine Spray  DESCRIPTION OF PROCEDURE:   After the risks benefits and alternatives of the procedure were thoroughly explained, informed consent was obtained.  The     endoscope was introduced through the mouth  and advanced to the second portion of the duodenum Without limitations.      The instrument was slowly withdrawn as the mucosa was fully examined.      ESOPHAGUS: A very mild healing esohpagitis was noted in the distal esophagus.  There was some hematin on some of the healing portions of the inflammation.  A nonobstructive Schatzki's ring was also identified in the setting of a 2 cm hiatal hernia.  No other abnormalities were noted in the upper GI tract.   Retroflexed views revealed no abnormalities.     The scope was then withdrawn from the patient and the procedure terminated.  COMPLICATIONS: There were no complications. IMPRESSION: 1) Healing mild esophagitis. 2) Schatzki's ring. 3) Hiatal hernia.  RECOMMENDATIONS:1) Continue with Protonix. 2) Avoid anticoagulation if possible.   _______________________________ eSigned:  Jeani Hawking, MD 11/16/2012 2:29 PM

## 2012-11-17 DIAGNOSIS — N139 Obstructive and reflux uropathy, unspecified: Secondary | ICD-10-CM | POA: Diagnosis present

## 2012-11-17 LAB — BASIC METABOLIC PANEL
GFR calc Af Amer: 35 mL/min — ABNORMAL LOW (ref >90)
GFR calc non Af Amer: 31 mL/min — ABNORMAL LOW (ref >90)
Potassium: 3.2 mEq/L — ABNORMAL LOW (ref 3.5–5.1)
Sodium: 140 mEq/L (ref 135–145)

## 2012-11-17 LAB — CBC
Hemoglobin: 8.9 g/dL — ABNORMAL LOW (ref 13.0–17.0)
RBC: 3.11 MIL/uL — ABNORMAL LOW (ref 4.22–5.81)
WBC: 8.5 10*3/uL (ref 4.0–10.5)

## 2012-11-17 MED ORDER — SODIUM CHLORIDE 0.45 % IV SOLN
INTRAVENOUS | Status: AC
  Administered 2012-11-17: 13:00:00 via INTRAVENOUS
  Filled 2012-11-17: qty 1000

## 2012-11-17 MED ORDER — POTASSIUM CHLORIDE CRYS ER 20 MEQ PO TBCR
40.0000 meq | EXTENDED_RELEASE_TABLET | Freq: Once | ORAL | Status: AC
  Administered 2012-11-17: 40 meq via ORAL
  Filled 2012-11-17: qty 2

## 2012-11-17 MED ORDER — PANTOPRAZOLE SODIUM 40 MG PO TBEC
40.0000 mg | DELAYED_RELEASE_TABLET | Freq: Two times a day (BID) | ORAL | Status: DC
  Administered 2012-11-17 – 2012-11-19 (×5): 40 mg via ORAL
  Filled 2012-11-17 (×9): qty 1

## 2012-11-17 MED ORDER — MAGNESIUM SULFATE 40 MG/ML IJ SOLN
2.0000 g | Freq: Once | INTRAMUSCULAR | Status: AC
  Administered 2012-11-17: 2 g via INTRAVENOUS
  Filled 2012-11-17: qty 50

## 2012-11-17 NOTE — Progress Notes (Signed)
[facility] PROGRESS NOTE  [name] ZOX:[id number] DOB: Mar 14, 1925 DOA: 11/13/2012 PCP: Verneita Griffes, MD  Brief narrative  79 year old male with history of BPH, recent right hip revision surgery, mild CK D. with creatinine of 1.4 one week back, recent UTI treated with Bactrim was sent from the nursing home for confusion, melanotic stool with drop in H&H, hyperkalemia and acute kidney injury. Patient septic with melanotic stool with significant drop in H&H, hyperkalemia, acute kidney injury with uremic symptoms and metabolic acidosis.   Assessment/Plan:  Septic shock  Patient presented with leukocytosis, hypotension and tachycardia with melanotic stool and drop in H&H. Also had hyperkalemia, significant acute kidney injury with uremic encephalopathy and anion gap metabolic acidosis.  -Patient admitted to step down for closer monitoring. Received 2 units PRBC and his H&H improved and stable. A Foley was placed. - Patient started on bicarbonate drip and renal function started to improve. Acute kidney injury likely in the setting of obstructive uropathy with known history of BPH with associated dehydration.  -Avoid nephrotoxins.  - Bicarbonate improved and Switched fluid to normal saline. Renal function improving and closer to baseline.  -off fluids  Acute on chronic kidney disease  As outlined above. Uremic symptoms have resolved. Off fluids. . Monitor urine output and electrolytes. Renal ultrasound suggests bilateral hydronephrosis and very likely for bladder outlet obstruction. -Continue Flomax  -Appreciate renal recommendations.  -Recommend for repeat renal ultrasound done shows worsening rt hydronephrosis but improved on eft. His voerall renal fn has improved. . Seen by Dr Brunilda Payor recommends to continue foley for now. Likely go home with foley with outpt follow up with him.   Anemia with GI bleeding   Hemoglobin improved after 2 units PRBC.  -was placed on protonix drip  switched to bid PPI - EGD done on 3/28 shows healing mild esophagitis.  Recent rt hip revision with coagulopathy Patient on pradaxa for DVT prophylaxis following recent total hip revision. dced given high INR and melanotic stool.  Recent right total hip revision  . Since he is more than 2 weeks from the procedure Dr Charlann Boxer recommended  That he does not need further DVT prophylaxis.  Marland Kitchen  staples removed by ortho today. Patient and family wish to take him home. Will ask PT to reassess and if safe to go home  will dc home tomorrow with home health.   Hyponatremia  Possibly in the setting of dehydration and now resolved   Hyperkalemia  Secondary to acute kidney injury and possibly recent Bactrim use. Now resolved     Code Status: Full code  Family Communication: wife and daughter  at bedside  Disposition Plan: d/c home tomorrow with HHPT if stable  Consultants:  Renal (Dr. Arlean Hopping)  GI [facility])  Urology ( Dr Brunilda Payor)  Dr Charlann Boxer   Procedures:  EGD on 3/28   Antibiotics:  IV Rocephin ( 3/26-3/28)   HPI/Subjective:  Feels better overall. Had 8 runs of NSVT. Low mg and k replenished No other overnight issues   Objective: Filed Vitals:   11/16/12 1458 11/16/12 2124 11/17/12 0500 11/17/12 0529  BP: 139/81 108/46  138/50  Pulse:  85  86  Temp:  98.5 F (36.9 C)  98.3 F (36.8 C)  TempSrc:  Oral  Oral  Resp: 21 16  18   Height:      Weight:   69 kg (152 lb 1.9 oz)   SpO2: 99% 98%  100%    Intake/Output Summary (Last 24 hours) at 11/17/12  1137 Last data filed at 11/17/12 0836  Gross per 24 hour  Intake   1880 ml  Output   1302 ml  Net    578 ml   Filed Weights   11/14/12 0500 11/16/12 0500 11/17/12 0500  Weight: 69.1 kg (152 lb 5.4 oz) 69.3 kg (152 lb 12.5 oz) 69 kg (152 lb 1.9 oz)    Exam:  General: Elderly male lying in bed in no acute distress.  HEENT: No pallor, dry oral mucosa  Cardiovascular: S1 S2 normal, no murmurs rub or gallop  Respiratory: Clear to  auscultation bilaterally  Abdomen: Soft, nontender, nondistended, bowel sounds present, Foley draining clear urine  Musculoskeletal: Warm, no edema , rt hip staples removed CNS: Alert and awake, oriented x3   Data Reviewed: Basic Metabolic Panel:  Recent Labs Lab 11/14/12 0800 11/14/12 1822 11/15/12 0335 11/16/12 0534 11/16/12 2218 11/17/12 0510  NA 134* 136 140 140  --  140  K 5.2* 3.9 3.4* 3.3*  --  3.2*  CL 98 104 109 111  --  112  CO2 17* 17* 20 18*  --  18*  GLUCOSE 91 95 107* 97  --  133*  BUN 104* 86* 69* 38*  --  29*  CREATININE 6.79* 5.11* 4.01* 2.14*  --  1.90*  CALCIUM 8.6 7.9* 8.0* 7.9*  --  7.8*  MG  --   --   --   --  1.6  --    Liver Function Tests:  Recent Labs Lab 11/14/12 0800  AST 98*  ALT 61*  ALKPHOS 172*  BILITOT 1.1  PROT 5.6*  ALBUMIN 2.2*   No results found for this basename: LIPASE, AMYLASE,  in the last 168 hours No results found for this basename: AMMONIA,  in the last 168 hours CBC:  Recent Labs Lab 11/13/12 2129  11/14/12 0800 11/14/12 1228 11/14/12 1822 11/15/12 0920 11/16/12 0534 11/17/12 0510  WBC 14.7*  --  13.0*  --   --  6.1 5.5 8.5  NEUTROABS 12.6*  --   --   --   --   --   --   --   HGB 7.7*  < > 9.5* 8.8* 8.8* 8.9* 9.0* 8.9*  HCT 22.6*  < > 26.8* 25.2* 25.0* 25.9* 26.6* 27.5*  MCV 85.0  --  83.8  --   --  84.9 86.1 88.4  PLT 332  --  284  --   --  220 211 220  < > = values in this interval not displayed. Cardiac Enzymes: No results found for this basename: CKTOTAL, CKMB, CKMBINDEX, TROPONINI,  in the last 168 hours BNP (last 3 results)  Recent Labs  11/14/12 0800  PROBNP 1488.0*   CBG:  Recent Labs Lab 11/15/12 0821  GLUCAP 101*    Recent Results (from the past 240 hour(s))  MRSA PCR SCREENING     Status: None   Collection Time    11/14/12 12:54 AM      Result Value Range Status   MRSA by PCR NEGATIVE  NEGATIVE Final   Comment:            The GeneXpert MRSA Assay (FDA     approved for NASAL  specimens     only), is one component of a     comprehensive MRSA colonization     surveillance program. It is not     intended to diagnose MRSA     infection nor to guide or  monitor treatment for     MRSA infections.  URINE CULTURE     Status: None   Collection Time    11/14/12  1:18 AM      Result Value Range Status   Specimen Description URINE, CATHETERIZED   Final   Special Requests NONE   Final   Culture  Setup Time 11/14/2012 05:48   Final   Colony Count NO GROWTH   Final   Culture NO GROWTH   Final   Report Status 11/15/2012 FINAL   Final     Studies: US Renal  11/16/2012  *RADIOLOGY REPORT*  Clinical Data: Follow-up hydronephrosis.  RENAL/URINARY TRACT ULTRASOUND COMPLETE  Comparison:  11/14/2012  Findings:  Right Kidney:  10.7 cm.  2.6 cm right upper pole cyst.  Right hydronephrosis has increased since prior study.  Left Kidney:  11.2 cm.  Very limited visualization.  No definite hydronephrosis.  2.8 cm lower pole hypoechoic area, likely cysts.  Bladder:  Foley catheter in place, decompressed.  IMPRESSION: Limited visualization of the kidneys.  There appears to be worsening right hydronephrosis and improving left hydronephrosis.   Original Report Authenticated By: Charlett Nose, M.D.     Scheduled Meds: . allopurinol  100 mg Oral q morning - 10a  . docusate sodium  100 mg Oral BID  . pantoprazole  40 mg Oral BID  . sodium chloride  3 mL Intravenous Q12H  . tamsulosin  0.4 mg Oral QPC supper   Continuous Infusions:      Time spent: 25 minutes    Tiffinie Caillier  [facility] Pager [phone] If 7PM-7AM, please contact night-coverage at [email or web address], password [facility] 11/17/2012, 11:37 AM  LOS: 4 days

## 2012-11-17 NOTE — Progress Notes (Signed)
  Subjective: Patient reports Feels better.  No pain Alert and oriented.  Objective: Vital signs in last 24 hours: Temp:  [98.3 F (36.8 C)-99.6 F (37.6 C)] 98.3 F (36.8 C) (03/29 0529) Pulse Rate:  [85-92] 86 (03/29 0529) Resp:  [15-21] 18 (03/29 0529) BP: (100-148)/(46-81) 138/50 mmHg (03/29 0529) SpO2:  [98 %-100 %] 100 % (03/29 0529) Weight:  [152 lb 1.9 oz (69 kg)] 152 lb 1.9 oz (69 kg) (03/29 0500)  Intake/Output from previous day: 03/28 0701 - 03/29 0700 In: 1880 [P.O.:330; I.V.:1500; IV Piggyback:50] Out: 1301 [Urine:1300; Stool:1] Intake/Output this shift: Total I/O In: -  Out: 1 [Stool:1]  Physical Exam:  Lungs - Normal respiratory effort, chest expands symmetrically.  Abdomen - Soft, non-tender & non-distended Foley draining clear urine Creatinine down to 1.9 Lab Results:  Recent Labs  11/15/12 0920 11/16/12 0534 11/17/12 0510  HGB 8.9* 9.0* 8.9*  HCT 25.9* 26.6* 27.5*   BMET  Recent Labs  11/16/12 0534 11/17/12 0510  NA 140 140  K 3.3* 3.2*  CL 111 112  CO2 18* 18*  GLUCOSE 97 133*  BUN 38* 29*  CREATININE 2.14* 1.90*  CALCIUM 7.9* 7.8*    Recent Labs  11/15/12 0920  INR 1.42   No results found for this basename: LABURIN,  in the last 72 hours Results for orders placed during the hospital encounter of 11/13/12  MRSA PCR SCREENING     Status: None   Collection Time    11/14/12 12:54 AM      Result Value Range Status   MRSA by PCR NEGATIVE  NEGATIVE Final   Comment:            The GeneXpert MRSA Assay (FDA     approved for NASAL specimens     only), is one component of a     comprehensive MRSA colonization     surveillance program. It is not     intended to diagnose MRSA     infection nor to guide or     monitor treatment for     MRSA infections.  URINE CULTURE     Status: None   Collection Time    11/14/12  1:18 AM      Result Value Range Status   Specimen Description URINE, CATHETERIZED   Final   Special Requests NONE    Final   Culture  Setup Time 11/14/2012 05:48   Final   Colony Count NO GROWTH   Final   Culture NO GROWTH   Final   Report Status 11/15/2012 FINAL   Final    Studies/Results: US Renal  11/16/2012  *RADIOLOGY REPORT*  Clinical Data: Follow-up hydronephrosis.  RENAL/URINARY TRACT ULTRASOUND COMPLETE  Comparison:  11/14/2012  Findings:  Right Kidney:  10.7 cm.  2.6 cm right upper pole cyst.  Right hydronephrosis has increased since prior study.  Left Kidney:  11.2 cm.  Very limited visualization.  No definite hydronephrosis.  2.8 cm lower pole hypoechoic area, likely cysts.  Bladder:  Foley catheter in place, decompressed.  IMPRESSION: Limited visualization of the kidneys.  There appears to be worsening right hydronephrosis and improving left hydronephrosis.   Original Report Authenticated By: Charlett Nose, M.D.     Assessment/Plan:  BPH  Renal insufficiency  Leave Foley indwelling  Continue tamsulsoin  Voiding trial when stable   LOS: 4 days   [name] 11/17/2012, 11:59 AM

## 2012-11-17 NOTE — Progress Notes (Signed)
[name]  MRN: [id number] DOB/Age: [birth year] 79 y.o. Physician: Jacquelyne Balint Procedure: Procedure(s) (LRB): ESOPHAGOGASTRODUODENOSCOPY (EGD) (N/A)     Subjective: No major c/o of hip  Vital Signs Temp:  [98.3 F (36.8 C)-99.6 F (37.6 C)] 98.3 F (36.8 C) (03/29 0529) Pulse Rate:  [85-92] 86 (03/29 0529) Resp:  [15-21] 18 (03/29 0529) BP: (100-148)/(46-81) 138/50 mmHg (03/29 0529) SpO2:  [98 %-100 %] 100 % (03/29 0529) Weight:  [69 kg (152 lb 1.9 oz)] 69 kg (152 lb 1.9 oz) (03/29 0500)  Lab Results  Recent Labs  11/16/12 0534 11/17/12 0510  WBC 5.5 8.5  HGB 9.0* 8.9*  HCT 26.6* 27.5*  PLT 211 220   BMET  Recent Labs  11/16/12 0534 11/17/12 0510  NA 140 140  K 3.3* 3.2*  CL 111 112  CO2 18* 18*  GLUCOSE 97 133*  BUN 38* 29*  CREATININE 2.14* 1.90*  CALCIUM 7.9* 7.8*   INR  Date Value Range Status  11/15/2012 1.42  0.00 - 1.49 Final     Exam Right hip incision healed with retained staples. These were removed without difficulty        Plan Per medicine. Family with questions about hip and disposition will pass along to Dr. Charlann Boxer, [facility] to address monday  [facility] for Dr.Kevin Supple 11/17/2012, 9:31 AM

## 2012-11-17 NOTE — Progress Notes (Signed)
Physical Therapy Treatment Patient Details Name: [name] MRN: [id number] DOB: 1925-05-23 Today's Date: 11/17/2012 Time: 7829-5621 PT Time Calculation (min): 22 min  PT Assessment / Plan / Recommendation Comments on Treatment Session  Pt ambulated in hallway and demonstrated a couple exercises for pt to attempt later/tomorrrow.  Reviewed hip precautions verbally, during mobility, and with exercises.  Spoke with spouse in room and daughter on phone about d/c plan and they would like to have CIR consult.   If pt not accepted by CIR then pt will need 24/7 assist if d/c home.    Follow Up Recommendations  CIR     Does the patient have the potential to tolerate intense rehabilitation     Barriers to Discharge        Equipment Recommendations  None recommended by PT    Recommendations for Other Services    Frequency     Plan Frequency remains appropriate;Discharge plan needs to be updated    Precautions / Restrictions Precautions Precautions: Posterior Hip;Fall Precaution Comments: pt able to recall 2/3 hip precautions Restrictions Weight Bearing Restrictions: Yes RLE Weight Bearing: Partial weight bearing RLE Partial Weight Bearing Percentage or Pounds: 50%   Pertinent Vitals/Pain 98% room air upon return to recliner after ambulation, no pain    Mobility  Bed Mobility Bed Mobility: Supine to Sit Supine to Sit: 4: Min assist Details for Bed Mobility Assistance: verbal cues for technique, slight assist for trunk upright Transfers Transfers: Stand to Sit;Sit to Stand Sit to Stand: 4: Min assist;With upper extremity assist;From bed Stand to Sit: To chair/3-in-1;4: Min guard;With upper extremity assist Details for Transfer Assistance: verbal cues for safe technique within precautions Ambulation/Gait Ambulation/Gait Assistance: 4: Min guard Ambulation Distance (Feet): 70 Feet Assistive device: Rolling walker Ambulation/Gait Assistance Details: verbal cues for safety, RW  distance, step length, PWB, initiated gait with step to pattern however switched to step through pattern upon returning to room Gait Pattern: Step-to pattern;Step-through pattern;Antalgic;Decreased stride length    Exercises General Exercises - Lower Extremity Ankle Circles/Pumps: AROM;Both;20 reps Quad Sets: AROM;Right;5 reps;Supine Heel Slides: AAROM;Right;5 reps;Supine;Other (comment) (within precautions) Hip ABduction/ADduction: AROM;Right;5 reps;Supine   PT Diagnosis:    PT Problem List:   PT Treatment Interventions:     PT Goals Acute Rehab PT Goals Pt will go Supine/Side to Sit: with modified independence PT Goal: Supine/Side to Sit - Progress: Updated due to goal met Pt will go Sit to Supine/Side: with modified independence PT Goal: Sit to Supine/Side - Progress: Goal set today Pt will go Sit to Stand: with supervision PT Goal: Sit to Stand - Progress: Progressing toward goal Pt will Ambulate: 51 - 150 feet;with supervision;with least restrictive assistive device PT Goal: Ambulate - Progress: Progressing toward goal Pt will Perform Home Exercise Program: with supervision, verbal cues required/provided PT Goal: Perform Home Exercise Program - Progress: Progressing toward goal  Visit Information  Last PT Received On: 11/17/12 Assistance Needed: +1    Subjective Data  Subjective: very agreeable to ambulate   Cognition  Cognition Overall Cognitive Status: Appears within functional limits for tasks assessed/performed Arousal/Alertness: Awake/alert Orientation Level: Appears intact for tasks assessed Behavior During Session: [facility] for tasks performed    Balance     End of Session PT - End of Session Activity Tolerance: Patient limited by fatigue Patient left: in chair;with call bell/phone within reach;with family/visitor present   GP     Damita Eppard,KATHrine E 11/17/2012, 4:28 PM Zenovia Jarred, PT, DPT 11/17/2012 Pager: [phone]

## 2012-11-17 NOTE — Progress Notes (Signed)
CCM called w/a run of Afib and rate of 140, rate back at 85 now, pt up in chair, asymptomatic. Will continue to monitor.

## 2012-11-17 NOTE — Progress Notes (Signed)
Just FYI to ortho, staples are still in Rt lateral knee, not sure if you overlooked or meant to leave them in when removing them from hip.

## 2012-11-18 LAB — BASIC METABOLIC PANEL
CO2: 20 mEq/L (ref 19–32)
Calcium: 7.9 mg/dL — ABNORMAL LOW (ref 8.4–10.5)
Chloride: 111 mEq/L (ref 96–112)
Potassium: 3.9 mEq/L (ref 3.5–5.1)
Sodium: 139 mEq/L (ref 135–145)

## 2012-11-18 LAB — CBC
HCT: 29.4 % — ABNORMAL LOW (ref 39.0–52.0)
MCV: 88 fL (ref 78.0–100.0)
Platelets: 213 10*3/uL (ref 150–400)
RBC: 3.34 MIL/uL — ABNORMAL LOW (ref 4.22–5.81)
WBC: 9.3 10*3/uL (ref 4.0–10.5)

## 2012-11-18 MED FILL — Diphenhydramine HCl Inj 50 MG/ML: INTRAMUSCULAR | Qty: 1 | Status: AC

## 2012-11-18 NOTE — Progress Notes (Signed)
[facility] PROGRESS NOTE  CHALMER ZHENG ZOX:[id number] DOB: [birth year] DOA: 11/13/2012 PCP: Verneita Griffes, MD  Brief narrative  79 year old male with history of BPH, recent right hip revision surgery, mild CK D. with creatinine of 1.4 one week back, recent UTI treated with Bactrim was sent from the nursing home for confusion, melanotic stool with drop in H&H, hyperkalemia and acute kidney injury. Patient septic with melanotic stool with significant drop in H&H, hyperkalemia, acute kidney injury with uremic symptoms and metabolic acidosis.   Assessment/Plan:  Septic shock  Patient presented with leukocytosis, hypotension and tachycardia with melanotic stool and drop in H&H. Also had hyperkalemia, significant acute kidney injury with uremic encephalopathy and anion gap metabolic acidosis.  -Patient admitted to step down for closer monitoring. Received 2 units PRBC and his H&H improved and stable. A Foley was placed.  - Patient started on bicarbonate drip and renal function started to improve. Acute kidney injury likely in the setting of obstructive uropathy with known history of BPH with associated dehydration.  -Avoiding nephrotoxins.  - Bicarbonate improved and Switched fluid to normal saline. Renal function improving and closer to baseline.   Acute on chronic kidney disease  As outlined above. Uremic symptoms have resolved. Off fluids. . has good urine output. Renal ultrasound suggests bilateral hydronephrosis and very likely for bladder outlet obstruction. -Continue Flomax  -Appreciate renal recommendations.  -repeat renal ultrasound done shows worsening rt hydronephrosis but improved on eft. His voerall renal fn has improved. . - -being followed by Dr. Brunilda Payor. Recommends voiding trial.  Anemia with GI bleeding  Hemoglobin improved after 2 units PRBC.  -was placed on protonix drip switched to bid PPI  - EGD done on 3/28 shows healing mild esophagitis.   Recent rt hip revision  with coagulopathy  Patient on pradaxa for DVT prophylaxis following recent total hip revision. dced given high INR and melanotic stool.  Recent right total hip revision  . Since he is more than 2 weeks from the procedure Dr Charlann Boxer recommended that he does not need further DVT prophylaxis.  Marland Kitchen staples removed by ortho . Patient and family initially wished to take him home. Was reassessed by PT and recommended for CIR consult which has been placed.   Hyponatremia  Possibly in the setting of dehydration and now resolved   Hyperkalemia  Secondary to acute kidney injury and possibly recent Bactrim use. Now resolved   Code Status: Full code  Family Communication: wife and daughter at bedside  Disposition Plan: d/c home tomorrow with HHPT if stable  Consultants:  Renal (Dr. Arlean Hopping)  GI [facility])  Urology ( Dr Brunilda Payor)  Dr Charlann Boxer Procedures:  EGD on 3/28 Antibiotics:  IV Rocephin ( 3/26-3/28)  HPI/Subjective: Patient seen and examined this morning. Denies any symptoms.   Objective: Filed Vitals:   11/17/12 1447 11/17/12 2210 11/18/12 0516 11/18/12 1252  BP: 128/56 145/59 147/71 138/63  Pulse: 88 88 89 91  Temp: 98.5 F (36.9 C) 98.3 F (36.8 C) 98.2 F (36.8 C) 98 F (36.7 C)  TempSrc: Oral Oral Oral Axillary  Resp: 20 20 18 18   Height:      Weight:   67.4 kg (148 lb 9.4 oz)   SpO2: 99% 100% 100% 100%    Intake/Output Summary (Last 24 hours) at 11/18/12 1655 Last data filed at 11/18/12 1300  Gross per 24 hour  Intake   1125 ml  Output   1950 ml  Net   -825 ml   Filed  Weights   11/16/12 0500 11/17/12 0500 11/18/12 0516  Weight: 69.3 kg (152 lb 12.5 oz) 69 kg (152 lb 1.9 oz) 67.4 kg (148 lb 9.4 oz)    Exam:  General: Elderly male lying in bed in no acute distress.  HEENT: No pallor, dry oral mucosa  Cardiovascular: S1 S2 normal, no murmurs rub or gallop  Respiratory: Clear to auscultation bilaterally  Abdomen: Soft, nontender, nondistended, bowel sounds present,  Foley draining clear urine  Musculoskeletal: Warm, no edema , rt hip staples removed  CNS: Alert and awake, oriented x3   Data Reviewed: Basic Metabolic Panel:  Recent Labs Lab 11/14/12 1822 11/15/12 0335 11/16/12 0534 11/16/12 2218 11/17/12 0510 11/18/12 0447  NA 136 140 140  --  140 139  K 3.9 3.4* 3.3*  --  3.2* 3.9  CL 104 109 111  --  112 111  CO2 17* 20 18*  --  18* 20  GLUCOSE 95 107* 97  --  133* 121*  BUN 86* 69* 38*  --  29* 19  CREATININE 5.11* 4.01* 2.14*  --  1.90* 1.45*  CALCIUM 7.9* 8.0* 7.9*  --  7.8* 7.9*  MG  --   --   --  1.6  --   --    Liver Function Tests:  Recent Labs Lab 11/14/12 0800  AST 98*  ALT 61*  ALKPHOS 172*  BILITOT 1.1  PROT 5.6*  ALBUMIN 2.2*   No results found for this basename: LIPASE, AMYLASE,  in the last 168 hours No results found for this basename: AMMONIA,  in the last 168 hours CBC:  Recent Labs Lab 11/13/12 2129  11/14/12 0800  11/14/12 1822 11/15/12 0920 11/16/12 0534 11/17/12 0510 11/18/12 0447  WBC 14.7*  --  13.0*  --   --  6.1 5.5 8.5 9.3  NEUTROABS 12.6*  --   --   --   --   --   --   --   --   HGB 7.7*  < > 9.5*  < > 8.8* 8.9* 9.0* 8.9* 9.7*  HCT 22.6*  < > 26.8*  < > 25.0* 25.9* 26.6* 27.5* 29.4*  MCV 85.0  --  83.8  --   --  84.9 86.1 88.4 88.0  PLT 332  --  284  --   --  220 211 220 213  < > = values in this interval not displayed. Cardiac Enzymes: No results found for this basename: CKTOTAL, CKMB, CKMBINDEX, TROPONINI,  in the last 168 hours BNP (last 3 results)  Recent Labs  11/14/12 0800  PROBNP 1488.0*   CBG:  Recent Labs Lab 11/15/12 0821  GLUCAP 101*    Recent Results (from the past 240 hour(s))  MRSA PCR SCREENING     Status: None   Collection Time    11/14/12 12:54 AM      Result Value Range Status   MRSA by PCR NEGATIVE  NEGATIVE Final   Comment:            The GeneXpert MRSA Assay (FDA     approved for NASAL specimens     only), is one component of a     comprehensive  MRSA colonization     surveillance program. It is not     intended to diagnose MRSA     infection nor to guide or     monitor treatment for     MRSA infections.  URINE CULTURE     Status: None  Collection Time    11/14/12  1:18 AM      Result Value Range Status   Specimen Description URINE, CATHETERIZED   Final   Special Requests NONE   Final   Culture  Setup Time 11/14/2012 05:48   Final   Colony Count NO GROWTH   Final   Culture NO GROWTH   Final   Report Status 11/15/2012 FINAL   Final     Studies: US Renal  11/16/2012  *RADIOLOGY REPORT*  Clinical Data: Follow-up hydronephrosis.  RENAL/URINARY TRACT ULTRASOUND COMPLETE  Comparison:  11/14/2012  Findings:  Right Kidney:  10.7 cm.  2.6 cm right upper pole cyst.  Right hydronephrosis has increased since prior study.  Left Kidney:  11.2 cm.  Very limited visualization.  No definite hydronephrosis.  2.8 cm lower pole hypoechoic area, likely cysts.  Bladder:  Foley catheter in place, decompressed.  IMPRESSION: Limited visualization of the kidneys.  There appears to be worsening right hydronephrosis and improving left hydronephrosis.   Original Report Authenticated By: Charlett Nose, M.D.     Scheduled Meds: . allopurinol  100 mg Oral q morning - 10a  . docusate sodium  100 mg Oral BID  . pantoprazole  40 mg Oral BID  . sodium chloride  3 mL Intravenous Q12H  . tamsulosin  0.4 mg Oral QPC supper   Continuous Infusions:     Time spent:25 minutes    Dailen Mcclish  [facility] Pager [phone]. If 7PM-7AM, please contact night-coverage at [email or web address], password [facility] 11/18/2012, 4:55 PM  LOS: 5 days

## 2012-11-18 NOTE — Progress Notes (Signed)
T: 98.2       BP: 147/71       P: 89       R; 18  Alert and oriented. Foley draining clear urine. BUN and creatinine continue to trend down:  19 and 1.45 respectively. Continue tamsulosin Voiding trial when OK with Hospitalist Service.

## 2012-11-19 ENCOUNTER — Encounter (HOSPITAL_COMMUNITY): Payer: Self-pay | Admitting: Gastroenterology

## 2012-11-19 DIAGNOSIS — R578 Other shock: Secondary | ICD-10-CM

## 2012-11-19 DIAGNOSIS — R571 Hypovolemic shock: Secondary | ICD-10-CM | POA: Diagnosis present

## 2012-11-19 LAB — BASIC METABOLIC PANEL
CO2: 20 mEq/L (ref 19–32)
Chloride: 110 mEq/L (ref 96–112)
Creatinine, Ser: 1.32 mg/dL (ref 0.50–1.35)

## 2012-11-19 LAB — CBC
HCT: 27.4 % — ABNORMAL LOW (ref 39.0–52.0)
MCH: 28.7 pg (ref 26.0–34.0)
MCHC: 32.5 g/dL (ref 30.0–36.0)
MCV: 88.4 fL (ref 78.0–100.0)
Platelets: 183 10*3/uL (ref 150–400)
WBC: 7.6 10*3/uL (ref 4.0–10.5)

## 2012-11-19 MED ORDER — TRAMADOL HCL 50 MG PO TABS: 50.0000 mg | ORAL_TABLET | Freq: Four times a day (QID) | ORAL | Status: DC | PRN

## 2012-11-19 MED ORDER — OXYCODONE-ACETAMINOPHEN 5-325 MG PO TABS: 1.0000 | ORAL_TABLET | Freq: Four times a day (QID) | ORAL | Status: DC | PRN

## 2012-11-19 MED ORDER — PANTOPRAZOLE SODIUM 40 MG PO TBEC: 40.0000 mg | DELAYED_RELEASE_TABLET | Freq: Every day | ORAL | Status: DC

## 2012-11-19 NOTE — Progress Notes (Addendum)
Physical Therapy Treatment Patient Details Name: [name] MRN: [id number] DOB: May 26, 1925 Today's Date: 11/19/2012 Time: 1478-2956 PT Time Calculation (min): 28 min  PT Assessment / Plan / Recommendation Comments on Treatment Session  Pt able to ambulate in hallway, however somewhat shorter distance than previous session.  Also noted some SOB during amb. Noted some fatigue with amb and SOB.  SaO2 was  98% on RA with HR at 124.  Continue to feel that pt may benefit from CIR.  However if not accepted, may benefit from ST SNF.  Feel that pt wants to return home with wife.     Follow Up Recommendations  CIR     Does the patient have the potential to tolerate intense rehabilitation     Barriers to Discharge        Equipment Recommendations  None recommended by PT    Recommendations for Other Services OT consult  Frequency Min 3X/week   Plan Frequency remains appropriate;Discharge plan needs to be updated    Precautions / Restrictions Precautions Precautions: Posterior Hip;Fall Precaution Comments: Pt able to recall 2/3 precautions this morning.  Provided more education before and during session.  Restrictions Weight Bearing Restrictions: Yes RLE Weight Bearing: Partial weight bearing RLE Partial Weight Bearing Percentage or Pounds: 50%   Pertinent Vitals/Pain Pt states no pain during session.     Mobility  Bed Mobility Bed Mobility: Supine to Sit Supine to Sit: 4: Min guard;4: Min assist;HOB flat Details for Bed Mobility Assistance: Had pt get to EOB with HOB flat to better simulate home environment with cues for LEs to boost hips and some assist for trunk to get into sitting.  Also cues for hand placement to self assist trunk.  Transfers Transfers: Sit to Stand;Stand to Sit Sit to Stand: 4: Min assist;With upper extremity assist;From elevated surface;From bed Stand to Sit: 4: Min assist;With upper extremity assist;With armrests;To chair/3-in-1 Details for Transfer  Assistance: Some assist to rise and steady as pt presents with somewhat posterior lean intially.  cues for hand placement and LE management when sitting/standing.  Ambulation/Gait Ambulation/Gait Assistance: 4: Min guard;4: Min Environmental consultant (Feet): 45 Feet Assistive device: Rolling walker Ambulation/Gait Assistance Details: min/guard to min assist to steady throughout due to noted instability with cues for sequencing/technique with RW, upright posture and pursed lip breathing.  Noted that pt was SOB during and following amb.  SaO2 on RA was 98% with HR at 125.  RN notified.  Gait Pattern: Step-to pattern;Decreased stride length;Antalgic;Trunk flexed Gait velocity: decreased    Exercises Total Joint Exercises Ankle Circles/Pumps: AROM;Both;20 reps;Seated Quad Sets: Strengthening;Both;10 reps;Seated Short Arc Quad: Strengthening;Right;10 reps;Seated Heel Slides: AAROM;Right;10 reps;Seated Hip ABduction/ADduction: AAROM;Right;10 reps;Seated   PT Diagnosis:    PT Problem List:   PT Treatment Interventions:     PT Goals Acute Rehab PT Goals PT Goal Formulation: With patient Time For Goal Achievement: 11/23/12 Potential to Achieve Goals: Good Pt will go Supine/Side to Sit: with modified independence PT Goal: Supine/Side to Sit - Progress: Progressing toward goal Pt will go Sit to Stand: with supervision PT Goal: Sit to Stand - Progress: Progressing toward goal Pt will Ambulate: 51 - 150 feet;with supervision;with least restrictive assistive device PT Goal: Ambulate - Progress: Progressing toward goal Pt will Perform Home Exercise Program: with supervision, verbal cues required/provided PT Goal: Perform Home Exercise Program - Progress: Progressing toward goal  Visit Information  Last PT Received On: 11/19/12 Assistance Needed: +1    Subjective Data  Subjective:  I just feel like I"m stuck.  Patient Stated Goal: Rehab for a week then home   Cognition   Cognition Overall Cognitive Status: Appears within functional limits for tasks assessed/performed Arousal/Alertness: Awake/alert Orientation Level: Appears intact for tasks assessed Behavior During Session: [facility] for tasks performed    Balance     End of Session PT - End of Session Equipment Utilized During Treatment: Gait belt Activity Tolerance: Patient limited by fatigue Patient left: in chair;with call bell/phone within reach Nurse Communication: Mobility status (HR during mobility. )   GP     Lorah Kalina, Meribeth Mattes 11/19/2012, 9:10 AM

## 2012-11-19 NOTE — Progress Notes (Signed)
   Subjective: 3 Days Post-Op Procedure(s) (LRB): ESOPHAGOGASTRODUODENOSCOPY (EGD) (N/A)   Patient reports pain as mild, pain well controlled. A few more staple were located at a distal right leg incision. Otherwise no events throughout the night.  Objective:   VITALS:   Filed Vitals:   11/19/12 0455  BP: 123/63  Pulse: 80  Temp: 98.6 F (37 C)  Resp: 16    Neurovascular intact Dorsiflexion/Plantar flexion intact Incision: dressing C/D/I No cellulitis present Compartment soft  LABS  Recent Labs  11/17/12 0510 11/18/12 0447 11/19/12 0437  HGB 8.9* 9.7* 8.9*  HCT 27.5* 29.4* 27.4*  WBC 8.5 9.3 7.6  PLT 220 213 183     Recent Labs  11/17/12 0510 11/18/12 0447 11/19/12 0437  NA 140 139 139  K 3.2* 3.9 3.9  BUN 29* 19 15  CREATININE 1.90* 1.45* 1.32  GLUCOSE 133* 121* 99     Assessment/Plan: 3 Days Post-Op Procedure(s) (LRB): ESOPHAGOGASTRODUODENOSCOPY (EGD) (N/A) Appears to be stable   3 Weeks Post-Op S/P Conversion of right hip ORIF to total hip arthroplasty Orthopaedically stable Tracy removed the majority of the staples, the upper incision is healing well. Staples at the distal incision are removed without difficulty, this wound is also healing well. Patient states that the plan is to go to a rehab facility to get intensive PT I have discussed the case with Dr. Charlann Boxer. We will increase his weight bearing to WBAT on the right leg. Follow up in 2 weeks at [facility]. Follow up with OLIN,Hollyann Pablo D in 2 weeks.  Contact information:  [facility] [address], [address] [place] [address] [phone]            Anastasio Auerbach. Glorimar Stroope   PAC  11/19/2012, 7:23 AM

## 2012-11-19 NOTE — Progress Notes (Signed)
T: 98.6       BP:  123/63        P: 80       R: 16  Foley removed this morning.  Has not voided yet.  Bladder not distended. P:  Reinsert Foley if unable to void.  Continue tamsulosin.

## 2012-11-19 NOTE — Progress Notes (Signed)
Physical medicine and rehabilitation consult requested and chart reviewed. Patient with recent conversion of failed right hip surgery 2 right total hip replacement 10/30/2012 was discharged to skilled nursing facility 11/01/2012. Readmitted 325 acute renal failure since resolved his latest creatinine within normal limits. Patient is progressing nicely and has been advanced to weightbearing as tolerated today per orthopedic services. Patient at this time does not meet medical necessity for inpatient rehabilitation services recommended returning back to skilled nursing facility versus home with 24-hour assistance if possible. Contact made to social worker to discuss discharge plan and await return call

## 2012-11-19 NOTE — Care Management Note (Addendum)
    Page 1 of 2   11/19/2012     12:47:09 PM   CARE MANAGEMENT NOTE 11/19/2012  Patient:  [name], [name]   Account Number:  [id number]  Date Initiated:  11/14/2012  Documentation initiated by:  DAVIS,RHONDA  Subjective/Objective Assessment:   pt with recent history of a rt hip revision, dcd on [id number] to [facility],readmitted on [id number] with k-6.7,na-125, ams, hgb 7.7,     Action/Plan:   has been resident at [facility].   Anticipated DC Date:  11/19/2012   Anticipated DC Plan:  HOME W HOME HEALTH SERVICES  In-house referral  Clinical Social Worker      DC Planning Services  CM consult      [facility] Choice  HOME HEALTH   Choice offered to / List presented to:  C-4 Adult Children   DME arranged  WHEELCHAIR - MANUAL      DME agency  [facility]     HH arranged  HH-2 PT  HH-3 OT  HH-1 RN      [facility] agency  [facility]   Status of service:  Completed, signed off Medicare Important Message given?  NA - LOS <3 / Initial given by admissions (If response is "NO", the following Medicare IM given date fields will be blank) Date Medicare IM given:   Date Additional Medicare IM given:    Discharge Disposition:  HOME W HOME HEALTH SERVICES  Per UR Regulation:  Reviewed for med. necessity/level of care/duration of stay  If discussed at Long Length of Stay Meetings, dates discussed:    Comments:  11-19-12 Lorenda Ishihara RN CM 1200 Spoke with patient, wife and daughter at bedside. They have decided to take patient home with [facility] services. Have used [facility] in the past and would like to use them again. Requesting a wheelchair for home but have all other DME needed including RW and bathroom accessories.  [id number] Earlene Plater, RN, BSN, CCM:  CHART REVIEWED AND UPDATED.  Next chart review due on [id number]. NO DISCHARGE NEEDS PRESENT AT THIS TIME. CASE MANAGEMENT [phone]

## 2012-11-19 NOTE — Progress Notes (Signed)
Rehab Admissions Coordinator Note:  Patient was screened by Trish Mage for appropriateness for an Inpatient Acute Rehab Consult.  At this time, we are recommending Skilled Nursing Facility.  Could consider home with [facility] if patient progresses.  Please see note left by Rehab PA.  Trish Mage 11/19/2012, 9:28 AM  I can be reached at [phone].

## 2012-11-19 NOTE — Discharge Summary (Signed)
Physician Discharge Summary  [name] ZOX:[id number] DOB: November 09, 1924 DOA: 11/13/2012  PCP: Verneita Griffes, MD  Admit date: 11/13/2012 Discharge date: 11/19/2012  Time spent: 40 minutes  Recommendations for Outpatient Follow-up:  1. Home with home health PT/RN ,. Needs foley care 2. F/up with Dr Brunilda Payor in 2 weeks 3. Follow up with Dr Charlann Boxer in 2 weeks 4. F/up with PCP in 1 week . Need labs checked  Discharge Diagnoses:  Principal Problem: Hypovolemic  Shock with Uremic encephalopathy  Active Problems:   Hyperkalemia   ARF (acute renal failure)   BENIGN PROSTATIC HYPERTROPHY   Obstructive uropathy   HYPERTENSION   S/P right TH revision   Melena   Anemia   Leukocytosis   Hyponatremia   Hypokalemia   Discharge Condition: FAIR  Diet recommendation: low sodium  Filed Weights   11/17/12 0500 11/18/12 0516 11/19/12 0455  Weight: 69 kg (152 lb 1.9 oz) 67.4 kg (148 lb 9.4 oz) 66.1 kg (145 lb 11.6 oz)    History of present illness:  79 year old male with history of BPH, recent right hip revision surgery, mild CK D. with creatinine of 1.4 one week back, recent UTI treated with Bactrim was sent from the nursing home for confusion, melanotic stool with drop in H&H, hyperkalemia and acute kidney injury. Patient septic with melanotic stool with significant drop in H&H, hyperkalemia, acute kidney injury with uremic symptoms and metabolic acidosis.   Hospital Course:    Hypovolemic shock with uremic encephalopathy Patient presented with leukocytosis, hypotension and tachycardia with melanotic stool and drop in H&H. Also had hyperkalemia, significant acute kidney injury with uremic encephalopathy and anion gap metabolic acidosis.  -Patient admitted to step down for closer monitoring. Received 2 units PRBC and his H&H improved and stable. A Foley was placed.  - Patient started on bicarbonate drip and renal function started to improve. Acute kidney injury likely in the setting of  obstructive uropathy with known history of BPH with associated dehydration.  -renal function now back to baseline. Patient failed voiding trial with 900 cc residue on bladder scan. Discussed with Dr Brunilda Payor. Will discharge him home on foley catheter with outpt follow up with him in 2 weeks.  -  Acute on chronic kidney disease  As outlined above. Uremic symptoms have resolved. Off fluids. . has good urine output. Renal ultrasound suggests bilateral hydronephrosis and very likely for bladder outlet obstruction. -Continue Flomax  -Appreciate renal recommendations.  -repeat renal ultrasound done shows worsening rt hydronephrosis but improved on left. His overall renal fn has improved. . -  -seen by his urologist by Dr. Brunilda Payor.    Anemia with GI bleeding  Hemoglobin improved after 2 units PRBC.  -was placed on protonix drip switched to bid PPI  - EGD done on 3/28 shows healing mild esophagitis.  -will be discharged on once daily PPI.  Recent rt hip revision with coagulopathy  Patient on pradaxa for DVT prophylaxis following recent total hip revision. dced given high INR and melanotic stool.  Recent right total hip revision.  Since he is more than 2 weeks from the procedure Dr Charlann Boxer recommended that he does not need further DVT prophylaxis.   staples removed by ortho . consulted CIR for inpatient rehab but was refused. Family agree to  take pt home with HHPT. Ortho recommends WBAT. Will see Dr Charlann Boxer in 2 weeks.  Hyponatremia  Possibly in the setting of dehydration and now resolved.   Hyperkalemia  Secondary to acute kidney injury and  possibly recent Bactrim use. Now resolved   Code Status: Full code  Family Communication: wife and daughter at bedside  Disposition Plan: home with HHPT/RN. Follow up with PCP, urology and ortho as outpt.  Consultants:  Renal (Dr. Arlean Hopping)  GI [facility])  Urology ( Dr Brunilda Payor)  Dr Charlann Boxer    Procedures:  EGD on 3/28    Antibiotics:  IV Rocephin (  3/26-3/28)   Discharge Exam: Filed Vitals:   11/18/12 0516 11/18/12 1252 11/18/12 2206 11/19/12 0455  BP: 147/71 138/63 131/55 123/63  Pulse: 89 91 88 80  Temp: 98.2 F (36.8 C) 98 F (36.7 C) 98.4 F (36.9 C) 98.6 F (37 C)  TempSrc: Oral Axillary Oral Oral  Resp: 18 18 24 16   Height:      Weight: 67.4 kg (148 lb 9.4 oz)   66.1 kg (145 lb 11.6 oz)  SpO2: 100% 100% 100% 100%    General: Elderly male lying in bed in no acute distress.  HEENT: No pallor, dry oral mucosa  Cardiovascular: S1 S2 normal, no murmurs rub or gallop  Respiratory: Clear to auscultation bilaterally  Abdomen: Soft, nontender, nondistended, bowel sounds present Musculoskeletal: Warm, no edema , rt hip staples removed  CNS: Alert and awake, oriented x3  Discharge Instructions  Discharge Orders   Future Appointments Provider Department Dept Phone   04/11/2013 1:00 PM Gordy Savers, MD [facility] [phone]   Future Orders Complete By Expires     Weight bearing as tolerated  As directed         Medication List    STOP taking these medications       rivaroxaban 10 MG Tabs tablet  Commonly known as:  XARELTO     ROCEPHIN 1 G injection  Generic drug:  cefTRIAXone     sulfamethoxazole-trimethoprim 800-160 MG per tablet  Commonly known as:  BACTRIM DS,SEPTRA DS      TAKE these medications       allopurinol 100 MG tablet  Commonly known as:  ZYLOPRIM  Take 100 mg by mouth every morning.     DSS 100 MG Caps  Take 100 mg by mouth 2 (two) times daily.     ferrous sulfate 325 (65 FE) MG tablet  Take 325 mg by mouth every morning.     meclizine 12.5 MG tablet  Commonly known as:  ANTIVERT  Take 12.5 mg by mouth every 4 (four) hours as needed (nausea and vomiting).     oxyCODONE-acetaminophen 5-325 MG per tablet  Commonly known as:  PERCOCET/ROXICET  Take 1 tablet by mouth every 6 (six) hours as needed.     pantoprazole 40 MG tablet  Commonly known as:   PROTONIX  Take 1 tablet (40 mg total) by mouth daily.     polyethylene glycol packet  Commonly known as:  MIRALAX / GLYCOLAX  Take 17 g by mouth 2 (two) times daily.     tamsulosin 0.4 MG Caps  Commonly known as:  FLOMAX  Take 0.4 mg by mouth daily after supper.     traMADol 50 MG tablet  Commonly known as:  ULTRAM  Take 1 tablet (50 mg total) by mouth every 6 (six) hours as needed (pain).           Follow-up Information   Follow up with Shelda Pal, MD. Schedule an appointment as soon as possible for a visit in 4 weeks.   Contact information:   [address], [address]  [place] [address] [phone]       Follow up with Verneita Griffes, MD In 1 week.   Contact information:   [address] [place] [address] [phone]       Follow up with NESI,MARC-HENRY, MD In 2 weeks.   Contact information:   [address] [place] [address] [phone]        The results of significant diagnostics from this hospitalization (including imaging, microbiology, ancillary and laboratory) are listed below for reference.    Significant Diagnostic Studies: Dg Chest 2 View  10/29/2012  *RADIOLOGY REPORT*  Clinical Data: Preop for hip surgery  CHEST - 2 VIEW  Comparison: Portable chest x-ray of 04/07/2011  Findings: No active infiltrate or effusion is seen.  The heart is mildly enlarged and stable.  There are degenerative changes throughout the thoracic spine.  IMPRESSION: No active lung disease.  Mild cardiomegaly.   Original Report Authenticated By: Dwyane Dee, M.D.    US Renal  11/16/2012  *RADIOLOGY REPORT*  Clinical Data: Follow-up hydronephrosis.  RENAL/URINARY TRACT ULTRASOUND COMPLETE  Comparison:  11/14/2012  Findings:  Right Kidney:  10.7 cm.  2.6 cm right upper pole cyst.  Right hydronephrosis has increased since prior study.  Left Kidney:  11.2 cm.  Very limited visualization.  No definite hydronephrosis.   2.8 cm lower pole hypoechoic area, likely cysts.  Bladder:  Foley catheter in place, decompressed.  IMPRESSION: Limited visualization of the kidneys.  There appears to be worsening right hydronephrosis and improving left hydronephrosis.   Original Report Authenticated By: Charlett Nose, M.D.    US Renal  11/14/2012  *RADIOLOGY REPORT*  Clinical Data: Acute renal failure  RENAL/URINARY TRACT ULTRASOUND COMPLETE  Comparison:  CT abdomen pelvis dated 11/11/2009  Findings:  Right Kidney:  Poorly visualized.  Measures 10.2 cm.  Mild hydronephrosis.  Left Kidney:  Poorly visualized.  Measures 9.9 cm.  1.7 x 2.3 x 2.2 cm lower pole cyst.  Mild hydronephrosis.  Bladder:  Decompressed by indwelling Foley catheter.  IMPRESSION: Bilateral kidneys are poorly visualized.  Mild bilateral hydronephrosis is suspected.  2.3 cm left lower pole renal cyst.   Original Report Authenticated By: Charline Bills, M.D.    Dg Pelvis Portable  10/29/2012  *RADIOLOGY REPORT*  Clinical Data: Status post right total hip replacement.  PORTABLE PELVIS  Comparison: 12/11/2011.  Findings: Interval removal of the previously demonstrated right femoral fixation hardware and placement of a right total hip prosthesis.  There is a fracture fragment medial to the proximal portion of the femoral component.  There is also lucency lateral to the femoral stem, proximally, which may be at least partially artifactual due to overlying soft tissue air.  A surgical drain and skin clips are also in place.  IMPRESSION: Interval right total hip prosthesis with a medial fracture fragment.   Original Report Authenticated By: Beckie Salts, M.D.    Portable Chest 1 View  11/14/2012  *RADIOLOGY REPORT*  Clinical Data: Bilateral basilar infiltrates.  PORTABLE CHEST - 1 VIEW  Comparison: 10/29/2012 chest radiograph.  Findings: Low lung volumes are present with elevation of the left hemidiaphragm greater than right.  Increasing atelectasis at the bases.  Superimposed  airspace disease may be present, particularly at the left lung base.  Right glenohumeral osteoarthritis is noted. Surgical clips in the right upper quadrant.  Cardiopericardial  silhouette appears unchanged allowing for lower volumes of inspiration.  IMPRESSION: Low volume chest.  Left greater than right basilar atelectasis. Superimposed left lower lobe airspace disease cannot be excluded.   Original Report Authenticated By: Andreas Newport, M.D.    Dg Hip Portable 1 View Right  10/29/2012  *RADIOLOGY REPORT*  Clinical Data: Status post right total hip arthroplasty.  PORTABLE RIGHT HIP - 1 VIEW  Comparison: 12/11/2011.  Findings: Previously noted gamma nail fixation device has been removed and there has been interval total hip arthroplasty.  The lateral projection demonstrates proper location of the femoral head within the prosthetic prosthetic acetabular cup.  Gas is present in the overlying soft tissues.  The femoral stem and the acetabular cup and both appear to be well seated.  A surgical drain is in place in the upper five.  Overlying skin staples are noted.  IMPRESSION: Postoperative changes of right total hip arthroplasty without definite acute complicating features, as above.   Original Report Authenticated By: Trudie Reed, M.D.     Microbiology: Recent Results (from the past 240 hour(s))  MRSA PCR SCREENING     Status: None   Collection Time    11/14/12 12:54 AM      Result Value Range Status   MRSA by PCR NEGATIVE  NEGATIVE Final   Comment:            The GeneXpert MRSA Assay (FDA     approved for NASAL specimens     only), is one component of a     comprehensive MRSA colonization     surveillance program. It is not     intended to diagnose MRSA     infection nor to guide or     monitor treatment for     MRSA infections.  URINE CULTURE     Status: None   Collection Time    11/14/12  1:18 AM      Result Value Range Status   Specimen Description URINE, CATHETERIZED   Final    Special Requests NONE   Final   Culture  Setup Time 11/14/2012 05:48   Final   Colony Count NO GROWTH   Final   Culture NO GROWTH   Final   Report Status 11/15/2012 FINAL   Final     Labs: Basic Metabolic Panel:  Recent Labs Lab 11/15/12 0335 11/16/12 0534 11/16/12 2218 11/17/12 0510 11/18/12 0447 11/19/12 0437  NA 140 140  --  140 139 139  K 3.4* 3.3*  --  3.2* 3.9 3.9  CL 109 111  --  112 111 110  CO2 20 18*  --  18* 20 20  GLUCOSE 107* 97  --  133* 121* 99  BUN 69* 38*  --  29* 19 15  CREATININE 4.01* 2.14*  --  1.90* 1.45* 1.32  CALCIUM 8.0* 7.9*  --  7.8* 7.9* 8.1*  MG  --   --  1.6  --   --   --    Liver Function Tests:  Recent Labs Lab 11/14/12 0800  AST 98*  ALT 61*  ALKPHOS 172*  BILITOT 1.1  PROT 5.6*  ALBUMIN 2.2*   No results found for this basename: LIPASE, AMYLASE,  in the last 168 hours No results found for this basename: AMMONIA,  in the last 168 hours CBC:  Recent Labs Lab 11/13/12 2129  11/15/12 0920 11/16/12 0534 11/17/12 0510 11/18/12 0447 11/19/12 0437  WBC 14.7*  < > 6.1 5.5 8.5 9.3 7.6  NEUTROABS 12.6*  --   --   --   --   --   --   HGB 7.7*  < > 8.9* 9.0* 8.9* 9.7* 8.9*  HCT 22.6*  < > 25.9* 26.6* 27.5* 29.4* 27.4*  MCV 85.0  < > 84.9 86.1 88.4 88.0 88.4  PLT 332  < > 220 211 220 213 183  < > = values in this interval not displayed. Cardiac Enzymes: No results found for this basename: CKTOTAL, CKMB, CKMBINDEX, TROPONINI,  in the last 168 hours BNP: BNP (last 3 results)  Recent Labs  11/14/12 0800  PROBNP 1488.0*   CBG:  Recent Labs Lab 11/15/12 0821  GLUCAP 101*       Signed:  Fletcher Rathbun  [facility] 11/19/2012, 10:21 AM

## 2012-11-19 NOTE — Progress Notes (Signed)
CSW met with patient, his wife Britta Mccreedy & his daughter Andrey Campanile at bedside re: discharge planning. Note [facility] declined patient, daughter plans to take patient home with home health services. Requesting wheelchair. RNCM, Bonita Quin made aware. CSW signing off.   Clinical Social Work Department CLINICAL SOCIAL WORK PLACEMENT NOTE 11/19/2012  Patient:  [name], [name]  Account Number:  [id number] Admit date:  11/13/2012  Clinical Social Worker:  Jodelle Red  Date/time:  11/15/2012 10:45 AM  Clinical Social Work is seeking post-discharge placement for this patient at the following level of care:   SKILLED NURSING   (*CSW will update this form in Epic as items are completed)   11/14/2012  Patient/family provided with [facility] Department of Clinical Social Work's list of facilities offering this level of care within the geographic area requested by the patient (or if unable, by the patient's family).  11/14/2012  Patient/family informed of their freedom to choose among providers that offer the needed level of care, that participate in Medicare, Medicaid or managed care program needed by the patient, have an available bed and are willing to accept the patient.  11/14/2012  Patient/family informed of [facility]' ownership interest in [facility], as well as of the fact that they are under no obligation to receive care at this facility.  PASARR submitted to EDS on  PASARR number received from EDS on   FL2 transmitted to all facilities in geographic area requested by pt/family on  11/14/2012 FL2 transmitted to all facilities within larger geographic area on   Patient informed that his/her managed care company has contracts with or will negotiate with  certain facilities, including the following:     Patient/family informed of bed offers received:  11/16/12 Patient chooses bed at declined - plans to d/c home Physician recommends and patient chooses bed at    Patient to be  transferred to home on  11/19/12 Patient to be transferred to facility by   The following physician request were entered in Epic:   Additional Comments: Patient's daughter plans to take patient home with home health.  Unice Bailey, LCSW [facility] Clinical Social Worker cell #: [phone]

## 2012-11-19 NOTE — Progress Notes (Signed)
Pt unable to void since catheter removal this am. Bladder scan showed >900cc in bladder. MD notified, will reinsert Foley for discharge home. Julio Sicks RN

## 2012-11-20 ENCOUNTER — Encounter (HOSPITAL_COMMUNITY): Payer: Self-pay | Admitting: Vascular Surgery

## 2012-11-20 ENCOUNTER — Observation Stay (HOSPITAL_COMMUNITY)
Admission: EM | Admit: 2012-11-20 | Discharge: 2012-11-23 | Disposition: A | Discharge status: Skilled Nursing Facility | Payer: Medicare Other | Attending: Family Medicine | Admitting: Family Medicine

## 2012-11-20 ENCOUNTER — Emergency Department (HOSPITAL_COMMUNITY): Payer: Medicare Other

## 2012-11-20 DIAGNOSIS — D62 Acute posthemorrhagic anemia: Secondary | ICD-10-CM

## 2012-11-20 DIAGNOSIS — D696 Thrombocytopenia, unspecified: Secondary | ICD-10-CM | POA: Diagnosis present

## 2012-11-20 DIAGNOSIS — D649 Anemia, unspecified: Secondary | ICD-10-CM

## 2012-11-20 DIAGNOSIS — R571 Hypovolemic shock: Secondary | ICD-10-CM

## 2012-11-20 DIAGNOSIS — E876 Hypokalemia: Secondary | ICD-10-CM

## 2012-11-20 DIAGNOSIS — E8770 Fluid overload, unspecified: Secondary | ICD-10-CM

## 2012-11-20 DIAGNOSIS — I5031 Acute diastolic (congestive) heart failure: Secondary | ICD-10-CM

## 2012-11-20 DIAGNOSIS — N179 Acute kidney failure, unspecified: Secondary | ICD-10-CM

## 2012-11-20 DIAGNOSIS — R0789 Other chest pain: Secondary | ICD-10-CM

## 2012-11-20 DIAGNOSIS — G4700 Insomnia, unspecified: Secondary | ICD-10-CM

## 2012-11-20 DIAGNOSIS — I5030 Unspecified diastolic (congestive) heart failure: Secondary | ICD-10-CM | POA: Insufficient documentation

## 2012-11-20 DIAGNOSIS — D72829 Elevated white blood cell count, unspecified: Secondary | ICD-10-CM

## 2012-11-20 DIAGNOSIS — I509 Heart failure, unspecified: Secondary | ICD-10-CM | POA: Insufficient documentation

## 2012-11-20 DIAGNOSIS — G9349 Other encephalopathy: Secondary | ICD-10-CM

## 2012-11-20 DIAGNOSIS — R0602 Shortness of breath: Secondary | ICD-10-CM | POA: Insufficient documentation

## 2012-11-20 DIAGNOSIS — K921 Melena: Secondary | ICD-10-CM

## 2012-11-20 DIAGNOSIS — M109 Gout, unspecified: Secondary | ICD-10-CM

## 2012-11-20 DIAGNOSIS — N400 Enlarged prostate without lower urinary tract symptoms: Secondary | ICD-10-CM

## 2012-11-20 DIAGNOSIS — R079 Chest pain, unspecified: Principal | ICD-10-CM | POA: Insufficient documentation

## 2012-11-20 DIAGNOSIS — E875 Hyperkalemia: Secondary | ICD-10-CM

## 2012-11-20 DIAGNOSIS — Z96649 Presence of unspecified artificial hip joint: Secondary | ICD-10-CM

## 2012-11-20 DIAGNOSIS — E871 Hypo-osmolality and hyponatremia: Secondary | ICD-10-CM

## 2012-11-20 DIAGNOSIS — I10 Essential (primary) hypertension: Secondary | ICD-10-CM

## 2012-11-20 DIAGNOSIS — E8779 Other fluid overload: Secondary | ICD-10-CM | POA: Insufficient documentation

## 2012-11-20 DIAGNOSIS — N139 Obstructive and reflux uropathy, unspecified: Secondary | ICD-10-CM

## 2012-11-20 LAB — COMPREHENSIVE METABOLIC PANEL
ALT: 36 U/L (ref 0–53)
Alkaline Phosphatase: 115 U/L (ref 39–117)
BUN: 11 mg/dL (ref 6–23)
CO2: 19 mEq/L (ref 19–32)
Chloride: 105 mEq/L (ref 96–112)
GFR calc Af Amer: 51 mL/min — ABNORMAL LOW (ref >90)
GFR calc non Af Amer: 44 mL/min — ABNORMAL LOW (ref >90)
Glucose, Bld: 135 mg/dL — ABNORMAL HIGH (ref 70–99)
Potassium: 3.4 mEq/L — ABNORMAL LOW (ref 3.5–5.1)
Sodium: 135 mEq/L (ref 135–145)
Total Bilirubin: 0.4 mg/dL (ref 0.3–1.2)

## 2012-11-20 LAB — TROPONIN I: Troponin I: <0.3 ng/mL (ref <0.30)

## 2012-11-20 LAB — CBC WITH DIFFERENTIAL/PLATELET
Hemoglobin: 9.5 g/dL — ABNORMAL LOW (ref 13.0–17.0)
Lymphocytes Relative: 10 % — ABNORMAL LOW (ref 12–46)
Lymphs Abs: 1.1 10*3/uL (ref 0.7–4.0)
MCV: 84.5 fL (ref 78.0–100.0)
Monocytes Relative: 9 % (ref 3–12)
Neutrophils Relative %: 79 % — ABNORMAL HIGH (ref 43–77)
Platelets: 177 10*3/uL (ref 150–400)
RBC: 3.3 MIL/uL — ABNORMAL LOW (ref 4.22–5.81)
WBC: 10.1 10*3/uL (ref 4.0–10.5)

## 2012-11-20 LAB — TYPE AND SCREEN

## 2012-11-20 LAB — ABO/RH: ABO/RH(D): A NEG

## 2012-11-20 LAB — PRO B NATRIURETIC PEPTIDE: Pro B Natriuretic peptide (BNP): 2537 pg/mL — ABNORMAL HIGH (ref 0–450)

## 2012-11-20 MED ORDER — PANTOPRAZOLE SODIUM 40 MG PO TBEC
40.0000 mg | DELAYED_RELEASE_TABLET | Freq: Every day | ORAL | Status: DC
  Administered 2012-11-21 – 2012-11-23 (×3): 40 mg via ORAL
  Filled 2012-11-20 (×3): qty 1

## 2012-11-20 MED ORDER — ALLOPURINOL 100 MG PO TABS
100.0000 mg | ORAL_TABLET | Freq: Every morning | ORAL | Status: DC
  Administered 2012-11-21 – 2012-11-23 (×3): 100 mg via ORAL
  Filled 2012-11-20 (×3): qty 1

## 2012-11-20 MED ORDER — FUROSEMIDE 10 MG/ML IJ SOLN
40.0000 mg | Freq: Once | INTRAMUSCULAR | Status: AC
  Administered 2012-11-20: 40 mg via INTRAVENOUS
  Filled 2012-11-20: qty 4

## 2012-11-20 MED ORDER — FERROUS SULFATE 325 (65 FE) MG PO TABS
325.0000 mg | ORAL_TABLET | Freq: Every morning | ORAL | Status: DC
  Administered 2012-11-21 – 2012-11-23 (×3): 325 mg via ORAL
  Filled 2012-11-20 (×3): qty 1

## 2012-11-20 MED ORDER — DOCUSATE SODIUM 100 MG PO CAPS
100.0000 mg | ORAL_CAPSULE | Freq: Two times a day (BID) | ORAL | Status: DC
  Administered 2012-11-21 – 2012-11-23 (×5): 100 mg via ORAL
  Filled 2012-11-20 (×7): qty 1

## 2012-11-20 MED ORDER — POLYETHYLENE GLYCOL 3350 17 G PO PACK
17.0000 g | PACK | Freq: Two times a day (BID) | ORAL | Status: DC
  Administered 2012-11-21 – 2012-11-22 (×4): 17 g via ORAL
  Filled 2012-11-20 (×7): qty 1

## 2012-11-20 MED ORDER — ACETAMINOPHEN 500 MG PO TABS
500.0000 mg | ORAL_TABLET | Freq: Once | ORAL | Status: AC
  Administered 2012-11-21: 500 mg via ORAL
  Filled 2012-11-20 (×2): qty 1

## 2012-11-20 MED ORDER — TAMSULOSIN HCL 0.4 MG PO CAPS
0.4000 mg | ORAL_CAPSULE | Freq: Every day | ORAL | Status: DC
  Administered 2012-11-21 – 2012-11-22 (×2): 0.4 mg via ORAL
  Filled 2012-11-20 (×3): qty 1

## 2012-11-20 MED ORDER — SODIUM CHLORIDE 0.9 % IJ SOLN
3.0000 mL | Freq: Two times a day (BID) | INTRAMUSCULAR | Status: DC
  Administered 2012-11-21 – 2012-11-22 (×3): 3 mL via INTRAVENOUS

## 2012-11-20 MED ORDER — FUROSEMIDE 10 MG/ML IJ SOLN
40.0000 mg | Freq: Every day | INTRAMUSCULAR | Status: DC
  Administered 2012-11-21 – 2012-11-22 (×2): 40 mg via INTRAVENOUS
  Filled 2012-11-20 (×2): qty 4

## 2012-11-20 MED ORDER — HEPARIN SODIUM (PORCINE) 5000 UNIT/ML IJ SOLN
5000.0000 [IU] | Freq: Three times a day (TID) | INTRAMUSCULAR | Status: DC
  Administered 2012-11-21 – 2012-11-23 (×7): 5000 [IU] via SUBCUTANEOUS
  Filled 2012-11-20 (×12): qty 1

## 2012-11-20 NOTE — H&P (Signed)
[facility] History and Physical  [name] NGE:[id number] DOB: [birth year]    PCP:   Verneita Griffes, MD   Chief Complaint: Chest tightness.  HPI: [name] is an 79 y.o. male with multiple medical problems including recent admission for acute kidney injury, bilateral hydronephrosis with  failed voiding trial, requiring Foley catheter placement (to have follow up with Dr Brunilda Payor), history of melanotic stool requiring intravenous PPI drip, hypovolemic shock, discharged literally yesterday from [facility], brought back to the emergency room by her family as he was having chest tightness. Family doesn't feel that he is ready to be discharged home. Patient stated that he feels fine without any shortness of breath or even chest pain. He did admit to having lower extremity edema. Evaluation in emergency room included unremarkable electrolytes with creatinine of 1.4, normal white count, hemoglobin of 9.5 g per decaliter and stable. EKG showed normal sinus rhythm without any acute ST-T changes. He was given intravenous Lasix in emergency room and felt markedly better. Family however did not feel comfortable taking him home and requested that he be readmitted for further diuresing.  Rewiew of Systems:  Constitutional: Negative for malaise, fever and chills. No significant weight loss or weight gain Eyes: Negative for eye pain, redness and discharge, diplopia, visual changes, or flashes of light. ENMT: Negative for ear pain, hoarseness, nasal congestion, sinus pressure and sore throat. No headaches; tinnitus, drooling, or problem swallowing. Cardiovascular: Negative for  palpitations, diaphoresis, dyspnea ; No orthopnea, PND Respiratory: Negative for cough, hemoptysis, wheezing and stridor. No pleuritic chestpain. Gastrointestinal: Negative for nausea, vomiting, diarrhea, constipation, abdominal pain, melena, blood in stool, hematemesis, jaundice and rectal bleeding.    Genitourinary:  Negative for frequency, dysuria, incontinence,flank pain and hematuria; Musculoskeletal: Negative for back pain and neck pain. Negative for swelling and trauma.;  Skin: . Negative for pruritus, rash, abrasions, bruising and skin lesion.; ulcerations Neuro: Negative for headache, lightheadedness and neck stiffness. Negative for weakness, altered level of consciousness , altered mental status, extremity weakness, burning feet, involuntary movement, seizure and syncope.  Psych: negative for anxiety, depression, insomnia, tearfulness, panic attacks, hallucinations, paranoia, suicidal or homicidal ideation    Past Medical History  Diagnosis Date  . Gout   . Insomnia   . BPH (benign prostatic hyperplasia)   . Kidney disorder   . Arthritis   . Hypertension     patient denies on 10/25/12 on no meds   . Complication of anesthesia     hard to wake up -2011 after hernia surgery     Past Surgical History  Procedure Laterality Date  . Cataract extraction    . Cholecystectomy    . Hernia repair  02/22/11    right  . Joint replacement      fractured hip  . Total hip arthroplasty Right 10/29/2012    Procedure: CONVERSION OF PREVIOUS HIP SURGERY TO RIGHT TOTAL HIP AND REMOVAL OF IM NAIL ;  Surgeon: Shelda Pal, MD;  Location: [facility];  Service: Orthopedics;  Laterality: Right;  . Esophagogastroduodenoscopy N/A 11/16/2012    Procedure: ESOPHAGOGASTRODUODENOSCOPY (EGD);  Surgeon: Theda Belfast, MD;  Location: [facility];  Service: Endoscopy;  Laterality: N/A;    Medications:  HOME MEDS: Prior to Admission medications   Medication Sig Start Date End Date Taking? Authorizing Provider  acetaminophen (TYLENOL) 500 MG tablet Take 500 mg by mouth once.   Yes Historical Provider, MD  allopurinol (ZYLOPRIM) 100 MG tablet Take 100 mg by mouth every morning.  09/07/12  Yes Gordy Savers, MD  docusate sodium 100 MG CAPS Take 100 mg by mouth 2 (two) times daily. 11/01/12  Yes Genelle Gather Babish, PA-C   ferrous sulfate 325 (65 FE) MG tablet Take 325 mg by mouth every morning. 11/01/12  Yes Genelle Gather Babish, PA-C  pantoprazole (PROTONIX) 40 MG tablet Take 1 tablet (40 mg total) by mouth daily. 11/19/12  Yes Nishant Dhungel, MD  polyethylene glycol (MIRALAX / GLYCOLAX) packet Take 17 g by mouth 2 (two) times daily. 11/01/12  Yes Genelle Gather Babish, PA-C  tamsulosin (FLOMAX) 0.4 MG CAPS Take 0.4 mg by mouth daily after supper. 09/07/12  Yes Gordy Savers, MD     Allergies:  Allergies  Allergen Reactions  . Nsaids Other (See Comments)    history of renal failure    Social History:   reports that he has never smoked. He has never used smokeless tobacco. He reports that he does not drink alcohol or use illicit drugs.  Family History: Family History  Problem Relation Age of Onset  . Heart failure Mother   . Heart failure Father   . Diabetes Neg Hx      Physical Exam: Filed Vitals:   11/20/12 2115 11/20/12 2130 11/20/12 2145 11/20/12 2200  BP: 113/49 126/50 110/53 112/54  Pulse: 79 87 80 80  Temp:      TempSrc:      Resp: 18 17 19 18   SpO2: 100% 100% 100% 99%   Blood pressure 112/54, pulse 80, temperature 99.2 F (37.3 C), temperature source Oral, resp. rate 18, SpO2 99.00%.  GEN:  Pleasant  patient lying in the stretcher in no acute distress; cooperative with exam. PSYCH:  alert and oriented x4; does not appear anxious or depressed; affect is appropriate. HEENT: Mucous membranes pink and anicteric; PERRLA; EOM intact; no cervical lymphadenopathy nor thyromegaly or carotid bruit; no JVD; There were no stridor. Neck is very supple. Breasts:: Not examined CHEST WALL: No tenderness CHEST: Normal respiration, clear to auscultation bilaterally.  HEART: Regular rate and rhythm.  There are no murmur, rub, or gallops.   BACK: No kyphosis or scoliosis; no CVA tenderness ABDOMEN: soft and non-tender; no masses, no organomegaly, normal abdominal bowel sounds; no pannus; no  intertriginous candida. There is no rebound and no distention. Rectal Exam: Not done EXTREMITIES: No bone or joint deformity; age-appropriate arthropathy of the hands and knees; significant lower extremity edema no ulcerations.  There is no calf tenderness. Genitalia: not examined PULSES: 2+ and symmetric SKIN: Normal hydration no rash or ulceration CNS: Cranial nerves 2-12 grossly intact no focal lateralizing neurologic deficit.  Speech is fluent; uvula elevated with phonation, facial symmetry and tongue midline. DTR are normal bilaterally, cerebella exam is intact, barbinski is negative and strengths are equaled bilaterally.  No sensory loss.   Labs on Admission:  Basic Metabolic Panel:  Recent Labs Lab 11/16/12 0534 11/16/12 2218 11/17/12 0510 11/18/12 0447 11/19/12 0437 11/20/12 1953  NA 140  --  140 139 139 135  K 3.3*  --  3.2* 3.9 3.9 3.4*  CL 111  --  112 111 110 105  CO2 18*  --  18* 20 20 19   GLUCOSE 97  --  133* 121* 99 135*  BUN 38*  --  29* 19 15 11   CREATININE 2.14*  --  1.90* 1.45* 1.32 1.40*  CALCIUM 7.9*  --  7.8* 7.9* 8.1* 7.8*  MG  --  1.6  --   --   --   --  Liver Function Tests:  Recent Labs Lab 11/14/12 0800 11/20/12 1953  AST 98* 50*  ALT 61* 36  ALKPHOS 172* 115  BILITOT 1.1 0.4  PROT 5.6* 5.2*  ALBUMIN 2.2* 2.3*   No results found for this basename: LIPASE, AMYLASE,  in the last 168 hours No results found for this basename: AMMONIA,  in the last 168 hours CBC:  Recent Labs Lab 11/16/12 0534 11/17/12 0510 11/18/12 0447 11/19/12 0437 11/20/12 1953  WBC 5.5 8.5 9.3 7.6 10.1  NEUTROABS  --   --   --   --  8.0*  HGB 9.0* 8.9* 9.7* 8.9* 9.5*  HCT 26.6* 27.5* 29.4* 27.4* 27.9*  MCV 86.1 88.4 88.0 88.4 84.5  PLT 211 220 213 183 177   Cardiac Enzymes:  Recent Labs Lab 11/20/12 1954  TROPONINI <0.30    CBG:  Recent Labs Lab 11/15/12 0821  GLUCAP 101*     Radiological Exams on Admission: Dg Chest 2 View  11/20/2012   *RADIOLOGY REPORT*  Clinical Data: Short of breath  CHEST - 2 VIEW  Comparison: 11/14/2012  Findings: Normal heart size.  Low volumes.  Bilateral pleural effusions left greater than right.  Bibasilar patchy opacities are present left greater than right.  Pulmonary vascularity is within normal limits.  No pneumothorax.  IMPRESSION: Bilateral pleural effusions and bibasilar atelectasis verses airspace disease left greater than right.   Original Report Authenticated By: Jolaine Click, M.D.      Assessment/Plan  Atypical chest pain Chronic lower ext. Edema. Failed voiding trial DM Gout   PLAN: Will admit him for further diuresing. He'll be given 40 mg IV Lasix daily. For his atypical chest pain, will rule out with serial troponins. His home medication will be continued. For his diabetes will use insulin sliding scale as needed. He will be continued on his medication for his gout as well. He is stable, full code, and will be admitted to [facility]. Thank you for allowing me to partake in the care of this nice patient.  Other plans as per orders.  Code Status: FULL CODE. And   Houston Siren, MD. [facility] Pager [phone] 7pm to 7am.  11/20/2012, 10:04 PM

## 2012-11-20 NOTE — ED Notes (Signed)
Pt reports to the ED for eval of chest tightness with exertion. Pt denies any radiation or associated symptoms. Pt denies any chest tightness at this time. Pt has recent hx of hip replacement in March. Pt was in rehab at [facility] and obtained a UTI which developed into urospesis. Pt was admitted to [facility] on Tuesday of last week and was d/c home. Pts family reports increased fluid retention that began today. Per EMS pt has LLL crackles and pt appears tachypnic. Pt had 500 mg of Tylenol at 1730. Pt A&O x4. Pt has foley in place. Clear yellow urine draining.

## 2012-11-20 NOTE — ED Provider Notes (Signed)
History     CSN: [id number]  Arrival date & time 11/20/12  1941   First MD Initiated Contact with Patient 11/20/12 1943      Chief Complaint  Patient presents with  . Chest Pain    (Consider location/radiation/quality/duration/timing/severity/associated sxs/prior treatment) HPI Comments: Patient presents via EMS with 10 minute episode of chest tightness that is now resolved. He was associated with shortness of breath. He was discharged yesterday after prolonged hospitalization for renal failure, GI bleed encephalopathy. He had a hip replacement March 13. Patient denies any chest pain at this time. Denies shortness of breath though he appears dyspneic. Denies any cough or fever. Family reports increasing leg swelling over the past several days and dyspnea on exertion. No fever, abdominal pain, nausea or vomiting. Anticoagulation was stopped.  The history is provided by the patient and the EMS personnel.    Past Medical History  Diagnosis Date  . Gout   . Insomnia   . BPH (benign prostatic hyperplasia)   . Kidney disorder   . Arthritis   . Hypertension     patient denies on 10/25/12 on no meds   . Complication of anesthesia     hard to wake up -2011 after hernia surgery     Past Surgical History  Procedure Laterality Date  . Cataract extraction    . Cholecystectomy    . Hernia repair  02/22/11    right  . Joint replacement      fractured hip  . Total hip arthroplasty Right 10/29/2012    Procedure: CONVERSION OF PREVIOUS HIP SURGERY TO RIGHT TOTAL HIP AND REMOVAL OF IM NAIL ;  Surgeon: Shelda Pal, MD;  Location: [facility];  Service: Orthopedics;  Laterality: Right;  . Esophagogastroduodenoscopy N/A 11/16/2012    Procedure: ESOPHAGOGASTRODUODENOSCOPY (EGD);  Surgeon: Theda Belfast, MD;  Location: [facility];  Service: Endoscopy;  Laterality: N/A;    Family History  Problem Relation Age of Onset  . Heart failure Mother   . Heart failure Father   . Diabetes Neg Hx      History  Substance Use Topics  . Smoking status: Never Smoker   . Smokeless tobacco: Never Used  . Alcohol Use: No      Review of Systems  Constitutional: Positive for activity change, appetite change and fatigue. Negative for fever.  HENT: Negative for congestion and rhinorrhea.   Respiratory: Positive for chest tightness and shortness of breath. Negative for cough.   Cardiovascular: Positive for chest pain.  Gastrointestinal: Negative for nausea, vomiting and abdominal pain.  Genitourinary: Negative for dysuria and hematuria.  Musculoskeletal: Negative for back pain.  Skin: Negative for rash.  Neurological: Positive for weakness. Negative for dizziness and headaches.  A complete 10 system review of systems was obtained and all systems are negative except as noted in the HPI and PMH.    Allergies  Nsaids  Home Medications   No current outpatient prescriptions on file.  BP 123/64  Pulse 89  Temp(Src) 98.9 F (37.2 C) (Oral)  Resp 18  Ht 5\' 6"  (1.676 m)  Wt 144 lb 14.4 oz (65.726 kg)  BMI 23.4 kg/m2  SpO2 99%  Physical Exam  Constitutional: He is oriented to person, place, and time. He appears well-developed and well-nourished. He appears distressed.  Mild respiratory distress  HENT:  Head: Normocephalic.  Mouth/Throat: Oropharynx is clear and moist. No oropharyngeal exudate.  Eyes: Conjunctivae and EOM are normal. Pupils are equal, round, and reactive to light.  Neck: Normal range of motion. Neck supple.  Cardiovascular: Normal rate, regular rhythm and normal heart sounds.   No murmur heard. Pulmonary/Chest: Effort normal. No respiratory distress. He has rales.  Bibasilar crackles  Abdominal: Soft. There is no tenderness. There is no rebound and no guarding.  Musculoskeletal: He exhibits edema.  +3 pitting edema to knees bilaterally  Neurological: He is alert and oriented to person, place, and time. No cranial nerve deficit. He exhibits normal muscle tone.  Coordination normal.  Skin: Skin is warm.    ED Course  Procedures (including critical care time)  Labs Reviewed  CBC WITH DIFFERENTIAL - Abnormal; Notable for the following:    RBC 3.30 (*)    Hemoglobin 9.5 (*)    HCT 27.9 (*)    Neutrophils Relative 79 (*)    Neutro Abs 8.0 (*)    Lymphocytes Relative 10 (*)    All other components within normal limits  COMPREHENSIVE METABOLIC PANEL - Abnormal; Notable for the following:    Potassium 3.4 (*)    Glucose, Bld 135 (*)    Creatinine, Ser 1.40 (*)    Calcium 7.8 (*)    Total Protein 5.2 (*)    Albumin 2.3 (*)    AST 50 (*)    GFR calc non Af Amer 44 (*)    GFR calc Af Amer 51 (*)    All other components within normal limits  PRO B NATRIURETIC PEPTIDE - Abnormal; Notable for the following:    Pro B Natriuretic peptide (BNP) 2537.0 (*)    All other components within normal limits  TROPONIN I  PROTIME-INR  TROPONIN I  TYPE AND SCREEN  ABO/RH   Dg Chest 2 View  11/20/2012  *RADIOLOGY REPORT*  Clinical Data: Short of breath  CHEST - 2 VIEW  Comparison: 11/14/2012  Findings: Normal heart size.  Low volumes.  Bilateral pleural effusions left greater than right.  Bibasilar patchy opacities are present left greater than right.  Pulmonary vascularity is within normal limits.  No pneumothorax.  IMPRESSION: Bilateral pleural effusions and bibasilar atelectasis verses airspace disease left greater than right.   Original Report Authenticated By: Jolaine Click, M.D.      1. Atypical chest pain   2. Anemia   3. Unspecified essential hypertension   4. Volume overload       MDM  Episode of chest pain and shortness of breath now resolved with recent hospitalization. Patient received significant IVF and blood products during last admission. Patient denies any further chest pain, blood in stool, cough or congestion.  Exam shows mild time overload with tachypnea and bibasilar crackles. Lower extremity edema.  Patient given IV Lasix with  good response. Chest x-ray shows minimal effusions. Creatinine is stable, hemoglobin stable.  Discussed with Dr. Nedra Hai who is seen patient. Family prefers admission for continued diuresis.   Date: 11/20/2012  Rate: 87  Rhythm: normal sinus rhythm  QRS Axis: normal  Intervals: normal  ST/T Wave abnormalities: normal  Conduction Disutrbances:none  Narrative Interpretation:   Old EKG Reviewed: unchanged          Glynn Octave, MD 11/20/12 2351

## 2012-11-21 DIAGNOSIS — E875 Hyperkalemia: Secondary | ICD-10-CM

## 2012-11-21 DIAGNOSIS — D649 Anemia, unspecified: Secondary | ICD-10-CM

## 2012-11-21 DIAGNOSIS — R0789 Other chest pain: Secondary | ICD-10-CM

## 2012-11-21 DIAGNOSIS — E8779 Other fluid overload: Secondary | ICD-10-CM

## 2012-11-21 LAB — BASIC METABOLIC PANEL
BUN: 13 mg/dL (ref 6–23)
Chloride: 105 mEq/L (ref 96–112)
Creatinine, Ser: 1.29 mg/dL (ref 0.50–1.35)
Glucose, Bld: 90 mg/dL (ref 70–99)
Potassium: 3.2 mEq/L — ABNORMAL LOW (ref 3.5–5.1)

## 2012-11-21 LAB — TROPONIN I: Troponin I: <0.3 ng/mL (ref <0.30)

## 2012-11-21 MED ORDER — POTASSIUM CHLORIDE CRYS ER 20 MEQ PO TBCR
40.0000 meq | EXTENDED_RELEASE_TABLET | Freq: Once | ORAL | Status: AC
  Administered 2012-11-21: 40 meq via ORAL
  Filled 2012-11-21: qty 2

## 2012-11-21 NOTE — Progress Notes (Signed)
Clinical Social Work Department BRIEF PSYCHOSOCIAL ASSESSMENT 11/21/2012  Patient:  NAZAIRE, CORDIAL     Account Number:  [id number]     Admit date:  11/20/2012  Clinical Social Worker:  Kirke Shaggy  Date/Time:  11/21/2012 03:59 PM  Referred by:  Physician  Date Referred:  11/21/2012 Referred for  SNF Placement   Other Referral:   Interview type:  Family Other interview type:    PSYCHOSOCIAL DATA Living Status:  FAMILY Admitted from facility:  [facility] Level of care:  Skilled Nursing Facility Primary support name:  Andrey Campanile Primary support relationship to patient:  CHILD, ADULT Degree of support available:   good, but the daughters are tired and don't think that they could take care of their father at the level that he needs right now.    CURRENT CONCERNS Current Concerns  Post-Acute Placement   Other Concerns:    SOCIAL WORK ASSESSMENT / PLAN Met with family members to chat about placement to another SNF.   Assessment/plan status:   Other assessment/ plan:   Information/referral to community resources:    PATIENT'S/FAMILY'S RESPONSE TO PLAN OF CARE: The plan is for the pt to go to a SNF for post-acute d/c. The dtrs and pt are in agreement for SNF placement.  The first choice is [address], [place] and then [employer]. the paperwork will be sent out for SNF placement.   Sherald Barge, LCSW-A Clinical Social Worker [phone]

## 2012-11-21 NOTE — Progress Notes (Signed)
Utilization review completed.  

## 2012-11-21 NOTE — Progress Notes (Signed)
[facility] PROGRESS NOTE  [name] ZOX:[id number] DOB: [birth year] DOA: 11/20/2012 PCP: Verneita Griffes, MD  Assessment/Plan: 1. Atypical chest pain - Cardiac Enzymes negative x 3 - Will order echocardiogram to further evaluate - Has completely resolved - VSS  2. Hypokalemia - will replace orally and reassess - Likely related to recent lasix administration  3. Fluid overloaded - Echo cardiogram to assess for CHF - initial BNP was 2537 - Strict I/O's, fluid restriction, daily weights. - Has lost > 3 liters - continue lasix 40 mg IV with plans to switch to oral regimen next am.  4. Anemia - stable, no active bleeding  5.  DVT prophylaxis: Heparin  Code Status: full Family Communication: Discussed with daughters and patient.  Disposition Plan: Will place order for physical therapy evaluation with plans to transition to SNF if possible given that family does not feel comfortable taking patient home.   Consultants:  Child psychotherapist  Procedures:  none  Antibiotics:  none   HPI/Subjective: Patient at this juncture has no new complaints.  He denies any chest pain.  He states that he is not quite sure why he is in the hospital.  Daughter still feel uncomfortable taking patient home due to his comorbidities.    Objective: Filed Vitals:   11/20/12 2200 11/20/12 2250 11/20/12 2337 11/21/12 0500  BP: 112/54 105/52 123/64 125/68  Pulse: 80 90 89 81  Temp:   98.9 F (37.2 C) 98.4 F (36.9 C)  TempSrc:      Resp: 18 18 18 18   Height:   5\' 6"  (1.676 m)   Weight:   65.726 kg (144 lb 14.4 oz)   SpO2: 99% 98% 99% 99%    Intake/Output Summary (Last 24 hours) at 11/21/12 1029 Last data filed at 11/21/12 0900  Gross per 24 hour  Intake    240 ml  Output   3501 ml  Net  -3261 ml   Filed Weights   11/20/12 2337  Weight: 65.726 kg (144 lb 14.4 oz)    Exam:   General:  Pt in NAD, Alert and Awake, sitting up in bed smiling  Cardiovascular: RRR, No  MRG  Respiratory: breathing comfortably in room air, speaking in full sentences, no wheezes  Abdomen: soft, NT, ND  Musculoskeletal: no cyanosis or clubbing,   Data Reviewed: Basic Metabolic Panel:  Recent Labs Lab 11/16/12 0534 11/16/12 2218 11/17/12 0510 11/18/12 0447 11/19/12 0437 11/20/12 1953 11/21/12 0755  NA 140  --  140 139 139 135 139  K 3.3*  --  3.2* 3.9 3.9 3.4* 3.2*  CL 111  --  112 111 110 105 105  CO2 18*  --  18* 20 20 19 22   GLUCOSE 97  --  133* 121* 99 135* 90  BUN 38*  --  29* 19 15 11 13   CREATININE 2.14*  --  1.90* 1.45* 1.32 1.40* 1.29  CALCIUM 7.9*  --  7.8* 7.9* 8.1* 7.8* 8.1*  MG  --  1.6  --   --   --   --   --    Liver Function Tests:  Recent Labs Lab 11/20/12 1953  AST 50*  ALT 36  ALKPHOS 115  BILITOT 0.4  PROT 5.2*  ALBUMIN 2.3*   No results found for this basename: LIPASE, AMYLASE,  in the last 168 hours No results found for this basename: AMMONIA,  in the last 168 hours CBC:  Recent Labs Lab 11/16/12 0534 11/17/12 0510 11/18/12 0447 11/19/12  1610 11/20/12 1953  WBC 5.5 8.5 9.3 7.6 10.1  NEUTROABS  --   --   --   --  8.0*  HGB 9.0* 8.9* 9.7* 8.9* 9.5*  HCT 26.6* 27.5* 29.4* 27.4* 27.9*  MCV 86.1 88.4 88.0 88.4 84.5  PLT 211 220 213 183 177   Cardiac Enzymes:  Recent Labs Lab 11/20/12 1954 11/20/12 2201 11/21/12 0325  TROPONINI <0.30 <0.30 <0.30   BNP (last 3 results)  Recent Labs  11/14/12 0800 11/20/12 1954  PROBNP 1488.0* 2537.0*   CBG:  Recent Labs Lab 11/15/12 0821  GLUCAP 101*    Recent Results (from the past 240 hour(s))  MRSA PCR SCREENING     Status: None   Collection Time    11/14/12 12:54 AM      Result Value Range Status   MRSA by PCR NEGATIVE  NEGATIVE Final   Comment:            The GeneXpert MRSA Assay (FDA     approved for NASAL specimens     only), is one component of a     comprehensive MRSA colonization     surveillance program. It is not     intended to diagnose MRSA      infection nor to guide or     monitor treatment for     MRSA infections.  URINE CULTURE     Status: None   Collection Time    11/14/12  1:18 AM      Result Value Range Status   Specimen Description URINE, CATHETERIZED   Final   Special Requests NONE   Final   Culture  Setup Time 11/14/2012 05:48   Final   Colony Count NO GROWTH   Final   Culture NO GROWTH   Final   Report Status 11/15/2012 FINAL   Final     Studies: Dg Chest 2 View  11/20/2012  *RADIOLOGY REPORT*  Clinical Data: Short of breath  CHEST - 2 VIEW  Comparison: 11/14/2012  Findings: Normal heart size.  Low volumes.  Bilateral pleural effusions left greater than right.  Bibasilar patchy opacities are present left greater than right.  Pulmonary vascularity is within normal limits.  No pneumothorax.  IMPRESSION: Bilateral pleural effusions and bibasilar atelectasis verses airspace disease left greater than right.   Original Report Authenticated By: Jolaine Click, M.D.     Scheduled Meds: . acetaminophen  500 mg Oral Once  . allopurinol  100 mg Oral q morning - 10a  . docusate sodium  100 mg Oral BID  . ferrous sulfate  325 mg Oral q morning - 10a  . furosemide  40 mg Intravenous Daily  . heparin  5,000 Units Subcutaneous Q8H  . pantoprazole  40 mg Oral Daily  . polyethylene glycol  17 g Oral BID  . potassium chloride  40 mEq Oral Once  . sodium chloride  3 mL Intravenous Q12H  . tamsulosin  0.4 mg Oral QPC supper   Continuous Infusions:   Principal Problem:   Atypical chest pain Active Problems:   THROMBOCYTOPENIA   HYPERTENSION   Anemia   Hypokalemia   Volume overload    Time spent: > 35 minutes    Penny Pia  [facility] Pager [phone]. If 7PM-7AM, please contact night-coverage at [email or web address], password [facility] 11/21/2012, 10:29 AM  LOS: 1 day

## 2012-11-22 DIAGNOSIS — N179 Acute kidney failure, unspecified: Secondary | ICD-10-CM

## 2012-11-22 DIAGNOSIS — E876 Hypokalemia: Secondary | ICD-10-CM

## 2012-11-22 DIAGNOSIS — I10 Essential (primary) hypertension: Secondary | ICD-10-CM

## 2012-11-22 DIAGNOSIS — I519 Heart disease, unspecified: Secondary | ICD-10-CM

## 2012-11-22 MED ORDER — ACETAMINOPHEN 325 MG PO TABS
650.0000 mg | ORAL_TABLET | Freq: Four times a day (QID) | ORAL | Status: DC | PRN
  Administered 2012-11-22 (×2): 650 mg via ORAL
  Filled 2012-11-22 (×2): qty 2

## 2012-11-22 MED ORDER — POTASSIUM CHLORIDE CRYS ER 20 MEQ PO TBCR: 40.0000 meq | EXTENDED_RELEASE_TABLET | Freq: Once | ORAL | Status: DC

## 2012-11-22 MED ORDER — FUROSEMIDE 40 MG PO TABS
40.0000 mg | ORAL_TABLET | Freq: Every day | ORAL | Status: DC
  Administered 2012-11-23: 40 mg via ORAL
  Filled 2012-11-22: qty 1

## 2012-11-22 MED ORDER — POTASSIUM CHLORIDE CRYS ER 20 MEQ PO TBCR
40.0000 meq | EXTENDED_RELEASE_TABLET | Freq: Once | ORAL | Status: AC
  Administered 2012-11-22: 40 meq via ORAL
  Filled 2012-11-22: qty 2

## 2012-11-22 NOTE — Progress Notes (Signed)
Clinical Social Work Department BRIEF PSYCHOSOCIAL ASSESSMENT 11/22/2012  Patient:  [name], [name]     Account Number:  [id number]     Admit date:  11/20/2012  Clinical Social Worker:  Kirke Shaggy  Date/Time:  11/22/2012 03:40 PM  Referred by:  Physician  Date Referred:  11/21/2012 Referred for  SNF Placement   Other Referral:   Interview type:  Family Other interview type:    PSYCHOSOCIAL DATA Living Status:  ALONE Admitted from [name] Level of care:  Skilled Nursing Facility Primary support name:  [name] [phone] Primary support relationship to patient:  CHILD, ADULT Degree of support available:   good    CURRENT CONCERNS Current Concerns  Post-Acute Placement   Other Concerns:    SOCIAL WORK ASSESSMENT / PLAN Met with pt and family at bedside to discuss post-acute d/c. Gave family the list of facilities. Pt and family are in agreement for SNF placement. Sent out for bed search.   Assessment/plan status:   Other assessment/ plan:   Information/referral to community resources:   Gave family a list of the SNF's in a 50 mile radius.    PATIENT'S/FAMILY'S RESPONSE TO PLAN OF CARE: Bed offers were made for this pt.  Received a phone call from [facility] for SNF placement which was the family's first choice.  Family and pt are in agreement to accept bed offer for [name] for d/c on Friday 11/24/12.   Sherald Barge, LCSW-A Clinical Social Worker [phone]

## 2012-11-22 NOTE — Progress Notes (Signed)
  Echocardiogram 2D Echocardiogram has been performed.  [name] A 11/22/2012, 12:00 PM

## 2012-11-22 NOTE — Progress Notes (Signed)
[facility] PROGRESS NOTE  [name] ZOX:[id number] DOB: 09/30/1924 DOA: 11/20/2012 PCP: Verneita Griffes, MD  Assessment/Plan: 1. Atypical chest pain - Cardiac Enzymes negative x 3 - F/u with Echocardiogram completed today 4/3 - Has completely resolved - VSS  2. Hypokalemia - will replace orally and reassess - Likely related to recent lasix administration - BMP next am  3. Fluid overloaded - Echo cardiogram to assess for CHF - initial BNP was 2537 - Strict I/O's, fluid restriction, daily weights. - Has lost > 7 liters - continue lasix but will place order to transition to oral regimen.  4. Anemia - stable, no active bleeding  5.  DVT prophylaxis: Heparin  Code Status: full Family Communication: Discussed with daughters and patient.  Disposition Plan: Will place order for physical therapy evaluation with plans to transition to SNF if possible given that family does not feel comfortable taking patient home.   Consultants:  Social worker  Procedures:  Echocardiogram  Antibiotics:  none   HPI/Subjective: Patient at this juncture has no new complaints.  He denies any chest pain.    Objective: Filed Vitals:   11/21/12 2013 11/22/12 0535 11/22/12 0930 11/22/12 1000  BP: 115/59 131/52  101/57  Pulse: 92 97    Temp: 99.2 F (37.3 C) 100.2 F (37.9 C)    TempSrc: Oral Oral    Resp: 20 20    Height:      Weight:  61.326 kg (135 lb 3.2 oz)    SpO2: 98% 98% 99%     Intake/Output Summary (Last 24 hours) at 11/22/12 1457 Last data filed at 11/22/12 1100  Gross per 24 hour  Intake    480 ml  Output   3950 ml  Net  -3470 ml   Filed Weights   11/20/12 2337 11/22/12 0535  Weight: 65.726 kg (144 lb 14.4 oz) 61.326 kg (135 lb 3.2 oz)    Exam:   General:  Pt in NAD, Alert and Awake, sitting up in bed smiling  Cardiovascular: RRR, No MRG  Respiratory: breathing comfortably in room air, speaking in full sentences, no wheezes  Abdomen: soft,  NT, ND  Musculoskeletal: no cyanosis or clubbing,   Data Reviewed: Basic Metabolic Panel:  Recent Labs Lab 11/16/12 0534 11/16/12 2218 11/17/12 0510 11/18/12 0447 11/19/12 0437 11/20/12 1953 11/21/12 0755  NA 140  --  140 139 139 135 139  K 3.3*  --  3.2* 3.9 3.9 3.4* 3.2*  CL 111  --  112 111 110 105 105  CO2 18*  --  18* 20 20 19 22   GLUCOSE 97  --  133* 121* 99 135* 90  BUN 38*  --  29* 19 15 11 13   CREATININE 2.14*  --  1.90* 1.45* 1.32 1.40* 1.29  CALCIUM 7.9*  --  7.8* 7.9* 8.1* 7.8* 8.1*  MG  --  1.6  --   --   --   --   --    Liver Function Tests:  Recent Labs Lab 11/20/12 1953  AST 50*  ALT 36  ALKPHOS 115  BILITOT 0.4  PROT 5.2*  ALBUMIN 2.3*   No results found for this basename: LIPASE, AMYLASE,  in the last 168 hours No results found for this basename: AMMONIA,  in the last 168 hours CBC:  Recent Labs Lab 11/16/12 0534 11/17/12 0510 11/18/12 0447 11/19/12 0437 11/20/12 1953  WBC 5.5 8.5 9.3 7.6 10.1  NEUTROABS  --   --   --   --  8.0*  HGB 9.0* 8.9* 9.7* 8.9* 9.5*  HCT 26.6* 27.5* 29.4* 27.4* 27.9*  MCV 86.1 88.4 88.0 88.4 84.5  PLT 211 220 213 183 177   Cardiac Enzymes:  Recent Labs Lab 11/20/12 1954 11/20/12 2201 11/21/12 0325 11/21/12 0940 11/21/12 1649  TROPONINI <0.30 <0.30 <0.30 <0.30 <0.30   BNP (last 3 results)  Recent Labs  11/14/12 0800 11/20/12 1954  PROBNP 1488.0* 2537.0*   CBG: No results found for this basename: GLUCAP,  in the last 168 hours  Recent Results (from the past 240 hour(s))  MRSA PCR SCREENING     Status: None   Collection Time    11/14/12 12:54 AM      Result Value Range Status   MRSA by PCR NEGATIVE  NEGATIVE Final   Comment:            The GeneXpert MRSA Assay (FDA     approved for NASAL specimens     only), is one component of a     comprehensive MRSA colonization     surveillance program. It is not     intended to diagnose MRSA     infection nor to guide or     monitor treatment for      MRSA infections.  URINE CULTURE     Status: None   Collection Time    11/14/12  1:18 AM      Result Value Range Status   Specimen Description URINE, CATHETERIZED   Final   Special Requests NONE   Final   Culture  Setup Time 11/14/2012 05:48   Final   Colony Count NO GROWTH   Final   Culture NO GROWTH   Final   Report Status 11/15/2012 FINAL   Final     Studies: Dg Chest 2 View  11/20/2012  *RADIOLOGY REPORT*  Clinical Data: Short of breath  CHEST - 2 VIEW  Comparison: 11/14/2012  Findings: Normal heart size.  Low volumes.  Bilateral pleural effusions left greater than right.  Bibasilar patchy opacities are present left greater than right.  Pulmonary vascularity is within normal limits.  No pneumothorax.  IMPRESSION: Bilateral pleural effusions and bibasilar atelectasis verses airspace disease left greater than right.   Original Report Authenticated By: Jolaine Click, M.D.     Scheduled Meds: . allopurinol  100 mg Oral q morning - 10a  . docusate sodium  100 mg Oral BID  . ferrous sulfate  325 mg Oral q morning - 10a  . furosemide  40 mg Intravenous Daily  . heparin  5,000 Units Subcutaneous Q8H  . pantoprazole  40 mg Oral Daily  . polyethylene glycol  17 g Oral BID  . sodium chloride  3 mL Intravenous Q12H  . tamsulosin  0.4 mg Oral QPC supper   Continuous Infusions:   Principal Problem:   Atypical chest pain Active Problems:   HYPERTENSION   Anemia   Hypokalemia   Volume overload    Time spent: > 35 minutes    [name]  [facility] Pager [phone]. If 7PM-7AM, please contact night-coverage at [email or web address], password [facility] 11/22/2012, 2:57 PM  LOS: 2 days

## 2012-11-22 NOTE — Evaluation (Signed)
Physical Therapy Evaluation Patient Details Name: [name] MRN: [id number] DOB: Dec 11, 1924 Today's Date: 11/22/2012 Time: 0922-1000 PT Time Calculation (min): 38 min  PT Assessment / Plan / Recommendation Clinical Impression  79 y/o male recently d/c'd from [facility] following hip revision, septic shock and enemia. Readmitted to [facility] for chest pain. Presents to PT today with below impairments affecting functional independence. Will benefit physical therapy in the acute setting to decrease burden of care at next venue. Agree ST-SNF is the best plan at this point as he is very deconditioned and will require significant help on d/c.     PT Assessment  Patient needs continued PT services    Follow Up Recommendations  SNF    Does the patient have the potential to tolerate intense rehabilitation      Barriers to Discharge        Equipment Recommendations  None recommended by PT    Recommendations for Other Services     Frequency      Precautions / Restrictions Precautions Precautions: Fall;Posterior Hip Precaution Comments: only able to recall 1/3 hip precautions, daughter couldn't recall any Restrictions RLE Weight Bearing: Weight bearing as tolerated (per recent orthopedic note)  Pertinent Vitals/Pain Denies pain, did become rather dyspneic with ambulation 2-3/4 but SpO2 99% on RA      Mobility  Bed Mobility Bed Mobility: Supine to Sit Supine to Sit: 4: Min guard;With rails;HOB elevated (30 degrees) Details for Bed Mobility Assistance: cueing for safe technique, cues for hip precautions Transfers Transfers: Sit to Stand;Stand to Sit Sit to Stand: 3: Mod assist;With upper extremity assist;With armrests;From chair/3-in-1;From bed Stand to Sit: 3: Mod assist;With upper extremity assist;With armrests;To bed;To chair/3-in-1 Details for Transfer Assistance: cues for safe technique and hip precautions, facilitation for anterior translation of trunk over BOS and  assist for power up/controlled descent Ambulation/Gait Ambulation/Gait Assistance: 4: Min assist Ambulation Distance (Feet): 20 Feet Assistive device: Rolling walker Ambulation/Gait Assistance Details: ambulated to and from the bathroom (10 ft either direction) with seated rest in between, assist to maneuver RW in tight spaces, cues for tall posture and stability assist Gait Pattern: Step-through pattern;Decreased stride length;Trunk flexed General Gait Details: weak appearing gait    Exercises     PT Diagnosis: Abnormality of gait;Generalized weakness;Difficulty walking  PT Problem List: Decreased strength;Decreased range of motion;Decreased activity tolerance;Decreased mobility;Decreased balance;Decreased knowledge of use of DME;Decreased knowledge of precautions PT Treatment Interventions: DME instruction;Gait training;Functional mobility training;Therapeutic activities;Therapeutic exercise;Patient/family education;Balance training   PT Goals Acute Rehab PT Goals PT Goal Formulation: With patient Time For Goal Achievement: 11/29/12 Potential to Achieve Goals: Good Pt will go Supine/Side to Sit: with modified independence PT Goal: Supine/Side to Sit - Progress: Goal set today Pt will go Sit to Supine/Side: with modified independence PT Goal: Sit to Supine/Side - Progress: Goal set today Pt will go Sit to Stand: with supervision PT Goal: Sit to Stand - Progress: Goal set today Pt will Ambulate: 51 - 150 feet;with supervision;with least restrictive assistive device PT Goal: Ambulate - Progress: Goal set today Pt will Perform Home Exercise Program: with supervision, verbal cues required/provided PT Goal: Perform Home Exercise Program - Progress: Goal set today  Visit Information  Last PT Received On: 11/22/12 Assistance Needed: +1    Subjective Data  Subjective: I don't think they could find that commode.  Patient Stated Goal: family wants rehab   Prior Functioning  Home  Living Lives With: Spouse Available Help at Discharge: Available 24 hours/day;Family Type  of Home: House Home Access: Stairs to enter Entrance Stairs-Rails: Right;Left Home Layout: One level Bathroom Shower/Tub: Health visitor: Standard Home Adaptive Equipment: Grab bars in shower;Grab bars around toilet;Wheelchair - manual;Walker - rolling;Bedside commode/3-in-1;Tub transfer bench;Straight cane Prior Function Vocation: Retired Comments: was needing extra help at home  Communication Communication: No difficulties    Cognition  Cognition Overall Cognitive Status: Appears within functional limits for tasks assessed/performed Arousal/Alertness: Awake/alert Orientation Level: Appears intact for tasks assessed Behavior During Session: [facility] for tasks performed    Extremity/Trunk Assessment Right Upper Extremity Assessment RUE ROM/Strength/Tone: [facility] for tasks assessed Left Upper Extremity Assessment LUE ROM/Strength/Tone: WFL for tasks assessed Right Lower Extremity Assessment RLE ROM/Strength/Tone Deficits: grossly 2+/5 hip flexor strength, 3/5 knee extrension Left Lower Extremity Assessment LLE ROM/Strength/Tone: WFL for tasks assessed Trunk Assessment Trunk Assessment: Kyphotic   Balance    End of Session PT - End of Session Equipment Utilized During Treatment: Gait belt Activity Tolerance: Patient tolerated treatment well Patient left: in chair;with call bell/phone within reach Nurse Communication: Mobility status  GP Functional Limitation: Mobility: Walking and moving around Mobility: Walking and Moving Around Current Status ([id number]): At least 20 percent but less than 40 percent impaired, limited or restricted Mobility: Walking and Moving Around Goal Status [phone]): 0 percent impaired, limited or restricted   [facility] HELEN 11/22/2012, 10:32 AM

## 2012-11-23 DIAGNOSIS — I509 Heart failure, unspecified: Secondary | ICD-10-CM

## 2012-11-23 DIAGNOSIS — I5031 Acute diastolic (congestive) heart failure: Secondary | ICD-10-CM

## 2012-11-23 LAB — BASIC METABOLIC PANEL
CO2: 26 mEq/L (ref 19–32)
Chloride: 105 mEq/L (ref 96–112)
Glucose, Bld: 90 mg/dL (ref 70–99)
Potassium: 4 mEq/L (ref 3.5–5.1)
Sodium: 138 mEq/L (ref 135–145)

## 2012-11-23 MED ORDER — POTASSIUM CHLORIDE ER 10 MEQ PO TBCR: 10.0000 meq | EXTENDED_RELEASE_TABLET | Freq: Two times a day (BID) | ORAL | Status: DC

## 2012-11-23 MED ORDER — FUROSEMIDE 20 MG PO TABS: 20.0000 mg | ORAL_TABLET | Freq: Every day | ORAL | Status: DC

## 2012-11-23 NOTE — Care Management Note (Signed)
    Page 1 of 1   11/23/2012     11:29:48 AM   CARE MANAGEMENT NOTE 11/23/2012  Patient:  [name], [name]   Account Number:  [id number]  Date Initiated:  11/23/2012  Documentation initiated by:  GRAVES-BIGELOW,Dianna Ewald  Subjective/Objective Assessment:   Pt admitted with CP. Plan for d/c today to [facility].     Action/Plan:   No needs from Cm at this time. CSW assisting with disposition needs. Cm did speak to [facility] place and he has a 3 day qualifying case based on last admission at [facility] for LOS of 6 days.   Anticipated DC Date:  11/23/2012   Anticipated DC Plan:  SKILLED NURSING FACILITY  In-house referral  Clinical Social Worker      DC Planning Services  CM consult      Choice offered to / List presented to:             Status of service:  Completed, signed off Medicare Important Message given?   (If response is "NO", the following Medicare IM given date fields will be blank) Date Medicare IM given:   Date Additional Medicare IM given:    Discharge Disposition:  SKILLED NURSING FACILITY  Per UR Regulation:  Reviewed for med. necessity/level of care/duration of stay  If discussed at Long Length of Stay Meetings, dates discussed:    Comments:

## 2012-11-23 NOTE — Discharge Summary (Addendum)
Physician Discharge Summary  BRAIDAN RICCIARDI WUJ:[id number] DOB: 1925/05/07 DOA: 11/20/2012  PCP: Verneita Griffes, MD  Admit date: 11/20/2012 Discharge date: 11/23/2012  Time spent: > 35 minutes  Recommendations for Outpatient Follow-up:  1. You will need to follow up with blood pressures and potassium levels since patient was recently started on lasix. 2. Also assess whether or not patient will require more diuretics for treatment of his diastolic heart failure  Discharge Diagnoses:  Principal Problem:   Atypical chest pain Active Problems:   HYPERTENSION   Anemia   Hypokalemia   Volume overload   Discharge Condition: Stable  Diet recommendation: Heart healthy  Filed Weights   11/20/12 2337 11/22/12 0535 11/23/12 0409  Weight: 65.726 kg (144 lb 14.4 oz) 61.326 kg (135 lb 3.2 oz) 58.559 kg (129 lb 1.6 oz)    History of present illness:  Please refer to admission H and P for further details  Patient is an 79 y/o CM who had recent admission for AKI, BL hydronephrosis, hypovolemic shock and was discharged to home after resolution of above problems.  Presented to ED with chest tightness with elevated BNP.  Hospital Course:  1. Atypical chest pain - Cardiac Enzymes negative x 3  - F/u with Echocardiogram interpreted as normal EF of 60-65% and with grade 1 diastolic dysfunction. - Has completely resolved  - VSS   2. Hypokalemia  - resolved after oral replacement - Likely related to recent lasix administration  - BMP next am  - d/c on kdur 10 meq daily while on lasix.  3. Fluid overloaded  - Dyastolic CHF based on Echocardiogram - initial BNP was 2537  - Strict I/O's, fluid restriction, daily weights.  - Has lost > 8 liters  - continue lasix but will place order to transition to oral regimen.   4. Anemia  - stable, no active bleeding   5. DVT prophylaxis: Was on Heparin while in house.  Procedures:  2 D echocardiogram  Consultations:  None  Discharge  Exam: Filed Vitals:   11/22/12 1000 11/22/12 1500 11/22/12 2100 11/23/12 0409  BP: 101/57 110/51 114/67 108/64  Pulse:  90 79 74  Temp:  98.4 F (36.9 C) 98.9 F (37.2 C) 98.1 F (36.7 C)  TempSrc:  Oral Oral Oral  Resp:  20 20 20   Height:      Weight:    58.559 kg (129 lb 1.6 oz)  SpO2:  99% 95% 96%    General: Pt in NAD, Alert and Awake Cardiovascular: RRR, No MRG Respiratory: CTA BL, no wheezes.  Discharge Instructions  Discharge Orders   Future Appointments Provider Department Dept Phone   04/11/2013 1:00 PM Gordy Savers, MD [facility] [phone]   Future Orders Complete By Expires     (HEART FAILURE PATIENTS) Call MD:  Anytime you have any of the following symptoms: 1) 3 pound weight gain in 24 hours or 5 pounds in 1 week 2) shortness of breath, with or without a dry hacking cough 3) swelling in the hands, feet or stomach 4) if you have to sleep on extra pillows at night in order to breathe.  As directed     Call MD for:  severe uncontrolled pain  As directed     Call MD for:  temperature >100.4  As directed     Diet - low sodium heart healthy  As directed     Discharge instructions  As directed     Comments:  Please be sure to take medication as directed.  Also be sure to follow up with your primary care physician in 1-2 weeks or sooner should any new concerns arise.    Increase activity slowly  As directed         Medication List    TAKE these medications       acetaminophen 500 MG tablet  Commonly known as:  TYLENOL  Take 500 mg by mouth once.     allopurinol 100 MG tablet  Commonly known as:  ZYLOPRIM  Take 100 mg by mouth every morning.     DSS 100 MG Caps  Take 100 mg by mouth 2 (two) times daily.     ferrous sulfate 325 (65 FE) MG tablet  Take 325 mg by mouth every morning.     furosemide 20 MG tablet  Commonly known as:  LASIX  Take 1 tablet (20 mg total) by mouth daily.     pantoprazole 40 MG tablet  Commonly  known as:  PROTONIX  Take 1 tablet (40 mg total) by mouth daily.     polyethylene glycol packet  Commonly known as:  MIRALAX / GLYCOLAX  Take 17 g by mouth 2 (two) times daily.     potassium chloride 10 MEQ tablet  Commonly known as:  K-DUR  Take 1 tablet (10 mEq total) by mouth 2 (two) times daily.     tamsulosin 0.4 MG Caps  Commonly known as:  FLOMAX  Take 0.4 mg by mouth daily after supper.          The results of significant diagnostics from this hospitalization (including imaging, microbiology, ancillary and laboratory) are listed below for reference.    Significant Diagnostic Studies: Dg Chest 2 View  11/20/2012  *RADIOLOGY REPORT*  Clinical Data: Short of breath  CHEST - 2 VIEW  Comparison: 11/14/2012  Findings: Normal heart size.  Low volumes.  Bilateral pleural effusions left greater than right.  Bibasilar patchy opacities are present left greater than right.  Pulmonary vascularity is within normal limits.  No pneumothorax.  IMPRESSION: Bilateral pleural effusions and bibasilar atelectasis verses airspace disease left greater than right.   Original Report Authenticated By: Jolaine Click, M.D.    Dg Chest 2 View  10/29/2012  *RADIOLOGY REPORT*  Clinical Data: Preop for hip surgery  CHEST - 2 VIEW  Comparison: Portable chest x-ray of 04/07/2011  Findings: No active infiltrate or effusion is seen.  The heart is mildly enlarged and stable.  There are degenerative changes throughout the thoracic spine.  IMPRESSION: No active lung disease.  Mild cardiomegaly.   Original Report Authenticated By: Dwyane Dee, M.D.    US Renal  11/16/2012  *RADIOLOGY REPORT*  Clinical Data: Follow-up hydronephrosis.  RENAL/URINARY TRACT ULTRASOUND COMPLETE  Comparison:  11/14/2012  Findings:  Right Kidney:  10.7 cm.  2.6 cm right upper pole cyst.  Right hydronephrosis has increased since prior study.  Left Kidney:  11.2 cm.  Very limited visualization.  No definite hydronephrosis.  2.8 cm lower pole  hypoechoic area, likely cysts.  Bladder:  Foley catheter in place, decompressed.  IMPRESSION: Limited visualization of the kidneys.  There appears to be worsening right hydronephrosis and improving left hydronephrosis.   Original Report Authenticated By: Charlett Nose, M.D.    US Renal  11/14/2012  *RADIOLOGY REPORT*  Clinical Data: Acute renal failure  RENAL/URINARY TRACT ULTRASOUND COMPLETE  Comparison:  CT abdomen pelvis dated 11/11/2009  Findings:  Right Kidney:  Poorly visualized.  Measures  10.2 cm.  Mild hydronephrosis.  Left Kidney:  Poorly visualized.  Measures 9.9 cm.  1.7 x 2.3 x 2.2 cm lower pole cyst.  Mild hydronephrosis.  Bladder:  Decompressed by indwelling Foley catheter.  IMPRESSION: Bilateral kidneys are poorly visualized.  Mild bilateral hydronephrosis is suspected.  2.3 cm left lower pole renal cyst.   Original Report Authenticated By: Charline Bills, M.D.    Dg Pelvis Portable  10/29/2012  *RADIOLOGY REPORT*  Clinical Data: Status post right total hip replacement.  PORTABLE PELVIS  Comparison: 12/11/2011.  Findings: Interval removal of the previously demonstrated right femoral fixation hardware and placement of a right total hip prosthesis.  There is a fracture fragment medial to the proximal portion of the femoral component.  There is also lucency lateral to the femoral stem, proximally, which may be at least partially artifactual due to overlying soft tissue air.  A surgical drain and skin clips are also in place.  IMPRESSION: Interval right total hip prosthesis with a medial fracture fragment.   Original Report Authenticated By: Beckie Salts, M.D.    Portable Chest 1 View  11/14/2012  *RADIOLOGY REPORT*  Clinical Data: Bilateral basilar infiltrates.  PORTABLE CHEST - 1 VIEW  Comparison: 10/29/2012 chest radiograph.  Findings: Low lung volumes are present with elevation of the left hemidiaphragm greater than right.  Increasing atelectasis at the bases.  Superimposed airspace disease  may be present, particularly at the left lung base.  Right glenohumeral osteoarthritis is noted. Surgical clips in the right upper quadrant.  Cardiopericardial silhouette appears unchanged allowing for lower volumes of inspiration.  IMPRESSION: Low volume chest.  Left greater than right basilar atelectasis. Superimposed left lower lobe airspace disease cannot be excluded.   Original Report Authenticated By: Andreas Newport, M.D.    Dg Hip Portable 1 View Right  10/29/2012  *RADIOLOGY REPORT*  Clinical Data: Status post right total hip arthroplasty.  PORTABLE RIGHT HIP - 1 VIEW  Comparison: 12/11/2011.  Findings: Previously noted gamma nail fixation device has been removed and there has been interval total hip arthroplasty.  The lateral projection demonstrates proper location of the femoral head within the prosthetic prosthetic acetabular cup.  Gas is present in the overlying soft tissues.  The femoral stem and the acetabular cup and both appear to be well seated.  A surgical drain is in place in the upper five.  Overlying skin staples are noted.  IMPRESSION: Postoperative changes of right total hip arthroplasty without definite acute complicating features, as above.   Original Report Authenticated By: Trudie Reed, M.D.     Microbiology: Recent Results (from the past 240 hour(s))  MRSA PCR SCREENING     Status: None   Collection Time    11/14/12 12:54 AM      Result Value Range Status   MRSA by PCR NEGATIVE  NEGATIVE Final   Comment:            The GeneXpert MRSA Assay (FDA     approved for NASAL specimens     only), is one component of a     comprehensive MRSA colonization     surveillance program. It is not     intended to diagnose MRSA     infection nor to guide or     monitor treatment for     MRSA infections.  URINE CULTURE     Status: None   Collection Time    11/14/12  1:18 AM      Result Value Range Status  Specimen Description URINE, CATHETERIZED   Final   Special Requests  NONE   Final   Culture  Setup Time 11/14/2012 05:48   Final   Colony Count NO GROWTH   Final   Culture NO GROWTH   Final   Report Status 11/15/2012 FINAL   Final     Labs: Basic Metabolic Panel:  Recent Labs Lab 11/16/12 2218  11/18/12 0447 11/19/12 0437 11/20/12 1953 11/21/12 0755 11/22/12 1604 11/23/12 0456  NA  --   < > 139 139 135 139  --  138  K  --   < > 3.9 3.9 3.4* 3.2* 3.2* 4.0  CL  --   < > 111 110 105 105  --  105  CO2  --   < > 20 20 19 22   --  26  GLUCOSE  --   < > 121* 99 135* 90  --  90  BUN  --   < > 19 15 11 13   --  13  CREATININE  --   < > 1.45* 1.32 1.40* 1.29  --  1.16  CALCIUM  --   < > 7.9* 8.1* 7.8* 8.1*  --  8.0*  MG 1.6  --   --   --   --   --   --   --   < > = values in this interval not displayed. Liver Function Tests:  Recent Labs Lab 11/20/12 1953  AST 50*  ALT 36  ALKPHOS 115  BILITOT 0.4  PROT 5.2*  ALBUMIN 2.3*   No results found for this basename: LIPASE, AMYLASE,  in the last 168 hours No results found for this basename: AMMONIA,  in the last 168 hours CBC:  Recent Labs Lab 11/17/12 0510 11/18/12 0447 11/19/12 0437 11/20/12 1953  WBC 8.5 9.3 7.6 10.1  NEUTROABS  --   --   --  8.0*  HGB 8.9* 9.7* 8.9* 9.5*  HCT 27.5* 29.4* 27.4* 27.9*  MCV 88.4 88.0 88.4 84.5  PLT 220 213 183 177   Cardiac Enzymes:  Recent Labs Lab 11/20/12 1954 11/20/12 2201 11/21/12 0325 11/21/12 0940 11/21/12 1649  TROPONINI <0.30 <0.30 <0.30 <0.30 <0.30   BNP: BNP (last 3 results)  Recent Labs  11/14/12 0800 11/20/12 1954  PROBNP 1488.0* 2537.0*   CBG: No results found for this basename: GLUCAP,  in the last 168 hours     Signed:  Penny Pia  [facility] 11/23/2012, 10:49 AM  Addendum May have physical therapy 7 days of the as needed prior to most recent admission.

## 2012-11-23 NOTE — Progress Notes (Signed)
Pt will be going to [facility] today at 3pm via [employer].  No further CSW needs. Signing off of this pt.   Sherald Barge, LCSW-A Clinical Social Worker [phone]

## 2012-12-12 ENCOUNTER — Non-Acute Institutional Stay (SKILLED_NURSING_FACILITY): Payer: Medicare Other | Admitting: Adult Health

## 2012-12-12 DIAGNOSIS — D649 Anemia, unspecified: Secondary | ICD-10-CM

## 2012-12-12 DIAGNOSIS — N139 Obstructive and reflux uropathy, unspecified: Secondary | ICD-10-CM

## 2012-12-12 DIAGNOSIS — K5900 Constipation, unspecified: Secondary | ICD-10-CM

## 2012-12-12 DIAGNOSIS — M109 Gout, unspecified: Secondary | ICD-10-CM

## 2012-12-12 DIAGNOSIS — K219 Gastro-esophageal reflux disease without esophagitis: Secondary | ICD-10-CM

## 2012-12-12 DIAGNOSIS — I5030 Unspecified diastolic (congestive) heart failure: Secondary | ICD-10-CM

## 2012-12-26 ENCOUNTER — Telehealth: Payer: Self-pay | Admitting: Internal Medicine

## 2012-12-26 NOTE — Telephone Encounter (Signed)
Home healthcare nurse from [facility] stated that the patient has been "doubling up" on his iron pills, and taking two a day rather than one. Please assist.

## 2012-12-26 NOTE — Telephone Encounter (Signed)
Called [name] back told her have pt just use one iron pill a day. [name] verbalized understanding and stated she did already tell pt and that pt was accidentally taking for for about a week or so. Told her okay will let Dr. Amador Cunas know.

## 2012-12-26 NOTE — Telephone Encounter (Signed)
Elizabeth, nurse, with [facility] called to report that the patient came home from [facility] on 12/14/12 and now wants to start PT on 12/31/12.

## 2012-12-27 NOTE — Telephone Encounter (Signed)
ok 

## 2012-12-27 NOTE — Telephone Encounter (Signed)
Called [name] at [facility] left detailed message okay to start Physical Therapy per Dr. Amador Cunas.

## 2013-01-02 ENCOUNTER — Ambulatory Visit (INDEPENDENT_AMBULATORY_CARE_PROVIDER_SITE_OTHER): Payer: Medicare Other | Admitting: Internal Medicine

## 2013-01-02 ENCOUNTER — Ambulatory Visit: Payer: PRIVATE HEALTH INSURANCE | Admitting: Internal Medicine

## 2013-01-02 ENCOUNTER — Encounter: Payer: Self-pay | Admitting: Internal Medicine

## 2013-01-02 VITALS — BP 110/60 | HR 94 | Temp 98.3°F | Resp 20 | Wt 121.0 lb

## 2013-01-02 DIAGNOSIS — Z96649 Presence of unspecified artificial hip joint: Secondary | ICD-10-CM

## 2013-01-02 DIAGNOSIS — I509 Heart failure, unspecified: Secondary | ICD-10-CM

## 2013-01-02 DIAGNOSIS — N179 Acute kidney failure, unspecified: Secondary | ICD-10-CM

## 2013-01-02 DIAGNOSIS — I5031 Acute diastolic (congestive) heart failure: Secondary | ICD-10-CM

## 2013-01-02 DIAGNOSIS — N139 Obstructive and reflux uropathy, unspecified: Secondary | ICD-10-CM

## 2013-01-02 DIAGNOSIS — D62 Acute posthemorrhagic anemia: Secondary | ICD-10-CM

## 2013-01-02 DIAGNOSIS — I10 Essential (primary) hypertension: Secondary | ICD-10-CM

## 2013-01-02 MED ORDER — FUROSEMIDE 20 MG PO TABS: ORAL_TABLET | ORAL | Status: DC

## 2013-01-02 MED ORDER — POTASSIUM CHLORIDE ER 10 MEQ PO TBCR: EXTENDED_RELEASE_TABLET | ORAL | Status: DC

## 2013-01-02 NOTE — Patient Instructions (Addendum)
Limit your sodium (Salt) intake  Decrease furosemide to 3 times weekly Monday Wednesday Friday only  Decrease potassium to once daily

## 2013-01-03 ENCOUNTER — Encounter: Payer: Self-pay | Admitting: Internal Medicine

## 2013-01-03 NOTE — Progress Notes (Signed)
Subjective:    [name], male    DOB: Nov 23, 1924, 79 y.o.   MRN: [id number]  HPI  79 year old patient who is seen today in followup.  The patient was hospitalized in March for right hip revision surgery. Hospital course was complicated by acute renal failure with volume overload and diastolic heart failure. He had the blood loss anemia electrolyte abnormalities and also a obstructive uropathy. He continues to have a Foley catheter and is hopeful this will be discontinued soon. He is scheduled to see urology in followup soon. The patient was discharged to a rehabilitation facility and recently discharged to home. Hospital records reviewed Since his discharge he has done quite well. His main complaint today is some left knee pain. Denies any shortness of breath or any other symptoms of heart failure. Followup electrolytes and renal function indices have normalized Discharge medications reviewed  Past Medical History  Diagnosis Date  . Gout   . Insomnia   . BPH (benign prostatic hyperplasia)   . Kidney disorder   . Arthritis   . Hypertension     patient denies on 10/25/12 on no meds   . Complication of anesthesia     hard to wake up -2011 after hernia surgery     History   Social History  . Marital Status: Married    Spouse Name: N/A    Number of Children: N/A  . Years of Education: N/A   Occupational History  . Not on file.   Social History Main Topics  . Smoking status: Never Smoker   . Smokeless tobacco: Never Used  . Alcohol Use: No  . Drug Use: No  . Sexually Active: Not on file   Other Topics Concern  . Not on file   Social History Narrative  . No narrative on file    Past Surgical History  Procedure Laterality Date  . Cataract extraction    . Cholecystectomy    . Hernia repair  02/22/11    right  . Joint replacement      fractured hip  . Total hip arthroplasty Right 10/29/2012    Procedure: CONVERSION OF PREVIOUS HIP SURGERY TO RIGHT TOTAL  HIP AND REMOVAL OF IM NAIL ;  Surgeon: Shelda Pal, MD;  Location: [facility];  Service: Orthopedics;  Laterality: Right;  . Esophagogastroduodenoscopy N/A 11/16/2012    Procedure: ESOPHAGOGASTRODUODENOSCOPY (EGD);  Surgeon: Theda Belfast, MD;  Location: [facility];  Service: Endoscopy;  Laterality: N/A;    Family History  Problem Relation Age of Onset  . Heart failure Mother   . Heart failure Father   . Diabetes Neg Hx     Allergies  Allergen Reactions  . Nsaids Other (See Comments)    history of renal failure    Current Outpatient Prescriptions on File Prior to Visit  Medication Sig Dispense Refill  . acetaminophen (TYLENOL) 500 MG tablet Take 500 mg by mouth once.      Marland Kitchen allopurinol (ZYLOPRIM) 100 MG tablet Take 100 mg by mouth every morning.      . docusate sodium 100 MG CAPS Take 100 mg by mouth 2 (two) times daily.  10 capsule    . ferrous sulfate 325 (65 FE) MG tablet Take 325 mg by mouth every morning.      . polyethylene glycol (MIRALAX / GLYCOLAX) packet Take 17 g by mouth 2 (two) times daily.  14 each    . tamsulosin (FLOMAX) 0.4 MG CAPS Take 0.4 mg by  mouth daily after supper.       No current facility-administered medications on file prior to visit.    BP 110/60  Pulse 94  Temp(Src) 98.3 F (36.8 C) (Oral)  Resp 20  Wt 121 lb (54.885 kg)  BMI 19.54 kg/m2  SpO2 96%       Review of Systems  Constitutional: Positive for fatigue. Negative for fever, chills and appetite change.  HENT: Negative for hearing loss, ear pain, congestion, sore throat, trouble swallowing, neck stiffness, dental problem, voice change and tinnitus.   Eyes: Negative for pain, discharge and visual disturbance.  Respiratory: Negative for cough, chest tightness, wheezing and stridor.   Cardiovascular: Negative for chest pain, palpitations and leg swelling.  Gastrointestinal: Negative for nausea, vomiting, abdominal pain, diarrhea, constipation, blood in stool and abdominal distention.   Genitourinary: Negative for urgency, hematuria, flank pain, discharge, difficulty urinating and genital sores.  Musculoskeletal: Positive for arthralgias and gait problem. Negative for myalgias, back pain and joint swelling.  Skin: Negative for rash.  Neurological: Positive for weakness. Negative for dizziness, syncope, speech difficulty, numbness and headaches.  Hematological: Negative for adenopathy. Does not bruise/bleed easily.  Psychiatric/Behavioral: Negative for behavioral problems and dysphoric mood. The patient is not nervous/anxious.        Objective:   Physical Exam  Constitutional: He is oriented to person, place, and time. He appears well-developed.  Blood pressure 110/60  HENT:  Head: Normocephalic.  Right Ear: External ear normal.  Left Ear: External ear normal.  Eyes: Conjunctivae and EOM are normal.  Neck: Normal range of motion.  Cardiovascular: Normal rate and normal heart sounds.   Pulmonary/Chest: Breath sounds normal.  O2 saturation 96  Abdominal: Bowel sounds are normal.  Musculoskeletal: Normal range of motion. He exhibits no edema and no tenderness.  Neurological: He is alert and oriented to person, place, and time.  Psychiatric: He has a normal mood and affect. His behavior is normal.          Assessment & Plan:   Hypertension. This is well controlled and blood pressure is in a low-normal range History diastolic heart failure. Patient appears to be euvolemic. We'll decrease furosemide to a Monday Wednesday Friday regimen but will resume daily dosing if there is any peripheral edema. We'll decrease potassium to once daily Osteoarthritis. Status post right hip revision surgery BPH with obstructive uropathy. Followup urology hopeful to discontinue Foley catheter Electrolyte abnormalities resolved

## 2013-01-09 ENCOUNTER — Telehealth: Payer: Self-pay | Admitting: Internal Medicine

## 2013-01-09 NOTE — Telephone Encounter (Signed)
[facility] states that pt and caregiver no longer want to see skilled nursing. Pt has therefore been DC'd from skilled nursing. However, is still seeing PT & OT. FYI.

## 2013-01-11 ENCOUNTER — Telehealth: Payer: Self-pay | Admitting: Internal Medicine

## 2013-01-11 NOTE — Telephone Encounter (Signed)
Called and left detailed message for [name] PT and gave verbal order to continue PT 2 times a week for 4 weeks and 1 time a week for 3 weeks for patient.

## 2013-01-11 NOTE — Telephone Encounter (Signed)
Physical therapist called to request an additional order for 2 times a week for 4 weeks and 1 time a week for 3 weeks. Please assist.

## 2013-01-27 ENCOUNTER — Encounter (HOSPITAL_BASED_OUTPATIENT_CLINIC_OR_DEPARTMENT_OTHER): Payer: Self-pay | Admitting: *Deleted

## 2013-01-27 ENCOUNTER — Emergency Department (HOSPITAL_BASED_OUTPATIENT_CLINIC_OR_DEPARTMENT_OTHER)
Admission: EM | Admit: 2013-01-27 | Discharge: 2013-01-27 | Disposition: A | Discharge status: Home / Self Care | Payer: Medicare Other | Attending: Emergency Medicine | Admitting: Emergency Medicine

## 2013-01-27 DIAGNOSIS — G4700 Insomnia, unspecified: Secondary | ICD-10-CM | POA: Insufficient documentation

## 2013-01-27 DIAGNOSIS — Z87448 Personal history of other diseases of urinary system: Secondary | ICD-10-CM | POA: Insufficient documentation

## 2013-01-27 DIAGNOSIS — N390 Urinary tract infection, site not specified: Secondary | ICD-10-CM

## 2013-01-27 DIAGNOSIS — Y846 Urinary catheterization as the cause of abnormal reaction of the patient, or of later complication, without mention of misadventure at the time of the procedure: Secondary | ICD-10-CM | POA: Insufficient documentation

## 2013-01-27 DIAGNOSIS — T83091A Other mechanical complication of indwelling urethral catheter, initial encounter: Secondary | ICD-10-CM | POA: Insufficient documentation

## 2013-01-27 DIAGNOSIS — Z79899 Other long term (current) drug therapy: Secondary | ICD-10-CM | POA: Insufficient documentation

## 2013-01-27 DIAGNOSIS — I10 Essential (primary) hypertension: Secondary | ICD-10-CM | POA: Insufficient documentation

## 2013-01-27 LAB — URINALYSIS, ROUTINE W REFLEX MICROSCOPIC
Glucose, UA: NEGATIVE mg/dL
Hgb urine dipstick: NEGATIVE
Specific Gravity, Urine: 1.017 (ref 1.005–1.030)
Urobilinogen, UA: 0.2 mg/dL (ref 0.0–1.0)

## 2013-01-27 LAB — URINE MICROSCOPIC-ADD ON

## 2013-01-27 MED ORDER — CEFTRIAXONE SODIUM 1 G IJ SOLR
1.0000 g | Freq: Once | INTRAMUSCULAR | Status: AC
  Administered 2013-01-27: 1 g via INTRAMUSCULAR
  Filled 2013-01-27: qty 10

## 2013-01-27 MED ORDER — CEPHALEXIN 250 MG PO CAPS: 250.0000 mg | ORAL_CAPSULE | Freq: Four times a day (QID) | ORAL | Status: DC

## 2013-01-27 NOTE — ED Notes (Signed)
Foley cath changed by Waneta Martins. RN 16 fr 10 ml cath inserted draining clear yellow no obvious signs of trauma or bleeding noted pt denies pain or discomfort.

## 2013-01-27 NOTE — ED Notes (Signed)
Pt states his foley catheter came out and it needs to be replaced.

## 2013-01-27 NOTE — ED Provider Notes (Signed)
History  This chart was scribed for Jordan Crease, MD by Ardelia Mems, ED Scribe. This patient was seen in room MH01/MH01 and the patient's care was started at 6:57 PM.   CSN: [id number]  Arrival date & time 01/27/13  1815     Chief Complaint  Patient presents with  . Urinary Incontinence     The history is provided by the patient. No language interpreter was used.    HPI Comments: [name] is a 79 y.o. male with a h/o kidney disorder who presents to the Emergency Department complaining of urinary continence. Pt states that he has had a foley catheter in place for several weeks, which came out about 2 hours ago. Pt is not in acute distress and denies pain. Pt states that he felt like he could urinate for the last sevreal days. Pt also states that he has noticed white mucous around his penis the last couple days. Pt states that he is not on antibiotics. Pt denies fever, vomiting, diarrhea or any other symptoms.   Past Medical History  Diagnosis Date  . Gout   . Insomnia   . BPH (benign prostatic hyperplasia)   . Kidney disorder   . Arthritis   . Hypertension     patient denies on 10/25/12 on no meds   . Complication of anesthesia     hard to wake up -2011 after hernia surgery     Past Surgical History  Procedure Laterality Date  . Cataract extraction    . Cholecystectomy    . Hernia repair  02/22/11    right  . Joint replacement      fractured hip  . Total hip arthroplasty Right 10/29/2012    Procedure: CONVERSION OF PREVIOUS HIP SURGERY TO RIGHT TOTAL HIP AND REMOVAL OF IM NAIL ;  Surgeon: Shelda Pal, MD;  Location: [facility];  Service: Orthopedics;  Laterality: Right;  . Esophagogastroduodenoscopy N/A 11/16/2012    Procedure: ESOPHAGOGASTRODUODENOSCOPY (EGD);  Surgeon: Theda Belfast, MD;  Location: [facility];  Service: Endoscopy;  Laterality: N/A;    Family History  Problem Relation Age of Onset  . Heart failure Mother   . Heart failure Father   .  Diabetes Neg Hx     History  Substance Use Topics  . Smoking status: Never Smoker   . Smokeless tobacco: Never Used  . Alcohol Use: No      Review of Systems  Constitutional: Negative for fever and chills.  Gastrointestinal: Negative for nausea, vomiting and diarrhea.  Genitourinary:       Urinary incontinence.   A complete 10 system review of systems was obtained and all systems are negative except as noted in the HPI and PMH.   Allergies  Nsaids  Home Medications   Current Outpatient Rx  Name  Route  Sig  Dispense  Refill  . acetaminophen (TYLENOL) 500 MG tablet   Oral   Take 500 mg by mouth once.         Marland Kitchen allopurinol (ZYLOPRIM) 100 MG tablet   Oral   Take 100 mg by mouth every morning.         . docusate sodium 100 MG CAPS   Oral   Take 100 mg by mouth 2 (two) times daily.   10 capsule      . ferrous sulfate 325 (65 FE) MG tablet   Oral   Take 325 mg by mouth every morning.         Marland Kitchen  furosemide (LASIX) 20 MG tablet      1 tablet Monday Wednesday Friday only   90 tablet   0   . polyethylene glycol (MIRALAX / GLYCOLAX) packet   Oral   Take 17 g by mouth 2 (two) times daily.   14 each      . potassium chloride (K-DUR) 10 MEQ tablet      1 tablet daily   90 tablet   6   . tamsulosin (FLOMAX) 0.4 MG CAPS   Oral   Take 0.4 mg by mouth daily after supper.           Triage Vitals: BP 125/64  Pulse 96  Temp(Src) 98.2 F (36.8 C) (Oral)  Resp 20  Ht 5' 5.5" (1.664 m)  Wt 125 lb (56.7 kg)  BMI 20.48 kg/m2  SpO2 99%  Physical Exam  Constitutional: He is oriented to person, place, and time. He appears well-developed and well-nourished. No distress.  HENT:  Head: Normocephalic and atraumatic.  Right Ear: Hearing normal.  Left Ear: Hearing normal.  Nose: Nose normal.  Mouth/Throat: Oropharynx is clear and moist and mucous membranes are normal.  Eyes: Conjunctivae and EOM are normal. Pupils are equal, round, and reactive to light.   Neck: Normal range of motion. Neck supple.  Cardiovascular: Regular rhythm, S1 normal and S2 normal.  Exam reveals no gallop and no friction rub.   No murmur heard. Pulmonary/Chest: Effort normal and breath sounds normal. No respiratory distress. He exhibits no tenderness.  Abdominal: Soft. Normal appearance and bowel sounds are normal. There is no hepatosplenomegaly. There is no tenderness. There is no rebound, no guarding, no tenderness at McBurney's point and negative Murphy's sign. No hernia.  Musculoskeletal: Normal range of motion.  Neurological: He is alert and oriented to person, place, and time. He has normal strength. No cranial nerve deficit or sensory deficit. Coordination normal. GCS eye subscore is 4. GCS verbal subscore is 5. GCS motor subscore is 6.  Skin: Skin is warm, dry and intact. No rash noted. No cyanosis.  Psychiatric: He has a normal mood and affect. His speech is normal and behavior is normal. Thought content normal.    ED Course  Procedures (including critical care time)  DIAGNOSTIC STUDIES: Oxygen Saturation is 99% on RA, normal by my interpretation.    COORDINATION OF CARE: 7:01 PM- Pt advised of plan for treatment and pt agrees.     Labs Reviewed  URINALYSIS, ROUTINE W REFLEX MICROSCOPIC - Abnormal; Notable for the following:    APPearance CLOUDY (*)    Nitrite POSITIVE (*)    Leukocytes, UA LARGE (*)    All other components within normal limits  URINE MICROSCOPIC-ADD ON - Abnormal; Notable for the following:    Bacteria, UA MANY (*)    All other components within normal limits  URINE CULTURE   No results found.   Diagnosis: UTI with chronic indwelling Foley catheter    MDM  Patient presents stating that his Foley catheter has not been functioning. He feels like it has slipped out. The catheter was replaced and a catheterized urine specimen was sent for urinalysis. There is evidence of infection. Patient treated with Rocephin here in the ER  will be discharged with prescription for Keflex. Patient is to follow up with primary doctor in the office in 2 days. Return to ER for fever, nausea or vomiting.   I personally performed the services described in this documentation, which was scribed in my presence. The recorded  information has been reviewed and is accurate.    Jordan Crease, MD 01/27/13 2023

## 2013-01-30 LAB — URINE CULTURE

## 2013-02-01 DIAGNOSIS — Z466 Encounter for fitting and adjustment of urinary device: Secondary | ICD-10-CM

## 2013-02-01 DIAGNOSIS — IMO0001 Reserved for inherently not codable concepts without codable children: Secondary | ICD-10-CM

## 2013-02-01 DIAGNOSIS — N189 Chronic kidney disease, unspecified: Secondary | ICD-10-CM

## 2013-02-01 DIAGNOSIS — A070 Balantidiasis: Secondary | ICD-10-CM

## 2013-02-01 DIAGNOSIS — N139 Obstructive and reflux uropathy, unspecified: Secondary | ICD-10-CM

## 2013-02-01 NOTE — ED Notes (Signed)
Post ED Visit - Positive Culture Follow-up  Culture report reviewed by antimicrobial stewardship pharmacist: [x]  Wes Dulaney, Pharm.D., BCPS []  Celedonio Miyamoto, Pharm.D., BCPS []  Georgina Pillion, [address].D., BCPS []  [place], [place].D., BCPS, AAHIVP []  Estella Husk, Pharm.D., BCPS, AAHIVP  Positive urine culture Treated with Cephalexin organism sensitive to the same and no further patient follow-up is required at this time.  Larena Sox 02/01/2013, 11:53 AM

## 2013-02-05 ENCOUNTER — Telehealth: Payer: Self-pay | Admitting: Internal Medicine

## 2013-02-05 NOTE — Telephone Encounter (Signed)
ok 

## 2013-02-05 NOTE — Telephone Encounter (Signed)
Calling to request order for outpatient therapy. Pt would like to go to [facility].  Pt is doing so much better and wanting to get out for therapy. Pls fax order to:   [phone]

## 2013-02-06 ENCOUNTER — Encounter: Payer: Self-pay | Admitting: *Deleted

## 2013-02-06 DIAGNOSIS — R531 Weakness: Secondary | ICD-10-CM | POA: Insufficient documentation

## 2013-02-06 NOTE — Telephone Encounter (Signed)
Order faxed to [facility] and therapist Lanora Manis notified order okay and done.

## 2013-02-22 ENCOUNTER — Emergency Department (HOSPITAL_BASED_OUTPATIENT_CLINIC_OR_DEPARTMENT_OTHER): Payer: Medicare Other

## 2013-02-22 ENCOUNTER — Encounter (HOSPITAL_BASED_OUTPATIENT_CLINIC_OR_DEPARTMENT_OTHER): Payer: Self-pay | Admitting: *Deleted

## 2013-02-22 ENCOUNTER — Emergency Department (HOSPITAL_BASED_OUTPATIENT_CLINIC_OR_DEPARTMENT_OTHER)
Admission: EM | Admit: 2013-02-22 | Discharge: 2013-02-22 | Disposition: A | Discharge status: Home / Self Care | Payer: Medicare Other | Attending: Emergency Medicine | Admitting: Emergency Medicine

## 2013-02-22 DIAGNOSIS — M545 Low back pain, unspecified: Secondary | ICD-10-CM | POA: Insufficient documentation

## 2013-02-22 DIAGNOSIS — N390 Urinary tract infection, site not specified: Secondary | ICD-10-CM | POA: Insufficient documentation

## 2013-02-22 DIAGNOSIS — I10 Essential (primary) hypertension: Secondary | ICD-10-CM | POA: Insufficient documentation

## 2013-02-22 DIAGNOSIS — Z79899 Other long term (current) drug therapy: Secondary | ICD-10-CM | POA: Insufficient documentation

## 2013-02-22 DIAGNOSIS — M109 Gout, unspecified: Secondary | ICD-10-CM | POA: Insufficient documentation

## 2013-02-22 DIAGNOSIS — Z87448 Personal history of other diseases of urinary system: Secondary | ICD-10-CM | POA: Insufficient documentation

## 2013-02-22 DIAGNOSIS — N400 Enlarged prostate without lower urinary tract symptoms: Secondary | ICD-10-CM | POA: Insufficient documentation

## 2013-02-22 DIAGNOSIS — Z8739 Personal history of other diseases of the musculoskeletal system and connective tissue: Secondary | ICD-10-CM | POA: Insufficient documentation

## 2013-02-22 LAB — URINALYSIS, ROUTINE W REFLEX MICROSCOPIC
Glucose, UA: NEGATIVE mg/dL
Leukocytes, UA: NEGATIVE
Nitrite: POSITIVE — AB
Protein, ur: NEGATIVE mg/dL
Urobilinogen, UA: 0.2 mg/dL (ref 0.0–1.0)

## 2013-02-22 LAB — URINE MICROSCOPIC-ADD ON

## 2013-02-22 NOTE — ED Notes (Signed)
Catheter changed on June 26th

## 2013-02-22 NOTE — ED Provider Notes (Signed)
History    CSN: [id number] Arrival date & time 02/22/13  5621  First MD Initiated Contact with Patient 02/22/13 1916     Chief Complaint  Patient presents with  . Back Pain  . Urinary Tract Infection   (Consider location/radiation/quality/duration/timing/severity/associated sxs/prior Treatment) HPI Comments: Patient with history of THR on the right in March.  He developed urinary retention post-operatively and has required a foley catheter since.  He reports a several day history of lumbar pain and pain in the right hip.  He called the urologist yesterday and was started on levaquin.  He has had two doses but is not feeling much better.    Patient is a 79 y.o. male presenting with back pain. The history is provided by the patient.  Back Pain Location:  Lumbar spine Quality:  Stabbing Radiates to:  Does not radiate Pain severity:  Moderate Pain is:  Same all the time Onset quality:  Gradual Duration:  3 days Timing:  Constant Progression:  Worsening Chronicity:  New Context: not falling   Relieved by: lying flat. Exacerbated by: sitting upright.  Past Medical History  Diagnosis Date  . Gout   . Insomnia   . BPH (benign prostatic hyperplasia)   . Kidney disorder   . Arthritis   . Hypertension     patient denies on 10/25/12 on no meds   . Complication of anesthesia     hard to wake up -2011 after hernia surgery    Past Surgical History  Procedure Laterality Date  . Cataract extraction    . Cholecystectomy    . Hernia repair  02/22/11    right  . Joint replacement      fractured hip  . Total hip arthroplasty Right 10/29/2012    Procedure: CONVERSION OF PREVIOUS HIP SURGERY TO RIGHT TOTAL HIP AND REMOVAL OF IM NAIL ;  Surgeon: Shelda Pal, MD;  Location: [facility];  Service: Orthopedics;  Laterality: Right;  . Esophagogastroduodenoscopy N/A 11/16/2012    Procedure: ESOPHAGOGASTRODUODENOSCOPY (EGD);  Surgeon: Theda Belfast, MD;  Location: [facility];  Service: Endoscopy;   Laterality: N/A;   Family History  Problem Relation Age of Onset  . Heart failure Mother   . Heart failure Father   . Diabetes Neg Hx    History  Substance Use Topics  . Smoking status: Never Smoker   . Smokeless tobacco: Never Used  . Alcohol Use: No    Review of Systems  Musculoskeletal: Positive for back pain.  All other systems reviewed and are negative.    Allergies  Nsaids  Home Medications   Current Outpatient Rx  Name  Route  Sig  Dispense  Refill  . acetaminophen (TYLENOL) 500 MG tablet   Oral   Take 500 mg by mouth once.         Marland Kitchen allopurinol (ZYLOPRIM) 100 MG tablet   Oral   Take 100 mg by mouth every morning.         . cephALEXin (KEFLEX) 250 MG capsule   Oral   Take 1 capsule (250 mg total) by mouth 4 (four) times daily.   40 capsule   0   . docusate sodium 100 MG CAPS   Oral   Take 100 mg by mouth 2 (two) times daily.   10 capsule      . ferrous sulfate 325 (65 FE) MG tablet   Oral   Take 325 mg by mouth every morning.         Marland Kitchen  furosemide (LASIX) 20 MG tablet      1 tablet Monday Wednesday Friday only   90 tablet   0   . polyethylene glycol (MIRALAX / GLYCOLAX) packet   Oral   Take 17 g by mouth 2 (two) times daily.   14 each      . potassium chloride (K-DUR) 10 MEQ tablet      1 tablet daily   90 tablet   6   . tamsulosin (FLOMAX) 0.4 MG CAPS   Oral   Take 0.4 mg by mouth daily after supper.          BP 137/65  Pulse 103  Temp(Src) 98.2 F (36.8 C) (Oral)  Resp 18  Ht 5\' 6"  (1.676 m)  Wt 130 lb (58.968 kg)  BMI 20.99 kg/m2  SpO2 99% Physical Exam  Nursing note reviewed. Constitutional: He is oriented to person, place, and time. He appears well-developed and well-nourished. No distress.  HENT:  Head: Normocephalic and atraumatic.  Mouth/Throat: Oropharynx is clear and moist.  Neck: Normal range of motion. Neck supple.  Cardiovascular: Normal rate, regular rhythm and normal heart sounds.   No murmur  heard. Pulmonary/Chest: Effort normal and breath sounds normal. No respiratory distress. He has no wheezes.  Abdominal: Soft. Bowel sounds are normal. He exhibits no distension. There is no tenderness.  Musculoskeletal: Normal range of motion.  There is ttp in the right lumbar region.  No bony ttp or stepoffs.    Neurological: He is alert and oriented to person, place, and time.  He is able to ambulate without difficulty.  DTR's are 1+ and equal in the ble.    Skin: Skin is warm and dry. He is not diaphoretic. No erythema.    ED Course  Procedures (including critical care time) Labs Reviewed  URINALYSIS, ROUTINE W REFLEX MICROSCOPIC - Abnormal; Notable for the following:    Nitrite POSITIVE (*)    All other components within normal limits  URINE MICROSCOPIC-ADD ON - Abnormal; Notable for the following:    Bacteria, UA FEW (*)    All other components within normal limits   No results found. No diagnosis found.  MDM  The patient presents with discomfort in the lower back that he is concerned could either be a uti or an issue with his hip.  The hip xrays look okay, but the lumbar spine xrays show significant degenerative changes and age indeterminate L2 compression fracture that is not in the area of his discomfort.  The urine show positive nitrite but otherwise appears clear.  He is on his second dose of levaquin and I have advised him to continue this.  Return or follow up prn.  Geoffery Lyons, MD 02/22/13 2034

## 2013-02-22 NOTE — ED Notes (Signed)
Pt. Seen by Dr. Burgess Estelle for UTI and today Pt. Reports feeling worse so he came to ED.

## 2013-02-22 NOTE — ED Notes (Signed)
Lower back pain. Right hip pain. No known injury. Was treated for a UTI 2 days ago.

## 2013-03-17 ENCOUNTER — Encounter (HOSPITAL_BASED_OUTPATIENT_CLINIC_OR_DEPARTMENT_OTHER): Payer: Self-pay

## 2013-03-17 ENCOUNTER — Emergency Department (HOSPITAL_BASED_OUTPATIENT_CLINIC_OR_DEPARTMENT_OTHER)
Admission: EM | Admit: 2013-03-17 | Discharge: 2013-03-17 | Disposition: A | Discharge status: Home / Self Care | Payer: Medicare Other | Attending: Emergency Medicine | Admitting: Emergency Medicine

## 2013-03-17 ENCOUNTER — Emergency Department (HOSPITAL_BASED_OUTPATIENT_CLINIC_OR_DEPARTMENT_OTHER): Payer: Medicare Other

## 2013-03-17 DIAGNOSIS — R5383 Other fatigue: Secondary | ICD-10-CM | POA: Insufficient documentation

## 2013-03-17 DIAGNOSIS — M109 Gout, unspecified: Secondary | ICD-10-CM | POA: Insufficient documentation

## 2013-03-17 DIAGNOSIS — Z87448 Personal history of other diseases of urinary system: Secondary | ICD-10-CM | POA: Insufficient documentation

## 2013-03-17 DIAGNOSIS — I10 Essential (primary) hypertension: Secondary | ICD-10-CM | POA: Insufficient documentation

## 2013-03-17 DIAGNOSIS — Z79899 Other long term (current) drug therapy: Secondary | ICD-10-CM | POA: Insufficient documentation

## 2013-03-17 DIAGNOSIS — G4700 Insomnia, unspecified: Secondary | ICD-10-CM | POA: Insufficient documentation

## 2013-03-17 DIAGNOSIS — N400 Enlarged prostate without lower urinary tract symptoms: Secondary | ICD-10-CM | POA: Insufficient documentation

## 2013-03-17 DIAGNOSIS — M129 Arthropathy, unspecified: Secondary | ICD-10-CM | POA: Insufficient documentation

## 2013-03-17 DIAGNOSIS — R531 Weakness: Secondary | ICD-10-CM

## 2013-03-17 DIAGNOSIS — R5381 Other malaise: Secondary | ICD-10-CM | POA: Insufficient documentation

## 2013-03-17 LAB — URINALYSIS, ROUTINE W REFLEX MICROSCOPIC
Bilirubin Urine: NEGATIVE
Hgb urine dipstick: NEGATIVE
Specific Gravity, Urine: 1.015 (ref 1.005–1.030)
pH: 6 (ref 5.0–8.0)

## 2013-03-17 LAB — COMPREHENSIVE METABOLIC PANEL
ALT: 11 U/L (ref 0–53)
AST: 18 U/L (ref 0–37)
CO2: 21 mEq/L (ref 19–32)
Calcium: 9.6 mg/dL (ref 8.4–10.5)
Sodium: 139 mEq/L (ref 135–145)
Total Protein: 6.3 g/dL (ref 6.0–8.3)

## 2013-03-17 LAB — CBC WITH DIFFERENTIAL/PLATELET
Basophils Absolute: 0 10*3/uL (ref 0.0–0.1)
Eosinophils Absolute: 0 10*3/uL (ref 0.0–0.7)
Eosinophils Relative: 1 % (ref 0–5)
Lymphocytes Relative: 11 % — ABNORMAL LOW (ref 12–46)
MCV: 91.2 fL (ref 78.0–100.0)
Platelets: 141 10*3/uL — ABNORMAL LOW (ref 150–400)
RDW: 13.5 % (ref 11.5–15.5)
WBC: 6.6 10*3/uL (ref 4.0–10.5)

## 2013-03-17 MED ORDER — SODIUM CHLORIDE 0.9 % IV BOLUS (SEPSIS)
500.0000 mL | Freq: Once | INTRAVENOUS | Status: AC
  Administered 2013-03-17: 500 mL via INTRAVENOUS

## 2013-03-17 MED ORDER — SODIUM CHLORIDE 0.9 % IV SOLN
Freq: Once | INTRAVENOUS | Status: AC
  Administered 2013-03-17: 12:00:00 via INTRAVENOUS

## 2013-03-17 NOTE — ED Notes (Signed)
MD at bedside. 

## 2013-03-17 NOTE — ED Notes (Signed)
Pt reports he hasn't felt well in 3 days and reports this am felt worse.

## 2013-03-17 NOTE — ED Notes (Signed)
Urinal provided.

## 2013-03-17 NOTE — ED Notes (Signed)
Patient transported to X-ray 

## 2013-03-17 NOTE — ED Provider Notes (Addendum)
CSN: [id number]     Arrival date & time 03/17/13  1104 History     First MD Initiated Contact with Patient 03/17/13 1116     Chief Complaint  Patient presents with  . Back Pain   (Consider location/radiation/quality/duration/timing/severity/associated sxs/prior Treatment) HPI  Patient presents with concern of generalized weakness and low back pain. Symptoms have been present low-grade for some time, but over the past 3 days has become more present and incapacitating. Patient is generally well, ambulatory, awake and alert. He notes that over the past 3 days he has had weakness with ambulation and in general.  There is no new asymmetry, but the patient states that he feels fatigued, weak in all muscle groups. No new fevers, chills, vomiting. The patient does endorse nausea. Patient has no history of right total hip replacement earlier this year with chronically catheter placement.  Foley catheter was removed 2 weeks ago. Since onset of this illness, there has been no clear alleviating or exacerbating factors.   Past Medical History  Diagnosis Date  . Gout   . Insomnia   . BPH (benign prostatic hyperplasia)   . Kidney disorder   . Arthritis   . Hypertension     patient denies on 10/25/12 on no meds   . Complication of anesthesia     hard to wake up -2011 after hernia surgery    Past Surgical History  Procedure Laterality Date  . Cataract extraction    . Cholecystectomy    . Hernia repair  02/22/11    right  . Joint replacement      fractured hip  . Total hip arthroplasty Right 10/29/2012    Procedure: CONVERSION OF PREVIOUS HIP SURGERY TO RIGHT TOTAL HIP AND REMOVAL OF IM NAIL ;  Surgeon: Shelda Pal, MD;  Location: [facility];  Service: Orthopedics;  Laterality: Right;  . Esophagogastroduodenoscopy N/A 11/16/2012    Procedure: ESOPHAGOGASTRODUODENOSCOPY (EGD);  Surgeon: Theda Belfast, MD;  Location: [facility];  Service: Endoscopy;  Laterality: N/A;   Family History   Problem Relation Age of Onset  . Heart failure Mother   . Heart failure Father   . Diabetes Neg Hx    History  Substance Use Topics  . Smoking status: Never Smoker   . Smokeless tobacco: Never Used  . Alcohol Use: No    Review of Systems  Constitutional:       Per HPI, otherwise negative  HENT:       Per HPI, otherwise negative  Respiratory:       Per HPI, otherwise negative  Cardiovascular:       Per HPI, otherwise negative  Gastrointestinal: Negative for vomiting.  Endocrine:       Negative aside from HPI  Genitourinary:       Neg aside from HPI   Musculoskeletal:       Per HPI, otherwise negative  Skin: Negative.   Neurological: Negative for syncope.    Allergies  Nsaids  Home Medications   Current Outpatient Rx  Name  Route  Sig  Dispense  Refill  . acetaminophen (TYLENOL) 500 MG tablet   Oral   Take 500 mg by mouth once.         Marland Kitchen allopurinol (ZYLOPRIM) 100 MG tablet   Oral   Take 100 mg by mouth every morning.         . ferrous sulfate 325 (65 FE) MG tablet   Oral   Take 325 mg by mouth every  morning.         . furosemide (LASIX) 20 MG tablet      1 tablet Monday Wednesday Friday only   90 tablet   0   . potassium chloride (K-DUR) 10 MEQ tablet      1 tablet daily   90 tablet   6   . tamsulosin (FLOMAX) 0.4 MG CAPS   Oral   Take 0.4 mg by mouth daily after supper.          BP 135/62  Pulse 102  Temp(Src) 98.3 F (36.8 C) (Oral)  Resp 20  SpO2 99% Physical Exam  Nursing note and vitals reviewed. Constitutional: He is oriented to person, place, and time. He appears well-developed. No distress.  HENT:  Head: Normocephalic and atraumatic.  Eyes: Conjunctivae and EOM are normal.  Cardiovascular: Normal rate and regular rhythm.   Pulmonary/Chest: Effort normal. No stridor. No respiratory distress.  Abdominal: He exhibits no distension.  Musculoskeletal: He exhibits no edema.  Neurological: He is alert and oriented to  person, place, and time.  Skin: Skin is warm and dry.  Ecchymotic patch on the right forearm  Psychiatric: He has a normal mood and affect.    ED Course   Procedures (including critical care time)  Labs Reviewed  CBC WITH DIFFERENTIAL  COMPREHENSIVE METABOLIC PANEL  URINALYSIS, ROUTINE W REFLEX MICROSCOPIC  LACTIC ACID, PLASMA   No results found. No diagnosis found. Pulse ox 99% room air normal I reviewed the patient's chart, including recent emergency department visit with diagnoses of urinary tract infection.    Date: 03/17/2013  Rate: 80  Rhythm: normal sinus rhythm  QRS Axis: normal  Intervals: normal  ST/T Wave abnormalities: normal  Conduction Disutrbances: none  Narrative Interpretation: unremarkable   Patient's chart demonstrates recent ECHO w preserved EF, but w diastolic dysfunction.  12:39 PM On repeat exam the patient appears comfortable.  I discussed all results with him and the family members.  There is evidence of mild dehydration.  Otherwise labs are largely reassuring.   MDM  Patient presents with generalized fatigue.  The denial of fevers, chills, cough, dysuria his reassuring.  Similarly, the patient has no leukocytosis, unremarkable urine exam. Patient does have evidence of hemoconcentration, dehydration.  Patient received IV fluid bolus here. With no evidence of significant ongoing infection, with preserved neurovascular status, and with the patient's capacity to follow up with his primary care physician tomorrow, he was appropriate for discharge with further evaluation and management as an outpatient.  Gerhard Munch, MD 03/17/13 1240  Gerhard Munch, MD 03/17/13 1253

## 2013-03-17 NOTE — ED Notes (Signed)
Patient here with ongoing lower backpain for the past several weeks. Also complains of general weakness, reports that he did get a little weak and fell back against the sofa but did not hit the floor. Alert and oriented.

## 2013-03-28 ENCOUNTER — Encounter: Payer: Self-pay | Admitting: Adult Health

## 2013-03-28 DIAGNOSIS — K5900 Constipation, unspecified: Secondary | ICD-10-CM | POA: Insufficient documentation

## 2013-03-28 DIAGNOSIS — K219 Gastro-esophageal reflux disease without esophagitis: Secondary | ICD-10-CM | POA: Insufficient documentation

## 2013-03-28 DIAGNOSIS — I5030 Unspecified diastolic (congestive) heart failure: Secondary | ICD-10-CM | POA: Insufficient documentation

## 2013-03-28 NOTE — Progress Notes (Signed)
[name], male   DOB: October 10, 1924, 79 y.o.   MRN: [id number]        PROGRESS NOTE  DATE: 12/12/2012   FACILITY: [facility]  LEVEL OF CARE: SNF (31)    CHIEF COMPLAINT:  Discharge Visit  HISTORY OF PRESENT ILLNESS:  This is an 79 year old male who is for discharge home with home health PT, OT and nursing. He will be discharged with Foley catheter draining to urine bag until seen by urologist. He has been admitted to [facility] on 11/23/12 from [facility]  with discharge diagnoses of atypical chest pain, diastolic CHF and anemia. Patient was admitted to this facility for short-term rehabilitation. Patient has completed SNF rehabilitation and therapy has cleared the patient for discharge.  Reassessment of ongoing problem(s):  CHF:The patient does not relate significant weight changes, denies sob, DOE, orthopnea, PNDs, pedal edema, palpitations or chest pain.  CHF remains stable.  No complications form the medications being used.  ANEMIA: The anemia has been stable. The patient denies fatigue, melena or hematochezia. No complications from the medications currently being used.   PAST MEDICAL HISTORY : Reviewed.  No changes.  CURRENT MEDICATIONS: Reviewed per [facility]  REVIEW OF SYSTEMS:  GENERAL: no change in appetite, no fatigue, no weight changes, no fever, chills or weakness RESPIRATORY: no cough, SOB, DOE, wheezing, hemoptysis CARDIAC: no chest pain,or palpitations, + edema GI: no abdominal pain, diarrhea, constipation, heart burn, nausea or vomiting  PHYSICAL EXAMINATION  VS:  T 98.4       P 91       RR 20      BP 114/63       POX 99 %       WT 126.2 (Lb)  GENERAL: no acute distress, normal body habitus EYES: conjunctivae normal, sclerae normal, normal eye lids NECK: supple, trachea midline, no neck masses, no thyroid tenderness, no thyromegaly LYMPHATICS: no LAN in the neck, no supraclavicular LAN RESPIRATORY: breathing is even &  unlabored, BS CTAB CARDIAC: RRR, no murmur,no extra heart sounds,  Edema BLE, 2+ GI: abdomen soft, normal BS, no masses, no tenderness, no hepatomegaly, no splenomegaly PSYCHIATRIC: the patient is alert & oriented to person, affect & behavior appropriate  LABS/RADIOLOGY: 12/05/12 uric acid 5.5 11/26/12 BMP normal 11/23/12 sodium 138 potassium 4.0 glucose 90 BUN 13 creatinine 1.29 call should 8.0   11/21/12 troponin <0.30 11/20/12 hemoglobin 9.5 hematocrit 27.9   ASSESSMENT/PLAN:  Diastolic CHF - well compensated  Obstructive uropathy - continue Foley copy to her until seen by a urologist; family to bring patient to scheduled appointment with urologist  GERD - stable  Anemia - stable  Gout - well controlled  I have filled out patient's discharge paperwork and written prescriptions.  Patient will receive home health PT, OT and Nursing.   Total discharge time: Less than 30 minutes Discharge time involved coordination of the discharge process with Child psychotherapist, nursing staff and therapy department. Medical justification for home health services verified.   CPT CODE: 78295

## 2013-04-11 ENCOUNTER — Ambulatory Visit: Payer: PRIVATE HEALTH INSURANCE | Admitting: Internal Medicine

## 2013-05-09 ENCOUNTER — Other Ambulatory Visit: Payer: Self-pay | Admitting: Internal Medicine

## 2013-06-27 ENCOUNTER — Other Ambulatory Visit: Payer: Self-pay

## 2013-09-26 ENCOUNTER — Encounter: Payer: Self-pay | Admitting: Internal Medicine

## 2013-09-26 ENCOUNTER — Ambulatory Visit (INDEPENDENT_AMBULATORY_CARE_PROVIDER_SITE_OTHER): Payer: Medicare Other | Admitting: Internal Medicine

## 2013-09-26 VITALS — BP 136/70 | HR 116 | Temp 98.3°F | Resp 20 | Ht 66.0 in | Wt 124.0 lb

## 2013-09-26 DIAGNOSIS — M1710 Unilateral primary osteoarthritis, unspecified knee: Secondary | ICD-10-CM

## 2013-09-26 DIAGNOSIS — I10 Essential (primary) hypertension: Secondary | ICD-10-CM

## 2013-09-26 DIAGNOSIS — M109 Gout, unspecified: Secondary | ICD-10-CM

## 2013-09-26 DIAGNOSIS — R634 Abnormal weight loss: Secondary | ICD-10-CM

## 2013-09-26 DIAGNOSIS — M1712 Unilateral primary osteoarthritis, left knee: Secondary | ICD-10-CM | POA: Insufficient documentation

## 2013-09-26 DIAGNOSIS — R609 Edema, unspecified: Secondary | ICD-10-CM

## 2013-09-26 DIAGNOSIS — IMO0002 Reserved for concepts with insufficient information to code with codable children: Secondary | ICD-10-CM

## 2013-09-26 DIAGNOSIS — R600 Localized edema: Secondary | ICD-10-CM

## 2013-09-26 LAB — COMPREHENSIVE METABOLIC PANEL
ALBUMIN: 3.6 g/dL (ref 3.5–5.2)
ALK PHOS: 129 U/L — AB (ref 39–117)
ALT: 36 U/L (ref 0–53)
AST: 38 U/L — ABNORMAL HIGH (ref 0–37)
BUN: 28 mg/dL — ABNORMAL HIGH (ref 6–23)
CO2: 21 meq/L (ref 19–32)
Calcium: 9.5 mg/dL (ref 8.4–10.5)
Chloride: 111 mEq/L (ref 96–112)
Creatinine, Ser: 1.4 mg/dL (ref 0.4–1.5)
GFR: 50.16 mL/min — AB (ref >60.00)
GLUCOSE: 90 mg/dL (ref 70–99)
POTASSIUM: 4.3 meq/L (ref 3.5–5.1)
SODIUM: 140 meq/L (ref 135–145)
TOTAL PROTEIN: 7.2 g/dL (ref 6.0–8.3)
Total Bilirubin: 1.5 mg/dL — ABNORMAL HIGH (ref 0.3–1.2)

## 2013-09-26 LAB — CBC WITH DIFFERENTIAL/PLATELET
BASOS ABS: 0 10*3/uL (ref 0.0–0.1)
Basophils Relative: 0.1 % (ref 0.0–3.0)
EOS PCT: 0.1 % (ref 0.0–5.0)
Eosinophils Absolute: 0 10*3/uL (ref 0.0–0.7)
HCT: 42.1 % (ref 39.0–52.0)
Hemoglobin: 13.9 g/dL (ref 13.0–17.0)
Lymphocytes Relative: 5.9 % — ABNORMAL LOW (ref 12.0–46.0)
Lymphs Abs: 0.9 10*3/uL (ref 0.7–4.0)
MCHC: 33 g/dL (ref 30.0–36.0)
MCV: 94.5 fl (ref 78.0–100.0)
MONO ABS: 0.7 10*3/uL (ref 0.1–1.0)
Monocytes Relative: 4.3 % (ref 3.0–12.0)
NEUTROS PCT: 89.6 % — AB (ref 43.0–77.0)
Neutro Abs: 13.6 10*3/uL — ABNORMAL HIGH (ref 1.4–7.7)
PLATELETS: 182 10*3/uL (ref 150.0–400.0)
RBC: 4.46 Mil/uL (ref 4.22–5.81)
RDW: 13.8 % (ref 11.5–14.6)
WBC: 15.2 10*3/uL — AB (ref 4.5–10.5)

## 2013-09-26 LAB — SEDIMENTATION RATE: SED RATE: 52 mm/h — AB (ref 0–22)

## 2013-09-26 MED ORDER — CEFUROXIME AXETIL 250 MG PO TABS: 250.0000 mg | ORAL_TABLET | Freq: Two times a day (BID) | ORAL | Status: DC

## 2013-09-26 NOTE — Progress Notes (Signed)
Subjective:    [name], male    DOB: Jun 29, 1925, 80 y.o.   MRN: [id number]  HPI  80 year old patient who has a history of hypertension. This is presently controlled without medications.  He presents today with a chief complaint of bruising involving his upper thighs bilaterally. He complains of weakness and weight loss. He has recently had a left knee injection with cortisone due to end-stage osteoarthritis  Wt Readings from Last 3 Encounters:  09/26/13 124 lb (56.246 kg)  02/22/13 130 lb (58.968 kg)  01/27/13 125 lb (56.7 kg)   Past Medical History  Diagnosis Date  . Gout   . Insomnia   . BPH (benign prostatic hyperplasia)   . Kidney disorder   . Arthritis   . Hypertension     patient denies on 10/25/12 on no meds   . Complication of anesthesia     hard to wake up -2011 after hernia surgery     History   Social History  . Marital Status: Married    Spouse Name: N/A    Number of Children: N/A  . Years of Education: N/A   Occupational History  . Not on file.   Social History Main Topics  . Smoking status: Never Smoker   . Smokeless tobacco: Never Used  . Alcohol Use: No  . Drug Use: No  . Sexual Activity: Not on file   Other Topics Concern  . Not on file   Social History Narrative  . No narrative on file    Past Surgical History  Procedure Laterality Date  . Cataract extraction    . Cholecystectomy    . Hernia repair  02/22/11    right  . Joint replacement      fractured hip  . Total hip arthroplasty Right 10/29/2012    Procedure: CONVERSION OF PREVIOUS HIP SURGERY TO RIGHT TOTAL HIP AND REMOVAL OF IM NAIL ;  Surgeon: Shelda Pal, MD;  Location: [facility];  Service: Orthopedics;  Laterality: Right;  . Esophagogastroduodenoscopy N/A 11/16/2012    Procedure: ESOPHAGOGASTRODUODENOSCOPY (EGD);  Surgeon: Theda Belfast, MD;  Location: [facility];  Service: Endoscopy;  Laterality: N/A;    Family History  Problem Relation Age of Onset  .  Heart failure Mother   . Heart failure Father   . Diabetes Neg Hx     Allergies  Allergen Reactions  . Nsaids Other (See Comments)    history of renal failure    Current Outpatient Prescriptions on File Prior to Visit  Medication Sig Dispense Refill  . acetaminophen (TYLENOL) 500 MG tablet Take 500 mg by mouth once.      Marland Kitchen allopurinol (ZYLOPRIM) 100 MG tablet TAKE 1 TABLET (100 MG TOTAL) BY MOUTH DAILY.  90 tablet  3  . ferrous sulfate 325 (65 FE) MG tablet Take 325 mg by mouth every morning.      . tamsulosin (FLOMAX) 0.4 MG CAPS Take 0.4 mg by mouth 2 (two) times daily.        No current facility-administered medications on file prior to visit.    BP 136/70  Pulse 116  Temp(Src) 98.3 F (36.8 C) (Oral)  Resp 20  Ht 5\' 6"  (1.676 m)  Wt 124 lb (56.246 kg)  BMI 20.02 kg/m2  SpO2 97%     . Review of Systems  Constitutional: Positive for activity change, appetite change and fatigue. Negative for fever and chills.  HENT: Negative for congestion, dental problem, ear pain, hearing  loss, sore throat, tinnitus, trouble swallowing and voice change.   Eyes: Negative for pain, discharge and visual disturbance.  Respiratory: Negative for cough, chest tightness, wheezing and stridor.   Cardiovascular: Negative for chest pain, palpitations and leg swelling.  Gastrointestinal: Negative for nausea, vomiting, abdominal pain, diarrhea, constipation, blood in stool and abdominal distention.  Genitourinary: Negative for urgency, hematuria, flank pain, discharge, difficulty urinating and genital sores.  Musculoskeletal: Negative for arthralgias, back pain, gait problem, joint swelling, myalgias and neck stiffness.  Skin: Negative for rash.       Easy bruisability  Neurological: Negative for dizziness, syncope, speech difficulty, weakness, numbness and headaches.  Hematological: Negative for adenopathy. Does not bruise/bleed easily.  Psychiatric/Behavioral: Negative for behavioral problems  and dysphoric mood. The patient is not nervous/anxious.        Objective:   Physical Exam  Constitutional: He is oriented to person, place, and time. He appears well-developed.  HENT:  Head: Normocephalic.  Right Ear: External ear normal.  Left Ear: External ear normal.  Eyes: Conjunctivae and EOM are normal.  Neck: Normal range of motion.  Cardiovascular: Normal rate and normal heart sounds.   Pulmonary/Chest: Breath sounds normal.  Abdominal: Bowel sounds are normal.  Musculoskeletal: Normal range of motion. He exhibits no edema and no tenderness.  The right anterior lower leg was erythematous and warm to touch  Neurological: He is alert and oriented to person, place, and time.  Skin:  Scattered ecchymoses over the thigh areas bilaterally Ecchymoses also noted over the hands and lower arms but less prominent  Psychiatric: He has a normal mood and affect. His behavior is normal.          Assessment & Plan:   Easy bruisability. We'll check a CBC  Hypertension. Controlled Of medication History of weight loss  Early cellulitis right anterior lower leg  Schedule CPX

## 2013-09-26 NOTE — Patient Instructions (Addendum)
Limit your sodium (Salt) intake    It is important that you exercise regularly, at least 20 minutes 3 to 4 times per week.  If you develop chest pain or shortness of breath seek  medical attention.  Return in 3 months for follow-up Cellulitis Cellulitis is an infection of the skin and the tissue beneath it. The infected area is usually red and tender. Cellulitis occurs most often in the arms and lower legs.  CAUSES  Cellulitis is caused by bacteria that enter the skin through cracks or cuts in the skin. The most common types of bacteria that cause cellulitis are Staphylococcus and Streptococcus. SYMPTOMS   Redness and warmth.  Swelling.  Tenderness or pain.  Fever. DIAGNOSIS  Your caregiver can usually determine what is wrong based on a physical exam. Blood tests may also be done. TREATMENT  Treatment usually involves taking an antibiotic medicine. HOME CARE INSTRUCTIONS   Take your antibiotics as directed. Finish them even if you start to feel better.  Keep the infected arm or leg elevated to reduce swelling.  Apply a warm cloth to the affected area up to 4 times per day to relieve pain.  Only take over-the-counter or prescription medicines for pain, discomfort, or fever as directed by your caregiver.  Keep all follow-up appointments as directed by your caregiver. SEEK MEDICAL CARE IF:   You notice red streaks coming from the infected area.  Your red area gets larger or turns dark in color.  Your bone or joint underneath the infected area becomes painful after the skin has healed.  Your infection returns in the same area or another area.  You notice a swollen bump in the infected area.  You develop new symptoms. SEEK IMMEDIATE MEDICAL CARE IF:   You have a fever.  You feel very sleepy.  You develop vomiting or diarrhea.  You have a general ill feeling (malaise) with muscle aches and pains. MAKE SURE YOU:   Understand these instructions.  Will watch your  condition.  Will get help right away if you are not doing well or get worse. Document Released: 05/18/2005 Document Revised: 02/07/2012 Document Reviewed: 10/24/2011 [facility] Patient Information 2014 [place], [place].

## 2013-09-26 NOTE — Progress Notes (Signed)
Pre-visit discussion using our clinic review tool. No additional management support is needed unless otherwise documented below in the visit note.  

## 2013-09-27 LAB — D-DIMER, QUANTITATIVE (NOT AT ARMC): D DIMER QUANT: 0.85 ug{FEU}/mL — AB (ref 0.00–0.48)

## 2013-09-30 ENCOUNTER — Telehealth: Payer: Self-pay | Admitting: Internal Medicine

## 2013-09-30 NOTE — Telephone Encounter (Signed)
pts daughter, [name] calling about labs from 09/26/13.  Has mychart but results are not posted. Please call [name] back at your earliest convenience, [phone].

## 2013-09-30 NOTE — Telephone Encounter (Signed)
Pt wife calling requesting results from blood work, labs were done on 09/26/13

## 2013-10-01 NOTE — Telephone Encounter (Signed)
Dr. Kirtland BouchardK spoke to pt's daughter.

## 2013-10-17 ENCOUNTER — Encounter (HOSPITAL_COMMUNITY)
Admission: EM | Disposition: A | Discharge status: Skilled Nursing Facility | Payer: Self-pay | Source: Home / Self Care | Attending: Internal Medicine

## 2013-10-17 ENCOUNTER — Inpatient Hospital Stay (HOSPITAL_COMMUNITY): Payer: Medicare Other

## 2013-10-17 ENCOUNTER — Emergency Department (HOSPITAL_COMMUNITY): Payer: Medicare Other

## 2013-10-17 ENCOUNTER — Encounter (HOSPITAL_COMMUNITY): Payer: Medicare Other | Admitting: Certified Registered"

## 2013-10-17 ENCOUNTER — Inpatient Hospital Stay (HOSPITAL_COMMUNITY): Payer: Medicare Other | Admitting: Certified Registered"

## 2013-10-17 ENCOUNTER — Encounter (HOSPITAL_COMMUNITY): Payer: Self-pay | Admitting: Emergency Medicine

## 2013-10-17 ENCOUNTER — Inpatient Hospital Stay (HOSPITAL_COMMUNITY)
Admission: EM | Admit: 2013-10-17 | Discharge: 2013-10-21 | DRG: 481 | Disposition: A | Discharge status: Skilled Nursing Facility | Payer: Medicare Other | Attending: Internal Medicine | Admitting: Internal Medicine

## 2013-10-17 DIAGNOSIS — R296 Repeated falls: Secondary | ICD-10-CM | POA: Diagnosis present

## 2013-10-17 DIAGNOSIS — I5032 Chronic diastolic (congestive) heart failure: Secondary | ICD-10-CM | POA: Diagnosis present

## 2013-10-17 DIAGNOSIS — Z8249 Family history of ischemic heart disease and other diseases of the circulatory system: Secondary | ICD-10-CM

## 2013-10-17 DIAGNOSIS — Z79899 Other long term (current) drug therapy: Secondary | ICD-10-CM

## 2013-10-17 DIAGNOSIS — I509 Heart failure, unspecified: Secondary | ICD-10-CM | POA: Diagnosis present

## 2013-10-17 DIAGNOSIS — N183 Chronic kidney disease, stage 3 unspecified: Secondary | ICD-10-CM | POA: Diagnosis present

## 2013-10-17 DIAGNOSIS — Z888 Allergy status to other drugs, medicaments and biological substances status: Secondary | ICD-10-CM

## 2013-10-17 DIAGNOSIS — S72009A Fracture of unspecified part of neck of unspecified femur, initial encounter for closed fracture: Secondary | ICD-10-CM | POA: Diagnosis present

## 2013-10-17 DIAGNOSIS — J90 Pleural effusion, not elsewhere classified: Secondary | ICD-10-CM | POA: Diagnosis present

## 2013-10-17 DIAGNOSIS — M24569 Contracture, unspecified knee: Secondary | ICD-10-CM | POA: Diagnosis present

## 2013-10-17 DIAGNOSIS — S72142A Displaced intertrochanteric fracture of left femur, initial encounter for closed fracture: Secondary | ICD-10-CM

## 2013-10-17 DIAGNOSIS — M24559 Contracture, unspecified hip: Secondary | ICD-10-CM | POA: Diagnosis present

## 2013-10-17 DIAGNOSIS — M109 Gout, unspecified: Secondary | ICD-10-CM | POA: Diagnosis present

## 2013-10-17 DIAGNOSIS — N400 Enlarged prostate without lower urinary tract symptoms: Secondary | ICD-10-CM | POA: Diagnosis present

## 2013-10-17 DIAGNOSIS — S72143A Displaced intertrochanteric fracture of unspecified femur, initial encounter for closed fracture: Principal | ICD-10-CM

## 2013-10-17 DIAGNOSIS — Y92009 Unspecified place in unspecified non-institutional (private) residence as the place of occurrence of the external cause: Secondary | ICD-10-CM

## 2013-10-17 DIAGNOSIS — I5030 Unspecified diastolic (congestive) heart failure: Secondary | ICD-10-CM | POA: Diagnosis present

## 2013-10-17 DIAGNOSIS — Z791 Long term (current) use of non-steroidal anti-inflammatories (NSAID): Secondary | ICD-10-CM

## 2013-10-17 DIAGNOSIS — I10 Essential (primary) hypertension: Secondary | ICD-10-CM | POA: Diagnosis present

## 2013-10-17 DIAGNOSIS — Z96649 Presence of unspecified artificial hip joint: Secondary | ICD-10-CM

## 2013-10-17 DIAGNOSIS — Z66 Do not resuscitate: Secondary | ICD-10-CM | POA: Diagnosis present

## 2013-10-17 DIAGNOSIS — I129 Hypertensive chronic kidney disease with stage 1 through stage 4 chronic kidney disease, or unspecified chronic kidney disease: Secondary | ICD-10-CM | POA: Diagnosis present

## 2013-10-17 HISTORY — PX: INTRAMEDULLARY (IM) NAIL INTERTROCHANTERIC: SHX5875

## 2013-10-17 LAB — BASIC METABOLIC PANEL
BUN: 33 mg/dL — ABNORMAL HIGH (ref 6–23)
CALCIUM: 9.1 mg/dL (ref 8.4–10.5)
CHLORIDE: 109 meq/L (ref 96–112)
CO2: 20 mEq/L (ref 19–32)
CREATININE: 1.37 mg/dL — AB (ref 0.50–1.35)
GFR, EST AFRICAN AMERICAN: 52 mL/min — AB (ref >90)
GFR, EST NON AFRICAN AMERICAN: 45 mL/min — AB (ref >90)
Glucose, Bld: 99 mg/dL (ref 70–99)
Potassium: 4.1 mEq/L (ref 3.7–5.3)
Sodium: 141 mEq/L (ref 137–147)

## 2013-10-17 LAB — CBC WITH DIFFERENTIAL/PLATELET
BASOS ABS: 0 10*3/uL (ref 0.0–0.1)
BASOS PCT: 0 % (ref 0–1)
Eosinophils Absolute: 0 10*3/uL (ref 0.0–0.7)
Eosinophils Relative: 0 % (ref 0–5)
HEMATOCRIT: 36.1 % — AB (ref 39.0–52.0)
Hemoglobin: 11.9 g/dL — ABNORMAL LOW (ref 13.0–17.0)
LYMPHS PCT: 11 % — AB (ref 12–46)
Lymphs Abs: 0.7 10*3/uL (ref 0.7–4.0)
MCH: 30.6 pg (ref 26.0–34.0)
MCHC: 33 g/dL (ref 30.0–36.0)
MCV: 92.8 fL (ref 78.0–100.0)
Monocytes Absolute: 0.5 10*3/uL (ref 0.1–1.0)
Monocytes Relative: 8 % (ref 3–12)
NEUTROS ABS: 5.3 10*3/uL (ref 1.7–7.7)
NEUTROS PCT: 81 % — AB (ref 43–77)
PLATELETS: 118 10*3/uL — AB (ref 150–400)
RBC: 3.89 MIL/uL — ABNORMAL LOW (ref 4.22–5.81)
RDW: 14.5 % (ref 11.5–15.5)
WBC: 6.5 10*3/uL (ref 4.0–10.5)

## 2013-10-17 LAB — PROTIME-INR
INR: 1.14 (ref 0.00–1.49)
PROTHROMBIN TIME: 14.4 s (ref 11.6–15.2)

## 2013-10-17 LAB — TYPE AND SCREEN
ABO/RH(D): A NEG
ANTIBODY SCREEN: NEGATIVE

## 2013-10-17 LAB — PRO B NATRIURETIC PEPTIDE: PRO B NATRI PEPTIDE: 214.5 pg/mL (ref 0–450)

## 2013-10-17 LAB — I-STAT TROPONIN, ED: Troponin i, poc: 0 ng/mL (ref 0.00–0.08)

## 2013-10-17 SURGERY — FIXATION, FRACTURE, INTERTROCHANTERIC, WITH INTRAMEDULLARY ROD
Anesthesia: General | Site: Hip | Laterality: Left

## 2013-10-17 MED ORDER — METOCLOPRAMIDE HCL 10 MG PO TABS: 5.0000 mg | ORAL_TABLET | Freq: Three times a day (TID) | ORAL | Status: DC | PRN

## 2013-10-17 MED ORDER — PROMETHAZINE HCL 25 MG/ML IJ SOLN
6.2500 mg | INTRAMUSCULAR | Status: DC | PRN
Start: 2013-10-17 — End: 2013-10-21
  Administered 2013-10-21: 6.25 mg via INTRAVENOUS
  Filled 2013-10-17: qty 1

## 2013-10-17 MED ORDER — SODIUM CHLORIDE 0.9 % IV SOLN
INTRAVENOUS | Status: DC
  Administered 2013-10-17: 21:00:00 via INTRAVENOUS
  Filled 2013-10-17 (×2): qty 1000

## 2013-10-17 MED ORDER — SODIUM CHLORIDE 0.9 % IJ SOLN: 3.0000 mL | Freq: Two times a day (BID) | INTRAMUSCULAR | Status: DC

## 2013-10-17 MED ORDER — PHENYLEPHRINE HCL 10 MG/ML IJ SOLN
INTRAMUSCULAR | Status: DC | PRN
  Administered 2013-10-17 (×3): 80 ug via INTRAVENOUS

## 2013-10-17 MED ORDER — TETANUS-DIPHTH-ACELL PERTUSSIS 5-2.5-18.5 LF-MCG/0.5 IM SUSP: 0.5000 mL | Freq: Once | INTRAMUSCULAR | Status: DC

## 2013-10-17 MED ORDER — NAPHAZOLINE HCL 0.1 % OP SOLN: 1.0000 [drp] | Freq: Four times a day (QID) | OPHTHALMIC | Status: DC | PRN

## 2013-10-17 MED ORDER — PHENOL 1.4 % MT LIQD: 1.0000 | OROMUCOSAL | Status: DC | PRN

## 2013-10-17 MED ORDER — FERROUS SULFATE 325 (65 FE) MG PO TABS
325.0000 mg | ORAL_TABLET | Freq: Two times a day (BID) | ORAL | Status: DC
  Administered 2013-10-18 – 2013-10-21 (×7): 325 mg via ORAL
  Filled 2013-10-17 (×9): qty 1

## 2013-10-17 MED ORDER — LACTATED RINGERS IV SOLN: INTRAVENOUS | Status: DC

## 2013-10-17 MED ORDER — METOCLOPRAMIDE HCL 5 MG/ML IJ SOLN: 5.0000 mg | Freq: Three times a day (TID) | INTRAMUSCULAR | Status: DC | PRN

## 2013-10-17 MED ORDER — 0.9 % SODIUM CHLORIDE (POUR BTL) OPTIME
TOPICAL | Status: DC | PRN
  Administered 2013-10-17: 1000 mL

## 2013-10-17 MED ORDER — MORPHINE SULFATE 4 MG/ML IJ SOLN
4.0000 mg | Freq: Once | INTRAMUSCULAR | Status: AC
  Administered 2013-10-17: 4 mg via INTRAVENOUS
  Filled 2013-10-17: qty 1

## 2013-10-17 MED ORDER — ENOXAPARIN SODIUM 40 MG/0.4ML ~~LOC~~ SOLN: 40.0000 mg | SUBCUTANEOUS | Status: DC

## 2013-10-17 MED ORDER — MENTHOL 3 MG MT LOZG: 1.0000 | LOZENGE | OROMUCOSAL | Status: DC | PRN

## 2013-10-17 MED ORDER — MAGNESIUM CITRATE PO SOLN: 0.5000 | Freq: Once | ORAL | Status: AC | PRN

## 2013-10-17 MED ORDER — PHENYLEPHRINE HCL 10 MG/ML IJ SOLN
INTRAMUSCULAR | Status: AC
  Filled 2013-10-17: qty 1

## 2013-10-17 MED ORDER — DEXAMETHASONE SODIUM PHOSPHATE 10 MG/ML IJ SOLN
INTRAMUSCULAR | Status: DC | PRN
  Administered 2013-10-17: 10 mg via INTRAVENOUS

## 2013-10-17 MED ORDER — EPHEDRINE SULFATE 50 MG/ML IJ SOLN
INTRAMUSCULAR | Status: AC
  Filled 2013-10-17: qty 1

## 2013-10-17 MED ORDER — FENTANYL CITRATE 0.05 MG/ML IJ SOLN
INTRAMUSCULAR | Status: DC | PRN
  Administered 2013-10-17: 100 ug via INTRAVENOUS
  Administered 2013-10-17 (×3): 50 ug via INTRAVENOUS

## 2013-10-17 MED ORDER — SODIUM CHLORIDE 0.9 % IV SOLN
Freq: Once | INTRAVENOUS | Status: AC
  Administered 2013-10-17: 12:00:00 via INTRAVENOUS

## 2013-10-17 MED ORDER — ONDANSETRON HCL 4 MG PO TABS: 4.0000 mg | ORAL_TABLET | Freq: Four times a day (QID) | ORAL | Status: DC | PRN

## 2013-10-17 MED ORDER — ACETAMINOPHEN 650 MG RE SUPP: 650.0000 mg | Freq: Four times a day (QID) | RECTAL | Status: DC | PRN

## 2013-10-17 MED ORDER — FENTANYL CITRATE 0.05 MG/ML IJ SOLN
25.0000 ug | INTRAMUSCULAR | Status: DC | PRN
  Administered 2013-10-20: 25 ug via INTRAVENOUS
  Administered 2013-10-20 – 2013-10-21 (×4): 50 ug via INTRAVENOUS
  Filled 2013-10-17 (×6): qty 2

## 2013-10-17 MED ORDER — MORPHINE SULFATE 2 MG/ML IJ SOLN
0.5000 mg | INTRAMUSCULAR | Status: DC | PRN
  Administered 2013-10-20: 0.5 mg via INTRAVENOUS
  Filled 2013-10-17 (×2): qty 1

## 2013-10-17 MED ORDER — HYDROCODONE-ACETAMINOPHEN 5-325 MG PO TABS: 1.0000 | ORAL_TABLET | Freq: Four times a day (QID) | ORAL | Status: DC | PRN

## 2013-10-17 MED ORDER — TAMSULOSIN HCL 0.4 MG PO CAPS
0.4000 mg | ORAL_CAPSULE | Freq: Two times a day (BID) | ORAL | Status: DC
  Administered 2013-10-17 – 2013-10-21 (×8): 0.4 mg via ORAL
  Filled 2013-10-17 (×9): qty 1

## 2013-10-17 MED ORDER — PROPOFOL 10 MG/ML IV BOLUS
INTRAVENOUS | Status: AC
  Filled 2013-10-17: qty 20

## 2013-10-17 MED ORDER — PROPOFOL 10 MG/ML IV BOLUS
INTRAVENOUS | Status: DC | PRN
  Administered 2013-10-17: 100 mg via INTRAVENOUS

## 2013-10-17 MED ORDER — CEFAZOLIN SODIUM-DEXTROSE 2-3 GM-% IV SOLR
INTRAVENOUS | Status: DC | PRN
  Administered 2013-10-17: 2 g via INTRAVENOUS

## 2013-10-17 MED ORDER — EPHEDRINE SULFATE 50 MG/ML IJ SOLN
INTRAMUSCULAR | Status: DC | PRN
  Administered 2013-10-17: 10 mg via INTRAVENOUS

## 2013-10-17 MED ORDER — ENOXAPARIN SODIUM 30 MG/0.3ML ~~LOC~~ SOLN
30.0000 mg | SUBCUTANEOUS | Status: DC
  Filled 2013-10-17 (×2): qty 0.3

## 2013-10-17 MED ORDER — DEXTROSE 5 % IV SOLN
1.0000 g | INTRAVENOUS | Status: DC
  Administered 2013-10-17 – 2013-10-20 (×4): 1 g via INTRAVENOUS
  Filled 2013-10-17 (×5): qty 10

## 2013-10-17 MED ORDER — DEXAMETHASONE SODIUM PHOSPHATE 10 MG/ML IJ SOLN
INTRAMUSCULAR | Status: AC
  Filled 2013-10-17: qty 1

## 2013-10-17 MED ORDER — DOCUSATE SODIUM 100 MG PO CAPS
100.0000 mg | ORAL_CAPSULE | Freq: Two times a day (BID) | ORAL | Status: DC
  Administered 2013-10-17 – 2013-10-21 (×8): 100 mg via ORAL
  Filled 2013-10-17 (×9): qty 1

## 2013-10-17 MED ORDER — FENTANYL CITRATE 0.05 MG/ML IJ SOLN
INTRAMUSCULAR | Status: AC
  Filled 2013-10-17: qty 5

## 2013-10-17 MED ORDER — SODIUM CHLORIDE 0.9 % IJ SOLN
INTRAMUSCULAR | Status: AC
  Filled 2013-10-17: qty 10

## 2013-10-17 MED ORDER — ONDANSETRON HCL 4 MG/2ML IJ SOLN
4.0000 mg | Freq: Four times a day (QID) | INTRAMUSCULAR | Status: DC | PRN
  Administered 2013-10-19: 4 mg via INTRAVENOUS
  Filled 2013-10-17: qty 2

## 2013-10-17 MED ORDER — FENTANYL CITRATE 0.05 MG/ML IJ SOLN: 50.0000 ug | Freq: Once | INTRAMUSCULAR | Status: DC

## 2013-10-17 MED ORDER — POTASSIUM CHLORIDE CRYS ER 10 MEQ PO TBCR
10.0000 meq | EXTENDED_RELEASE_TABLET | Freq: Every day | ORAL | Status: DC
  Administered 2013-10-17: 10 meq via ORAL
  Filled 2013-10-17 (×2): qty 1

## 2013-10-17 MED ORDER — ONDANSETRON HCL 4 MG/2ML IJ SOLN
INTRAMUSCULAR | Status: AC
  Filled 2013-10-17: qty 2

## 2013-10-17 MED ORDER — ALLOPURINOL 100 MG PO TABS
100.0000 mg | ORAL_TABLET | Freq: Every day | ORAL | Status: DC
  Administered 2013-10-17 – 2013-10-21 (×5): 100 mg via ORAL
  Filled 2013-10-17 (×5): qty 1

## 2013-10-17 MED ORDER — PHENYLEPHRINE 40 MCG/ML (10ML) SYRINGE FOR IV PUSH (FOR BLOOD PRESSURE SUPPORT)
PREFILLED_SYRINGE | INTRAVENOUS | Status: AC
  Filled 2013-10-17: qty 10

## 2013-10-17 MED ORDER — ONDANSETRON HCL 4 MG/2ML IJ SOLN
INTRAMUSCULAR | Status: DC | PRN
  Administered 2013-10-17: 4 mg via INTRAVENOUS

## 2013-10-17 MED ORDER — SUCCINYLCHOLINE CHLORIDE 20 MG/ML IJ SOLN
INTRAMUSCULAR | Status: AC
  Filled 2013-10-17: qty 1

## 2013-10-17 MED ORDER — HYDROCODONE-ACETAMINOPHEN 5-325 MG PO TABS
1.0000 | ORAL_TABLET | Freq: Four times a day (QID) | ORAL | Status: DC | PRN
  Administered 2013-10-18 – 2013-10-19 (×4): 1 via ORAL
  Filled 2013-10-17 (×4): qty 1

## 2013-10-17 MED ORDER — PHENYLEPHRINE HCL 10 MG/ML IJ SOLN
10.0000 mg | INTRAVENOUS | Status: DC | PRN
  Administered 2013-10-17: 50 ug/min via INTRAVENOUS

## 2013-10-17 MED ORDER — FENTANYL CITRATE 0.05 MG/ML IJ SOLN: 50.0000 ug | INTRAMUSCULAR | Status: DC | PRN

## 2013-10-17 MED ORDER — ALUM & MAG HYDROXIDE-SIMETH 200-200-20 MG/5ML PO SUSP: 30.0000 mL | ORAL | Status: DC | PRN

## 2013-10-17 MED ORDER — POLYETHYLENE GLYCOL 3350 17 G PO PACK
17.0000 g | PACK | Freq: Every day | ORAL | Status: DC | PRN
  Filled 2013-10-17 (×2): qty 1

## 2013-10-17 MED ORDER — ACETAMINOPHEN 325 MG PO TABS
650.0000 mg | ORAL_TABLET | Freq: Four times a day (QID) | ORAL | Status: DC | PRN
  Administered 2013-10-19 – 2013-10-20 (×3): 650 mg via ORAL
  Filled 2013-10-17 (×3): qty 2

## 2013-10-17 MED ORDER — CEFAZOLIN SODIUM-DEXTROSE 2-3 GM-% IV SOLR
INTRAVENOUS | Status: AC
  Filled 2013-10-17: qty 50

## 2013-10-17 MED ORDER — SUCCINYLCHOLINE CHLORIDE 20 MG/ML IJ SOLN
INTRAMUSCULAR | Status: DC | PRN
  Administered 2013-10-17: 80 mg via INTRAVENOUS

## 2013-10-17 MED ORDER — DEXTROSE 5 % IV SOLN
250.0000 mg | INTRAVENOUS | Status: DC
  Administered 2013-10-17 – 2013-10-19 (×3): 250 mg via INTRAVENOUS
  Filled 2013-10-17 (×4): qty 250

## 2013-10-17 MED ORDER — LACTATED RINGERS IV SOLN
INTRAVENOUS | Status: DC | PRN
  Administered 2013-10-17: 14:00:00 via INTRAVENOUS

## 2013-10-17 SURGICAL SUPPLY — 42 items
BAG SPEC THK2 15X12 ZIP CLS (MISCELLANEOUS)
BAG ZIPLOCK 12X15 (MISCELLANEOUS) ×1 IMPLANT
BIT DRILL CANN LG 4.3MM (BIT) IMPLANT
BNDG GAUZE ELAST 4 BULKY (GAUZE/BANDAGES/DRESSINGS) ×2 IMPLANT
DRAPE INCISE IOBAN 66X45 STRL (DRAPES) ×2 IMPLANT
DRAPE STERI IOBAN 125X83 (DRAPES) ×2 IMPLANT
DRILL BIT CANN LG 4.3MM (BIT) ×2
DRSG AQUACEL AG ADV 3.5X 4 (GAUZE/BANDAGES/DRESSINGS) ×4 IMPLANT
DRSG AQUACEL AG ADV 3.5X 6 (GAUZE/BANDAGES/DRESSINGS) ×1 IMPLANT
DRSG MEPILEX BORDER 4X12 (GAUZE/BANDAGES/DRESSINGS) ×2 IMPLANT
DURAPREP 26ML APPLICATOR (WOUND CARE) ×2 IMPLANT
ELECT REM PT RETURN 9FT ADLT (ELECTROSURGICAL) ×2
ELECTRODE REM PT RTRN 9FT ADLT (ELECTROSURGICAL) ×1 IMPLANT
GLOVE BIOGEL PI IND STRL 7.5 (GLOVE) ×1 IMPLANT
GLOVE BIOGEL PI IND STRL 8 (GLOVE) ×1 IMPLANT
GLOVE BIOGEL PI INDICATOR 7.5 (GLOVE) ×1
GLOVE BIOGEL PI INDICATOR 8 (GLOVE) ×1
GLOVE ECLIPSE 8.0 STRL XLNG CF (GLOVE) ×1 IMPLANT
GLOVE ORTHO TXT STRL SZ7.5 (GLOVE) ×4 IMPLANT
GLOVE SURG ORTHO 8.0 STRL STRW (GLOVE) ×1 IMPLANT
GOWN SPEC L3 XXLG W/TWL (GOWN DISPOSABLE) ×2 IMPLANT
GOWN STRL REUS W/TWL LRG LVL3 (GOWN DISPOSABLE) ×2 IMPLANT
GUIDEPIN 3.2X17.5 THRD DISP (PIN) ×1 IMPLANT
HFN 125 DEG 11MM X 180MM (Orthopedic Implant) ×1 IMPLANT
HIP FRA NAIL LAG SCREW 10.5X90 (Orthopedic Implant) ×2 IMPLANT
KIT BASIN OR (CUSTOM PROCEDURE TRAY) ×2 IMPLANT
MANIFOLD NEPTUNE II (INSTRUMENTS) ×2 IMPLANT
NS IRRIG 1000ML POUR BTL (IV SOLUTION) ×1 IMPLANT
PACK GENERAL/GYN (CUSTOM PROCEDURE TRAY) ×2 IMPLANT
PAD ABD 8X10 STRL (GAUZE/BANDAGES/DRESSINGS) ×2 IMPLANT
POSITIONER SURGICAL ARM (MISCELLANEOUS) ×2 IMPLANT
SCREW BONE CORTICAL 5.0X36 (Screw) ×1 IMPLANT
SCREW LAG HIP FRA NAIL 10.5X90 (Orthopedic Implant) IMPLANT
SPONGE GAUZE 4X4 12PLY (GAUZE/BANDAGES/DRESSINGS) ×1 IMPLANT
STAPLER VISISTAT (STAPLE) ×1 IMPLANT
STAPLER VISISTAT 35W (STAPLE) ×1 IMPLANT
SUT MNCRL AB 4-0 PS2 18 (SUTURE) ×1 IMPLANT
SUT VIC AB 1 CT1 27 (SUTURE) ×2
SUT VIC AB 1 CT1 27XBRD ANTBC (SUTURE) ×1 IMPLANT
SUT VIC AB 2-0 CT1 27 (SUTURE) ×2
SUT VIC AB 2-0 CT1 27XBRD (SUTURE) ×2 IMPLANT
TOWEL OR 17X26 10 PK STRL BLUE (TOWEL DISPOSABLE) ×3 IMPLANT

## 2013-10-17 NOTE — ED Notes (Signed)
Initial Contact - pt ret from radiology at this time, family at bs.  Pt reports 4/10 pain to L hip at this time, assisted to reposition but declines further intervention at this time.  Pt a+ox4, skin PWD.  +csm/+pulses.  Pt denies CP/SOB or other complaints.  NAD.  inpt MD at bs for eval.

## 2013-10-17 NOTE — Discharge Instructions (Signed)
Partial weight bearing left lower extremity until further directed ° °May shower just keep wounds dry °

## 2013-10-17 NOTE — ED Notes (Signed)
Bed: WA05 Expected date:  Expected time:  Means of arrival:  Comments: EMS/Fall 

## 2013-10-17 NOTE — Anesthesia Postprocedure Evaluation (Signed)
  Anesthesia Post-op Note  Patient: [name]  Procedure(s) Performed: Procedure(s) (LRB): INTRAMEDULLARY (IM) NAIL INTERTROCHANTRIC (Left)  Patient Location: PACU  Anesthesia Type: General  Level of Consciousness: awake and alert   Airway and Oxygen Therapy: Patient Spontanous Breathing  Post-op Pain: mild  Post-op Assessment: Post-op Vital signs reviewed, Patient's Cardiovascular Status Stable, Respiratory Function Stable, Patent Airway and No signs of Nausea or vomiting  Last Vitals:  Filed Vitals:   10/17/13 1314  BP: 162/87  Pulse: 97  Temp: 37.1 C  Resp: 16    Post-op Vital Signs: stable   Complications: No apparent anesthesia complications

## 2013-10-17 NOTE — ED Notes (Signed)
Per EMS: Pt fell going to the bathroom this morning.  Was on the floor for about an hour.  Denies hitting head, denies LOC.  Lt hip pain, possible outer rotation.  Lt knee pain.  Hx of gout and arthritis of knees.  Last march, had rt hip replacement.  Abrasion to lt shoulder.

## 2013-10-17 NOTE — Anesthesia Preprocedure Evaluation (Addendum)
Anesthesia Evaluation  Patient identified by MRN, date of birth, ID band Patient awake    Reviewed: Allergy & Precautions, H&P , NPO status , Patient's Chart, lab work & pertinent test results  Airway Mallampati: II TM Distance: >3 FB Neck ROM: Limited    Dental no notable dental hx.    Pulmonary neg pulmonary ROS,  breath sounds clear to auscultation  Pulmonary exam normal       Cardiovascular hypertension, Rhythm:Regular Rate:Normal     Neuro/Psych negative neurological ROS  negative psych ROS   GI/Hepatic negative GI ROS, Neg liver ROS,   Endo/Other  negative endocrine ROS  Renal/GU negative Renal ROS  negative genitourinary   Musculoskeletal negative musculoskeletal ROS (+)   Abdominal   Peds negative pediatric ROS (+)  Hematology  (+) anemia ,   Anesthesia Other Findings   Reproductive/Obstetrics negative OB ROS                          Anesthesia Physical Anesthesia Plan  ASA: II  Anesthesia Plan: General   Post-op Pain Management:    Induction: Intravenous  Airway Management Planned: Oral ETT  Additional Equipment:   Intra-op Plan:   Post-operative Plan: Extubation in OR  Informed Consent: I have reviewed the patients History and Physical, chart, labs and discussed the procedure including the risks, benefits and alternatives for the proposed anesthesia with the patient or authorized representative who has indicated his/her understanding and acceptance.   Dental advisory given  Plan Discussed with: CRNA and Surgeon  Anesthesia Plan Comments: (Very hard to awaken with other surgeries. Last year had Desflurane and Cisatracurium, will use for this procedure as well)       Anesthesia Quick Evaluation

## 2013-10-17 NOTE — Preoperative (Signed)
Beta Blockers   Reason not to administer Beta Blockers:Not Applicable 

## 2013-10-17 NOTE — Progress Notes (Signed)
1850- pt arrived to 4th floor accompanied with PACU RN. Pt alert and oriented x 4. Denies pain.  Dressing to lt. Hip is stained and intact. Will continue to assess and monitor.

## 2013-10-17 NOTE — ED Provider Notes (Signed)
CSN: [id number]     Arrival date & time 10/17/13  [id number] History   First MD Initiated Contact with Patient 10/17/13 0825     Chief Complaint  Patient presents with  . Fall  . Hip Pain  . Knee Pain     (Consider location/radiation/quality/duration/timing/severity/associated sxs/prior Treatment) HPI Comments: Patient is an 80 year old male with history of gout, BPH, hypertension, right hip replacement who presents today after a fall. He reports he was walking to the bathroom when he fell. He is not sure why he fell, but remembers the whole thing. There was no loss of consciousness. Currently he is complaining of left hip and knee pain. His pain is significantly improved after receiving Fentanyl by EMS. He has prior right THA done by Dr. Charlann Boxerlin one year ago. He last ate or drank last night. He is not on any blood thinners. He denies any shortness of breath or cough.   The history is provided by the patient. No language interpreter was used.    Past Medical History  Diagnosis Date  . Gout   . Insomnia   . BPH (benign prostatic hyperplasia)   . Kidney disorder   . Arthritis   . Hypertension     patient denies on 10/25/12 on no meds   . Complication of anesthesia     hard to wake up -2011 after hernia surgery    Past Surgical History  Procedure Laterality Date  . Cataract extraction    . Cholecystectomy    . Hernia repair  02/22/11    right  . Joint replacement      fractured hip  . Total hip arthroplasty Right 10/29/2012    Procedure: CONVERSION OF PREVIOUS HIP SURGERY TO RIGHT TOTAL HIP AND REMOVAL OF IM NAIL ;  Surgeon: Shelda PalMatthew D Olin, MD;  Location: [facility];  Service: Orthopedics;  Laterality: Right;  . Esophagogastroduodenoscopy N/A 11/16/2012    Procedure: ESOPHAGOGASTRODUODENOSCOPY (EGD);  Surgeon: Theda BelfastPatrick D Hung, MD;  Location: [facility];  Service: Endoscopy;  Laterality: N/A;   Family History  Problem Relation Age of Onset  . Heart failure Mother   . Heart failure Father   .  Diabetes Neg Hx    History  Substance Use Topics  . Smoking status: Never Smoker   . Smokeless tobacco: Never Used  . Alcohol Use: No    Review of Systems  Constitutional: Negative for fever and chills.  Respiratory: Negative for cough and shortness of breath.   Cardiovascular: Negative for chest pain.  Gastrointestinal: Negative for nausea, vomiting and abdominal pain.  Musculoskeletal: Positive for arthralgias, gait problem and myalgias.  Neurological: Negative for dizziness and headaches.  All other systems reviewed and are negative.      Allergies  Nsaids  Home Medications   Current Outpatient Rx  Name  Route  Sig  Dispense  Refill  . acetaminophen (TYLENOL) 500 MG tablet   Oral   Take 500 mg by mouth once.         Marland Kitchen. allopurinol (ZYLOPRIM) 100 MG tablet      TAKE 1 TABLET (100 MG TOTAL) BY MOUTH DAILY.   90 tablet   3   . cefUROXime (CEFTIN) 250 MG tablet   Oral   Take 1 tablet (250 mg total) by mouth 2 (two) times daily with a meal.   14 tablet   0   . ferrous sulfate 325 (65 FE) MG tablet   Oral   Take 325 mg by mouth every morning.         .Marland Kitchen  tamsulosin (FLOMAX) 0.4 MG CAPS   Oral   Take 0.4 mg by mouth 2 (two) times daily.           BP 140/76  Pulse 89  Temp(Src) 98.7 F (37.1 C) (Oral)  Resp 19  SpO2 95% Physical Exam  Nursing note and vitals reviewed. Constitutional: He is oriented to person, place, and time. He appears well-developed and well-nourished. No distress.  HENT:  Head: Normocephalic and atraumatic.  Right Ear: External ear normal.  Left Ear: External ear normal.  Nose: Nose normal.  Eyes: Conjunctivae and EOM are normal. Pupils are equal, round, and reactive to light.  Neck: Normal range of motion. No spinous process tenderness and no muscular tenderness present. No tracheal deviation present.  Cardiovascular: Normal rate, regular rhythm, normal heart sounds, intact distal pulses and normal pulses.   Pulses:       Dorsalis pedis pulses are 2+ on the right side, and 2+ on the left side.       Posterior tibial pulses are 2+ on the right side, and 2+ on the left side.  Pulmonary/Chest: Effort normal and breath sounds normal. No stridor.  Abdominal: Soft. He exhibits no distension. There is no tenderness.  Musculoskeletal: Normal range of motion.  ttp over left hip and knee. Compartment soft. Neurovascularly intact.   Neurological: He is alert and oriented to person, place, and time.  Skin: Skin is warm and dry. He is not diaphoretic.  Psychiatric: He has a normal mood and affect. His behavior is normal.    ED Course  Procedures (including critical care time) Labs Review Labs Reviewed - No data to display Imaging Review Dg Chest 2 View  10/17/2013   CLINICAL DATA:  Hip fracture.  Hypertension.  EXAM: CHEST  2 VIEW  COMPARISON:  DG CHEST 2 VIEW dated 03/17/2013  FINDINGS: Mediastinum and hilar structures are normal. Large right pleural effusion is present. Underlying pulmonary infiltrate cannot be excluded. Low lung volumes with basilar atelectasis. Infiltrate left lung base cannot be excluded. Heart size normal. No pneumothorax. No acute bony abnormality. Degenerative changes both shoulders. Degenerative changes thoracic spine.  IMPRESSION: 1. Large right pleural effusion. Underlying pulmonary infiltrate on the right cannot be excluded . 2. Low lung volumes with basilar atelectasis. Infiltrate left lung base also cannot be excluded.   Electronically Signed   By: Maisie Fus  Register   On: 10/17/2013 10:35   Dg Shoulder 1v Left  10/17/2013   CLINICAL DATA:  Fall, left shoulder abrasion, hip fracture  EXAM: LEFT SHOULDER - 1 VIEW  COMPARISON:  12/11/2011  FINDINGS: Osseous demineralization.  AC joint degenerative changes.  No acute fracture or dislocation identified on single AP view.  Question chronic rotator cuff tear.  Slight cortical irregularity at the proximal humerus, likely related to the deltoid tubercle or  potentially a small osteochondroma.  Visualized left ribs intact.  IMPRESSION: Osseous demineralization with AC joint degenerative changes and question chronic rotator cuff tear.  No acute abnormalities.   Electronically Signed   By: Ulyses Southward M.D.   On: 10/17/2013 09:49   Dg Hip Complete Left  10/17/2013   CLINICAL DATA:  Left hip pain post fall  EXAM: LEFT HIP - COMPLETE 2+ VIEW  COMPARISON:  Pelvic radiograph 10/29/2012  FINDINGS: Right hip prosthesis.  Interval displaced low left femoral neck fracture without definite intertrochanteric extension.  Mild varus angulation.  Pelvis appears intact.  SI joints symmetric.  No additional fractures identified.  IMPRESSION: Displaced left femoral neck  fracture.  Right hip prosthesis.  Osseous demineralization.   Electronically Signed   By: Ulyses Southward M.D.   On: 10/17/2013 09:11   Ct Head Wo Contrast  10/17/2013   CLINICAL DATA:  Fall.  EXAM: CT HEAD WITHOUT CONTRAST  CT CERVICAL SPINE WITHOUT CONTRAST  TECHNIQUE: Multidetector CT imaging of the head and cervical spine was performed following the standard protocol without intravenous contrast. Multiplanar CT image reconstructions of the cervical spine were also generated.  COMPARISON:  09/04/2012.  FINDINGS: CT HEAD FINDINGS  No skull fracture or intracranial hemorrhage.  Small vessel disease type changes without CT evidence of large acute infarct.  No intracranial mass lesion noted on this unenhanced exam.  Vascular calcifications.  CT CERVICAL SPINE FINDINGS  No cervical spine fracture or malalignment. No abnormal prevertebral soft tissue swelling.  Cervical spondylotic changes including prominent transverse ligament hypertrophy. Spinal stenosis most prominent C2-3 follow-up by C5-6 and C6-7 level. If there is any clinical suspicion of ligamentous injury or cord injury, MR can be obtained for further delineation.  Moderate size right-sided pleural effusion incompletely assessed on present exam.  Heterogeneous  thyroid gland.  IMPRESSION: CT HEAD:  No skull fracture or intracranial hemorrhage.  CT CERVICAL SPINE:  No cervical spine fracture or malalignment. No abnormal prevertebral soft tissue swelling.  Cervical spondylotic changes including prominent transverse ligament hypertrophy. Spinal stenosis most prominent C2-3 follow-up by C5-6 and C6-7 level. If there is any clinical suspicion of ligamentous injury or cord injury, MR can be obtained for further delineation.  Moderate size right-sided pleural effusion incompletely assessed on present exam.   Electronically Signed   By: Bridgett Larsson M.D.   On: 10/17/2013 09:32   Ct Cervical Spine Wo Contrast  10/17/2013   CLINICAL DATA:  Fall.  EXAM: CT HEAD WITHOUT CONTRAST  CT CERVICAL SPINE WITHOUT CONTRAST  TECHNIQUE: Multidetector CT imaging of the head and cervical spine was performed following the standard protocol without intravenous contrast. Multiplanar CT image reconstructions of the cervical spine were also generated.  COMPARISON:  09/04/2012.  FINDINGS: CT HEAD FINDINGS  No skull fracture or intracranial hemorrhage.  Small vessel disease type changes without CT evidence of large acute infarct.  No intracranial mass lesion noted on this unenhanced exam.  Vascular calcifications.  CT CERVICAL SPINE FINDINGS  No cervical spine fracture or malalignment. No abnormal prevertebral soft tissue swelling.  Cervical spondylotic changes including prominent transverse ligament hypertrophy. Spinal stenosis most prominent C2-3 follow-up by C5-6 and C6-7 level. If there is any clinical suspicion of ligamentous injury or cord injury, MR can be obtained for further delineation.  Moderate size right-sided pleural effusion incompletely assessed on present exam.  Heterogeneous thyroid gland.  IMPRESSION: CT HEAD:  No skull fracture or intracranial hemorrhage.  CT CERVICAL SPINE:  No cervical spine fracture or malalignment. No abnormal prevertebral soft tissue swelling.  Cervical  spondylotic changes including prominent transverse ligament hypertrophy. Spinal stenosis most prominent C2-3 follow-up by C5-6 and C6-7 level. If there is any clinical suspicion of ligamentous injury or cord injury, MR can be obtained for further delineation.  Moderate size right-sided pleural effusion incompletely assessed on present exam.   Electronically Signed   By: Bridgett Larsson M.D.   On: 10/17/2013 09:32   Ct Pelvis Wo Contrast  10/17/2013   CLINICAL DATA:  Left hip fracture.  EXAM: CT PELVIS WITHOUT CONTRAST  TECHNIQUE: Multidetector CT imaging of the pelvis was performed following the standard protocol without intravenous contrast.  COMPARISON:  Plain  films same day as well as 02/22/2013.  FINDINGS: Patient's right total hip arthroplasty is intact and normally located. This causes moderate streak artifact over the pelvis on the axial images. There is a displaced, minimally comminuted intertrochanteric fracture of the left hip with exaggerated coxa vara deformity about the fracture site. No evidence of hip dislocation. There is mild edematous change within the left gluteal muscles versus atrophy of the right gluteal muscles. . Mild degenerative change of the left hip. There are degenerative changes of the spine with mild grade 1 anterolisthesis of L4 on L5 unchanged.  There is diverticulosis of the colon. Is minimal calcified plaque over the abdominal aorta and iliac vessels.  IMPRESSION: Displaced slightly comminuted left intertrochanteric fracture.   Electronically Signed   By: Elberta Fortis M.D.   On: 10/17/2013 11:55   Dg Knee Complete 4 Views Left  10/17/2013   CLINICAL DATA:  Left hip fracture post fall  EXAM: LEFT KNEE - COMPLETE 4+ VIEW  COMPARISON:  None.  FINDINGS: Osseous demineralization.  Tricompartmental joint space narrowing and minimal spur formation greatest at patellofemoral joint.  Minimal knee joint effusion.  Small calcified loose bodies at suprapatellar recess.  Scattered  atherosclerotic calcification.  No acute fracture, dislocation, or bone destruction.  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  Small knee joint effusion with calcified loose bodies at suprapatellar recess.   Electronically Signed   By: Ulyses Southward M.D.   On: 10/17/2013 09:08    EKG Interpretation    Date/Time:  Thursday October 17 2013 09:50:50 EST Ventricular Rate:  90 PR Interval:  136 QRS Duration: 73 QT Interval:  349 QTC Calculation: 427 R Axis:   -16 Text Interpretation:  Sinus rhythm Low voltage, extremity leads Abnormal R-wave progression, early transition No significant change since last tracing Confirmed by GOLDSTON  MD, SCOTT ([id number]) on 10/17/2013 9:55:24 AM            MDM   Final diagnoses:  Femoral neck fracture  Pleural effusion, right   Patient is an 80 year old male who presents today after a fall. He has a displaced, comminuted femoral neck fracture. He also has pleural effusion on right which appears to be asymptomatic. Patient is laying comfortably in bed. Denies any shortness of breath. Oxygen saturation is 95% on room air. Discussed case with Dr. Rennis Chris as Dr. Charlann Boxer is patient's orthopedic physician. Surgery will likely be today. Patient admitted to medicine. Dr. Criss Alvine evaluated patient and agrees with plan. Vital signs stable at this time. Patient / Family / Caregiver informed of clinical course, understand medical decision-making process, and agree with plan.     Mora Bellman, PA-C 10/17/13 1227

## 2013-10-17 NOTE — Transfer of Care (Signed)
Immediate Anesthesia Transfer of Care Note  Patient: [name]  Procedure(s) Performed: Procedure(s) (LRB): INTRAMEDULLARY (IM) NAIL INTERTROCHANTRIC (Left)  Patient Location: PACU  Anesthesia Type: General  Level of Consciousness: sedated, patient cooperative and responds to stimulation  Airway & Oxygen Therapy: Patient Spontanous Breathing and Patient connected to face mask oxgen  Post-op Assessment: Report given to PACU RN and Post -op Vital signs reviewed and stable  Post vital signs: Reviewed and stable  Complications: No apparent anesthesia complications

## 2013-10-17 NOTE — H&P (Signed)
History and Physical    [name] WGN:[id number] DOB: 1924-10-30 DOA: 10/17/2013  Referring physician: Dr. Junious Silk, PA PCP: Rogelia Boga, MD  Specialists: Orthopedic surgery, Dr. Charlann Boxer  Chief Complaint: Fall  HPI: [name] is a 80 y.o. male has a past medical history significant for hypertension, BPH, congestive diastolic heart failure, CKD stage III with baseline creatinine around 1.6-1.9-2.0 as far back as 2009, and other problems as per below, presents to the emergency room with a chief complaint of a fall in his home. Patient denies any chest pain, syncopal episode, loss of consciousness. He denies any fever or chills. He has no cough. He reports mild episode of wheezing a week ago and mild cough. He denies any lightheadedness or dizziness, denies any headache. He has had falls in the past. He denies any burning with diminished or dysuria. In the emergency room, patient was found to have a displaced left intertrochanteric fracture. Orthopedic surgery has been consulted by the ED physician, and [facility] has been asked to admit.   Review of Systems: As per history of present illness, otherwise negative  Past Medical History  Diagnosis Date  . Gout   . Insomnia   . BPH (benign prostatic hyperplasia)   . Kidney disorder   . Arthritis   . Hypertension     patient denies on 10/25/12 on no meds   . Complication of anesthesia     hard to wake up -2011 after hernia surgery    Past Surgical History  Procedure Laterality Date  . Cataract extraction    . Cholecystectomy    . Hernia repair  02/22/11    right  . Joint replacement      fractured hip  . Total hip arthroplasty Right 10/29/2012    Procedure: CONVERSION OF PREVIOUS HIP SURGERY TO RIGHT TOTAL HIP AND REMOVAL OF IM NAIL ;  Surgeon: Shelda Pal, MD;  Location: [facility];  Service: Orthopedics;  Laterality: Right;  . Esophagogastroduodenoscopy N/A 11/16/2012    Procedure: ESOPHAGOGASTRODUODENOSCOPY (EGD);   Surgeon: Theda Belfast, MD;  Location: [facility];  Service: Endoscopy;  Laterality: N/A;   Social History:  reports that he has never smoked. He has never used smokeless tobacco. He reports that he does not drink alcohol or use illicit drugs.  Allergies  Allergen Reactions  . Nsaids Other (See Comments)    history of renal failure    Family History  Problem Relation Age of Onset  . Heart failure Mother   . Heart failure Father   . Diabetes Neg Hx      Prior to Admission medications   Medication Sig Start Date End Date Taking? Authorizing Provider  allopurinol (ZYLOPRIM) 100 MG tablet Take 100 mg by mouth daily.   Yes Historical Provider, MD  ibuprofen (ADVIL,MOTRIN) 200 MG tablet Take 200 mg by mouth every 6 (six) hours as needed for moderate pain.   Yes Historical Provider, MD  naphazoline-glycerin (CLEAR EYES) 0.012-0.2 % SOLN Place 1-2 drops into both eyes every 4 (four) hours as needed for irritation.   Yes Historical Provider, MD  potassium chloride (K-DUR,KLOR-CON) 10 MEQ tablet Take 10 mEq by mouth daily.   Yes Historical Provider, MD  tamsulosin (FLOMAX) 0.4 MG CAPS Take 0.4 mg by mouth 2 (two) times daily.  09/07/12  Yes Gordy Savers, MD  enoxaparin (LOVENOX) 40 MG/0.4ML injection Inject 0.4 mLs (40 mg total) into the skin daily. 10/17/13   Shelda Pal, MD  HYDROcodone-acetaminophen (  NORCO) 5-325 MG per tablet Take 1-2 tablets by mouth every 6 (six) hours as needed. 10/17/13   Shelda Pal, MD   Physical Exam: Filed Vitals:   10/17/13 1645 10/17/13 1656 10/17/13 1700 10/17/13 1715  BP: 84/45 80/41 92/51  95/44  Pulse: 79 81 80 75  Temp:      TempSrc:      Resp: 7 9 13 7   SpO2: 91% 93% 95% 96%     General:  No apparent distress  Eyes: PERRL, EOMI, no scleral icterus  ENT: moist oropharynx  Neck: supple, no JVD  Cardiovascular: regular rate without MRG; 2+ peripheral pulses  Respiratory: Decreased breath sounds on the right lower base, no wheezing,  no crackles  Abdomen: soft, non tender to palpation, positive bowel sounds, no guarding, no rebound  Skin: no rashes  Musculoskeletal: no peripheral edema  Psychiatric: normal mood and affect  Neurologic: Nonfocal  Labs on Admission:  Basic Metabolic Panel:  Recent Labs Lab 10/17/13 0950  NA 141  K 4.1  CL 109  CO2 20  GLUCOSE 99  BUN 33*  CREATININE 1.37*  CALCIUM 9.1   CBC:  Recent Labs Lab 10/17/13 0950  WBC 6.5  NEUTROABS 5.3  HGB 11.9*  HCT 36.1*  MCV 92.8  PLT 118*   BNP (last 3 results)  Recent Labs  11/14/12 0800 11/20/12 1954 10/17/13 0950  PROBNP 1488.0* 2537.0* 214.5   Radiological Exams on Admission: Dg Chest 2 View  10/17/2013   CLINICAL DATA:  Hip fracture.  Hypertension.  EXAM: CHEST  2 VIEW  COMPARISON:  DG CHEST 2 VIEW dated 03/17/2013  FINDINGS: Mediastinum and hilar structures are normal. Large right pleural effusion is present. Underlying pulmonary infiltrate cannot be excluded. Low lung volumes with basilar atelectasis. Infiltrate left lung base cannot be excluded. Heart size normal. No pneumothorax. No acute bony abnormality. Degenerative changes both shoulders. Degenerative changes thoracic spine.  IMPRESSION: 1. Large right pleural effusion. Underlying pulmonary infiltrate on the right cannot be excluded . 2. Low lung volumes with basilar atelectasis. Infiltrate left lung base also cannot be excluded.   Electronically Signed   By: Maisie Fus  Register   On: 10/17/2013 10:35   Dg Shoulder 1v Left  10/17/2013   CLINICAL DATA:  Fall, left shoulder abrasion, hip fracture  EXAM: LEFT SHOULDER - 1 VIEW  COMPARISON:  12/11/2011  FINDINGS: Osseous demineralization.  AC joint degenerative changes.  No acute fracture or dislocation identified on single AP view.  Question chronic rotator cuff tear.  Slight cortical irregularity at the proximal humerus, likely related to the deltoid tubercle or potentially a small osteochondroma.  Visualized left ribs  intact.  IMPRESSION: Osseous demineralization with AC joint degenerative changes and question chronic rotator cuff tear.  No acute abnormalities.   Electronically Signed   By: Ulyses Southward M.D.   On: 10/17/2013 09:49   Dg Hip Complete Left  10/17/2013   CLINICAL DATA:  Left hip pain post fall  EXAM: LEFT HIP - COMPLETE 2+ VIEW  COMPARISON:  Pelvic radiograph 10/29/2012  FINDINGS: Right hip prosthesis.  Interval displaced low left femoral neck fracture without definite intertrochanteric extension.  Mild varus angulation.  Pelvis appears intact.  SI joints symmetric.  No additional fractures identified.  IMPRESSION: Displaced left femoral neck fracture.  Right hip prosthesis.  Osseous demineralization.   Electronically Signed   By: Ulyses Southward M.D.   On: 10/17/2013 09:11   Dg Hip Operative Left  10/17/2013   CLINICAL DATA:  Status post ORIF for a left hip fracture  EXAM: OPERATIVE LEFT HIP  COMPARISON:  DG HIP COMPLETE*L* dated 10/17/2013  FINDINGS: The patient has undergone placement of an intramedullary rod and telescoping screw for an intertrochanteric fracture of the right hip. The fracture fragments are more nearly anatomic in alignment.  IMPRESSION: The patient has undergone ORIF for an intratrochanteric fracture of the left hip. There is no evidence of immediate postprocedure complication.   Electronically Signed   By: David  SwazilandJordan   On: 10/17/2013 16:38   Ct Head Wo Contrast  10/17/2013   CLINICAL DATA:  Fall.  EXAM: CT HEAD WITHOUT CONTRAST  CT CERVICAL SPINE WITHOUT CONTRAST  TECHNIQUE: Multidetector CT imaging of the head and cervical spine was performed following the standard protocol without intravenous contrast. Multiplanar CT image reconstructions of the cervical spine were also generated.  COMPARISON:  09/04/2012.  FINDINGS: CT HEAD FINDINGS  No skull fracture or intracranial hemorrhage.  Small vessel disease type changes without CT evidence of large acute infarct.  No intracranial mass lesion  noted on this unenhanced exam.  Vascular calcifications.  CT CERVICAL SPINE FINDINGS  No cervical spine fracture or malalignment. No abnormal prevertebral soft tissue swelling.  Cervical spondylotic changes including prominent transverse ligament hypertrophy. Spinal stenosis most prominent C2-3 follow-up by C5-6 and C6-7 level. If there is any clinical suspicion of ligamentous injury or cord injury, MR can be obtained for further delineation.  Moderate size right-sided pleural effusion incompletely assessed on present exam.  Heterogeneous thyroid gland.  IMPRESSION: CT HEAD:  No skull fracture or intracranial hemorrhage.  CT CERVICAL SPINE:  No cervical spine fracture or malalignment. No abnormal prevertebral soft tissue swelling.  Cervical spondylotic changes including prominent transverse ligament hypertrophy. Spinal stenosis most prominent C2-3 follow-up by C5-6 and C6-7 level. If there is any clinical suspicion of ligamentous injury or cord injury, MR can be obtained for further delineation.  Moderate size right-sided pleural effusion incompletely assessed on present exam.   Electronically Signed   By: Bridgett LarssonSteve  Olson M.D.   On: 10/17/2013 09:32   Ct Cervical Spine Wo Contrast  10/17/2013   CLINICAL DATA:  Fall.  EXAM: CT HEAD WITHOUT CONTRAST  CT CERVICAL SPINE WITHOUT CONTRAST  TECHNIQUE: Multidetector CT imaging of the head and cervical spine was performed following the standard protocol without intravenous contrast. Multiplanar CT image reconstructions of the cervical spine were also generated.  COMPARISON:  09/04/2012.  FINDINGS: CT HEAD FINDINGS  No skull fracture or intracranial hemorrhage.  Small vessel disease type changes without CT evidence of large acute infarct.  No intracranial mass lesion noted on this unenhanced exam.  Vascular calcifications.  CT CERVICAL SPINE FINDINGS  No cervical spine fracture or malalignment. No abnormal prevertebral soft tissue swelling.  Cervical spondylotic changes  including prominent transverse ligament hypertrophy. Spinal stenosis most prominent C2-3 follow-up by C5-6 and C6-7 level. If there is any clinical suspicion of ligamentous injury or cord injury, MR can be obtained for further delineation.  Moderate size right-sided pleural effusion incompletely assessed on present exam.  Heterogeneous thyroid gland.  IMPRESSION: CT HEAD:  No skull fracture or intracranial hemorrhage.  CT CERVICAL SPINE:  No cervical spine fracture or malalignment. No abnormal prevertebral soft tissue swelling.  Cervical spondylotic changes including prominent transverse ligament hypertrophy. Spinal stenosis most prominent C2-3 follow-up by C5-6 and C6-7 level. If there is any clinical suspicion of ligamentous injury or cord injury, MR can be obtained for further delineation.  Moderate size right-sided  pleural effusion incompletely assessed on present exam.   Electronically Signed   By: Bridgett Larsson M.D.   On: 10/17/2013 09:32   Ct Pelvis Wo Contrast  10/17/2013   CLINICAL DATA:  Left hip fracture.  EXAM: CT PELVIS WITHOUT CONTRAST  TECHNIQUE: Multidetector CT imaging of the pelvis was performed following the standard protocol without intravenous contrast.  COMPARISON:  Plain films same day as well as 02/22/2013.  FINDINGS: Patient's right total hip arthroplasty is intact and normally located. This causes moderate streak artifact over the pelvis on the axial images. There is a displaced, minimally comminuted intertrochanteric fracture of the left hip with exaggerated coxa vara deformity about the fracture site. No evidence of hip dislocation. There is mild edematous change within the left gluteal muscles versus atrophy of the right gluteal muscles. . Mild degenerative change of the left hip. There are degenerative changes of the spine with mild grade 1 anterolisthesis of L4 on L5 unchanged.  There is diverticulosis of the colon. Is minimal calcified plaque over the abdominal aorta and iliac  vessels.  IMPRESSION: Displaced slightly comminuted left intertrochanteric fracture.   Electronically Signed   By: Elberta Fortis M.D.   On: 10/17/2013 11:55   Dg Knee Complete 4 Views Left  10/17/2013   CLINICAL DATA:  Left hip fracture post fall  EXAM: LEFT KNEE - COMPLETE 4+ VIEW  COMPARISON:  None.  FINDINGS: Osseous demineralization.  Tricompartmental joint space narrowing and minimal spur formation greatest at patellofemoral joint.  Minimal knee joint effusion.  Small calcified loose bodies at suprapatellar recess.  Scattered atherosclerotic calcification.  No acute fracture, dislocation, or bone destruction.  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  Small knee joint effusion with calcified loose bodies at suprapatellar recess.   Electronically Signed   By: Ulyses Southward M.D.   On: 10/17/2013 09:08    EKG: Independently reviewed.  Assessment/Plan Active Problems:   HYPERTENSION   BENIGN PROSTATIC HYPERTROPHY   Diastolic CHF   Femoral neck fracture   Hip fracture   CKD (chronic kidney disease) stage 3, GFR 30-59 ml/min   Left intertrochanteric hip fracture - Status post repair per orthopedic surgery. Prophylaxis postop per orthopedic surgery. - Will get PT to evaluate tomorrow. - Pain control. Diastolic heart failure - Patient without decompensation at this point. He has no peripheral edema, no JVD, no crackles on exam. His BNP is within normal limits. Right-sided pleural effusion - This is new but I doubt this acute, patient without any complaints, breathing comfortable on room air, suspect a chronic component. It is unilateral and without other heart failure symptoms and it may or may not be related to that. Will start empiric antibiotic coverage for community-acquired pneumonia given CXR. Repeat a 2-D echo as well. Patient had bilateral lower extremity swelling about a month to 2 months ago, and responded well to Lasix. We'll start Lasix  tomorrow if his blood pressure tolerates. Repeat chest x-ray tomorrow, and if still persistent, consider thoracentesis. Hypertension - hold all medications preop BPH  Diet: N.p.o. Fluids: Normal saline DVT Prophylaxis: Per orthopedic surgery  Code Status: DO NOT RESUSCITATE  Family Communication: Family at the bedside  Disposition Plan: Inpatient  Time spent: 70  This note has been created with Education officer, environmental. Any transcriptional errors are unintentional.   Costin M. Elvera Lennox, MD [facility] Pager [phone]  If 7PM-7AM, please contact night-coverage [email or web address] Password [facility] 10/17/2013, 5:30 PM

## 2013-10-17 NOTE — Brief Op Note (Signed)
10/17/2013  2:56 PM  PATIENT:  Audrie GallusEarl L Duey  80 y.o. male  PRE-OPERATIVE DIAGNOSIS:  Left intertrochanteric femur, hip fracture  POST-OPERATIVE DIAGNOSIS: Left intertrochanteric femur, hip fracture  PROCEDURE:  Procedure(s): INTRAMEDULLARY (IM) NAIL INTERTROCHANTRIC (Left)  SURGEON:  Surgeon(s) and Role:    * Shelda PalMatthew D Keslie Gritz, MD - Primary  PHYSICIAN ASSISTANT: No  ASSISTANTS: Surgical team  ANESTHESIA:   general  EBL:     BLOOD ADMINISTERED:none  DRAINS: none   LOCAL MEDICATIONS USED:  NONE  SPECIMEN:  No Specimen  DISPOSITION OF SPECIMEN:  N/A  COUNTS:  YES  TOURNIQUET:  * No tourniquets in log *  DICTATION: .Other Dictation: Dictation Number [id number]  PLAN OF CARE: Admit to inpatient   PATIENT DISPOSITION:  PACU - hemodynamically stable.   Delay start of Pharmacological VTE agent (>24hrs) due to surgical blood loss or risk of bleeding: no

## 2013-10-17 NOTE — ED Notes (Signed)
Pt transport to floor at this time by med tech.  Family at bs.  NAD upon leaving dept.

## 2013-10-18 ENCOUNTER — Inpatient Hospital Stay (HOSPITAL_COMMUNITY): Payer: Medicare Other

## 2013-10-18 ENCOUNTER — Encounter (HOSPITAL_COMMUNITY): Payer: Self-pay | Admitting: Orthopedic Surgery

## 2013-10-18 DIAGNOSIS — I519 Heart disease, unspecified: Secondary | ICD-10-CM

## 2013-10-18 DIAGNOSIS — J90 Pleural effusion, not elsewhere classified: Secondary | ICD-10-CM | POA: Diagnosis present

## 2013-10-18 LAB — GLUCOSE, SEROUS FLUID: Glucose, Fluid: 158 mg/dL

## 2013-10-18 LAB — BODY FLUID CELL COUNT WITH DIFFERENTIAL
LYMPHS FL: 6 %
Monocyte-Macrophage-Serous Fluid: 79 % (ref 50–90)
Neutrophil Count, Fluid: 15 % (ref 0–25)
Total Nucleated Cell Count, Fluid: 431 cu mm (ref 0–1000)

## 2013-10-18 LAB — CBC
HCT: 31.3 % — ABNORMAL LOW (ref 39.0–52.0)
Hemoglobin: 10.3 g/dL — ABNORMAL LOW (ref 13.0–17.0)
MCH: 30.9 pg (ref 26.0–34.0)
MCHC: 32.9 g/dL (ref 30.0–36.0)
MCV: 94 fL (ref 78.0–100.0)
PLATELETS: 92 10*3/uL — AB (ref 150–400)
RBC: 3.33 MIL/uL — ABNORMAL LOW (ref 4.22–5.81)
RDW: 14.6 % (ref 11.5–15.5)
WBC: 4.8 10*3/uL (ref 4.0–10.5)

## 2013-10-18 LAB — PROTEIN, BODY FLUID: Total protein, fluid: 3.6 g/dL

## 2013-10-18 LAB — LACTATE DEHYDROGENASE, PLEURAL OR PERITONEAL FLUID: LD FL: 249 U/L — AB (ref 3–23)

## 2013-10-18 LAB — COMPREHENSIVE METABOLIC PANEL
ALK PHOS: 91 U/L (ref 39–117)
ALT: 12 U/L (ref 0–53)
AST: 21 U/L (ref 0–37)
Albumin: 2.7 g/dL — ABNORMAL LOW (ref 3.5–5.2)
BILIRUBIN TOTAL: 0.6 mg/dL (ref 0.3–1.2)
BUN: 31 mg/dL — ABNORMAL HIGH (ref 6–23)
CHLORIDE: 108 meq/L (ref 96–112)
CO2: 21 mEq/L (ref 19–32)
Calcium: 8.4 mg/dL (ref 8.4–10.5)
Creatinine, Ser: 1.29 mg/dL (ref 0.50–1.35)
GFR calc Af Amer: 56 mL/min — ABNORMAL LOW (ref >90)
GFR calc non Af Amer: 48 mL/min — ABNORMAL LOW (ref >90)
Glucose, Bld: 150 mg/dL — ABNORMAL HIGH (ref 70–99)
POTASSIUM: 5.6 meq/L — AB (ref 3.7–5.3)
Sodium: 140 mEq/L (ref 137–147)
Total Protein: 5 g/dL — ABNORMAL LOW (ref 6.0–8.3)

## 2013-10-18 LAB — PHOSPHORUS: Phosphorus: 4.6 mg/dL (ref 2.3–4.6)

## 2013-10-18 LAB — MAGNESIUM: Magnesium: 2 mg/dL (ref 1.5–2.5)

## 2013-10-18 MED ORDER — SODIUM POLYSTYRENE SULFONATE 15 GM/60ML PO SUSP
15.0000 g | Freq: Once | ORAL | Status: AC
  Administered 2013-10-18: 15 g via ORAL
  Filled 2013-10-18: qty 60

## 2013-10-18 MED ORDER — SODIUM CHLORIDE 0.9 % IV SOLN
INTRAVENOUS | Status: DC
  Administered 2013-10-18: 06:00:00 via INTRAVENOUS

## 2013-10-18 NOTE — Procedures (Signed)
US guided diagnostic/therapeutic right thoracentesis performed yielding 1.2 liters yellow fluid. The fluid was sent to the lab for preordered studies. F/u CXR pending. No immediate complications. 

## 2013-10-18 NOTE — Op Note (Signed)
NA[place]:  [name], [name]              ACCOUNT NO.:  [id number]  MEDICAL RECORD NO.:  [id number]  LOCATION:  1427                         [facility]  PHYSICIAN:  Madlyn FrankelMatthew D. Charlann Boxerlin, M.D.  DATE OF BIRTH:  June 01, 1925  DATE OF PROCEDURE:  10/17/2013 DATE OF DISCHARGE:                              OPERATIVE REPORT   PREOPERATIVE DIAGNOSIS:  Left intertrochanteric femur fracture.  POSTOPERATIVE DIAGNOSES: 1. Left intertrochanteric femur fracture. 2. Severe flexion contractures about the hip and knee ipsilaterally.  PROCEDURE:  Left hip open reduction and internal fixation utilizing a [employer] intramedullary nail with a 90-mm proximal lag screw and a distal interlock.  SURGEON:  Madlyn FrankelMatthew D. Charlann Boxerlin, M.D.  ASSISTANT:  Surgical team.  ANESTHESIA:  General.  SPECIMENS:  None.  COMPLICATION:  None.  BLOOD LOSS:  Less than 100 mL.  INDICATIONS FOR THE PROCEDURE:  [name] is an 80 year old male with a surgical history known to me from previous treatment with right total hip replacement due to failed right hip fracture fixation.  He presented to the emergency room today after a fall at home injuring his left hip.  Radiographs revealed a fracture of the proximal femur.  The exact classification of the fracture was further clarified with the use of the CT scan based on the flex nature of his hip.  This was defined as an intertrochanteric femur fracture.  He was subsequently set up for open reduction and internal fixation after reviewing the risks, benefits, as well as specifically the risks they dealt with before. Consent was obtained for benefit of pain relief.  PROCEDURE IN DETAIL:  The patient was brought to operative theater. Once adequate anesthesia, preoperative antibiotics, Ancef administered in addition to preoperative use of azithromycin and Rocephin, he was positioned on the fracture table supine.  His right hip which had previously operated on was carefully flexed  and abducted all the way with bony prominences padded.  He was noted to have significant adduction and contractures as well as internal rotation contracture of this lower extremity, we were careful in positioning.  Padded perineal post was placed.  Applied traction against.  His left foot was placed in traction boot.  Significant flexion contracture noted about the knee as well as the hip.  At this point, under fluoroscopic imaging, we were able to apply traction, better visualized the fracture fragment.  At this point, the left lower extremity was prepped and draped in sterile fashion with shower curtain technique.  Time-out was performed identifying the patient, planned procedure, and extremity.  Fluoroscopy was brought back to the field and incision was made in the proximal to the greater trochanter.  Soft tissue dissection carried down through the gluteal fascia.  The guidewire was then inserted based on the preoperative planning and the anterior part of the trochanter and the anterior central portion the trochanters pose a posterior based on the displacement of his fracture as well as the angles identified fluoroscopically.  The guidewire was inserted into the proximal femur.  The proximal femur was then opened with a starting drill and an 11 x 180 mm intramedullary was passed by hand to its appropriate depth.  I used 125 degree lag  screw selection at this point, based on his neck shaft angle.  Once in its appropriate position, I placed a guidewire into the center of the femoral head in AP and lateral planes.  I measured the depth, selected a 90 mm screw, I then drilled for the screw under fluoroscopic imaging.  The final screw was then passed by hand.  I then used the compression wheel and medialized the shaft of the fracture.  The proximal femur was then locked and backed off the quarter, turned to allow for some further compression through the fracture site.  The distal  interlock was placed distally.  At this point, the jig was removed and final radiographs were obtained in AP and lateral planes.  The wounds were irrigated with normal saline solution.  The proximal wound was closed in layers with #1 Vicryl in a gluteal fascia.  The remainder of the wounds were all closed with 2-0 Vicryl and then regular staples.  The skin was then cleaned, dried, and dressed sterilely using a Mepilex dressing.  He was then extubated and brought to the recovery room in stable condition and tolerating the procedure well.  Findings reviewed with family and allowed him to be partial weightbearing at this point, to allow for fracture healing based on the one he is having on his right hp.     Madlyn Frankel Charlann Boxer, M.D.     MDO/MEDQ  D:  10/17/2013  T:  10/18/2013  Job:  [id number]

## 2013-10-18 NOTE — Evaluation (Signed)
Physical Therapy Evaluation Patient Details Name: [name] L Dorrance MRN: [id number] DOB: 1925-01-27 Today's Date: 10/18/2013 Time: [id number] PT Time Calculation (min): 23 min  PT Assessment / Plan / Recommendation History of Present Illness  80 yo male s/p L IM nail intertrochanteric femur 2/26, L knee joint effusion, questionable L shulder rotator tear. Hx of R THA, arthritis  Clinical Impression  On eval, pt required Mod assist (+2 for safety when ambulating) for mobility-able to ambulate ~25 feet with walker. Discussed d/c plan-pt unsure at time of eval. Considering SNF. Recommend SNF vs HHPT, 24/7 depending on progress and family's ability to provide assistance.     PT Assessment  Patient needs continued PT services    Follow Up Recommendations  SNF;Home health PT;Supervision/Assistance - 24 hour (depending on progress)    Does the patient have the potential to tolerate intense rehabilitation      Barriers to Discharge        Equipment Recommendations  None recommended by PT    Recommendations for Other Services OT consult   Frequency Min 4X/week    Precautions / Restrictions Precautions Precautions: Fall Restrictions Weight Bearing Restrictions: Yes LLE Weight Bearing: Partial weight bearing LLE Partial Weight Bearing Percentage or Pounds: 50%   Pertinent Vitals/Pain 6/10 L LE with activity; 0/10 at rest      Mobility  Bed Mobility Overal bed mobility: Needs Assistance Bed Mobility: Supine to Sit Supine to sit: HOB elevated;Mod assist General bed mobility comments: Assist for L LE and trunk to upright. Increased time.  Transfers Overall transfer level: Needs assistance Transfers: Sit to/from Stand Sit to Stand: Max assist;From elevated surface General transfer comment: Assist to rise, stabilize, control descent. VCs safety, technique, hand/L LE placement Ambulation/Gait Ambulation/Gait assistance: Mod assist;+2 safety/equipment Ambulation Distance (Feet): 25  Feet Assistive device: Rolling walker (2 wheeled) Gait Pattern/deviations: Step-to pattern;Antalgic;Decreased step length - left;Decreased stance time - left General Gait Details: slow gait speed. VCs safety, sequence, technique, adherence to PWB status. Pt only able to tolerate WBing throught forefoot/toes at this time. Encouraged pt to work towards getting foot flat as ambulation progresses.     Exercises     PT Diagnosis: Difficulty walking;Abnormality of gait;Generalized weakness;Acute pain  PT Problem List: Decreased strength;Decreased range of motion;Decreased activity tolerance;Decreased balance;Decreased mobility;Pain;Decreased knowledge of use of DME;Decreased safety awareness;Decreased knowledge of precautions PT Treatment Interventions: DME instruction;Gait training;Functional mobility training;Therapeutic activities;Therapeutic exercise;Patient/family education;Balance training     PT Goals(Current goals can be found in the care plan section) Acute Rehab PT Goals Patient Stated Goal: regain independence PT Goal Formulation: With patient/family Time For Goal Achievement: 11/01/13 Potential to Achieve Goals: Good  Visit Information  Last PT Received On: 10/18/13 Assistance Needed: +2 (safety) History of Present Illness: 80 yo male s/p L IM nail intertrochanteric femur 2/26, L knee joint effusion, questionable L shulder rotator tear. Hx of R THA, arthritis       Prior Functioning  Home Living Family/patient expects to be discharged to:: Unsure Living Arrangements: Spouse/significant other Prior Function Level of Independence: Independent with assistive device(s) Communication Communication: No difficulties    Cognition  Cognition Arousal/Alertness: Awake/alert Behavior During Therapy: WFL for tasks assessed/performed Overall Cognitive Status: Within Functional Limits for tasks assessed    Extremity/Trunk Assessment Upper Extremity Assessment Upper Extremity  Assessment: Generalized weakness Lower Extremity Assessment Lower Extremity Assessment: LLE deficits/detail;RLE deficits/detail RLE Deficits / Details: Strength at least 3+/5 throughout LLE Deficits / Details: moves ankle. Unable to fully straighten L knee at time of  eval- knee et ~20 degrees from 0. hip flex 2/5, hip abd/add 2/5.  LLE: Unable to fully assess due to pain Cervical / Trunk Assessment Cervical / Trunk Assessment: Kyphotic   Balance    End of Session PT - End of Session Equipment Utilized During Treatment: Gait belt Activity Tolerance: Patient limited by fatigue;Patient limited by pain Patient left: in chair;with call bell/phone within reach;with family/visitor present Nurse Communication: Precautions;Weight bearing status;Mobility status (lift pad under pt just in case but he should pivot fine with +2 assist)  GP     Rebeca Alert, MPT Pager: [phone]

## 2013-10-18 NOTE — Progress Notes (Signed)
Clinical Social Work Department BRIEF PSYCHOSOCIAL ASSESSMENT 10/18/2013  Patient:  [name],[name]     Account Number:  [id number]     Admit date:  10/17/2013  Clinical Social Worker:  Orpah GreekFOLEY,Virat Prather, LCSWA  Date/Time:  10/18/2013 02:25 PM  Referred by:  Physician  Date Referred:  10/18/2013 Referred for  SNF Placement   Other Referral:   Interview type:  Patient Other interview type:   and wife & daughter at bedside    PSYCHOSOCIAL DATA Living Status:  WIFE Admitted from facility:   Level of care:   Primary support name:  [name] (wife) h#: [phone] Primary support relationship to patient:  SPOUSE Degree of support available:   good    CURRENT CONCERNS Current Concerns  Post-Acute Placement   Other Concerns:    SOCIAL WORK ASSESSMENT / PLAN CSW received consult that PT recommended SNF for patient at discharge.   Assessment/plan status:  [facility] Other assessment/ plan:   Information/referral to community resources:   CSW completed FL2 and [facility] - provided bed offers to patient who [facility]    PATIENT'S/FAMILY'S RESPONSE TO PLAN OF CARE: Patient & family are agreeable with plan for SNF - [facility] will have a bed available Monday, pending medical clearance.    Patient [facility] other [facility] and states that he had a good experience then.       Jordan MaxinKelly Alaynna Kerwood, LCSW [facility] Clinical Social Worker cell #: [phone]

## 2013-10-18 NOTE — Progress Notes (Signed)
OT Cancellation Note  Patient Details Name: [name] MRN: [id number] DOB: 09-28-1924   Cancelled Treatment:    Reason Eval/Treat Not Completed: Other (comment) Pt plans snf for rehab per SW.  Will defer OT eval to that venue as it is likely not needed for admission.  Mohamed Portlock 10/18/2013, 2:24 PM Marica OtterMaryellen Peyten Weare, OTR/L [phone] 10/18/2013

## 2013-10-18 NOTE — Progress Notes (Signed)
PROGRESS NOTE  [name]:[id number] DOB: [birth year] DOA: 10/17/2013 PCP: Rogelia Boga, MD  Assessment/Plan: Left intertrochanteric hip fracture  - Status post repair per orthopedic surgery. Prophylaxis postop per orthopedic surgery.  - Will get PT to evaluate.  - Pain control.  Diastolic heart failure  - Patient without decompensation at this point. He has no peripheral edema, no JVD, no crackles on exam. His BNP is within normal limits. - repeat 2D echo pending  Right-sided pleural effusion  - This is new but I doubt this acute, patient without any complaints, breathing comfortable on room air, suspect a chronic component. It is unilateral and without other heart failure symptoms and it may or may not be related to that. Will start empiric antibiotic coverage for community-acquired pneumonia given CXR. - US guided thoracentesis for fluid analysis, stain and cultures.  Hypertension - stable BPH - foley  Diet: full, advance as tolerated Fluids: KVO DVT Prophylaxis: holding Lovenox with planned thora  Code Status: DNR Family Communication: none this morning  Disposition Plan: inpatient  Consultants:  Orthopedic surgery   Procedures:  none   Antibiotics  Anti-infectives   Start     Dose/Rate Route Frequency Ordered Stop   10/17/13 1200  azithromycin (ZITHROMAX) 250 mg in dextrose 5 % 125 mL IVPB     250 mg 125 mL/hr over 60 Minutes Intravenous Every 24 hours 10/17/13 1139     10/17/13 1145  cefTRIAXone (ROCEPHIN) 1 g in dextrose 5 % 50 mL IVPB     1 g 100 mL/hr over 30 Minutes Intravenous Every 24 hours 10/17/13 1139       Antibiotics Given (last 72 hours)   Date/Time Action Medication Dose   10/17/13 1502 Given   azithromycin (ZITHROMAX) 250 mg in dextrose 5 % 125 mL IVPB 250 mg      HPI/Subjective: - feeling well this morning, denies pain or breathing difficulties.   Objective: Filed Vitals:   10/17/13 2345 10/17/13 2357 10/18/13 0341  10/18/13 0610  BP:  98/64  126/55  Pulse:  83  82  Temp:  97.6 F (36.4 C)  98.5 F (36.9 C)  TempSrc:  Axillary  Axillary  Resp: 17 17 18 18   Height:      Weight:      SpO2: 96% 95% 100% 99%    Intake/Output Summary (Last 24 hours) at 10/18/13 0759 Last data filed at 10/18/13 0700  Gross per 24 hour  Intake 258.34 ml  Output    825 ml  Net -566.66 ml   Filed Weights   10/17/13 2005  Weight: 61.1 kg (134 lb 11.2 oz)    Exam:  General:  NAD  Cardiovascular: regular rate and rhythm, without MRG  Respiratory: good air movement, decreased breath sounds on right, no wheezing, ronchi or rales  Abdomen: soft, not tender to palpation, positive bowel sounds  MSK: no peripheral edema Neuro: grossly non focal Data Reviewed: Basic Metabolic Panel:  Recent Labs Lab 10/17/13 0950 10/18/13 0356  NA 141 140  K 4.1 5.6*  CL 109 108  CO2 20 21  GLUCOSE 99 150*  BUN 33* 31*  CREATININE 1.37* 1.29  CALCIUM 9.1 8.4  MG  --  2.0  PHOS  --  4.6   Liver Function Tests:  Recent Labs Lab 10/18/13 0356  AST 21  ALT 12  ALKPHOS 91  BILITOT 0.6  PROT 5.0*  ALBUMIN 2.7*   CBC:  Recent Labs Lab 10/17/13 0950 10/18/13 0356  WBC 6.5 4.8  NEUTROABS 5.3  --   HGB 11.9* 10.3*  HCT 36.1* 31.3*  MCV 92.8 94.0  PLT 118* 92*   BNP (last 3 results)  Recent Labs  11/14/12 0800 11/20/12 1954 10/17/13 0950  PROBNP 1488.0* 2537.0* 214.5   Studies: Dg Chest 2 View  10/17/2013   CLINICAL DATA:  Hip fracture.  Hypertension.  EXAM: CHEST  2 VIEW  COMPARISON:  DG CHEST 2 VIEW dated 03/17/2013  FINDINGS: Mediastinum and hilar structures are normal. Large right pleural effusion is present. Underlying pulmonary infiltrate cannot be excluded. Low lung volumes with basilar atelectasis. Infiltrate left lung base cannot be excluded. Heart size normal. No pneumothorax. No acute bony abnormality. Degenerative changes both shoulders. Degenerative changes thoracic spine.  IMPRESSION: 1.  Large right pleural effusion. Underlying pulmonary infiltrate on the right cannot be excluded . 2. Low lung volumes with basilar atelectasis. Infiltrate left lung base also cannot be excluded.   Electronically Signed   By: Maisie Fus  Register   On: 10/17/2013 10:35   Dg Shoulder 1v Left  10/17/2013   CLINICAL DATA:  Fall, left shoulder abrasion, hip fracture  EXAM: LEFT SHOULDER - 1 VIEW  COMPARISON:  12/11/2011  FINDINGS: Osseous demineralization.  AC joint degenerative changes.  No acute fracture or dislocation identified on single AP view.  Question chronic rotator cuff tear.  Slight cortical irregularity at the proximal humerus, likely related to the deltoid tubercle or potentially a small osteochondroma.  Visualized left ribs intact.  IMPRESSION: Osseous demineralization with AC joint degenerative changes and question chronic rotator cuff tear.  No acute abnormalities.   Electronically Signed   By: Ulyses Southward M.D.   On: 10/17/2013 09:49   Dg Hip Complete Left  10/17/2013   CLINICAL DATA:  Left hip pain post fall  EXAM: LEFT HIP - COMPLETE 2+ VIEW  COMPARISON:  Pelvic radiograph 10/29/2012  FINDINGS: Right hip prosthesis.  Interval displaced low left femoral neck fracture without definite intertrochanteric extension.  Mild varus angulation.  Pelvis appears intact.  SI joints symmetric.  No additional fractures identified.  IMPRESSION: Displaced left femoral neck fracture.  Right hip prosthesis.  Osseous demineralization.   Electronically Signed   By: Ulyses Southward M.D.   On: 10/17/2013 09:11   Dg Hip Operative Left  10/17/2013   CLINICAL DATA:  Status post ORIF for a left hip fracture  EXAM: OPERATIVE LEFT HIP  COMPARISON:  DG HIP COMPLETE*L* dated 10/17/2013  FINDINGS: The patient has undergone placement of an intramedullary rod and telescoping screw for an intertrochanteric fracture of the right hip. The fracture fragments are more nearly anatomic in alignment.  IMPRESSION: The patient has undergone ORIF  for an intratrochanteric fracture of the left hip. There is no evidence of immediate postprocedure complication.   Electronically Signed   By: David  [place]   On: 10/17/2013 16:38   Ct Head Wo Contrast  10/17/2013   CLINICAL DATA:  Fall.  EXAM: CT HEAD WITHOUT CONTRAST  CT CERVICAL SPINE WITHOUT CONTRAST  TECHNIQUE: Multidetector CT imaging of the head and cervical spine was performed following the standard protocol without intravenous contrast. Multiplanar CT image reconstructions of the cervical spine were also generated.  COMPARISON:  09/04/2012.  FINDINGS: CT HEAD FINDINGS  No skull fracture or intracranial hemorrhage.  Small vessel disease type changes without CT evidence of large acute infarct.  No intracranial mass lesion noted on this unenhanced exam.  Vascular calcifications.  CT CERVICAL SPINE FINDINGS  No cervical spine  fracture or malalignment. No abnormal prevertebral soft tissue swelling.  Cervical spondylotic changes including prominent transverse ligament hypertrophy. Spinal stenosis most prominent C2-3 follow-up by C5-6 and C6-7 level. If there is any clinical suspicion of ligamentous injury or cord injury, MR can be obtained for further delineation.  Moderate size right-sided pleural effusion incompletely assessed on present exam.  Heterogeneous thyroid gland.  IMPRESSION: CT HEAD:  No skull fracture or intracranial hemorrhage.  CT CERVICAL SPINE:  No cervical spine fracture or malalignment. No abnormal prevertebral soft tissue swelling.  Cervical spondylotic changes including prominent transverse ligament hypertrophy. Spinal stenosis most prominent C2-3 follow-up by C5-6 and C6-7 level. If there is any clinical suspicion of ligamentous injury or cord injury, MR can be obtained for further delineation.  Moderate size right-sided pleural effusion incompletely assessed on present exam.   Electronically Signed   By: Bridgett LarssonSteve  Olson M.D.   On: 10/17/2013 09:32   Ct Cervical Spine Wo  Contrast  10/17/2013   CLINICAL DATA:  Fall.  EXAM: CT HEAD WITHOUT CONTRAST  CT CERVICAL SPINE WITHOUT CONTRAST  TECHNIQUE: Multidetector CT imaging of the head and cervical spine was performed following the standard protocol without intravenous contrast. Multiplanar CT image reconstructions of the cervical spine were also generated.  COMPARISON:  09/04/2012.  FINDINGS: CT HEAD FINDINGS  No skull fracture or intracranial hemorrhage.  Small vessel disease type changes without CT evidence of large acute infarct.  No intracranial mass lesion noted on this unenhanced exam.  Vascular calcifications.  CT CERVICAL SPINE FINDINGS  No cervical spine fracture or malalignment. No abnormal prevertebral soft tissue swelling.  Cervical spondylotic changes including prominent transverse ligament hypertrophy. Spinal stenosis most prominent C2-3 follow-up by C5-6 and C6-7 level. If there is any clinical suspicion of ligamentous injury or cord injury, MR can be obtained for further delineation.  Moderate size right-sided pleural effusion incompletely assessed on present exam.  Heterogeneous thyroid gland.  IMPRESSION: CT HEAD:  No skull fracture or intracranial hemorrhage.  CT CERVICAL SPINE:  No cervical spine fracture or malalignment. No abnormal prevertebral soft tissue swelling.  Cervical spondylotic changes including prominent transverse ligament hypertrophy. Spinal stenosis most prominent C2-3 follow-up by C5-6 and C6-7 level. If there is any clinical suspicion of ligamentous injury or cord injury, MR can be obtained for further delineation.  Moderate size right-sided pleural effusion incompletely assessed on present exam.   Electronically Signed   By: Bridgett LarssonSteve  Olson M.D.   On: 10/17/2013 09:32   Ct Pelvis Wo Contrast  10/17/2013   CLINICAL DATA:  Left hip fracture.  EXAM: CT PELVIS WITHOUT CONTRAST  TECHNIQUE: Multidetector CT imaging of the pelvis was performed following the standard protocol without intravenous contrast.   COMPARISON:  Plain films same day as well as 02/22/2013.  FINDINGS: Patient's right total hip arthroplasty is intact and normally located. This causes moderate streak artifact over the pelvis on the axial images. There is a displaced, minimally comminuted intertrochanteric fracture of the left hip with exaggerated coxa vara deformity about the fracture site. No evidence of hip dislocation. There is mild edematous change within the left gluteal muscles versus atrophy of the right gluteal muscles. . Mild degenerative change of the left hip. There are degenerative changes of the spine with mild grade 1 anterolisthesis of L4 on L5 unchanged.  There is diverticulosis of the colon. Is minimal calcified plaque over the abdominal aorta and iliac vessels.  IMPRESSION: Displaced slightly comminuted left intertrochanteric fracture.   Electronically Signed   By:  Elberta Fortis M.D.   On: 10/17/2013 11:55   Dg Knee Complete 4 Views Left  10/17/2013   CLINICAL DATA:  Left hip fracture post fall  EXAM: LEFT KNEE - COMPLETE 4+ VIEW  COMPARISON:  None.  FINDINGS: Osseous demineralization.  Tricompartmental joint space narrowing and minimal spur formation greatest at patellofemoral joint.  Minimal knee joint effusion.  Small calcified loose bodies at suprapatellar recess.  Scattered atherosclerotic calcification.  No acute fracture, dislocation, or bone destruction.  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  Small knee joint effusion with calcified loose bodies at suprapatellar recess.   Electronically Signed   By: Ulyses Southward M.D.   On: 10/17/2013 09:08    Scheduled Meds: . allopurinol  100 mg Oral Daily  . azithromycin  250 mg Intravenous Q24H  . cefTRIAXone (ROCEPHIN)  IV  1 g Intravenous Q24H  . docusate sodium  100 mg Oral BID  . ferrous sulfate  325 mg Oral BID WC  . tamsulosin  0.4 mg Oral BID   Continuous Infusions: . sodium chloride 50 mL/hr at 10/18/13 0531     Active Problems:   HYPERTENSION   BENIGN PROSTATIC HYPERTROPHY   Diastolic CHF   Femoral neck fracture   Hip fracture   CKD (chronic kidney disease) stage 3, GFR 30-59 ml/min   Pleural effusion  Time spent: 35  This note has been created with Education officer, environmental. Any transcriptional errors are unintentional.   Pamella Pert, MD [facility] Pager [phone]. If 7 PM - 7 AM, please contact night-coverage at [email or web address], password [facility] 10/18/2013, 7:59 AM  LOS: 1 day

## 2013-10-18 NOTE — Progress Notes (Signed)
Patient will have a bed [facility] on Monday.  Clinical Social Work Department CLINICAL SOCIAL WORK PLACEMENT NOTE 10/18/2013  Patient:  [name],[name]  Account Number:  [id number] Admit date:  10/17/2013  Clinical Social Worker:  Orpah GreekKELLY FOLEY, LCSWA  Date/time:  10/18/2013 02:30 PM  Clinical Social Work is seeking post-discharge placement for this patient at the following level of care:   SKILLED NURSING   (*CSW will update this form in Epic as items are completed)   10/18/2013  Patient/family provided with [facility] Department of Clinical Social Work's list of facilities offering this level of care within the geographic area requested by the patient (or if unable, by the patient's family).  10/18/2013  Patient/family informed of their freedom to choose among providers that offer the needed level of care, that participate in Medicare, Medicaid or managed care program needed by the patient, have an available bed and are willing to accept the patient.  10/18/2013  Patient/family informed of [facility]' ownership interest in [facility], as well as of the fact that they are under no obligation to receive care at this facility.  PASARR submitted to EDS on 10/18/2013 PASARR number received from EDS on 10/18/2013  FL2 transmitted to all facilities in geographic area requested by pt/family on  10/18/2013 FL2 transmitted to all facilities within larger geographic area on   Patient informed that his/her managed care company has contracts with or will negotiate with  certain facilities, including the following:     Patient/family informed of bed offers received:  10/18/2013 Patient chooses bed [facility] Physician recommends and patient chooses bed at    [facility]   Patient to be transferred to facility by   The following physician request were entered in Epic:   Additional Comments:   Lincoln MaxinKelly Aitana Burry, LCSW [facility] Clinical Social Worker cell #: [phone]

## 2013-10-18 NOTE — Progress Notes (Signed)
Patient ID: Audrie Gallusarl L Geraci, male   DOB: [birth year], 80 y.o.   MRN: [id number] Subjective: 1 Day Post-Op Procedure(s) (LRB): INTRAMEDULLARY (IM) NAIL INTERTROCHANTRIC (Left)    Patient reports pain as mild.  Doing fine in initial post-op period.  No events, family not at bedside this am.  Hospitalist at bedside discussing pleural effusion findings  Objective:   VITALS:   Filed Vitals:   10/18/13 0610  BP: 126/55  Pulse: 82  Temp: 98.5 F (36.9 C)  Resp: 18    Neurovascular intact Incision: dressing C/D/I Very stiff bilateral lower extremities related to diminished activity and OA   LABS  Recent Labs  10/17/13 0950 10/18/13 0356  HGB 11.9* 10.3*  HCT 36.1* 31.3*  WBC 6.5 4.8  PLT 118* 92*     Recent Labs  10/17/13 0950 10/18/13 0356  NA 141 140  K 4.1 5.6*  BUN 33* 31*  CREATININE 1.37* 1.29  GLUCOSE 99 150*     Recent Labs  10/17/13 0950  INR 1.14     Assessment/Plan: 1 Day Post-Op Procedure(s) (LRB): INTRAMEDULLARY (IM) NAIL INTERTROCHANTRIC (Left)   Advance diet Up with therapy Discharge to SNF when medically stable Discussed Rx on chart

## 2013-10-18 NOTE — Progress Notes (Signed)
Attempted 4316 F regular foley. Not able to pass catheter. MD notified and called urology. Urology called RN Jetta Lout(Diane Warden) to give verbal order to place 7816 F coude. Coude was placed as directed. Urine returned. Patient feels much better at this time. Diane Myrtie SomanWarden was notified. Will continue to monitor patient. Setzer, Don BroachAllison Marie

## 2013-10-18 NOTE — Progress Notes (Signed)
Echo Lab  2D Echocardiogram completed.  Mihika Surrette L Shyonna Carlin, RDCS 10/18/2013 10:12 AM

## 2013-10-19 LAB — CBC
HCT: 31.3 % — ABNORMAL LOW (ref 39.0–52.0)
Hemoglobin: 10.3 g/dL — ABNORMAL LOW (ref 13.0–17.0)
MCH: 30.7 pg (ref 26.0–34.0)
MCHC: 32.9 g/dL (ref 30.0–36.0)
MCV: 93.2 fL (ref 78.0–100.0)
PLATELETS: 106 10*3/uL — AB (ref 150–400)
RBC: 3.36 MIL/uL — ABNORMAL LOW (ref 4.22–5.81)
RDW: 14.5 % (ref 11.5–15.5)
WBC: 9 10*3/uL (ref 4.0–10.5)

## 2013-10-19 LAB — BASIC METABOLIC PANEL
BUN: 32 mg/dL — ABNORMAL HIGH (ref 6–23)
CHLORIDE: 104 meq/L (ref 96–112)
CO2: 20 mEq/L (ref 19–32)
CREATININE: 1.25 mg/dL (ref 0.50–1.35)
Calcium: 8.3 mg/dL — ABNORMAL LOW (ref 8.4–10.5)
GFR calc non Af Amer: 50 mL/min — ABNORMAL LOW (ref >90)
GFR, EST AFRICAN AMERICAN: 58 mL/min — AB (ref >90)
Glucose, Bld: 98 mg/dL (ref 70–99)
Potassium: 4.6 mEq/L (ref 3.7–5.3)
Sodium: 137 mEq/L (ref 137–147)

## 2013-10-19 LAB — PH, BODY FLUID: pH, Fluid: 7.5

## 2013-10-19 LAB — LACTATE DEHYDROGENASE: LDH: 209 U/L (ref 94–250)

## 2013-10-19 MED ORDER — AZITHROMYCIN 500 MG PO TABS
500.0000 mg | ORAL_TABLET | Freq: Every day | ORAL | Status: DC
  Administered 2013-10-20: 500 mg via ORAL
  Filled 2013-10-19 (×2): qty 1

## 2013-10-19 MED ORDER — ENOXAPARIN SODIUM 30 MG/0.3ML ~~LOC~~ SOLN
30.0000 mg | Freq: Every day | SUBCUTANEOUS | Status: DC
  Administered 2013-10-19 – 2013-10-21 (×3): 30 mg via SUBCUTANEOUS
  Filled 2013-10-19 (×3): qty 0.3

## 2013-10-19 NOTE — Progress Notes (Signed)
PHARMACIST - PHYSICIAN COMMUNICATION CONCERNING: Antibiotic IV to Oral Route Change Policy  RECOMMENDATION: This patient is receiving Azithromycin by the intravenous route.  Based on criteria approved by the Pharmacy and Therapeutics Committee, the antibiotic(s) is/are being converted to the equivalent oral dose form(s).   DESCRIPTION: These criteria include:  Patient being treated for a respiratory tract infection, urinary tract infection, or cellulitis  The patient is not neutropenic and does not exhibit a GI malabsorption state  The patient is eating (either orally or via tube) and/or has been taking other orally administered medications for a least 24 hours  The patient is improving clinically and has a Tmax < 100.5  If you have questions about this conversion, please contact the Pharmacy Department  []   [phone]( [phone] )  [facility] []   [phone]  [facility]  []   [phone]( [phone]  [facility] [x]   [phone]( [phone] )  [facility]   Geoffry Paradisehuyvan Anthonette Lesage, PharmD, BCPS Pager: [phone] 2:51 PM Pharmacy #: [id number]

## 2013-10-19 NOTE — Progress Notes (Signed)
PROGRESS NOTE  Jordan TURNAGE ZOX:[id number] DOB: Jan 14, 1925 DOA: 10/17/2013 PCP: Rogelia Boga, MD  Assessment/Plan:  Left intertrochanteric hip fracture  - Status post repair per orthopedic surgery. Prophylaxis postop per orthopedic surgery.  - Will get PT to evaluate.  - Pain control.  Diastolic heart failure  - Patient without decompensation at this point. He has no peripheral edema, no JVD, no crackles on exam. His BNP is within normal limits. - repeat 2D echo with normal ejection fraction of 55-60% and grade 1 diastolic dysfunction. Right-sided pleural effusion  - This is new but I doubt this acute, patient without any complaints, breathing comfortable on room air, suspect a chronic component. Continue empiric antibiotic coverage for community-acquired pneumonia given CXR. - US guided thoracentesis for fluid analysis, stain and cultures. Fluid analysis points toward this being an exudate by light criteria. ?Parapneumonic versus malignant versus PE. Cytology pending, patient without tachycardia, dyspnea, PE unlikely. We'll repeat a chest x-ray on Monday, and patient probably will need to repeat chest x-ray in one to 2 weeks following discharge to evaluate for recurrence. Hypertension - stable BPH - foley  Diet: advance as tolerated Fluids: KVO DVT Prophylaxis: Lovenox  Code Status: DNR Family Communication: Daughter and wife  Disposition Plan: inpatient  Consultants:  Orthopedic surgery   Procedures:  none   Antibiotics  Anti-infectives   Start     Dose/Rate Route Frequency Ordered Stop   10/17/13 1200  azithromycin (ZITHROMAX) 250 mg in dextrose 5 % 125 mL IVPB     250 mg 125 mL/hr over 60 Minutes Intravenous Every 24 hours 10/17/13 1139     10/17/13 1145  cefTRIAXone (ROCEPHIN) 1 g in dextrose 5 % 50 mL IVPB     1 g 100 mL/hr over 30 Minutes Intravenous Every 24 hours 10/17/13 1139       Antibiotics Given (last 72 hours)   Date/Time Action  Medication Dose Rate   10/17/13 1502 Given   azithromycin (ZITHROMAX) 250 mg in dextrose 5 % 125 mL IVPB 250 mg    10/18/13 1128 Given   azithromycin (ZITHROMAX) 250 mg in dextrose 5 % 125 mL IVPB 250 mg 125 mL/hr   10/19/13 1241 Given   azithromycin (ZITHROMAX) 250 mg in dextrose 5 % 125 mL IVPB 250 mg 125 mL/hr      HPI/Subjective: - feeling a bit sleepy this morning, after 2 pain pills.  Objective: Filed Vitals:   10/19/13 0000 10/19/13 0400 10/19/13 0443 10/19/13 1422  BP:   135/67 132/60  Pulse:   93 82  Temp:   97.7 F (36.5 C) 97.2 F (36.2 C)  TempSrc:   Oral Oral  Resp: 17 18 18 18   Height:      Weight:   59.1 kg (130 lb 4.7 oz)   SpO2: 97% 98% 96% 97%    Intake/Output Summary (Last 24 hours) at 10/19/13 1427 Last data filed at 10/19/13 1423  Gross per 24 hour  Intake    720 ml  Output   1225 ml  Net   -505 ml   Filed Weights   10/17/13 2005 10/19/13 0443  Weight: 61.1 kg (134 lb 11.2 oz) 59.1 kg (130 lb 4.7 oz)   Exam:  General:  NAD  Cardiovascular: regular rate and rhythm, without MRG  Respiratory: good air movement,, no wheezing, ronchi or rales  Abdomen: soft, not tender to palpation, positive bowel sounds  MSK: no peripheral edema Neuro: grossly non focal Data Reviewed: Basic Metabolic Panel:  Recent Labs Lab 10/17/13 0950 10/18/13 0356 10/19/13 0455  NA 141 140 137  K 4.1 5.6* 4.6  CL 109 108 104  CO2 20 21 20   GLUCOSE 99 150* 98  BUN 33* 31* 32*  CREATININE 1.37* 1.29 1.25  CALCIUM 9.1 8.4 8.3*  MG  --  2.0  --   PHOS  --  4.6  --    Liver Function Tests:  Recent Labs Lab 10/18/13 0356  AST 21  ALT 12  ALKPHOS 91  BILITOT 0.6  PROT 5.0*  ALBUMIN 2.7*   CBC:  Recent Labs Lab 10/17/13 0950 10/18/13 0356 10/19/13 0455  WBC 6.5 4.8 9.0  NEUTROABS 5.3  --   --   HGB 11.9* 10.3* 10.3*  HCT 36.1* 31.3* 31.3*  MCV 92.8 94.0 93.2  PLT 118* 92* 106*   BNP (last 3 results)  Recent Labs  11/14/12 0800  11/20/12 1954 10/17/13 0950  PROBNP 1488.0* 2537.0* 214.5   Studies: Dg Chest 1 View  10/18/2013   CLINICAL DATA:  Post right thoracentesis  EXAM: CHEST - 1 VIEW  COMPARISON:  10/17/2013  FINDINGS: Cardiomediastinal silhouette is stable. The right pleural effusion is resolved. No diagnostic pneumothorax. Mild left basilar atelectasis.  IMPRESSION: Right pleural effusion is resolved. No diagnostic pneumothorax. Mild left basilar atelectasis.   Electronically Signed   By: Natasha MeadLiviu  Pop M.D.   On: 10/18/2013 16:10   Dg Hip Operative Left  10/17/2013   CLINICAL DATA:  Status post ORIF for a left hip fracture  EXAM: OPERATIVE LEFT HIP  COMPARISON:  DG HIP COMPLETE*L* dated 10/17/2013  FINDINGS: The patient has undergone placement of an intramedullary rod and telescoping screw for an intertrochanteric fracture of the right hip. The fracture fragments are more nearly anatomic in alignment.  IMPRESSION: The patient has undergone ORIF for an intratrochanteric fracture of the left hip. There is no evidence of immediate postprocedure complication.   Electronically Signed   By: David  SwazilandJordan   On: 10/17/2013 16:38   [place] Thoracentesis Asp Pleural Space W/img Guide  10/18/2013   CLINICAL DATA:  Diastolic heart failure, right pleural effusion. Request is made for diagnostic and therapeutic right thoracentesis.  EXAM: ULTRASOUND GUIDED DIAGNOSTIC AND THERAPEUTIC RIGHT THORACENTESIS  COMPARISON:  None.  FINDINGS: A total of approximately 1.2 liters of yellow fluid was removed. The fluid sample wassent for laboratory analysis.  IMPRESSION: Successful ultrasound guided diagnostic and therapeutic right thoracentesis yielding 1.2 liters of pleural fluid.  Read by: Jeananne RamaKevin Allred ,P.A.-C.  PROCEDURE: An ultrasound guided thoracentesis was thoroughly discussed with the patient and questions answered. The benefits, risks, alternatives and complications were also discussed. The patient understands and wishes to proceed with the  procedure. Written consent was obtained.  Ultrasound was performed to localize and mark an adequate pocket of fluid in the right chest. The area was then prepped and draped in the normal sterile fashion. 1% Lidocaine was used for local anesthesia. Under ultrasound guidance a 19 gauge Yueh catheter was introduced. Thoracentesis was performed. The catheter was removed and a dressing applied.  Complications:  none   Electronically Signed   By: Simonne ComeJohn  Watts M.D.   On: 10/18/2013 17:01    Scheduled Meds: . allopurinol  100 mg Oral Daily  . azithromycin  250 mg Intravenous Q24H  . cefTRIAXone (ROCEPHIN)  IV  1 g Intravenous Q24H  . docusate sodium  100 mg Oral BID  . enoxaparin (LOVENOX) injection  30 mg Subcutaneous Daily  . ferrous sulfate  325 mg Oral BID WC  . tamsulosin  0.4 mg Oral BID   Continuous Infusions: . sodium chloride 20 mL/hr at 10/18/13 1127    Active Problems:   HYPERTENSION   BENIGN PROSTATIC HYPERTROPHY   Diastolic CHF   Femoral neck fracture   Hip fracture   CKD (chronic kidney disease) stage 3, GFR 30-59 ml/min   Pleural effusion  Time spent: 25  This note has been created with Education officer, environmental. Any transcriptional errors are unintentional.   Pamella Pert, MD [facility] Pager [phone]. If 7 PM - 7 AM, please contact night-coverage at [email or web address], password [facility] 10/19/2013, 2:27 PM  LOS: 2 days

## 2013-10-19 NOTE — Progress Notes (Signed)
Physical Therapy Treatment Patient Details Name: [name] MRN: [id number] DOB: 06/04/1925 Today's Date: 10/19/2013 Time: 0141-0206 PT Time Calculation (min): 25 min  PT Assessment / Plan / Recommendation  History of Present Illness 80 yo male s/p L IM nail intertrochanteric femur 2/26, L knee joint effusion, questionable L shulder rotator tear. Hx of R THA, arthritis   PT Comments   Pt with improved sit to stand today, and did well with short distance gait.  Pt unable to walk further due to feeling "foggy" and fatigued which pt attributes to pain meds.  Pt does report no pain.  Continue to recommend SNF for short term rehab.  Follow Up Recommendations  SNF     Does the patient have the potential to tolerate intense rehabilitation     Barriers to Discharge        Equipment Recommendations  None recommended by PT    Recommendations for Other Services    Frequency Min 4X/week   Progress towards PT Goals Progress towards PT goals: Progressing toward goals  Plan Discharge plan needs to be updated    Precautions / Restrictions Precautions Precautions: Fall Restrictions LLE Weight Bearing: Partial weight bearing LLE Partial Weight Bearing Percentage or Pounds: 50%   Pertinent Vitals/Pain Pt denies pain.    Mobility  Bed Mobility Supine to sit: Mod assist;HOB elevated General bed mobility comments: use of bed pad to A getting hips forward.  pt used trapeze bar to assist and rail for UE. Transfers Overall transfer level: Needs assistance Equipment used: Rolling walker (2 wheeled) Transfers: Sit to/from Stand Sit to Stand: Mod assist General transfer comment:  (cues for proper technique. A for getting PWB L LE.) Ambulation/Gait Ambulation/Gait assistance: Min assist Ambulation Distance (Feet): 2 Feet (bed to recliner) Assistive device: Rolling walker (2 wheeled) Gait Pattern/deviations: Step-to pattern;Decreased step length - right General Gait Details: Pt did well  with giat but very limited due to feeling "foggy"  and sleepy and wanting to sit.    Exercises General Exercises - Lower Extremity Ankle Circles/Pumps: AROM;Both;10 reps Gluteal Sets: Strengthening;10 reps;Supine Short Arc Quad: Strengthening;Left;5 reps;Supine Heel Slides: AROM;Left;10 reps;Supine Hip ABduction/ADduction: Left;AAROM;AROM;10 reps;Supine   PT Diagnosis:    PT Problem List:   PT Treatment Interventions:     PT Goals (current goals can now be found in the care plan section)    Visit Information  Last PT Received On: 10/19/13 Assistance Needed: +2 History of Present Illness: 80 yo male s/p L IM nail intertrochanteric femur 2/26, L knee joint effusion, questionable L shulder rotator tear. Hx of R THA, arthritis    Subjective Data  Subjective: "I am normally more awake than this.  I had 2 Vicodin and they are making me sleepy."   Cognition  Cognition Arousal/Alertness: Lethargic;Suspect due to medications (Pt reports its due to the vicodin he had earlier) Behavior During Therapy: [facility] for tasks assessed/performed Overall Cognitive Status: Within Functional Limits for tasks assessed    Balance     End of Session PT - End of Session Equipment Utilized During Treatment: Gait belt Activity Tolerance: Patient limited by lethargy Patient left: in chair;with call bell/phone within reach;Other (comment) (family in waiting room & notified pt up in chair) Nurse Communication: Mobility status (Nurse aware PT in with pt.)   GP     Azizi Bally LUBECK 10/19/2013, 2:21 PM

## 2013-10-19 NOTE — ED Provider Notes (Signed)
Medical screening examination/treatment/procedure(s) were conducted as a shared visit with non-physician practitioner(s) and myself.  I personally evaluated the patient during the encounter.   EKG Interpretation   Date/Time:  Thursday October 17 2013 09:50:50 EST Ventricular Rate:  90 PR Interval:  136 QRS Duration: 73 QT Interval:  349 QTC Calculation: 427 R Axis:   -16 Text Interpretation:  Sinus rhythm Low voltage, extremity leads Abnormal  R-wave progression, early transition No significant change since last  tracing Confirmed by Caci Orren  MD, Joscelyn Hardrick ([id number]) on 10/17/2013 9:55:24 AM      Patient with mechanical fall and left hip fx. Otherwise well appearing. Pain controlled. D/w Dr. Charlann Boxerlin, Ortho to take to OR today, will admit to medicine.  Audree CamelScott T Shelbie Franken, MD 10/19/13 [phone]

## 2013-10-20 ENCOUNTER — Inpatient Hospital Stay (HOSPITAL_COMMUNITY): Payer: Medicare Other

## 2013-10-20 LAB — CBC
HCT: 30.7 % — ABNORMAL LOW (ref 39.0–52.0)
Hemoglobin: 10.4 g/dL — ABNORMAL LOW (ref 13.0–17.0)
MCH: 31.2 pg (ref 26.0–34.0)
MCHC: 33.9 g/dL (ref 30.0–36.0)
MCV: 92.2 fL (ref 78.0–100.0)
Platelets: 118 10*3/uL — ABNORMAL LOW (ref 150–400)
RBC: 3.33 MIL/uL — ABNORMAL LOW (ref 4.22–5.81)
RDW: 14.5 % (ref 11.5–15.5)
WBC: 6.5 10*3/uL (ref 4.0–10.5)

## 2013-10-20 LAB — BASIC METABOLIC PANEL
BUN: 26 mg/dL — ABNORMAL HIGH (ref 6–23)
CALCIUM: 8.2 mg/dL — AB (ref 8.4–10.5)
CO2: 22 mEq/L (ref 19–32)
Chloride: 104 mEq/L (ref 96–112)
Creatinine, Ser: 1.15 mg/dL (ref 0.50–1.35)
GFR, EST AFRICAN AMERICAN: 65 mL/min — AB (ref >90)
GFR, EST NON AFRICAN AMERICAN: 56 mL/min — AB (ref >90)
GLUCOSE: 108 mg/dL — AB (ref 70–99)
POTASSIUM: 4.2 meq/L (ref 3.7–5.3)
SODIUM: 138 meq/L (ref 137–147)

## 2013-10-20 MED ORDER — CYCLOBENZAPRINE HCL 5 MG PO TABS
5.0000 mg | ORAL_TABLET | Freq: Once | ORAL | Status: AC
  Administered 2013-10-20: 5 mg via ORAL
  Filled 2013-10-20: qty 1

## 2013-10-20 NOTE — Progress Notes (Addendum)
Patient called RN to room with complaint of pain in his non-operative (right) leg behind his knee, radiating to his groin area. Patient described the pain as a sharp, stabbing pain. Pulses and cap refill good on the right side. Tylenol administered as patient did not wish to take anything stronger. NP on call paged and right lower extremity doppler ordered. Shortly after, patient called RN to room again. Said the pain had left his right leg and moved to the left leg and groin area. Said pain was moving up and down his left leg, still described it as sharp and stabbing pain. Pulses and cap refill also good on the left side. Repaged NP on call, who cancelled RLE doppler for now and said she would pass info on to rounding MD to evaluate. Patient agreed to take 0.5 mg IV morphine for pain. Flexeril also ordered for patient. Will continue to monitor.

## 2013-10-20 NOTE — Progress Notes (Signed)
Bilateral lower extremity venous duplex order received and appreciated.  I attempted to perform the study, however due to the patient's contracted state, I was unable to obtain any images. Please advise if patient condition changes.  10/20/2013 8:27 AM Gertie FeyMichelle Tamir Wallman, RVT, RDCS, RDMS

## 2013-10-20 NOTE — Progress Notes (Signed)
PROGRESS NOTE  Jordan Gallusarl L Horn NFA:[id number] DOB: May 16, 1925 DOA: 10/17/2013 PCP: Rogelia BogaKWIATKOWSKI,PETER FRANK, MD  Assessment/Plan:  Left intertrochanteric hip fracture  - Status post repair per orthopedic surgery. Prophylaxis postop per orthopedic surgery.  - Will get PT to evaluate.  - complained of pain overnight, avoids moving his legs this morning ?more contracted. Repeat hip XR. Diastolic heart failure  - Patient without decompensation at this point. He has no peripheral edema, no JVD, no crackles on exam. His BNP is within normal limits. - repeat 2D echo with normal ejection fraction of 55-60% and grade 1 diastolic dysfunction. Right-sided pleural effusion  - This is new but I doubt this acute, patient without any complaints, breathing comfortable on room air, suspect a chronic component. Continue empiric antibiotic coverage for community-acquired pneumonia given CXR. - US guided thoracentesis for fluid analysis, stain and cultures. Fluid analysis points toward this being an exudate by light criteria. ?Parapneumonic versus malignant versus PE. Cytology pending, patient without tachycardia, dyspnea, PE unlikely. We'll repeat a chest x-ray on Monday, and patient probably will need to repeat chest x-ray in one to 2 weeks following discharge to evaluate for recurrence. Hypertension - stable BPH - foley  Diet: advance as tolerated Fluids: KVO DVT Prophylaxis: Lovenox  Code Status: DNR Family Communication: Daughter and wife  Disposition Plan: inpatient  Consultants:  Orthopedic surgery   Procedures:  none   Antibiotics  Anti-infectives   Start     Dose/Rate Route Frequency Ordered Stop   10/20/13 1000  azithromycin (ZITHROMAX) tablet 500 mg     500 mg Oral Daily 10/19/13 1451     10/17/13 1200  azithromycin (ZITHROMAX) 250 mg in dextrose 5 % 125 mL IVPB  Status:  Discontinued     250 mg 125 mL/hr over 60 Minutes Intravenous Every 24 hours 10/17/13 1139 10/19/13 1451   10/17/13 1145  cefTRIAXone (ROCEPHIN) 1 g in dextrose 5 % 50 mL IVPB     1 g 100 mL/hr over 30 Minutes Intravenous Every 24 hours 10/17/13 1139       Antibiotics Given (last 72 hours)   Date/Time Action Medication Dose Rate   10/17/13 1502 Given   azithromycin (ZITHROMAX) 250 mg in dextrose 5 % 125 mL IVPB 250 mg    10/18/13 1128 Given   azithromycin (ZITHROMAX) 250 mg in dextrose 5 % 125 mL IVPB 250 mg 125 mL/hr   10/19/13 1241 Given   azithromycin (ZITHROMAX) 250 mg in dextrose 5 % 125 mL IVPB 250 mg 125 mL/hr      HPI/Subjective: - feeling better, but states his legs are more "rigid"  Objective: Filed Vitals:   10/20/13 0327 10/20/13 0400 10/20/13 0427 10/20/13 0529  BP: 160/75  150/72 170/85  Pulse: 104  97 103  Temp: 99.2 F (37.3 C)  98.6 F (37 C)   TempSrc: Oral  Oral   Resp: 20 20 20    Height:      Weight:      SpO2: 97% 96% 98% 98%    Intake/Output Summary (Last 24 hours) at 10/20/13 0753 Last data filed at 10/20/13 0700  Gross per 24 hour  Intake 999.67 ml  Output   1350 ml  Net -350.33 ml   Filed Weights   10/17/13 2005 10/19/13 0443  Weight: 61.1 kg (134 lb 11.2 oz) 59.1 kg (130 lb 4.7 oz)   Exam:  General:  NAD  Cardiovascular: regular rate and rhythm, without MRG  Respiratory: good air movement,, no wheezing, ronchi or  rales  Abdomen: soft, not tender to palpation, positive bowel sounds  MSK: no peripheral edema Neuro: grossly non focal. Strength 5/5 in lower extremities, sensation intact.   Data Reviewed: Basic Metabolic Panel:  Recent Labs Lab 10/17/13 0950 10/18/13 0356 10/19/13 0455 10/20/13 0450  NA 141 140 137 138  K 4.1 5.6* 4.6 4.2  CL 109 108 104 104  CO2 20 21 20 22   GLUCOSE 99 150* 98 108*  BUN 33* 31* 32* 26*  CREATININE 1.37* 1.29 1.25 1.15  CALCIUM 9.1 8.4 8.3* 8.2*  MG  --  2.0  --   --   PHOS  --  4.6  --   --    Liver Function Tests:  Recent Labs Lab 10/18/13 0356  AST 21  ALT 12  ALKPHOS 91  BILITOT  0.6  PROT 5.0*  ALBUMIN 2.7*   CBC:  Recent Labs Lab 10/17/13 0950 10/18/13 0356 10/19/13 0455 10/20/13 0450  WBC 6.5 4.8 9.0 6.5  NEUTROABS 5.3  --   --   --   HGB 11.9* 10.3* 10.3* 10.4*  HCT 36.1* 31.3* 31.3* 30.7*  MCV 92.8 94.0 93.2 92.2  PLT 118* 92* 106* 118*   BNP (last 3 results)  Recent Labs  11/14/12 0800 11/20/12 1954 10/17/13 0950  PROBNP 1488.0* 2537.0* 214.5   Studies: Dg Chest 1 View  10/18/2013   CLINICAL DATA:  Post right thoracentesis  EXAM: CHEST - 1 VIEW  COMPARISON:  10/17/2013  FINDINGS: Cardiomediastinal silhouette is stable. The right pleural effusion is resolved. No diagnostic pneumothorax. Mild left basilar atelectasis.  IMPRESSION: Right pleural effusion is resolved. No diagnostic pneumothorax. Mild left basilar atelectasis.   Electronically Signed   By: Natasha Mead M.D.   On: 10/18/2013 16:10   US Thoracentesis Asp Pleural Space W/img Guide  10/18/2013   CLINICAL DATA:  Diastolic heart failure, right pleural effusion. Request is made for diagnostic and therapeutic right thoracentesis.  EXAM: ULTRASOUND GUIDED DIAGNOSTIC AND THERAPEUTIC RIGHT THORACENTESIS  COMPARISON:  None.  FINDINGS: A total of approximately 1.2 liters of yellow fluid was removed. The fluid sample wassent for laboratory analysis.  IMPRESSION: Successful ultrasound guided diagnostic and therapeutic right thoracentesis yielding 1.2 liters of pleural fluid.  Read by: Jeananne Rama ,P.A.-C.  PROCEDURE: An ultrasound guided thoracentesis was thoroughly discussed with the patient and questions answered. The benefits, risks, alternatives and complications were also discussed. The patient understands and wishes to proceed with the procedure. Written consent was obtained.  Ultrasound was performed to localize and mark an adequate pocket of fluid in the right chest. The area was then prepped and draped in the normal sterile fashion. 1% Lidocaine was used for local anesthesia. Under ultrasound  guidance a 19 gauge Yueh catheter was introduced. Thoracentesis was performed. The catheter was removed and a dressing applied.  Complications:  none   Electronically Signed   By: Simonne Come M.D.   On: 10/18/2013 17:01    Scheduled Meds: . allopurinol  100 mg Oral Daily  . azithromycin  500 mg Oral Daily  . cefTRIAXone (ROCEPHIN)  IV  1 g Intravenous Q24H  . docusate sodium  100 mg Oral BID  . enoxaparin (LOVENOX) injection  30 mg Subcutaneous Daily  . ferrous sulfate  325 mg Oral BID WC  . tamsulosin  0.4 mg Oral BID   Continuous Infusions: . sodium chloride 20 mL/hr at 10/18/13 1127    Active Problems:   HYPERTENSION   BENIGN PROSTATIC HYPERTROPHY   Diastolic  CHF   Femoral neck fracture   Hip fracture   CKD (chronic kidney disease) stage 3, GFR 30-59 ml/min   Pleural effusion  Time spent: 25  This note has been created with Education officer, environmental. Any transcriptional errors are unintentional.   Pamella Pert, MD [facility] Pager [phone]. If 7 PM - 7 AM, please contact night-coverage at [email or web address], password [facility] 10/20/2013, 7:53 AM  LOS: 3 days

## 2013-10-21 ENCOUNTER — Encounter: Payer: Self-pay | Admitting: *Deleted

## 2013-10-21 ENCOUNTER — Inpatient Hospital Stay (HOSPITAL_COMMUNITY): Payer: Medicare Other

## 2013-10-21 ENCOUNTER — Other Ambulatory Visit: Payer: Self-pay | Admitting: *Deleted

## 2013-10-21 MED ORDER — LEVOFLOXACIN 500 MG PO TABS
500.0000 mg | ORAL_TABLET | Freq: Every day | ORAL | Status: DC
  Administered 2013-10-21: 500 mg via ORAL
  Filled 2013-10-21: qty 1

## 2013-10-21 MED ORDER — MORPHINE SULFATE 2 MG/ML IJ SOLN
1.0000 mg | INTRAMUSCULAR | Status: DC | PRN
  Administered 2013-10-21: 1 mg via INTRAVENOUS

## 2013-10-21 MED ORDER — LEVOFLOXACIN 500 MG PO TABS: 500.0000 mg | ORAL_TABLET | Freq: Every day | ORAL | Status: DC

## 2013-10-21 MED ORDER — HYDROCODONE-ACETAMINOPHEN 5-325 MG PO TABS: ORAL_TABLET | ORAL | Status: DC

## 2013-10-21 NOTE — Progress Notes (Signed)
Physical Therapy Treatment Patient Details Name: [name] L Schaff MRN: [id number] DOB: Dec 03, 1924 Today's Date: 10/21/2013 Time: 1210-1239 PT Time Calculation (min): 29 min  PT Assessment / Plan / Recommendation  History of Present Illness 80 yo male s/p L IM nail intertrochanteric femur 2/26, L knee joint effusion, questionable L shulder rotator tear. Hx of R THA, arthritis   PT Comments   Progressing slowly with mobility. Pt did not perform quite as well as he did on eval but better than last session. Fatigues easily with mobility. Pt still noted to have significant L knee flexion ~20-30 degrees from full extension with active quad set. Pt denied pain during session. Plan is for possible d/c to rehab on today.   Follow Up Recommendations  SNF     Does the patient have the potential to tolerate intense rehabilitation     Barriers to Discharge        Equipment Recommendations  None recommended by PT    Recommendations for Other Services OT consult  Frequency Min 3X/week   Progress towards PT Goals Progress towards PT goals: Progressing toward goals (slowly)  Plan Current plan remains appropriate    Precautions / Restrictions Precautions Precautions: Fall Restrictions Weight Bearing Restrictions: Yes LLE Weight Bearing: Partial weight bearing LLE Partial Weight Bearing Percentage or Pounds: 50%   Pertinent Vitals/Pain Pt denies pain    Mobility  Bed Mobility Overal bed mobility: Needs Assistance Bed Mobility: Supine to Sit Supine to sit: HOB elevated;Min assist General bed mobility comments: Assist for L LE off EOB. Increased time. Pt relied on bedrail quite a bit.  Transfers Overall transfer level: Needs assistance Transfers: Sit to/from Stand Sit to Stand: Mod assist;From elevated surface General transfer comment: Assist to rise, stabilize, control descent. VCs safety, technique, hand placement, adherence to WB status.  Ambulation/Gait Ambulation/Gait assistance: Min  assist Ambulation Distance (Feet): 7 Feet Assistive device: Rolling walker (2 wheeled) Gait Pattern/deviations: Step-to pattern;Decreased stance time - left;Trunk flexed General Gait Details: Assist to stabilize throughout ambulation. VC safety, technique, adherence to WB status. Pt primarily using TDWB-PWB status (definitely less than 50%). Maintains flexed L knee posturing. Fatigues easily. Followed with recliner.     Exercises General Exercises - Lower Extremity Ankle Circles/Pumps: AROM;Both;20 reps;Seated Quad Sets: AROM;Both;10 reps;Seated Short Arc Quad: AROM;Left;10 reps;Seated Heel Slides: AAROM;AROM;Left;10 reps;Seated Hip ABduction/ADduction: AAROM;Left;10 reps;Seated   PT Diagnosis:    PT Problem List:   PT Treatment Interventions:     PT Goals (current goals can now be found in the care plan section)    Visit Information  Last PT Received On: 10/21/13 Assistance Needed: +1 History of Present Illness: 80 yo male s/p L IM nail intertrochanteric femur 2/26, L knee joint effusion, questionable L shulder rotator tear. Hx of R THA, arthritis    Subjective Data      Cognition  Cognition Arousal/Alertness: Awake/alert Behavior During Therapy: WFL for tasks assessed/performed Overall Cognitive Status: Within Functional Limits for tasks assessed    Balance     End of Session PT - End of Session Equipment Utilized During Treatment: Gait belt Activity Tolerance: Patient limited by fatigue Patient left: in chair;with call bell/phone within reach   GP     Rebeca AlertJannie Melburn Treiber, MPT Pager: [phone]

## 2013-10-21 NOTE — Progress Notes (Signed)
[facility]. Hospital course and discharge instructions reviewed. All questions answered. Julio SicksK. Ourania Hamler RN

## 2013-10-21 NOTE — Evaluation (Signed)
Clinical/Bedside Swallow Evaluation Patient Details  Name: [name] MRN: [id number] Date of Birth: 06-Oct-1924  Today's Date: 10/21/2013 Time: 4098-1191 SLP Time Calculation (min): 29 min  Past Medical History:  Past Medical History  Diagnosis Date  . Gout   . Insomnia   . BPH (benign prostatic hyperplasia)   . Kidney disorder   . Arthritis   . Hypertension     patient denies on 10/25/12 on no meds   . Complication of anesthesia     hard to wake up -2011 after hernia surgery    Past Surgical History:  Past Surgical History  Procedure Laterality Date  . Cataract extraction    . Cholecystectomy    . Hernia repair  02/22/11    right  . Joint replacement      fractured hip  . Total hip arthroplasty Right 10/29/2012    Procedure: CONVERSION OF PREVIOUS HIP SURGERY TO RIGHT TOTAL HIP AND REMOVAL OF IM NAIL ;  Surgeon: Shelda Pal, MD;  Location: [facility];  Service: Orthopedics;  Laterality: Right;  . Esophagogastroduodenoscopy N/A 11/16/2012    Procedure: ESOPHAGOGASTRODUODENOSCOPY (EGD);  Surgeon: Theda Belfast, MD;  Location: [facility];  Service: Endoscopy;  Laterality: N/A;  . Intramedullary (im) nail intertrochanteric Left 10/17/2013    Procedure: INTRAMEDULLARY (IM) NAIL INTERTROCHANTRIC;  Surgeon: Shelda Pal, MD;  Location: [facility];  Service: Orthopedics;  Laterality: Left;   HPI:  80 yo male adm to [facility] after falling and suffering from hip fx, s/p surgical repair.  Post op complicated by pt having right sided pleural effusion requiring thoracentisis 2-27.  Pt reported severe dysphagia after surgery that has now improved.  PMH + for heart failure, previous inguinal hernia repair.       Assessment / Plan / Recommendation Clinical Impression  Pt presents with functional oropharyngeal swallow based on clinical evaluation including negative cranial nerve exam.  Has h/o esoph issues including esophagitis, schatzki's ring, hiatal hernia diagnosed in March 2014.  Pt was placed  on a PPI at that time - he is not on one currently however.  Pt reports issues with regurgitation of food x3 weeks ago at home.   Pt observed today consuming water and cookie, denies any symptoms of dysphagia- voice remained clear throughout with timely swallow.  Suspect given known h/o known esophageal dysphagia, pt with transient exacerbation of dysphagia after surgery that has now resolved to baseline level.  Educated pt to findings and recommendations.    Pt reports poor appetite with significant weight loss (40 pounds since last orthopedic sx) and therefore slp recommend dietician consult to assure pt is maximizing nutrition.  SLP to sign off, thanks for referral.      Aspiration Risk  Mild    Diet Recommendation Regular;Thin liquid   Liquid Administration via: Cup;Straw Medication Administration: Whole meds with liquid Supervision: Patient able to self feed Compensations: Slow rate;Small sips/bites (consume liquids t/o meal) Postural Changes and/or Swallow Maneuvers: Seated upright 90 degrees;Upright 30-60 min after meal    Other  Recommendations Oral Care Recommendations: Oral care BID   Follow Up Recommendations  None    Frequency and Duration        Pertinent Vitals/Pain Afebrile, decreased       Swallow Study Prior Functional Status   see hhx    General Date of Onset: 10/21/13 HPI: 80 yo male adm to [facility] after falling and suffering from hip fx, s/p surgical repair.  Post op complicated by pt having right  sided pleural effusion requiring thoracentisis 2-27.  Pt reported severe dysphagia after surgery that has now improved.  PMH + for heart failure, previous inguinal hernia repair.     Type of Study: Bedside swallow evaluation Diet Prior to this Study: Regular;Thin liquids Temperature Spikes Noted: No Respiratory Status: Room air History of Recent Intubation: Yes (for surgery) Behavior/Cognition: Alert;Cooperative;Pleasant mood Oral Cavity - Dentition: Adequate natural  dentition Self-Feeding Abilities: Able to feed self Patient Positioning: Upright in bed Baseline Vocal Quality: Clear Volitional Cough:  (dnt due to pt pain with movement) Volitional Swallow: Able to elicit    Oral/Motor/Sensory Function Overall Oral Motor/Sensory Function: Appears within functional limits for tasks assessed   Ice Chips Ice chips: Not tested   Thin Liquid Thin Liquid: Within functional limits Presentation: Self Fed;Straw    Nectar Thick Nectar Thick Liquid: Not tested   Honey Thick Honey Thick Liquid: Not tested   Puree Puree: Not tested   Solid   GO    Solid: Within functional limits Presentation: [address]       Mickie BailKimball, Barth Trella Ann Jasie Meleski ClintonKimball, MS [facility] SLP [phone]

## 2013-10-21 NOTE — Telephone Encounter (Signed)
[facility] 

## 2013-10-21 NOTE — Progress Notes (Signed)
[name], male   DOB: April 26, 1925, 80 y.o.   MRN: [id number] Subjective: 4 Days Post-Op Procedure(s) (LRB): INTRAMEDULLARY (IM) NAIL INTERTROCHANTRIC (Left)    Patient reports pain as moderate but improving some after weekend.  RLE concerns reviewed.  Hip X-rays reviewed from weekend  Objective:   VITALS:   Filed Vitals:   10/21/13 0513  BP: 108/49  Pulse: 92  Temp: 98.9 F (37.2 C)  Resp: 16    Neurovascular intact Incision: moderate drainage, dried, most likely from middle incision site  LABS  Recent Labs  10/19/13 0455 10/20/13 0450  HGB 10.3* 10.4*  HCT 31.3* 30.7*  WBC 9.0 6.5  PLT 106* 118*     Recent Labs  10/19/13 0455 10/20/13 0450  NA 137 138  K 4.6 4.2  BUN 32* 26*  CREATININE 1.25 1.15  GLUCOSE 98 108*    No results found for this basename: LABPT, INR,  in the last 72 hours   Assessment/Plan: 4 Days Post-Op Procedure(s) (LRB): INTRAMEDULLARY (IM) NAIL INTERTROCHANTRIC (Left)   Up with therapy Discharge to SNF when medically stable  Hip X-rays this weekend were with out complication both hips stable He has developed significant pre-operative flexion contractures related to inactivity Thus activity as appropriate and possible with therapy  Follow up in 2 weeks Rx on chart for pain meds  Dressing changes to left hip as needed, reviewed with nursing

## 2013-10-21 NOTE — Progress Notes (Signed)
[facility] is [facility] to [facility]. Patient & family at bedside aware. Discharge packet given to RN, Charline BillsKristyn. PTAR scheduled for 2pm pickup (Service Request Id: [id number]).   Clinical Social Work Department CLINICAL SOCIAL WORK PLACEMENT NOTE 10/21/2013  Patient:  Jordan GallusBRAMBLETT,[name]  Account Number:  [id number] Admit date:  10/17/2013  Clinical Social Worker:  Orpah GreekKELLY FOLEY, LCSWA  Date/time:  10/18/2013 02:30 PM  Clinical Social Work is seeking post-discharge placement for this patient at the following level of care:   SKILLED NURSING   (*CSW will update this form in Epic as items are completed)   10/18/2013  Patient/family provided with [facility] Department of Clinical Social Work's list of facilities offering this level of care within the geographic area requested by the patient (or if unable, by the patient's family).  10/18/2013  Patient/family informed of their freedom to choose among providers that offer the needed level of care, that participate in Medicare, Medicaid or managed care program needed by the patient, have an available bed and are willing to accept the patient.  10/18/2013  Patient/family informed of [facility]' ownership interest in [facility], as well as of the fact that they are under no obligation to receive care at this facility.  PASARR submitted to EDS on 10/18/2013 PASARR number received from EDS on 10/18/2013  FL2 transmitted to all facilities in geographic area requested by pt/family on  10/18/2013 FL2 transmitted to all facilities within larger geographic area on   Patient informed that his/her managed care company has contracts with or will negotiate with  certain facilities, including the following:     Patient/family informed of bed offers received:  10/18/2013 [facility] Physician recommends and patient chooses bed at    [facility]  10/21/2013 Patient to be  transferred to facility by PTAR  The following physician request were entered in Epic:   Additional Comments:   Lincoln MaxinKelly Gerod Caligiuri, LCSW [facility] Clinical Social Worker cell #: [phone]

## 2013-10-21 NOTE — Discharge Summary (Signed)
Physician Discharge Summary  [name]:[id number] DOB: [birth year] DOA: 10/17/2013  PCP: Rogelia Boga, MD  Admit date: 10/17/2013 Discharge date: 10/21/2013  Time spent: 35 minutes  Recommendations for Outpatient Follow-up:  1. Follow up with Dr. Lesia Hausen in 1-2 weeks 2. Follow up with Dr. Charlann Boxer in 1-2 weeks  3. Follow up with Dr. Brunilda Payor in 1-2 weeks  Recommendations for primary care physician for things to follow:  Repeat CXR Follow up on cytology results  Discharge Diagnoses:  Active Problems:   HYPERTENSION   BENIGN PROSTATIC HYPERTROPHY   Diastolic CHF   Femoral neck fracture   Hip fracture   CKD (chronic kidney disease) stage 3, GFR 30-59 ml/min   Pleural effusion  Discharge Condition: stable  Diet recommendation: heart healthy  Filed Weights   10/17/13 2005 10/19/13 0443 10/21/13 0513  Weight: 61.1 kg (134 lb 11.2 oz) 59.1 kg (130 lb 4.7 oz) 61.1 kg (134 lb 11.2 oz)   History of present illness:  [name] is a 80 y.o. male has a past medical history significant for hypertension, BPH, congestive diastolic heart failure, CKD stage III with baseline creatinine around 1.6-1.9-2.0 as far back as 2009, and other problems as per below, presents to the emergency room with a chief complaint of a fall in his home. Patient denies any chest pain, syncopal episode, loss of consciousness. He denies any fever or chills. He has no cough. He reports mild episode of wheezing a week ago and mild cough. He denies any lightheadedness or dizziness, denies any headache. He has had falls in the past. He denies any burning with diminished or dysuria. In the emergency room, patient was found to have a displaced left intertrochanteric fracture. Orthopedic surgery has been consulted by the ED physician, and [facility] has been asked to admit.  Hospital Course:  Left intertrochanteric hip fracture - Status post repair per orthopedic surgery. Prophylaxis postop per orthopedic  surgery with Lovenox as below. Patient with perioperative flexion contractures, would benefit from continued PT.  Diastolic heart failure - Patient without decompensation at this point. He has no peripheral edema, no JVD, no crackles on exam. His BNP is within normal limits. Repeat 2D echo with normal ejection fraction of 55-60% and grade 1 diastolic dysfunction.  Right-sided pleural effusion This is new but I doubt this acute, patient without any complaints, breathing comfortable on room air, suspect a chronic component. Continue empiric antibiotic coverage for community-acquired pneumonia given CXR and he is to complete 5 additional days of Levofloxacin. US guided thoracentesis for fluid analysis, stain and cultures on 2/27. Fluid analysis points toward this being an exudate by light criteria. ?Parapneumonic versus malignant versus PE. Cytology pending at the time of discharge, patient without tachycardia, dyspnea, PE unlikely. Repeat chest x-ray on date of discharge without recurrence, and patient probably will need to repeat chest x-ray in one to 2 weeks following discharge to evaluate for recurrence.  Hypertension - stable  BPH - foley  Procedures:  IM nail intertrochanteric Left (Dr. Charlann Boxer)   2D echo Study Conclusions - Left ventricle: The cavity size was normal. Wall thickness was normal. Systolic function was normal. The estimated ejection fraction was in the range of 55% to 60%. Wall motion was normal; there were no regional wall motion abnormalities. Doppler parameters are consistent with abnormal left ventricular relaxation (grade 1 diastolic dysfunction). - Aortic valve: Trivial regurgitation.   US guided thoracentesis  Consultations:  Orthopedic surgery  Discharge Exam: Filed Vitals:   10/20/13  2135 10/21/13 0000 10/21/13 0400 10/21/13 0513  BP: 131/72   108/49  Pulse: 110   92  Temp: 99.1 F (37.3 C)   98.9 F (37.2 C)  TempSrc: Oral   Oral  Resp: 20 16 18 16   Height:       Weight:    61.1 kg (134 lb 11.2 oz)  SpO2: 95% 96% 97% 96%   General: NAD Cardiovascular: RRR Respiratory: CTA biL  Discharge Instructions  Discharge Orders   Future Appointments Provider Department Dept Phone   12/25/2013 9:30 AM Gordy SaversPeter F Kwiatkowski, MD [facility] [phone]   Future Orders Complete By Expires   Partial weight bearing  As directed    Scheduling Instructions:     50%   Questions:     % Body Weight:     Laterality:     Extremity:         Medication List         allopurinol 100 MG tablet  Commonly known as:  ZYLOPRIM  Take 100 mg by mouth daily.     enoxaparin 40 MG/0.4ML injection  Commonly known as:  LOVENOX  Inject 0.4 mLs (40 mg total) into the skin daily.     HYDROcodone-acetaminophen 5-325 MG per tablet  Commonly known as:  NORCO  Take 1-2 tablets by mouth every 6 (six) hours as needed.     ibuprofen 200 MG tablet  Commonly known as:  ADVIL,MOTRIN  Take 200 mg by mouth every 6 (six) hours as needed for moderate pain.     levofloxacin 500 MG tablet  Commonly known as:  LEVAQUIN  Take 1 tablet (500 mg total) by mouth daily.     naphazoline-glycerin 0.012-0.2 % Soln  Commonly known as:  CLEAR EYES  Place 1-2 drops into both eyes every 4 (four) hours as needed for irritation.     potassium chloride 10 MEQ tablet  Commonly known as:  K-DUR,KLOR-CON  Take 10 mEq by mouth daily.     tamsulosin 0.4 MG Caps capsule  Commonly known as:  FLOMAX  Take 0.4 mg by mouth 2 (two) times daily.           Follow-up Information   Follow up with Shelda PalLIN,MATTHEW D, MD. Schedule an appointment as soon as possible for a visit in 2 weeks.   Specialty:  Orthopedic Surgery   Contact information:   [address] [place] [id number] [phone]       The results of significant diagnostics from this hospitalization (including imaging, microbiology, ancillary and laboratory) are listed below for reference.     Significant Diagnostic Studies: Dg Chest 1 View  10/18/2013   CLINICAL DATA:  Post right thoracentesis  EXAM: CHEST - 1 VIEW  COMPARISON:  10/17/2013  FINDINGS: Cardiomediastinal silhouette is stable. The right pleural effusion is resolved. No diagnostic pneumothorax. Mild left basilar atelectasis.  IMPRESSION: Right pleural effusion is resolved. No diagnostic pneumothorax. Mild left basilar atelectasis.   Electronically Signed   By: Natasha MeadLiviu  Pop M.D.   On: 10/18/2013 16:10   Dg Chest 2 View  10/21/2013   CLINICAL DATA:  post pleural effusion  EXAM: CHEST  2 VIEW  COMPARISON:  DG CHEST 1 VIEW dated 10/18/2013  FINDINGS: Low lung volumes. Cardiac silhouette within the upper limits of normal. Aorta is tortuous. Mild increased density projects in the left lower lobe region. There is blunting of the left costophrenic angle. The osseous structures are unremarkable.  IMPRESSION: Atelectasis versus infiltrate  left lung base. Trace effusion versus chronic scarring in the left costophrenic angle region.   Electronically Signed   By: Salome Holmes M.D.   On: 10/21/2013 10:56   Dg Chest 2 View  10/17/2013   CLINICAL DATA:  Hip fracture.  Hypertension.  EXAM: CHEST  2 VIEW  COMPARISON:  DG CHEST 2 VIEW dated 03/17/2013  FINDINGS: Mediastinum and hilar structures are normal. Large right pleural effusion is present. Underlying pulmonary infiltrate cannot be excluded. Low lung volumes with basilar atelectasis. Infiltrate left lung base cannot be excluded. Heart size normal. No pneumothorax. No acute bony abnormality. Degenerative changes both shoulders. Degenerative changes thoracic spine.  IMPRESSION: 1. Large right pleural effusion. Underlying pulmonary infiltrate on the right cannot be excluded . 2. Low lung volumes with basilar atelectasis. Infiltrate left lung base also cannot be excluded.   Electronically Signed   By: Maisie Fus  Register   On: 10/17/2013 10:35   Dg Shoulder 1v Left  10/17/2013   CLINICAL DATA:  Fall,  left shoulder abrasion, hip fracture  EXAM: LEFT SHOULDER - 1 VIEW  COMPARISON:  12/11/2011  FINDINGS: Osseous demineralization.  AC joint degenerative changes.  No acute fracture or dislocation identified on single AP view.  Question chronic rotator cuff tear.  Slight cortical irregularity at the proximal humerus, likely related to the deltoid tubercle or potentially a small osteochondroma.  Visualized left ribs intact.  IMPRESSION: Osseous demineralization with AC joint degenerative changes and question chronic rotator cuff tear.  No acute abnormalities.   Electronically Signed   By: Ulyses Southward M.D.   On: 10/17/2013 09:49   Dg Hip Complete Left  10/17/2013   CLINICAL DATA:  Left hip pain post fall  EXAM: LEFT HIP - COMPLETE 2+ VIEW  COMPARISON:  Pelvic radiograph 10/29/2012  FINDINGS: Right hip prosthesis.  Interval displaced low left femoral neck fracture without definite intertrochanteric extension.  Mild varus angulation.  Pelvis appears intact.  SI joints symmetric.  No additional fractures identified.  IMPRESSION: Displaced left femoral neck fracture.  Right hip prosthesis.  Osseous demineralization.   Electronically Signed   By: Ulyses Southward M.D.   On: 10/17/2013 09:11   Dg Hip Operative Left  10/17/2013   CLINICAL DATA:  Status post ORIF for a left hip fracture  EXAM: OPERATIVE LEFT HIP  COMPARISON:  DG HIP COMPLETE*L* dated 10/17/2013  FINDINGS: The patient has undergone placement of an intramedullary rod and telescoping screw for an intertrochanteric fracture of the right hip. The fracture fragments are more nearly anatomic in alignment.  IMPRESSION: The patient has undergone ORIF for an intratrochanteric fracture of the left hip. There is no evidence of immediate postprocedure complication.   Electronically Signed   By: David  [place]   On: 10/17/2013 16:38   Ct Head Wo Contrast  10/17/2013   CLINICAL DATA:  Fall.  EXAM: CT HEAD WITHOUT CONTRAST  CT CERVICAL SPINE WITHOUT CONTRAST  TECHNIQUE:  Multidetector CT imaging of the head and cervical spine was performed following the standard protocol without intravenous contrast. Multiplanar CT image reconstructions of the cervical spine were also generated.  COMPARISON:  09/04/2012.  FINDINGS: CT HEAD FINDINGS  No skull fracture or intracranial hemorrhage.  Small vessel disease type changes without CT evidence of large acute infarct.  No intracranial mass lesion noted on this unenhanced exam.  Vascular calcifications.  CT CERVICAL SPINE FINDINGS  No cervical spine fracture or malalignment. No abnormal prevertebral soft tissue swelling.  Cervical spondylotic changes including prominent transverse ligament  hypertrophy. Spinal stenosis most prominent C2-3 follow-up by C5-6 and C6-7 level. If there is any clinical suspicion of ligamentous injury or cord injury, MR can be obtained for further delineation.  Moderate size right-sided pleural effusion incompletely assessed on present exam.  Heterogeneous thyroid gland.  IMPRESSION: CT HEAD:  No skull fracture or intracranial hemorrhage.  CT CERVICAL SPINE:  No cervical spine fracture or malalignment. No abnormal prevertebral soft tissue swelling.  Cervical spondylotic changes including prominent transverse ligament hypertrophy. Spinal stenosis most prominent C2-3 follow-up by C5-6 and C6-7 level. If there is any clinical suspicion of ligamentous injury or cord injury, MR can be obtained for further delineation.  Moderate size right-sided pleural effusion incompletely assessed on present exam.   Electronically Signed   By: Bridgett Larsson M.D.   On: 10/17/2013 09:32   Ct Cervical Spine Wo Contrast  10/17/2013   CLINICAL DATA:  Fall.  EXAM: CT HEAD WITHOUT CONTRAST  CT CERVICAL SPINE WITHOUT CONTRAST  TECHNIQUE: Multidetector CT imaging of the head and cervical spine was performed following the standard protocol without intravenous contrast. Multiplanar CT image reconstructions of the cervical spine were also generated.   COMPARISON:  09/04/2012.  FINDINGS: CT HEAD FINDINGS  No skull fracture or intracranial hemorrhage.  Small vessel disease type changes without CT evidence of large acute infarct.  No intracranial mass lesion noted on this unenhanced exam.  Vascular calcifications.  CT CERVICAL SPINE FINDINGS  No cervical spine fracture or malalignment. No abnormal prevertebral soft tissue swelling.  Cervical spondylotic changes including prominent transverse ligament hypertrophy. Spinal stenosis most prominent C2-3 follow-up by C5-6 and C6-7 level. If there is any clinical suspicion of ligamentous injury or cord injury, MR can be obtained for further delineation.  Moderate size right-sided pleural effusion incompletely assessed on present exam.  Heterogeneous thyroid gland.  IMPRESSION: CT HEAD:  No skull fracture or intracranial hemorrhage.  CT CERVICAL SPINE:  No cervical spine fracture or malalignment. No abnormal prevertebral soft tissue swelling.  Cervical spondylotic changes including prominent transverse ligament hypertrophy. Spinal stenosis most prominent C2-3 follow-up by C5-6 and C6-7 level. If there is any clinical suspicion of ligamentous injury or cord injury, MR can be obtained for further delineation.  Moderate size right-sided pleural effusion incompletely assessed on present exam.   Electronically Signed   By: Bridgett Larsson M.D.   On: 10/17/2013 09:32   Ct Pelvis Wo Contrast  10/17/2013   CLINICAL DATA:  Left hip fracture.  EXAM: CT PELVIS WITHOUT CONTRAST  TECHNIQUE: Multidetector CT imaging of the pelvis was performed following the standard protocol without intravenous contrast.  COMPARISON:  Plain films same day as well as 02/22/2013.  FINDINGS: Patient's right total hip arthroplasty is intact and normally located. This causes moderate streak artifact over the pelvis on the axial images. There is a displaced, minimally comminuted intertrochanteric fracture of the left hip with exaggerated coxa vara deformity  about the fracture site. No evidence of hip dislocation. There is mild edematous change within the left gluteal muscles versus atrophy of the right gluteal muscles. . Mild degenerative change of the left hip. There are degenerative changes of the spine with mild grade 1 anterolisthesis of L4 on L5 unchanged.  There is diverticulosis of the colon. Is minimal calcified plaque over the abdominal aorta and iliac vessels.  IMPRESSION: Displaced slightly comminuted left intertrochanteric fracture.   Electronically Signed   By: Elberta Fortis M.D.   On: 10/17/2013 11:55   Dg Hip Portable 1 View Right  10/20/2013   CLINICAL DATA:  Status post recent left hip arthroplasty. Decreased mobility and increased pain  EXAM: PORTABLE RIGHT HIP - 1 VIEW  COMPARISON:  02/22/2013  FINDINGS: Right hip prosthesis is well-seated and aligned and stable appearance from the prior study. No acute fracture. The bones are demineralized. Soft tissues are unremarkable.  IMPRESSION: No fracture or evidence of prosthesis loosening or malalignment.   Electronically Signed   By: Amie Portland M.D.   On: 10/20/2013 14:39   Dg Hip Portable 1 View Left  10/20/2013   CLINICAL DATA:  Recent left hip surgery. Decreased No bili and increased pain.  EXAM: PORTABLE LEFT HIP - 1 VIEW  COMPARISON:  10/17/2013  FINDINGS: The short intra medullary rod supports a compression screw reducing the basilar left femoral neck fracture in to near-anatomic alignment. The fracture alignment is stable from the operative views. There is no evidence of loosening of the orthopedic hardware. There is no new fracture.  There is overlying soft tissues edema as well as skin staples.  IMPRESSION: 1. No evidence of a new fracture. No fracture malalignment. Orthopedic hardware is stable in well seated.   Electronically Signed   By: Amie Portland M.D.   On: 10/20/2013 14:40   Dg Knee Complete 4 Views Left  10/17/2013   CLINICAL DATA:  Left hip fracture post fall  EXAM: LEFT KNEE  - COMPLETE 4+ VIEW  COMPARISON:  None.  FINDINGS: Osseous demineralization.  Tricompartmental joint space narrowing and minimal spur formation greatest at patellofemoral joint.  Minimal knee joint effusion.  Small calcified loose bodies at suprapatellar recess.  Scattered atherosclerotic calcification.  No acute fracture, dislocation, or bone destruction.  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  Small knee joint effusion with calcified loose bodies at suprapatellar recess.   Electronically Signed   By: Ulyses Southward M.D.   On: 10/17/2013 09:08   US Thoracentesis Asp Pleural Space W/img Guide  10/18/2013   CLINICAL DATA:  Diastolic heart failure, right pleural effusion. Request is made for diagnostic and therapeutic right thoracentesis.  EXAM: ULTRASOUND GUIDED DIAGNOSTIC AND THERAPEUTIC RIGHT THORACENTESIS  COMPARISON:  None.  FINDINGS: A total of approximately 1.2 liters of yellow fluid was removed. The fluid sample wassent for laboratory analysis.  IMPRESSION: Successful ultrasound guided diagnostic and therapeutic right thoracentesis yielding 1.2 liters of pleural fluid.  Read by: Jeananne Rama ,P.A.-C.  PROCEDURE: An ultrasound guided thoracentesis was thoroughly discussed with the patient and questions answered. The benefits, risks, alternatives and complications were also discussed. The patient understands and wishes to proceed with the procedure. Written consent was obtained.  Ultrasound was performed to localize and mark an adequate pocket of fluid in the right chest. The area was then prepped and draped in the normal sterile fashion. 1% Lidocaine was used for local anesthesia. Under ultrasound guidance a 19 gauge Yueh catheter was introduced. Thoracentesis was performed. The catheter was removed and a dressing applied.  Complications:  none   Electronically Signed   By: Simonne Come M.D.   On: 10/18/2013 17:01    Microbiology: Recent Results (from the past 240  hour(s))  BODY FLUID CULTURE     Status: None   Collection Time    10/18/13  3:29 PM      Result Value Ref Range Status   Specimen Description PLEURAL RT   Final   Special Requests NONE   Final   Gram Stain     Final   Value: RARE WBC PRESENT,  PREDOMINANTLY MONONUCLEAR     NO ORGANISMS SEEN     Performed at [facility]   Culture     Final   Value: NO GROWTH 2 DAYS     Performed at [facility]   Report Status PENDING   Incomplete     Labs: Basic Metabolic Panel:  Recent Labs Lab 10/17/13 0950 10/18/13 0356 10/19/13 0455 10/20/13 0450  NA 141 140 137 138  K 4.1 5.6* 4.6 4.2  CL 109 108 104 104  CO2 20 21 20 22   GLUCOSE 99 150* 98 108*  BUN 33* 31* 32* 26*  CREATININE 1.37* 1.29 1.25 1.15  CALCIUM 9.1 8.4 8.3* 8.2*  MG  --  2.0  --   --   PHOS  --  4.6  --   --    Liver Function Tests:  Recent Labs Lab 10/18/13 0356  AST 21  ALT 12  ALKPHOS 91  BILITOT 0.6  PROT 5.0*  ALBUMIN 2.7*   CBC:  Recent Labs Lab 10/17/13 0950 10/18/13 0356 10/19/13 0455 10/20/13 0450  WBC 6.5 4.8 9.0 6.5  NEUTROABS 5.3  --   --   --   HGB 11.9* 10.3* 10.3* 10.4*  HCT 36.1* 31.3* 31.3* 30.7*  MCV 92.8 94.0 93.2 92.2  PLT 118* 92* 106* 118*   BNP: BNP (last 3 results)  Recent Labs  11/14/12 0800 11/20/12 1954 10/17/13 0950  PROBNP 1488.0* 2537.0* 214.5   Signed:  Pamella Pert  [facility] 10/21/2013, 11:03 AM

## 2013-10-22 LAB — BODY FLUID CULTURE: Culture: NO GROWTH

## 2013-10-25 ENCOUNTER — Encounter: Payer: Self-pay | Admitting: *Deleted

## 2013-10-31 ENCOUNTER — Non-Acute Institutional Stay (SKILLED_NURSING_FACILITY): Payer: Medicare Other | Admitting: Internal Medicine

## 2013-10-31 ENCOUNTER — Encounter: Payer: Self-pay | Admitting: Internal Medicine

## 2013-10-31 DIAGNOSIS — N183 Chronic kidney disease, stage 3 unspecified: Secondary | ICD-10-CM

## 2013-10-31 DIAGNOSIS — S72009A Fracture of unspecified part of neck of unspecified femur, initial encounter for closed fracture: Secondary | ICD-10-CM

## 2013-10-31 DIAGNOSIS — I509 Heart failure, unspecified: Secondary | ICD-10-CM

## 2013-10-31 DIAGNOSIS — M109 Gout, unspecified: Secondary | ICD-10-CM

## 2013-10-31 DIAGNOSIS — I10 Essential (primary) hypertension: Secondary | ICD-10-CM

## 2013-10-31 DIAGNOSIS — I5030 Unspecified diastolic (congestive) heart failure: Secondary | ICD-10-CM

## 2013-10-31 DIAGNOSIS — N400 Enlarged prostate without lower urinary tract symptoms: Secondary | ICD-10-CM

## 2013-10-31 DIAGNOSIS — J90 Pleural effusion, not elsewhere classified: Secondary | ICD-10-CM

## 2013-10-31 NOTE — Assessment & Plan Note (Signed)
Continue flomax  

## 2013-10-31 NOTE — Assessment & Plan Note (Signed)
Cr 1.15 to 1.37 this hospitalization

## 2013-10-31 NOTE — Assessment & Plan Note (Signed)
S/p repair; admitted for OT/PT;prohylaxed with Lovenox

## 2013-10-31 NOTE — Progress Notes (Signed)
MRN: [id number] Name: [name]  Sex: male Age: 80 y.o. DOB: 14-Jan-1925  [facility] Facility/Room:1203 Level Of Care: SNF Provider: Merrilee SeashoreALEXANDER, Ivianna Notch D Emergency Contacts: Extended Emergency Contact Information Primary Emergency Contact: Limb,Barbara Address: [address]          [place], [place] [address] [place] Home Phone: [phone] Mobile Phone: [phone] Relation: Spouse Secondary Emergency Contact: Wilborne,[name] Address: [address]          [place], [place] [address] [place] Home Phone: [phone] Work Phone: [phone] Mobile Phone: [phone] Relation: Daughter  Code Status: DNR  Allergies: Nsaids  Chief Complaint  Patient presents with  . nursing home admission    HPI: Patient is 80 y.o. male who sustained a femoral neck fx, repaired and ius here for OT/PT.  Past Medical History  Diagnosis Date  . Gout   . Insomnia   . BPH (benign prostatic hyperplasia)   . Kidney disorder   . Arthritis   . Hypertension     patient denies on 10/25/12 on no meds   . Complication of anesthesia     hard to wake up -2011 after hernia surgery   . Anemia   . CHF (congestive heart failure)     Past Surgical History  Procedure Laterality Date  . Cataract extraction    . Cholecystectomy    . Hernia repair  02/22/11    right  . Joint replacement      fractured hip  . Total hip arthroplasty Right 10/29/2012    Procedure: CONVERSION OF PREVIOUS HIP SURGERY TO RIGHT TOTAL HIP AND REMOVAL OF IM NAIL ;  Surgeon: Shelda PalMatthew D Olin, MD;  Location: [facility];  Service: Orthopedics;  Laterality: Right;  . Esophagogastroduodenoscopy N/A 11/16/2012    Procedure: ESOPHAGOGASTRODUODENOSCOPY (EGD);  Surgeon: Theda BelfastPatrick D Hung, MD;  [facility];  Service: Endoscopy;  Laterality: N/A;  . Intramedullary (im) nail intertrochanteric Left 10/17/2013    Procedure: INTRAMEDULLARY (IM) NAIL INTERTROCHANTRIC;  Surgeon: Shelda PalMatthew D Olin,  MD;  Location: [facility];  Service: Orthopedics;  Laterality: Left;      Medication List       This list is accurate as of: 10/31/13 12:47 PM.  Always use your most recent med list.               allopurinol 100 MG tablet  Commonly known as:  ZYLOPRIM  Take 100 mg by mouth daily. For gout.     enoxaparin 40 MG/0.4ML injection  Commonly known as:  LOVENOX  Inject 40 mg into the skin daily. For DVT, Prophylaxis     HYDROcodone-acetaminophen 5-325 MG per tablet  Commonly known as:  NORCO  Take one to two tablets by mouth every 6 hours as needed for pain     levofloxacin 500 MG tablet  Commonly known as:  LEVAQUIN  Take 500 mg by mouth daily. For atelectasis/effusion     naphazoline-glycerin 0.012-0.2 % Soln  Commonly known as:  CLEAR EYES  Place 1-2 drops into both eyes every 4 (four) hours as needed for irritation.     potassium chloride 10 MEQ tablet  Commonly known as:  K-DUR,KLOR-CON  Take 10 mEq by mouth daily. For potassium supplement     tamsulosin 0.4 MG Caps capsule  Commonly known as:  FLOMAX  Take 0.4 mg by mouth 2 (two) times daily. BPH        No orders of the defined types were placed in this  encounter.    Immunization History  Administered Date(s) Administered  . Influenza Whole 05/29/2007, 05/15/2008, 05/14/2009  . PPD Test 10/21/2013  . Pneumococcal Polysaccharide-23 08/22/2004, 11/26/2007, 05/28/2008  . Tdap 12/15/2011  . Zoster 11/26/2007    History  Substance Use Topics  . Smoking status: Never Smoker   . Smokeless tobacco: Never Used  . Alcohol Use: No    Family history is noncontributory    Review of Systems  DATA OBTAINED: from patient, nurse, medical record, family member GENERAL: Feels well no fevers, fatigue, appetite changes SKIN: No itching, rash  EYES: No eye pain, redness, discharge EARS: No earache, tinnitus, change in hearing NOSE: No congestion, drainage or bleeding  MOUTH/THROAT: No mouth or tooth pain, No sore throat,  No difficulty chewing or swallowing  RESPIRATORY: No cough, wheezing, SOB CARDIAC: No chest pain, palpitations, lower extremity edema  GI: No abdominal pain, No N/V/D or constipation, No heartburn or reflux  GU: No dysuria, frequency or urgency, or incontinence  MUSCULOSKELETAL: mild L groin pain after PT NEUROLOGIC: No headache, dizziness or focal weakness PSYCHIATRIC: No overt anxiety or sadness. Sleeps well. No behavior issue.   Filed Vitals:   10/31/13 1237  BP: 128/56  Pulse: 98  Temp: 98.6 F (37 C)  Resp: 18    Physical Exam  GENERAL APPEARANCE: Alert, conversant. Appropriately groomed. No acute distress.  SKIN: No diaphoresis rash, or wounds HEAD: Normocephalic, atraumatic  EYES: Conjunctiva/lids clear. Pupils round, reactive. EOMs intact.  EARS: External exam WNL, canals clear. Hearing grossly normal.  NOSE: No deformity or discharge.  MOUTH/THROAT: Lips w/o lesions.   RESPIRATORY: Breathing is even, unlabored. Lung sounds are clear   CARDIOVASCULAR: Heart RRR no murmurs, rubs or gallops. No peripheral edema.   GASTROINTESTINAL: Abdomen is soft, non-tender, not distended w/ normal bowel sounds. GENITOURINARY: Bladder non tender, not distended  MUSCULOSKELETAL: No abnormal joints or musculature NEUROLOGIC: Oriented X3. Cranial nerves 2-12 grossly intact. Moves all extremities no tremor. PSYCHIATRIC: Mood and affect appropriate to situation, no behavioral issues  Patient Active Problem List   Diagnosis Date Noted  . Pleural effusion 10/18/2013  . Femoral neck fracture 10/17/2013  . Hip fracture 10/17/2013  . CKD (chronic kidney disease) stage 3, GFR 30-59 ml/min 10/17/2013  . Osteoarthritis of left knee 09/26/2013  . Constipation 03/28/2013  . GERD (gastroesophageal reflux disease) 03/28/2013  . Diastolic CHF 03/28/2013  . Weakness generalized 02/06/2013  . Diastolic CHF, acute 11/23/2012  . Atypical chest pain 11/20/2012  . Volume overload 11/20/2012  .  Hypovolemic shock 11/19/2012  . Obstructive uropathy 11/17/2012  . Hypokalemia 11/16/2012  . Uremic encephalopathy 11/15/2012  . ARF (acute renal failure) 11/14/2012  . Hyperkalemia 11/13/2012  . Melena 11/13/2012  . Anemia 11/13/2012  . Leukocytosis 11/13/2012  . Hyponatremia 11/13/2012  . Expected blood loss anemia 10/30/2012  . S/P right TH revision 10/29/2012  . BENIGN PROSTATIC HYPERTROPHY 03/12/2007  . GOUT 03/02/2007  . INSOMNIA, PERSISTENT 03/02/2007  . HYPERTENSION 03/02/2007    CBC    Component Value Date/Time   WBC 6.5 10/20/2013 0450   RBC 3.33* 10/20/2013 0450   HGB 10.4* 10/20/2013 0450   HCT 30.7* 10/20/2013 0450   PLT 118* 10/20/2013 0450   MCV 92.2 10/20/2013 0450   LYMPHSABS 0.7 10/17/2013 0950   MONOABS 0.5 10/17/2013 0950   EOSABS 0.0 10/17/2013 0950   BASOSABS 0.0 10/17/2013 0950    CMP     Component Value Date/Time   NA 138 10/20/2013 0450   K  4.2 10/20/2013 0450   CL 104 10/20/2013 0450   CO2 22 10/20/2013 0450   GLUCOSE 108* 10/20/2013 0450   GLUCOSE 84 08/28/2006 1429   BUN 26* 10/20/2013 0450   CREATININE 1.15 10/20/2013 0450   CALCIUM 8.2* 10/20/2013 0450   PROT 5.0* 10/18/2013 0356   ALBUMIN 2.7* 10/18/2013 0356   AST 21 10/18/2013 0356   ALT 12 10/18/2013 0356   ALKPHOS 91 10/18/2013 0356   BILITOT 0.6 10/18/2013 0356   GFRNONAA 56* 10/20/2013 0450   GFRAA 65* 10/20/2013 0450    Assessment and Plan  Femoral neck fracture S/p repair; admitted for OT/PT;prohylaxed with Lovenox  Diastolic CHF Stable at this time   Pleural effusion R side;felt to have a chronic component , not CHF; being treated with levaquin for 10 days for empiric PNA; fluid was tapped-exudate? Light ctieria, results pending  HYPERTENSION Controlled on no meds  BENIGN PROSTATIC HYPERTROPHY  Continue flomax  CKD (chronic kidney disease) stage 3, GFR 30-59 ml/min Cr 1.15 to 1.37 this hospitalization  GOUT Continue allopurinol    Margit Hanks, MD

## 2013-10-31 NOTE — Assessment & Plan Note (Signed)
R side;felt to have a chronic component , not CHF; being treated with levaquin for 10 days for empiric PNA; fluid was tapped-exudate? Light ctieria, results pending

## 2013-10-31 NOTE — Assessment & Plan Note (Signed)
Continue allopurinol 

## 2013-10-31 NOTE — Assessment & Plan Note (Signed)
Stable at this time 

## 2013-10-31 NOTE — Assessment & Plan Note (Signed)
Controlled on no meds

## 2013-11-07 ENCOUNTER — Encounter: Payer: Self-pay | Admitting: Adult Health

## 2013-11-07 ENCOUNTER — Non-Acute Institutional Stay (SKILLED_NURSING_FACILITY): Payer: Medicare Other | Admitting: Adult Health

## 2013-11-07 DIAGNOSIS — N183 Chronic kidney disease, stage 3 unspecified: Secondary | ICD-10-CM

## 2013-11-07 DIAGNOSIS — S72009A Fracture of unspecified part of neck of unspecified femur, initial encounter for closed fracture: Secondary | ICD-10-CM

## 2013-11-07 DIAGNOSIS — I10 Essential (primary) hypertension: Secondary | ICD-10-CM

## 2013-11-07 DIAGNOSIS — M109 Gout, unspecified: Secondary | ICD-10-CM

## 2013-11-07 DIAGNOSIS — I509 Heart failure, unspecified: Secondary | ICD-10-CM

## 2013-11-07 DIAGNOSIS — N400 Enlarged prostate without lower urinary tract symptoms: Secondary | ICD-10-CM

## 2013-11-07 DIAGNOSIS — I5030 Unspecified diastolic (congestive) heart failure: Secondary | ICD-10-CM

## 2013-11-07 NOTE — Progress Notes (Signed)
[name], male   DOB: 01-30-1925, 80 y.o.   MRN: [id number]              PROGRESS NOTE  DATE: 11/07/2013   FACILITY: [facility]  LEVEL OF CARE: SNF (31)  Acute Visit  CHIEF COMPLAINT:  Discharge Notes  HISTORY OF PRESENT ILLNESS: This is an 80 year old male who is for discharge home with Home health PT, OT and Nursing. He has been admitted to [facility] on 10/21/13 from [facility] with Left intertrochanteric hip fracture S/P repair - IM Nail Left intertrochanteric. Patient was admitted to this facility for short-term rehabilitation after the patient's recent hospitalization.  Patient has completed SNF rehabilitation and therapy has cleared the patient for discharge.  Reassessment of ongoing problem(s):  HTN: Pt 's HTN remains stable.  Denies CP, sob, DOE, pedal edema, headaches, dizziness or visual disturbances.  No complications from the medications currently being used.  Last BP : 131/68  GOUT: Patien'st  gout remains stable. Patient denies joint pan, redness, swelling or warmth. No complications reported from the medications presently being used.  BPH: The patient's BPH remains stable. Patient has foley catheter. No complications reported from the current medications being used.  PAST MEDICAL HISTORY : Reviewed.  No changes.  CURRENT MEDICATIONS: Reviewed per [facility]  REVIEW OF SYSTEMS:  GENERAL: no change in appetite, no fatigue, no weight changes, no fever, chills or weakness RESPIRATORY: no cough, SOB, DOE, wheezing, hemoptysis CARDIAC: no chest pain, edema or palpitations GI: no abdominal pain, diarrhea, constipation, heart burn, nausea or vomiting  PHYSICAL EXAMINATION  GENERAL: no acute distress, normal body habitus EYES: conjunctivae normal, sclerae normal, normal eye lids NECK: supple, trachea midline, no neck masses, no thyroid tenderness, no thyromegaly LYMPHATICS: no LAN in the neck, no supraclavicular LAN RESPIRATORY:  breathing is even & unlabored, BS CTAB CARDIAC: RRR, no murmur,no extra heart sounds, no edema GI: abdomen soft, normal BS, no masses, no tenderness, no hepatomegaly, no splenomegaly GU: foley catheter intact, draining with yellowish urine EXTREMITIES: able to move all 4 extremities PSYCHIATRIC: the patient is alert & oriented to person, affect & behavior appropriate  LABS/RADIOLOGY: Labs reviewed: Basic Metabolic Panel:  Recent Labs  40/98/11 0534 11/16/12 2218  10/17/13 0950 10/18/13 0356 10/19/13 0455 10/20/13 0450  NA 140  --   < > 141 140 137 138  K 3.3*  --   < > 4.1 5.6* 4.6 4.2  CL 111  --   < > 109 108 104 104  CO2 18*  --   < > 20 21 20 22   GLUCOSE 97  --   < > 99 150* 98 108*  BUN 38*  --   < > 33* 31* 32* 26*  CREATININE 2.14*  --   < > 1.37* 1.29 1.25 1.15  CALCIUM 7.9*  --   < > 9.1 8.4 8.3* 8.2*  MG  --  1.6  --   --  2.0  --   --   PHOS  --   --   --   --  4.6  --   --   < > = values in this interval not displayed. Liver Function Tests:  Recent Labs  03/17/13 1135 09/26/13 1200 10/18/13 0356  AST 18 38* 21  ALT 11 36 12  ALKPHOS 139* 129* 91  BILITOT 1.0 1.5* 0.6  PROT 6.3 7.2 5.0*  ALBUMIN 3.4* 3.6 2.7*   CBC:  Recent Labs  03/17/13 1135 09/26/13 1200 10/17/13 0950 10/18/13 0356 10/19/13 0455 10/20/13 0450  WBC 6.6 15.2* 6.5 4.8 9.0 6.5  NEUTROABS 5.4 13.6* 5.3  --   --   --   HGB 12.1* 13.9 11.9* 10.3* 10.3* 10.4*  HCT 36.3* 42.1 36.1* 31.3* 31.3* 30.7*  MCV 91.2 94.5 92.8 94.0 93.2 92.2  PLT 141* 182.0 118* 92* 106* 118*   Cardiac Enzymes:  Recent Labs  11/21/12 0325 11/21/12 0940 11/21/12 1649  TROPONINI <0.30 <0.30 <0.30   CBG:  Recent Labs  11/15/12 0821  GLUCAP 101*    ASSESSMENT/PLAN:  Left intertrochanteric hip fracture status post repair - for home health PT, OT and nursing Hypertension - well controlled BPH - foley catheter Diastolic heart failure - stable CKD, stage 3 - stable Gout - continue  Allopurinol    I have filled out patient's discharge paperwork and written prescriptions.  Patient will receive home health PT, OT and Nursing.   Total discharge time: Less than 30 minutes Discharge time involved coordination of the discharge process with Child psychotherapistsocial worker, nursing staff and therapy department. Medical justification for home health services/DME verified.  CPT CODE: [id number]  Ella BodoMonina Vargas - NP [facility] [phone]

## 2013-11-20 ENCOUNTER — Encounter: Payer: Self-pay | Admitting: Internal Medicine

## 2013-11-20 ENCOUNTER — Ambulatory Visit (INDEPENDENT_AMBULATORY_CARE_PROVIDER_SITE_OTHER)
Admission: RE | Admit: 2013-11-20 | Discharge: 2013-11-20 | Disposition: A | Discharge status: Home / Self Care | Payer: Medicare Other | Source: Ambulatory Visit | Attending: Internal Medicine | Admitting: Internal Medicine

## 2013-11-20 ENCOUNTER — Ambulatory Visit (INDEPENDENT_AMBULATORY_CARE_PROVIDER_SITE_OTHER): Payer: Medicare Other | Admitting: Internal Medicine

## 2013-11-20 ENCOUNTER — Telehealth: Payer: Self-pay | Admitting: Internal Medicine

## 2013-11-20 VITALS — BP 116/64 | HR 99 | Temp 98.3°F | Resp 18 | Ht 66.0 in | Wt 126.0 lb

## 2013-11-20 DIAGNOSIS — D649 Anemia, unspecified: Secondary | ICD-10-CM

## 2013-11-20 DIAGNOSIS — S72009A Fracture of unspecified part of neck of unspecified femur, initial encounter for closed fracture: Secondary | ICD-10-CM

## 2013-11-20 DIAGNOSIS — J90 Pleural effusion, not elsewhere classified: Secondary | ICD-10-CM

## 2013-11-20 DIAGNOSIS — N139 Obstructive and reflux uropathy, unspecified: Secondary | ICD-10-CM

## 2013-11-20 DIAGNOSIS — I10 Essential (primary) hypertension: Secondary | ICD-10-CM

## 2013-11-20 NOTE — Patient Instructions (Signed)
Limit your sodium (Salt) intake  Return in 3 months for follow-up   

## 2013-11-20 NOTE — Progress Notes (Signed)
Pre-visit discussion using our clinic review tool. No additional management support is needed unless otherwise documented below in the visit note.  

## 2013-11-20 NOTE — Telephone Encounter (Signed)
Pt was seen this morning, pt is wanting to know if his xray results are back.

## 2013-11-20 NOTE — Progress Notes (Signed)
Subjective:    [name], male    DOB: 1924/11/08, 80 y.o.   MRN: [id number]  HPI  80 year old patient who is seen today following a recent hospital discharge.  He fell, sustaining a left hip fracture.  He is status post pinning and is followed by orthopedics.  He was discharged and spent [facility] and has been home for about 2 weeks.  He continues to do well Hospital records reviewed Hospital course concha by a left pleural effusion.  The patient did have a diagnostic thoracentesis. Since his discharge he has done well.  He continues to receive home PT and OT.  He is making nice progress. He is scheduled to see urology soon.  He continues to have a chronic Foley catheter due to obstructive uropathy.  He remains on Flomax.  Repeat CXR  Follow up on cytology results  Discharge Diagnoses:  Active Problems:  HYPERTENSION  BENIGN PROSTATIC HYPERTROPHY  Diastolic CHF  Femoral neck fracture  Hip fracture  CKD (chronic kidney disease) stage 3, GFR 30-59 ml/min  Pleural effusion  Discharge Condition: stable  Diet recommendation: heart healthy   Cytology reviewed.  No malignant cells identified  Past Medical History  Diagnosis Date  . Gout   . Insomnia   . BPH (benign prostatic hyperplasia)   . Kidney disorder   . Arthritis   . Hypertension     patient denies on 10/25/12 on no meds   . Complication of anesthesia     hard to wake up -2011 after hernia surgery   . Anemia   . CHF (congestive heart failure)     History   Social History  . Marital Status: Married    Spouse Name: N/A    Number of Children: N/A  . Years of Education: N/A   Occupational History  . Not on file.   Social History Main Topics  . Smoking status: Never Smoker   . Smokeless tobacco: Never Used  . Alcohol Use: No  . Drug Use: No  . Sexual Activity: No   Other Topics Concern  . Not on file   Social History Narrative  . No narrative on file    Past Surgical  History  Procedure Laterality Date  . Cataract extraction    . Cholecystectomy    . Hernia repair  02/22/11    right  . Joint replacement      fractured hip  . Total hip arthroplasty Right 10/29/2012    Procedure: CONVERSION OF PREVIOUS HIP SURGERY TO RIGHT TOTAL HIP AND REMOVAL OF IM NAIL ;  Surgeon: Shelda Pal, MD;  Location: [facility];  Service: Orthopedics;  Laterality: Right;  . Esophagogastroduodenoscopy N/A 11/16/2012    Procedure: ESOPHAGOGASTRODUODENOSCOPY (EGD);  Surgeon: Theda Belfast, MD;  [facility];  Service: Endoscopy;  Laterality: N/A;  . Intramedullary (im) nail intertrochanteric Left 10/17/2013    Procedure: INTRAMEDULLARY (IM) NAIL INTERTROCHANTRIC;  Surgeon: Shelda Pal, MD;  Location: [facility];  Service: Orthopedics;  Laterality: Left;    Family History  Problem Relation Age of Onset  . Heart failure Mother   . Heart failure Father   . Diabetes Neg Hx     Allergies  Allergen Reactions  . Nsaids Other (See Comments)    history of renal failure    Current Outpatient Prescriptions on File Prior to Visit  Medication Sig Dispense Refill  . allopurinol (ZYLOPRIM) 100 MG tablet Take 100 mg by mouth  daily. For gout.      . enoxaparin (LOVENOX) 40 MG/0.4ML injection Inject 40 mg into the skin daily. For DVT, Prophylaxis      . naphazoline-glycerin (CLEAR EYES) 0.012-0.2 % SOLN Place 1-2 drops into both eyes every 4 (four) hours as needed for irritation.      . tamsulosin (FLOMAX) 0.4 MG CAPS Take 0.4 mg by mouth 2 (two) times daily. BPH      . HYDROcodone-acetaminophen (NORCO) 5-325 MG per tablet Take one to two tablets by mouth every 6 hours as needed for pain  240 tablet  0   No current facility-administered medications on file prior to visit.    BP 116/64  Pulse 99  Temp(Src) 98.3 F (36.8 C) (Oral)  Resp 18  Ht 5\' 6"  (1.676 m)  Wt 126 lb (57.153 kg)  BMI 20.35 kg/m2  SpO2 98%     Review of Systems  Constitutional: Negative for fever,  chills, appetite change and fatigue.  HENT: Negative for congestion, dental problem, ear pain, hearing loss, sore throat, tinnitus, trouble swallowing and voice change.   Eyes: Negative for pain, discharge and visual disturbance.  Respiratory: Negative for cough, chest tightness, wheezing and stridor.   Cardiovascular: Negative for chest pain, palpitations and leg swelling.  Gastrointestinal: Negative for nausea, vomiting, abdominal pain, diarrhea, constipation, blood in stool and abdominal distention.  Genitourinary: Negative for urgency, hematuria, flank pain, discharge, difficulty urinating and genital sores.  Musculoskeletal: Positive for gait problem. Negative for arthralgias, back pain, joint swelling, myalgias and neck stiffness.       Walks with a 4. walker  Skin: Negative for rash.  Neurological: Negative for dizziness, syncope, speech difficulty, weakness, numbness and headaches.  Hematological: Negative for adenopathy. Does not bruise/bleed easily.  Psychiatric/Behavioral: Negative for behavioral problems and dysphoric mood. The patient is not nervous/anxious.        Objective:   Physical Exam  Constitutional: He is oriented to person, place, and time. He appears well-developed.    Blood pressure low normal Walks with a 4. Walker Elderly No distress Blood pressure low normal  HENT:  Head: Normocephalic.  Right Ear: External ear normal.  Left Ear: External ear normal.  Eyes: Conjunctivae and EOM are normal.  Neck: Normal range of motion.  Cardiovascular: Normal rate and normal heart sounds.   Pulmonary/Chest: Effort normal and breath sounds normal.  Slight decreased breath sounds at both bases  O2 saturation 98  Abdominal: Bowel sounds are normal.  Musculoskeletal: Normal range of motion. He exhibits no edema and no tenderness.  Neurological: He is alert and oriented to person, place, and time.  Psychiatric: He has a normal mood and affect. His behavior is normal.           Assessment & Plan:   Status post left hip fracture.  Stable.  Continue home PT and OT History left pleural effusion.  Cytology negative.  We'll check a followup chest x-ray History of diastolic heart failure, stable BPH.  Followup urology hopeful to DC Foley catheter  Recheck 3 months

## 2013-11-21 NOTE — Telephone Encounter (Signed)
Noted  

## 2013-11-21 NOTE — Telephone Encounter (Signed)
Called and discussed.  Patient has new right-sided pleural effusion.  Will reassess in 4 weeks

## 2013-12-05 ENCOUNTER — Telehealth: Payer: Self-pay | Admitting: Internal Medicine

## 2013-12-05 ENCOUNTER — Ambulatory Visit: Payer: Self-pay | Admitting: Internal Medicine

## 2013-12-05 NOTE — Telephone Encounter (Signed)
Ok to extend

## 2013-12-05 NOTE — Telephone Encounter (Signed)
Verlon AuLeslie from [facility] needs Verbal order to extend pt's visit order for at least four (4) more weeks. Original order ends today. Order ends today. Pt continues to have elevated heart reate average 104  bp 100/58 pls call

## 2013-12-05 NOTE — Telephone Encounter (Signed)
Please advise if okay?

## 2013-12-06 NOTE — Telephone Encounter (Signed)
Left detailed message for Verlon AuLeslie, okay to extend pt's visits x 4 weeks per Dr. Kirtland BouchardK. Call if any questions.

## 2013-12-10 ENCOUNTER — Telehealth: Payer: Self-pay | Admitting: Internal Medicine

## 2013-12-10 NOTE — Telephone Encounter (Signed)
Patient Information:  Caller Name: [name]  Phone: [phone]  Patient: [name], [name]  Gender: Male  DOB: [birth year]  Age: 80 Years  PCP: Eleonore ChiquitoKwiatkowski, Peter (Family Practice > 1963yrs old)  Office Follow Up:  Does the office need to follow up with this patient?: No  Instructions For The Office: N/A  RN Note:  [facility]currently 5:39 pm)  Symptoms  Reason For Call & Symptoms: Seen in office 11/20/2013 with CXR - fluid in Right lung - watching until Appt 5/6. Words sound different at intervals - slurred speech will last for awhile then go away - speech  worse in am & late at night.  Will take breath and say words as quickly as possible like going to run out of breath but tongue sounds thick to family,. Wheezing worse for 4-5 days,.   cold makes it worse.  Can hear wheeze across room  Reviewed Health History In EMR: Yes  Reviewed Medications In EMR: Yes  Reviewed Allergies In EMR: Yes  Reviewed Surgeries / Procedures: Yes  Date of Onset of Symptoms: 12/06/2013  Guideline(s) Used:  Neurologic Deficit  Breathing Difficulty  Disposition Per Guideline:   Go to ED Now  Reason For Disposition Reached:   Wheezing can be heard across the room  Advice Given:  General Care Advice for Breathing Difficulty:  Find position of greatest comfort. For most patients the best position is semi-upright (e.g., sitting up in a comfortable chair or lying back against pillows).  Elevate head of bed (e.g., use pillows or place blocks under bed).  Keep room temperature slightly on the cool side.  Limit activities or space activities apart during the day. Prioritize activities.  Call Back If:  You become worse.  Patient Will Follow Care Advice:  YES

## 2013-12-12 ENCOUNTER — Telehealth: Payer: Self-pay | Admitting: Internal Medicine

## 2013-12-12 ENCOUNTER — Ambulatory Visit (INDEPENDENT_AMBULATORY_CARE_PROVIDER_SITE_OTHER)
Admission: RE | Admit: 2013-12-12 | Discharge: 2013-12-12 | Disposition: A | Discharge status: Home / Self Care | Payer: Medicare Other | Source: Ambulatory Visit | Attending: Internal Medicine | Admitting: Internal Medicine

## 2013-12-12 DIAGNOSIS — J90 Pleural effusion, not elsewhere classified: Secondary | ICD-10-CM

## 2013-12-12 NOTE — Telephone Encounter (Signed)
Spoke to [place] told [facility] [name] verbalized understanding and asked if he can go this afternoon. Told [name] yes, do not need an appointment can just walk in. [name] verbalized understanding. Told her I will have someone call them to reschedule appointment for sometime next week. [name] verbalized understanding.

## 2013-12-12 NOTE — Telephone Encounter (Signed)
Please call pt's daughter [name] and reschedule pt's appointment for sometime next week.

## 2013-12-12 NOTE — Telephone Encounter (Signed)
Okay for a followup chest x-ray

## 2013-12-12 NOTE — Telephone Encounter (Signed)
Please see message and advise 

## 2013-12-12 NOTE — Telephone Encounter (Signed)
Patient Information:  Caller Name: [name]  Phone: [phone]  Patient: [name], [name]  Gender: Male  DOB: 1925/02/22  Age: 80 Years  PCP: Eleonore ChiquitoKwiatkowski, Peter (Family Practice > 4621yrs old)  Office Follow Up:  Does the office need to follow up with this patient?: Yes  Instructions For The Office: Daughter states he is fine. She does not feels like patient needs to be in the ER.  She is calling because Home Health Agency heard something. He is active walking and is not in distress.  They are requesting XRay and appt moved up from May 6th, 2015.  PLEASE CONTACT DAUGHTER.  RN Note:  Daughter states he is fine. She does not feels like patient needs to be in the ER.  She is calling because Home Health Agency heard something. He is active walking and is not in distress.  They are requesting XRay and appt moved up from May 6th, 2015.  PLEASE CONTACT DAUGHTER.  Symptoms  Reason For Call & Symptoms: Daughter states her father had fluid in right lung from xray in April.  He [facility] a fractured righ  hip since February. . The Home Health nurse states "she heard something in right lung".  Daughter has noted occasional intermittent wheezing and it has just become more pronounced in last week. She is requesting another Xray on her father.  He does appear to be short of breath but occasionally he has to pause to finish a long sentence .  Afebrile. No cough.  Reviewed Health History In EMR: Yes  Reviewed Medications In EMR: Yes  Reviewed Allergies In EMR: Yes  Reviewed Surgeries / Procedures: Yes  Date of Onset of Symptoms: 12/05/2013  Guideline(s) Used:  Breathing Difficulty  Disposition Per Guideline:   Go to ED Now  Reason For Disposition Reached:   Hip or leg fracture in past 2 months (e.g., or had cast on leg or ankle)  Advice Given:  General Care Advice for Breathing Difficulty:  Find position of greatest comfort. For most patients the best  position is semi-upright (e.g., sitting up in a comfortable chair or lying back against pillows).  Elevate head of bed (e.g., use pillows or place blocks under bed).  Avoid smoke or fume exposure.  Call Back If:  Severe difficulty breathing occurs  Fever more than 100.5 F (38.1 C)  You become worse.  RN Overrode Recommendation:  Document Patient  Daughter states he is fine. She does not feels like patient needs to be in the ER.  She is calling because Home [facility] Agency heard something. He is active walking and is not in distress.  They are requesting XRay and appt moved up from May 6th, 2015.  PLEASE CONTACT DAUGHTER.

## 2013-12-12 NOTE — Telephone Encounter (Signed)
Pt daughter sandy did not want to rsc cpx. Pt will see md next wk for follow up on chest xray

## 2013-12-19 ENCOUNTER — Encounter: Payer: Self-pay | Admitting: Internal Medicine

## 2013-12-19 ENCOUNTER — Ambulatory Visit (INDEPENDENT_AMBULATORY_CARE_PROVIDER_SITE_OTHER): Payer: Medicare Other | Admitting: Internal Medicine

## 2013-12-19 VITALS — BP 110/62 | HR 100 | Temp 97.9°F | Resp 20 | Ht 66.0 in | Wt 128.0 lb

## 2013-12-19 DIAGNOSIS — N139 Obstructive and reflux uropathy, unspecified: Secondary | ICD-10-CM

## 2013-12-19 DIAGNOSIS — N183 Chronic kidney disease, stage 3 unspecified: Secondary | ICD-10-CM

## 2013-12-19 DIAGNOSIS — R531 Weakness: Secondary | ICD-10-CM

## 2013-12-19 DIAGNOSIS — E8779 Other fluid overload: Secondary | ICD-10-CM

## 2013-12-19 DIAGNOSIS — E8770 Fluid overload, unspecified: Secondary | ICD-10-CM

## 2013-12-19 DIAGNOSIS — R5381 Other malaise: Secondary | ICD-10-CM

## 2013-12-19 DIAGNOSIS — I509 Heart failure, unspecified: Secondary | ICD-10-CM

## 2013-12-19 DIAGNOSIS — R5383 Other fatigue: Secondary | ICD-10-CM

## 2013-12-19 DIAGNOSIS — I5030 Unspecified diastolic (congestive) heart failure: Secondary | ICD-10-CM

## 2013-12-19 NOTE — Progress Notes (Signed)
Pre-visit discussion using our clinic review tool. No additional management support is needed unless otherwise documented below in the visit note.  

## 2013-12-19 NOTE — Progress Notes (Signed)
Subjective:    [name], male    DOB: 02-Nov-1924, 80 y.o.   MRN: [id number]  HPI Wt Readings from Last 3 Encounters:  12/19/13 128 lb (58.06 kg)  11/20/13 126 lb (57.153 kg)  11/07/13 128 lb 6.4 oz (58.62242 kg)   80 year old patient who is seen today in followup.  He has been followed by physical therapy, who were concerned about some shortness of breath.  The patient has a history of a moderate size right pleural effusion.  Chest x-ray revealed no change.  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  His Foley catheter has been removed.  There's been no significant weight gain.  The patient describes a very mild nocturnal wheezing but in general doing quite well  Past Medical History  Diagnosis Date  . Gout   . Insomnia   . BPH (benign prostatic hyperplasia)   . Kidney disorder   . Arthritis   . Hypertension     patient denies on 10/25/12 on no meds   . Complication of anesthesia     hard to wake up -2011 after hernia surgery   . Anemia   . CHF (congestive heart failure)     History   Social History  . Marital Status: Married    Spouse Name: N/A    Number of Children: N/A  . Years of Education: N/A   Occupational History  . Not on file.   Social History Main Topics  . Smoking status: Never Smoker   . Smokeless tobacco: Never Used  . Alcohol Use: No  . Drug Use: No  . Sexual Activity: No   Other Topics Concern  . Not on file   Social History Narrative  . No narrative on file    Past Surgical History  Procedure Laterality Date  . Cataract extraction    . Cholecystectomy    . Hernia repair  02/22/11    right  . Joint replacement      fractured hip  . Total hip arthroplasty Right 10/29/2012    Procedure: CONVERSION OF PREVIOUS HIP SURGERY TO RIGHT TOTAL HIP AND REMOVAL OF IM NAIL ;  Surgeon: Shelda PalMatthew D Olin, MD;  Location: [facility];  Service: Orthopedics;  Laterality: Right;  .  Esophagogastroduodenoscopy N/A 11/16/2012    Procedure: ESOPHAGOGASTRODUODENOSCOPY (EGD);  Surgeon: Theda BelfastPatrick D Hung, MD;  [facility];  Service: Endoscopy;  Laterality: N/A;  . Intramedullary (im) nail intertrochanteric Left 10/17/2013    Procedure: INTRAMEDULLARY (IM) NAIL INTERTROCHANTRIC;  Surgeon: Shelda PalMatthew D Olin, MD;  Location: [facility];  Service: Orthopedics;  Laterality: Left;    Family History  Problem Relation Age of Onset  . Heart failure Mother   . Heart failure Father   . Diabetes Neg Hx     Allergies  Allergen Reactions  . Nsaids Other (See Comments)    history of renal failure    Current Outpatient Prescriptions on File Prior to Visit  Medication Sig Dispense Refill  . allopurinol (ZYLOPRIM) 100 MG tablet Take 100 mg by mouth daily. For gout.      . naphazoline-glycerin (CLEAR EYES) 0.012-0.2 % SOLN Place 1-2 drops into both eyes every 4 (four) hours as needed for irritation.       No current facility-administered medications on file prior to visit.    BP 110/62  Pulse 100  Temp(Src) 97.9 F (36.6 C) (Oral)  Resp 20  Ht 5\' 6"  (  1.676 m)  Wt 128 lb (58.06 kg)  BMI 20.67 kg/m2  SpO2 97%     Review of Systems  Constitutional: Negative for fever, chills, appetite change and fatigue.  HENT: Negative for congestion, dental problem, ear pain, hearing loss, sore throat, tinnitus, trouble swallowing and voice change.   Eyes: Negative for pain, discharge and visual disturbance.  Respiratory: Positive for wheezing. Negative for cough, chest tightness and stridor.   Cardiovascular: Negative for chest pain, palpitations and leg swelling.  Gastrointestinal: Negative for nausea, vomiting, abdominal pain, diarrhea, constipation, blood in stool and abdominal distention.  Genitourinary: Negative for urgency, hematuria, flank pain, discharge, difficulty urinating and genital sores.  Musculoskeletal: Negative for arthralgias, back pain, gait problem, joint swelling,  myalgias and neck stiffness.  Skin: Negative for rash.  Neurological: Negative for dizziness, syncope, speech difficulty, weakness, numbness and headaches.  Hematological: Negative for adenopathy. Does not bruise/bleed easily.  Psychiatric/Behavioral: Negative for behavioral problems and dysphoric mood. The patient is not nervous/anxious.        Objective:   Physical Exam  Constitutional: He is oriented to person, place, and time. He appears well-developed.  HENT:  Head: Normocephalic.  Right Ear: External ear normal.  Left Ear: External ear normal.  Eyes: Conjunctivae and EOM are normal.  Neck: Normal range of motion.  Cardiovascular: Normal rate and normal heart sounds.   Pulmonary/Chest: Effort normal. No respiratory distress. He has no wheezes. He has no rales.  Mildly decreased breath sounds left base Diminished breath sounds right lower hemithorax  Abdominal: Bowel sounds are normal.  Musculoskeletal: Normal range of motion. He exhibits no edema and no tenderness.  Neurological: He is alert and oriented to person, place, and time.  Psychiatric: He has a normal mood and affect. His behavior is normal.          Assessment & Plan:   Right pleural effusion.  Multi-factorial.  Negative cytology Chronic diastolic heart failure Chronic kidney disease  We'll hold diuretic therapy at this time.  Blood pressure low normal Recheck as scheduled in 2 months

## 2013-12-19 NOTE — Patient Instructions (Signed)
Limit your sodium (Salt) intake  Call or return to clinic prn if these symptoms worsen or fail to improve as anticipated.   

## 2013-12-25 ENCOUNTER — Encounter: Payer: Medicare Other | Admitting: Internal Medicine

## 2013-12-27 ENCOUNTER — Encounter (HOSPITAL_BASED_OUTPATIENT_CLINIC_OR_DEPARTMENT_OTHER): Payer: Self-pay | Admitting: Emergency Medicine

## 2013-12-27 ENCOUNTER — Emergency Department (HOSPITAL_BASED_OUTPATIENT_CLINIC_OR_DEPARTMENT_OTHER): Payer: Medicare Other

## 2013-12-27 ENCOUNTER — Emergency Department (HOSPITAL_BASED_OUTPATIENT_CLINIC_OR_DEPARTMENT_OTHER)
Admission: EM | Admit: 2013-12-27 | Discharge: 2013-12-27 | Disposition: A | Discharge status: Home / Self Care | Payer: Medicare Other | Attending: Emergency Medicine | Admitting: Emergency Medicine

## 2013-12-27 DIAGNOSIS — Z862 Personal history of diseases of the blood and blood-forming organs and certain disorders involving the immune mechanism: Secondary | ICD-10-CM | POA: Insufficient documentation

## 2013-12-27 DIAGNOSIS — I509 Heart failure, unspecified: Secondary | ICD-10-CM | POA: Insufficient documentation

## 2013-12-27 DIAGNOSIS — R609 Edema, unspecified: Secondary | ICD-10-CM | POA: Insufficient documentation

## 2013-12-27 DIAGNOSIS — Z8739 Personal history of other diseases of the musculoskeletal system and connective tissue: Secondary | ICD-10-CM | POA: Insufficient documentation

## 2013-12-27 DIAGNOSIS — I10 Essential (primary) hypertension: Secondary | ICD-10-CM | POA: Insufficient documentation

## 2013-12-27 DIAGNOSIS — M109 Gout, unspecified: Secondary | ICD-10-CM | POA: Insufficient documentation

## 2013-12-27 DIAGNOSIS — N390 Urinary tract infection, site not specified: Secondary | ICD-10-CM | POA: Insufficient documentation

## 2013-12-27 DIAGNOSIS — R0989 Other specified symptoms and signs involving the circulatory and respiratory systems: Secondary | ICD-10-CM | POA: Insufficient documentation

## 2013-12-27 DIAGNOSIS — R4789 Other speech disturbances: Secondary | ICD-10-CM | POA: Insufficient documentation

## 2013-12-27 DIAGNOSIS — N400 Enlarged prostate without lower urinary tract symptoms: Secondary | ICD-10-CM | POA: Insufficient documentation

## 2013-12-27 DIAGNOSIS — Z79899 Other long term (current) drug therapy: Secondary | ICD-10-CM | POA: Insufficient documentation

## 2013-12-27 LAB — COMPREHENSIVE METABOLIC PANEL
ALK PHOS: 104 U/L (ref 39–117)
ALT: 12 U/L (ref 0–53)
AST: 29 U/L (ref 0–37)
Albumin: 3.8 g/dL (ref 3.5–5.2)
BILIRUBIN TOTAL: 1 mg/dL (ref 0.3–1.2)
BUN: 27 mg/dL — AB (ref 6–23)
CHLORIDE: 106 meq/L (ref 96–112)
CO2: 22 mEq/L (ref 19–32)
Calcium: 9.7 mg/dL (ref 8.4–10.5)
Creatinine, Ser: 1.3 mg/dL (ref 0.50–1.35)
GFR calc non Af Amer: 48 mL/min — ABNORMAL LOW (ref >90)
GFR, EST AFRICAN AMERICAN: 56 mL/min — AB (ref >90)
GLUCOSE: 95 mg/dL (ref 70–99)
Potassium: 4.3 mEq/L (ref 3.7–5.3)
Sodium: 142 mEq/L (ref 137–147)
Total Protein: 6.6 g/dL (ref 6.0–8.3)

## 2013-12-27 LAB — URINALYSIS, ROUTINE W REFLEX MICROSCOPIC
BILIRUBIN URINE: NEGATIVE
Glucose, UA: NEGATIVE mg/dL
Ketones, ur: NEGATIVE mg/dL
Nitrite: NEGATIVE
PROTEIN: 30 mg/dL — AB
Specific Gravity, Urine: 1.013 (ref 1.005–1.030)
UROBILINOGEN UA: 1 mg/dL (ref 0.0–1.0)
pH: 6.5 (ref 5.0–8.0)

## 2013-12-27 LAB — CBC WITH DIFFERENTIAL/PLATELET
Basophils Absolute: 0 10*3/uL (ref 0.0–0.1)
Basophils Relative: 1 % (ref 0–1)
EOS ABS: 0 10*3/uL (ref 0.0–0.7)
Eosinophils Relative: 1 % (ref 0–5)
HCT: 41.2 % (ref 39.0–52.0)
HEMOGLOBIN: 13.6 g/dL (ref 13.0–17.0)
LYMPHS ABS: 0.6 10*3/uL — AB (ref 0.7–4.0)
Lymphocytes Relative: 11 % — ABNORMAL LOW (ref 12–46)
MCH: 30.6 pg (ref 26.0–34.0)
MCHC: 33 g/dL (ref 30.0–36.0)
MCV: 92.6 fL (ref 78.0–100.0)
MONO ABS: 0.5 10*3/uL (ref 0.1–1.0)
MONOS PCT: 9 % (ref 3–12)
NEUTROS PCT: 78 % — AB (ref 43–77)
Neutro Abs: 4.6 10*3/uL (ref 1.7–7.7)
Platelets: 121 10*3/uL — ABNORMAL LOW (ref 150–400)
RBC: 4.45 MIL/uL (ref 4.22–5.81)
RDW: 14.4 % (ref 11.5–15.5)
WBC: 5.8 10*3/uL (ref 4.0–10.5)

## 2013-12-27 LAB — URINE MICROSCOPIC-ADD ON

## 2013-12-27 LAB — CBG MONITORING, ED: GLUCOSE-CAPILLARY: 87 mg/dL (ref 70–99)

## 2013-12-27 LAB — PRO B NATRIURETIC PEPTIDE: PRO B NATRI PEPTIDE: 347.2 pg/mL (ref 0–450)

## 2013-12-27 LAB — TROPONIN I: Troponin I: <0.3 ng/mL (ref <0.30)

## 2013-12-27 MED ORDER — CIPROFLOXACIN HCL 500 MG PO TABS: 500.0000 mg | ORAL_TABLET | Freq: Two times a day (BID) | ORAL | Status: DC

## 2013-12-27 NOTE — ED Notes (Signed)
Patient transported to X-ray 

## 2013-12-27 NOTE — ED Notes (Signed)
CBG 87.  Antony OdeaMelissa Brantley, RN notified.

## 2013-12-27 NOTE — ED Notes (Signed)
Pt has been having intermittent slurring of speech and confusion for a month and a half.  PMD aware.  Brought here by daughter at the suggestion of home health nurse yesterday.  Has multiple complaints intermittently.  No symptoms today.

## 2013-12-27 NOTE — ED Provider Notes (Addendum)
CSN: [id number]     Arrival date & time 12/27/13  4098 History   First MD Initiated Contact with Patient 12/27/13 [phone]     Chief Complaint  Patient presents with  . Altered Mental Status     (Consider location/radiation/quality/duration/timing/severity/associated sxs/prior Treatment) HPI Comments: Patient with a prior history of hip fracture at the end of February who presents with his daughter with complaint of 1-2 weeks of worsening generalized weakness, more difficulty getting up and walking and slurred speech and fatigue at the end of the day and early in the morning. He denies any true confusion, focal weakness or facial drooping. Patient has been falling asleep throughout the day for the last one week which is unusual. Also he had a mechanical fall yesterday in the middle of the night. He was using his walker to go to the bathroom and states his legs gave out on him. He had no loss of consciousness and does not think he hit his head however he has had some left-sided rib pain since his fall. Patient takes no anticoagulation and denies headache. He has also noticed over the last 1 week he's had bilateral swelling in his feet and some mild subjective shortness of breath.  He denies fever, nausea, vomiting, abdominal pain or change in urinary habits. For months now he's had decreased by mouth intake and states that is unchanged from prior. During the day daughter states that he seems to be his normal self but is falling asleep frequently.  The history is provided by the patient and a relative.    Past Medical History  Diagnosis Date  . Gout   . Insomnia   . BPH (benign prostatic hyperplasia)   . Kidney disorder   . Arthritis   . Hypertension     patient denies on 10/25/12 on no meds   . Complication of anesthesia     hard to wake up -2011 after hernia surgery   . Anemia   . CHF (congestive heart failure)    Past Surgical History  Procedure Laterality Date  . Cataract extraction    .  Cholecystectomy    . Hernia repair  02/22/11    right  . Joint replacement      fractured hip  . Total hip arthroplasty Right 10/29/2012    Procedure: CONVERSION OF PREVIOUS HIP SURGERY TO RIGHT TOTAL HIP AND REMOVAL OF IM NAIL ;  Surgeon: Shelda Pal, MD;  Location: [facility];  Service: Orthopedics;  Laterality: Right;  . Esophagogastroduodenoscopy N/A 11/16/2012    Procedure: ESOPHAGOGASTRODUODENOSCOPY (EGD);  Surgeon: Theda Belfast, MD;  [facility];  Service: Endoscopy;  Laterality: N/A;  . Intramedullary (im) nail intertrochanteric Left 10/17/2013    Procedure: INTRAMEDULLARY (IM) NAIL INTERTROCHANTRIC;  Surgeon: Shelda Pal, MD;  Location: [facility];  Service: Orthopedics;  Laterality: Left;   Family History  Problem Relation Age of Onset  . Heart failure Mother   . Heart failure Father   . Diabetes Neg Hx    History  Substance Use Topics  . Smoking status: Never Smoker   . Smokeless tobacco: Never Used  . Alcohol Use: No    Review of Systems  All other systems reviewed and are negative.     Allergies  Nsaids  Home Medications   Prior to Admission medications   Medication Sig Start Date End Date Taking? Authorizing Provider  allopurinol (ZYLOPRIM) 100 MG tablet Take 100 mg by mouth daily. For gout.    Historical  Provider, MD  naphazoline-glycerin (CLEAR EYES) 0.012-0.2 % SOLN Place 1-2 drops into both eyes every 4 (four) hours as needed for irritation.    Historical Provider, MD  silodosin (RAPAFLO) 8 MG CAPS capsule Take 8 mg by mouth daily with breakfast.    Historical Provider, MD   BP 118/60  Pulse 83  Temp(Src) 98.9 F (37.2 C) (Oral)  Resp 16  Ht 5' 1.5" (1.562 m)  Wt 126 lb (57.153 kg)  BMI 23.42 kg/m2  SpO2 98% Physical Exam  Nursing note and vitals reviewed. Constitutional: He is oriented to person, place, and time. He appears well-developed and well-nourished. No distress.  HENT:  Head: Normocephalic and atraumatic.  Mouth/Throat:  Oropharynx is clear and moist.  Eyes: Conjunctivae and EOM are normal. Pupils are equal, round, and reactive to light.  Neck: Normal range of motion. Neck supple.  Cardiovascular: Normal rate, regular rhythm and intact distal pulses.   No murmur heard. Pulmonary/Chest: Effort normal. No respiratory distress. He has decreased breath sounds in the right lower field. He has no wheezes. He has rales in the right middle field, the left middle field and the left lower field.   He exhibits no crepitus and no deformity.  Abdominal: Soft. He exhibits no distension. There is no tenderness. There is no rebound and no guarding.  Musculoskeletal: Normal range of motion. He exhibits edema. He exhibits no tenderness.  2+ pitting edema bilaterally of the feet  Neurological: He is alert and oriented to person, place, and time. He has normal strength. No cranial nerve deficit or sensory deficit.  Skin: Skin is warm and dry. No rash noted. No erythema.  Psychiatric: He has a normal mood and affect. His behavior is normal.    ED Course  Procedures (including critical care time) Labs Review Labs Reviewed  CBC WITH DIFFERENTIAL - Abnormal; Notable for the following:    Platelets 121 (*)    Neutrophils Relative % 78 (*)    Lymphocytes Relative 11 (*)    Lymphs Abs 0.6 (*)    All other components within normal limits  COMPREHENSIVE METABOLIC PANEL - Abnormal; Notable for the following:    BUN 27 (*)    GFR calc non Af Amer 48 (*)    GFR calc Af Amer 56 (*)    All other components within normal limits  URINALYSIS, ROUTINE W REFLEX MICROSCOPIC - Abnormal; Notable for the following:    APPearance TURBID (*)    Hgb urine dipstick LARGE (*)    Protein, ur 30 (*)    Leukocytes, UA LARGE (*)    All other components within normal limits  URINE MICROSCOPIC-ADD ON - Abnormal; Notable for the following:    Bacteria, UA MANY (*)    All other components within normal limits  URINE CULTURE  PRO B NATRIURETIC  PEPTIDE  TROPONIN I  CBG MONITORING, ED    Imaging Review Dg Chest 2 View  12/27/2013   CLINICAL DATA:  mental altered state  EXAM: CHEST  2 VIEW  COMPARISON:  12/12/2013  FINDINGS: Normal heart size. There is a moderate right pleural effusion. This is unchanged in volume from previous exam. Left lung is clear. No airspace consolidation. Compression fractures are noted within the lower thoracic/ upper lumbar spine.  IMPRESSION: 1. Persistent moderate right pleural effusion.   Electronically Signed   By: Signa Kellaylor  Stroud M.D.   On: 12/27/2013 11:00   Ct Head Wo Contrast  12/27/2013   CLINICAL DATA:  Slurred speech.  EXAM: CT HEAD WITHOUT CONTRAST  TECHNIQUE: Contiguous axial images were obtained from the base of the skull through the vertex without intravenous contrast.  COMPARISON:  10/17/13  FINDINGS: Prominence of the sulci and ventricles identified compatible with brain atrophy. Mild low attenuation within the subcortical and periventricular white matter is identified compatible with chronic small vessel ischemic change. No acute cortical infarct, hemorrhage, or mass lesion ispresent. No significant extra-axial fluid collection is present. The paranasal sinuses andmastoid air cells are clear. The osseous skull is intact.  IMPRESSION: 1. No acute intracranial abnormalities. 2. Small vessel ischemic change and brain atrophy.   Electronically Signed   By: Signa Kellaylor  Stroud M.D.   On: 12/27/2013 11:08     EKG Interpretation   Date/Time:  Friday Dec 27 2013 09:42:27 EDT Ventricular Rate:  84 PR Interval:  124 QRS Duration: 74 QT Interval:  372 QTC Calculation: 439 R Axis:   -7 Text Interpretation:  Normal sinus rhythm Normal ECG No significant change  since last tracing Confirmed by Anitra LauthPLUNKETT  MD, Alphonzo LemmingsWHITNEY ([id number]) on 12/27/2013  9:51:14 AM      MDM   Final diagnoses:  UTI (lower urinary tract infection)    Patient being brought in by his daughter with a complaint of generalized weakness,  increased fatigue, slurred speech at the end of the day and bilateral foot swelling for the last one to 2 weeks. Patient is currently awake oriented without complaints. He did yesterday in the middle of the night while going to the bathroom he states his legs just gave out on him. He denies loss of consciousness or headache. He currently is taking no anticoagulation. On exam patient has 2+ pitting edema in bilateral feet with decreased breath sounds on the right and rales in the left.  Patient has a history of chronic CHF with a chronic right-sided pleural effusion.  He also has history of BPH with recurrent UTIs. Last had his urine checked about one to 2 weeks ago and it was normal.  Patient's symptoms do not sound stroke related as his slurred speech and fatigue occur at the same time usually at the end of the day. He denies any focal weakness, any aphasia or other persistent symptoms. he denies any mind altering medications that could be the cause. Concern for UTI versus worsening CHF as the cause of his symptoms. Also given recent fall will do a CT of his head to rule out bleed.  EKG is within normal limits. Chest x-ray, CBC, CMP, BNP, UA, troponin pending. Blood sugar is 86.  11:15 AM Lab are unchanged from prior chest x-ray with stable right-sided effusion.  A. consistent with urinary tract infection with too numerous to count white blood cells and feel the cause of the patient's symptoms.  Spoke with Dr. Madilyn HookNesi's office and pt's last culture grew enterobacter sensitive to everything except amox and cefazolin.  Will start on cipro and reculture here. Gwyneth SproutWhitney Evian Derringer, MD 12/27/13 1116  Gwyneth SproutWhitney Harue Pribble, MD 12/27/13 1120  Gwyneth SproutWhitney Dejion Grillo, MD 12/27/13 1122

## 2013-12-27 NOTE — ED Notes (Signed)
Returned from radiology family at bedside

## 2013-12-29 LAB — URINE CULTURE: Colony Count: 60000

## 2013-12-30 ENCOUNTER — Telehealth (HOSPITAL_BASED_OUTPATIENT_CLINIC_OR_DEPARTMENT_OTHER): Payer: Self-pay | Admitting: Emergency Medicine

## 2013-12-30 NOTE — Progress Notes (Signed)
ED Antimicrobial Stewardship Positive Culture Follow Up   [name] is an 80 y.o. male who [facility] with a chief complaint of  Chief Complaint  Patient presents with  . Altered Mental Status    Recent Results (from the past 720 hour(s))  URINE CULTURE     Status: None   Collection Time    12/27/13 10:20 AM      Result Value Ref Range Status   Specimen Description URINE, CLEAN CATCH   Final   Special Requests NONE   Final   Culture  Setup Time     Final   Value: 12/27/2013 15:14     Performed at [facility]   Colony Count     Final   Value: 60,000 COLONIES/ML     Performed at [facility]   Culture     Final   Value: ENTEROCOCCUS SPECIES     Performed at [facility]   Report Status 12/29/2013 FINAL   Final   Organism ID, Bacteria ENTEROCOCCUS SPECIES   Final    [x]  Treated with Cipro, organism resistant to prescribed antimicrobial 80 yo who came in with AMS and weakness. He has a hx UTI. He was dc on cipro but it came back with quinolones resistant enterococcus.   New antibiotic prescription:  Dc Cipro Start Amoxicillin 500mg  PO BID x7 days  ED Provider: Emi HolesPieperbrink, [place]   Ulyses SouthwardMinh Vidya Horn, PharmD Pager: [phone] Infectious Diseases Pharmacist Phone# [phone]

## 2013-12-30 NOTE — Telephone Encounter (Signed)
Post ED Visit - Positive Culture Follow-up: Successful Patient Follow-Up  Culture assessed and recommendations reviewed by: []  Wes Dulaney, Pharm.D., BCPS []  Celedonio MiyamotoJeremy Frens, Pharm.D., BCPS []  Georgina PillionElizabeth Martin, [address]Ph[address]D., BCPS [x]  TazlinaMinh Pham, [address]Ph[address]D., BCPS, AAHIVP []  Estella HuskMichelle Turner, Pharm.D., BCPS, AAHIVP  Positive urine culture  []  Patient discharged without antimicrobial prescription and treatment is now indicated [x]  Organism is resistant to prescribed ED discharge antimicrobial []  Patient with positive blood cultures  Changes discussed with ED provider: Francee PiccoloJennifer Piepenbrink PA-C New antibiotic prescription: Amoxicillin 500 mg PO BID x 7 days    [facility] Hospital[facility]ylie Colan Laymon 12/30/2013, 1:45 PM

## 2014-01-02 ENCOUNTER — Telehealth (HOSPITAL_BASED_OUTPATIENT_CLINIC_OR_DEPARTMENT_OTHER): Payer: Self-pay | Admitting: Emergency Medicine

## 2014-01-05 ENCOUNTER — Telehealth (HOSPITAL_BASED_OUTPATIENT_CLINIC_OR_DEPARTMENT_OTHER): Payer: Self-pay | Admitting: Emergency Medicine

## 2014-01-05 NOTE — Telephone Encounter (Signed)
Rx for Amoxicillin 500 mg PO BID x 7 days, prescribed by Francee PiccoloJennifer Piepenbrink PA-C, [facility] [place] [phone]) by Norm ParcelShannon Gammons PFM.

## 2014-01-07 ENCOUNTER — Telehealth: Payer: Self-pay | Admitting: Internal Medicine

## 2014-01-07 MED ORDER — CYCLOBENZAPRINE HCL 5 MG PO TABS: 5.0000 mg | ORAL_TABLET | Freq: Three times a day (TID) | ORAL | Status: DC | PRN

## 2014-01-07 NOTE — Telephone Encounter (Signed)
Discussed muscle relaxer with Dr.K, verbal order given for Cyclobenziprine 5 mg one tablet three times a day as needed.

## 2014-01-07 NOTE — Telephone Encounter (Signed)
Please advise 

## 2014-01-07 NOTE — Telephone Encounter (Signed)
ok 

## 2014-01-07 NOTE — Telephone Encounter (Signed)
[facility] would like to add one more month for physicial therapy. Dr Charlann Boxerlin has signed off w/ pt , no longer seeing and they hope dr Kirtland Bouchardk will approve this PT Would also like if dr Kirtland Bouchardk would rx a muscle relaxant due to lower extremity spasticity Verbal ok is great

## 2014-01-07 NOTE — Telephone Encounter (Signed)
Spoke to CopelandMichelle, told her okay to continue Physical Therapy for another month per Dr. Kirtland BouchardK and also Rx sent to pharmacy for Cyclobenzaprine TID prn to help with muscle spasms. Marcelino DusterMichelle verbalized understanding.

## 2014-02-03 ENCOUNTER — Telehealth: Payer: Self-pay | Admitting: Internal Medicine

## 2014-02-03 MED ORDER — SERTRALINE HCL 25 MG PO TABS: 25.0000 mg | ORAL_TABLET | Freq: Every day | ORAL | Status: DC

## 2014-02-03 NOTE — Telephone Encounter (Signed)
Sertraline 25 mg, #60, one daily Follow up office visit in 4 weeks

## 2014-02-03 NOTE — Telephone Encounter (Signed)
Pt daughter called to ask if a anti depressant something mild can be called in for her dad  She said he has no motivation and she would  Little a  boost to help him.Marland Kitchen. Pharm[facility]

## 2014-02-03 NOTE — Telephone Encounter (Signed)
Rx sent to pharmacy and daughter is aware.  Patient already has a scheduled appointment.

## 2014-02-18 ENCOUNTER — Ambulatory Visit (INDEPENDENT_AMBULATORY_CARE_PROVIDER_SITE_OTHER): Payer: Medicare Other | Admitting: Internal Medicine

## 2014-02-18 ENCOUNTER — Encounter: Payer: Self-pay | Admitting: Internal Medicine

## 2014-02-18 VITALS — BP 110/64 | HR 102 | Temp 97.9°F | Resp 20 | Ht 61.5 in | Wt 121.0 lb

## 2014-02-18 DIAGNOSIS — R4789 Other speech disturbances: Secondary | ICD-10-CM

## 2014-02-18 DIAGNOSIS — R1310 Dysphagia, unspecified: Secondary | ICD-10-CM

## 2014-02-18 DIAGNOSIS — R531 Weakness: Secondary | ICD-10-CM

## 2014-02-18 DIAGNOSIS — R5383 Other fatigue: Secondary | ICD-10-CM

## 2014-02-18 DIAGNOSIS — R479 Unspecified speech disturbances: Secondary | ICD-10-CM

## 2014-02-18 DIAGNOSIS — J90 Pleural effusion, not elsewhere classified: Secondary | ICD-10-CM

## 2014-02-18 DIAGNOSIS — R5381 Other malaise: Secondary | ICD-10-CM

## 2014-02-18 LAB — POCT URINALYSIS DIPSTICK
Bilirubin, UA: 1
Blood, UA: NEGATIVE
Glucose, UA: NEGATIVE
KETONES UA: NEGATIVE
Leukocytes, UA: NEGATIVE
Nitrite, UA: NEGATIVE
PH UA: 6
SPEC GRAV UA: 1.02
Urobilinogen, UA: 1

## 2014-02-18 NOTE — Progress Notes (Signed)
Subjective:    [name], male    DOB: [birth year], 80 y.o.   MRN: [id number]  HPI 80 year old patient who is seen today in followup.  His chief complaint is slurred speech.  This happened about 6 weeks ago, and he was seen at [facility], ED, and evaluation, was normal except for a UTI.  This included a head CT scan.  Lab screen was unremarkable.  After a day or 2.  His speech normalized.  He is accompanied by a daughter today.  He also feels his speech is different, but certainly does not appear to be slurred to this observer.  No other focal neurological signs or symptoms He also complains of difficulty swallowing water, but no difficulty with Ensure or solid foods  Past Medical History  Diagnosis Date  . Gout   . Insomnia   . BPH (benign prostatic hyperplasia)   . Kidney disorder   . Arthritis   . Hypertension     patient denies on 10/25/12 on no meds   . Complication of anesthesia     hard to wake up -2011 after hernia surgery   . Anemia   . CHF (congestive heart failure)     History   Social History  . Marital Status: Married    Spouse Name: N/A    Number of Children: N/A  . Years of Education: N/A   Occupational History  . Not on file.   Social History Main Topics  . Smoking status: Never Smoker   . Smokeless tobacco: Never Used  . Alcohol Use: No  . Drug Use: No  . Sexual Activity: No   Other Topics Concern  . Not on file   Social History Narrative  . No narrative on file    Past Surgical History  Procedure Laterality Date  . Cataract extraction    . Cholecystectomy    . Hernia repair  02/22/11    right  . Joint replacement      fractured hip  . Total hip arthroplasty Right 10/29/2012    Procedure: CONVERSION OF PREVIOUS HIP SURGERY TO RIGHT TOTAL HIP AND REMOVAL OF IM NAIL ;  Surgeon: Shelda PalMatthew D Olin, MD;  Location: [facility];  Service: Orthopedics;  Laterality: Right;  . Esophagogastroduodenoscopy N/A 11/16/2012    Procedure:  ESOPHAGOGASTRODUODENOSCOPY (EGD);  Surgeon: Theda BelfastPatrick D Hung, MD;  [facility];  Service: Endoscopy;  Laterality: N/A;  . Intramedullary (im) nail intertrochanteric Left 10/17/2013    Procedure: INTRAMEDULLARY (IM) NAIL INTERTROCHANTRIC;  Surgeon: Shelda PalMatthew D Olin, MD;  Location: [facility];  Service: Orthopedics;  Laterality: Left;    Family History  Problem Relation Age of Onset  . Heart failure Mother   . Heart failure Father   . Diabetes Neg Hx     Allergies  Allergen Reactions  . Sertraline Nausea Only  . Nsaids Other (See Comments)    history of renal failure    Current Outpatient Prescriptions on File Prior to Visit  Medication Sig Dispense Refill  . allopurinol (ZYLOPRIM) 100 MG tablet Take 100 mg by mouth daily. For gout.      . naphazoline-glycerin (CLEAR EYES) 0.012-0.2 % SOLN Place 1-2 drops into both eyes every 4 (four) hours as needed for irritation.      . silodosin (RAPAFLO) 8 MG CAPS capsule Take 8 mg by mouth daily with breakfast.       No current facility-administered medications on file prior to visit.    BP  110/64  Pulse 102  Temp(Src) 97.9 F (36.6 C) (Oral)  Resp 20  Ht 5' 1.5" (1.562 m)  Wt 121 lb (54.885 kg)  BMI 22.50 kg/m2  SpO2 98%       Review of Systems  Constitutional: Negative for fever, chills, appetite change and fatigue.  HENT: Negative for congestion, dental problem, ear pain, hearing loss, sore throat, tinnitus, trouble swallowing and voice change.   Eyes: Negative for pain, discharge and visual disturbance.  Respiratory: Negative for cough, chest tightness, wheezing and stridor.   Cardiovascular: Negative for chest pain, palpitations and leg swelling.  Gastrointestinal: Negative for nausea, vomiting, abdominal pain, diarrhea, constipation, blood in stool and abdominal distention.  Genitourinary: Negative for urgency, hematuria, flank pain, discharge, difficulty urinating and genital sores.  Musculoskeletal: Negative for  arthralgias, back pain, gait problem, joint swelling, myalgias and neck stiffness.  Skin: Negative for rash.  Neurological: Positive for speech difficulty. Negative for dizziness, syncope, weakness, numbness and headaches.  Hematological: Negative for adenopathy. Does not bruise/bleed easily.  Psychiatric/Behavioral: Negative for behavioral problems and dysphoric mood. The patient is not nervous/anxious.        Objective:   Physical Exam  Constitutional: He is oriented to person, place, and time. He appears well-developed. No distress.  Elderly, alert, no distress Blood pressure low normal  Afebrile Oxygen  saturation 98%  HENT:  Head: Normocephalic.  Right Ear: External ear normal.  Left Ear: External ear normal.  Eyes: Conjunctivae and EOM are normal.  Neck: Normal range of motion.  Cardiovascular: Normal rate, regular rhythm and normal heart sounds.   Pulmonary/Chest: Effort normal and breath sounds normal. No respiratory distress. He has no wheezes. He has no rales.  Dullness and diminished breath sounds right lower hemithorax  Abdominal: Bowel sounds are normal.  Musculoskeletal: Normal range of motion. He exhibits no edema and no tenderness.  Neurological: He is alert and oriented to person, place, and time.  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 His behavior is normal.          Assessment & Plan:   Altered speech.  Patient does describe some mild excessive mucus production and minimal cough.  Doubt acute CNS event.  We'll observe Mild liquid dysphagia.  Will observe.  The patient is tolerating Ensure and solids well.  No nausea or vomiting Chronic right pleural effusion  Patient has CP, X. Next, month.  We'll reassess at that time

## 2014-02-18 NOTE — Patient Instructions (Signed)
Call or return to clinic prn if these symptoms worsen or fail to improve as anticipated.  Annual exam next month as scheduled 

## 2014-02-18 NOTE — Progress Notes (Signed)
Pre visit review using our clinic review tool, if applicable. No additional management support is needed unless otherwise documented below in the visit note. 

## 2014-03-03 ENCOUNTER — Encounter (HOSPITAL_BASED_OUTPATIENT_CLINIC_OR_DEPARTMENT_OTHER): Payer: Self-pay | Admitting: Emergency Medicine

## 2014-03-03 ENCOUNTER — Emergency Department (HOSPITAL_BASED_OUTPATIENT_CLINIC_OR_DEPARTMENT_OTHER)
Admission: EM | Admit: 2014-03-03 | Discharge: 2014-03-03 | Disposition: A | Discharge status: Home / Self Care | Payer: Medicare Other | Attending: Emergency Medicine | Admitting: Emergency Medicine

## 2014-03-03 DIAGNOSIS — I509 Heart failure, unspecified: Secondary | ICD-10-CM | POA: Insufficient documentation

## 2014-03-03 DIAGNOSIS — Z9849 Cataract extraction status, unspecified eye: Secondary | ICD-10-CM | POA: Insufficient documentation

## 2014-03-03 DIAGNOSIS — R5383 Other fatigue: Secondary | ICD-10-CM

## 2014-03-03 DIAGNOSIS — I10 Essential (primary) hypertension: Secondary | ICD-10-CM | POA: Insufficient documentation

## 2014-03-03 DIAGNOSIS — R4781 Slurred speech: Secondary | ICD-10-CM

## 2014-03-03 DIAGNOSIS — Z862 Personal history of diseases of the blood and blood-forming organs and certain disorders involving the immune mechanism: Secondary | ICD-10-CM | POA: Insufficient documentation

## 2014-03-03 DIAGNOSIS — M109 Gout, unspecified: Secondary | ICD-10-CM | POA: Insufficient documentation

## 2014-03-03 DIAGNOSIS — R05 Cough: Secondary | ICD-10-CM | POA: Insufficient documentation

## 2014-03-03 DIAGNOSIS — R5381 Other malaise: Secondary | ICD-10-CM | POA: Insufficient documentation

## 2014-03-03 DIAGNOSIS — F29 Unspecified psychosis not due to a substance or known physiological condition: Secondary | ICD-10-CM | POA: Insufficient documentation

## 2014-03-03 DIAGNOSIS — R404 Transient alteration of awareness: Secondary | ICD-10-CM | POA: Insufficient documentation

## 2014-03-03 DIAGNOSIS — R410 Disorientation, unspecified: Secondary | ICD-10-CM

## 2014-03-03 DIAGNOSIS — R1310 Dysphagia, unspecified: Secondary | ICD-10-CM | POA: Insufficient documentation

## 2014-03-03 DIAGNOSIS — N390 Urinary tract infection, site not specified: Secondary | ICD-10-CM | POA: Insufficient documentation

## 2014-03-03 DIAGNOSIS — Z79899 Other long term (current) drug therapy: Secondary | ICD-10-CM | POA: Insufficient documentation

## 2014-03-03 DIAGNOSIS — R059 Cough, unspecified: Secondary | ICD-10-CM | POA: Insufficient documentation

## 2014-03-03 DIAGNOSIS — R4789 Other speech disturbances: Secondary | ICD-10-CM | POA: Insufficient documentation

## 2014-03-03 DIAGNOSIS — N400 Enlarged prostate without lower urinary tract symptoms: Secondary | ICD-10-CM | POA: Insufficient documentation

## 2014-03-03 DIAGNOSIS — Z8669 Personal history of other diseases of the nervous system and sense organs: Secondary | ICD-10-CM | POA: Insufficient documentation

## 2014-03-03 DIAGNOSIS — Z8739 Personal history of other diseases of the musculoskeletal system and connective tissue: Secondary | ICD-10-CM | POA: Insufficient documentation

## 2014-03-03 LAB — CBC WITH DIFFERENTIAL/PLATELET
BASOS ABS: 0 10*3/uL (ref 0.0–0.1)
Basophils Relative: 0 % (ref 0–1)
EOS PCT: 0 % (ref 0–5)
Eosinophils Absolute: 0 10*3/uL (ref 0.0–0.7)
HEMATOCRIT: 41.7 % (ref 39.0–52.0)
Hemoglobin: 13.9 g/dL (ref 13.0–17.0)
LYMPHS PCT: 5 % — AB (ref 12–46)
Lymphs Abs: 0.6 10*3/uL — ABNORMAL LOW (ref 0.7–4.0)
MCH: 30.5 pg (ref 26.0–34.0)
MCHC: 33.3 g/dL (ref 30.0–36.0)
MCV: 91.6 fL (ref 78.0–100.0)
MONO ABS: 0.8 10*3/uL (ref 0.1–1.0)
Monocytes Relative: 6 % (ref 3–12)
Neutro Abs: 12.4 10*3/uL — ABNORMAL HIGH (ref 1.7–7.7)
Neutrophils Relative %: 90 % — ABNORMAL HIGH (ref 43–77)
Platelets: 124 10*3/uL — ABNORMAL LOW (ref 150–400)
RBC: 4.55 MIL/uL (ref 4.22–5.81)
RDW: 15.1 % (ref 11.5–15.5)
WBC: 13.8 10*3/uL — AB (ref 4.0–10.5)

## 2014-03-03 LAB — BASIC METABOLIC PANEL
ANION GAP: 17 — AB (ref 5–15)
BUN: 30 mg/dL — AB (ref 6–23)
CALCIUM: 9.7 mg/dL (ref 8.4–10.5)
CO2: 20 meq/L (ref 19–32)
CREATININE: 1.3 mg/dL (ref 0.50–1.35)
Chloride: 109 mEq/L (ref 96–112)
GFR calc Af Amer: 55 mL/min — ABNORMAL LOW (ref >90)
GFR calc non Af Amer: 48 mL/min — ABNORMAL LOW (ref >90)
Glucose, Bld: 119 mg/dL — ABNORMAL HIGH (ref 70–99)
Potassium: 4.2 mEq/L (ref 3.7–5.3)
Sodium: 146 mEq/L (ref 137–147)

## 2014-03-03 LAB — URINALYSIS, ROUTINE W REFLEX MICROSCOPIC
Glucose, UA: NEGATIVE mg/dL
Hgb urine dipstick: NEGATIVE
Ketones, ur: 15 mg/dL — AB
Nitrite: NEGATIVE
Protein, ur: NEGATIVE mg/dL
Specific Gravity, Urine: 1.018 (ref 1.005–1.030)
UROBILINOGEN UA: 1 mg/dL (ref 0.0–1.0)
pH: 5.5 (ref 5.0–8.0)

## 2014-03-03 LAB — URINE MICROSCOPIC-ADD ON

## 2014-03-03 MED ORDER — CIPROFLOXACIN HCL 500 MG PO TABS
500.0000 mg | ORAL_TABLET | Freq: Once | ORAL | Status: AC
  Administered 2014-03-03: 500 mg via ORAL

## 2014-03-03 MED ORDER — CIPROFLOXACIN HCL 500 MG PO TABS
ORAL_TABLET | ORAL | Status: AC
  Filled 2014-03-03: qty 1

## 2014-03-03 MED ORDER — CIPROFLOXACIN HCL 500 MG PO TABS: 500.0000 mg | ORAL_TABLET | Freq: Two times a day (BID) | ORAL | Status: DC

## 2014-03-03 NOTE — Discharge Instructions (Signed)
Dysphagia Swallowing problems (dysphagia) occur when solids and liquids seem to stick in your throat on the way down to your stomach, or the food takes longer to get to the stomach. Other symptoms include regurgitating food, noises coming from the throat, chest discomfort with swallowing, and a feeling of fullness or the feeling of something being stuck in your throat when swallowing. When blockage in your throat is complete it may be associated with drooling. CAUSES  Problems with swallowing may occur because of problems with the muscles. The food cannot be propelled in the usual manner into your stomach. You may have ulcers, scar tissue, or inflammation in the tube down which food travels from your mouth to your stomach (esophagus), which blocks food from passing normally into the stomach. Causes of inflammation include:  Acid reflux from your stomach into your esophagus.  Infection.  Radiation treatment for cancer.  Medicines taken without enough fluids to wash them down into your stomach. You may have nerve problems that prevent signals from being sent to the muscles of your esophagus to contract and move your food down to your stomach. Globus pharyngeus is a relatively common problem in which there is a sense of an obstruction or difficulty in swallowing, without any physical abnormalities of the swallowing passages being found. This problem usually improves over time with reassurance and testing to rule out other causes. DIAGNOSIS Dysphagia can be diagnosed and its cause can be determined by tests in which you swallow a white substance that helps illuminate the inside of your throat (contrast medium) while X-rays are taken. Sometimes a flexible telescope that is inserted down your throat (endoscopy) to look at your esophagus and stomach is used. TREATMENT   If the dysphagia is caused by acid reflux or infection, medicines may be used.  If the dysphagia is caused by problems with your  swallowing muscles, swallowing therapy may be used to help you strengthen your swallowing muscles.  If the dysphagia is caused by a blockage or mass, procedures to remove the blockage may be done. HOME CARE INSTRUCTIONS  Try to eat soft food that is easier to swallow and check your weight on a daily basis to be sure that it is not decreasing.  Be sure to drink liquids when sitting upright (not lying down). SEEK MEDICAL CARE IF:  You are losing weight because you are unable to swallow.  You are coughing when you drink liquids (aspiration).  You are coughing up partially digested food. SEEK IMMEDIATE MEDICAL CARE IF:  You are unable to swallow your own saliva .  You are having shortness of breath or a fever, or both.  You have a hoarse voice along with difficulty swallowing. MAKE SURE YOU:  Understand these instructions.  Will watch your condition.  Will get help right away if you are not doing well or get worse. Document Released: 08/05/2000 Document Revised: 04/10/2013 Document Reviewed: 01/25/2013 [facility] Patient Information 2015 [place]. This information is not intended to replace advice given to you by your health care provider. Make sure you discuss any questions you have with your health care provider.   Urinary Tract Infection Urinary tract infections (UTIs) can develop anywhere along your urinary tract. Your urinary tract is your body's drainage system for removing wastes and extra water. Your urinary tract includes two kidneys, two ureters, a bladder, and a urethra. Your kidneys are a pair of bean-shaped organs. Each kidney is about the size of your fist. They are located below your ribs,  one on each side of your spine. CAUSES Infections are caused by microbes, which are microscopic organisms, including fungi, viruses, and bacteria. These organisms are so small that they can only be seen through a microscope. Bacteria are the microbes that most commonly cause  UTIs. SYMPTOMS  Symptoms of UTIs may vary by age and gender of the patient and by the location of the infection. Symptoms in young women typically include a frequent and intense urge to urinate and a painful, burning feeling in the bladder or urethra during urination. Older women and men are more likely to be tired, shaky, and weak and have muscle aches and abdominal pain. A fever may mean the infection is in your kidneys. Other symptoms of a kidney infection include pain in your back or sides below the ribs, nausea, and vomiting. DIAGNOSIS To diagnose a UTI, your caregiver will ask you about your symptoms. Your caregiver also will ask to provide a urine sample. The urine sample will be tested for bacteria and white blood cells. White blood cells are made by your body to help fight infection. TREATMENT  Typically, UTIs can be treated with medication. Because most UTIs are caused by a bacterial infection, they usually can be treated with the use of antibiotics. The choice of antibiotic and length of treatment depend on your symptoms and the type of bacteria causing your infection. HOME CARE INSTRUCTIONS  If you were prescribed antibiotics, take them exactly as your caregiver instructs you. Finish the medication even if you feel better after you have only taken some of the medication.  Drink enough water and fluids to keep your urine clear or pale yellow.  Avoid caffeine, tea, and carbonated beverages. They tend to irritate your bladder.  Empty your bladder often. Avoid holding urine for long periods of time.  Empty your bladder before and after sexual intercourse.  After a bowel movement, women should cleanse from front to back. Use each tissue only once. SEEK MEDICAL CARE IF:   You have back pain.  You develop a fever.  Your symptoms do not begin to resolve within 3 days. SEEK IMMEDIATE MEDICAL CARE IF:   You have severe back pain or lower abdominal pain.  You develop chills.  You  have nausea or vomiting.  You have continued burning or discomfort with urination. MAKE SURE YOU:   Understand these instructions.  Will watch your condition.  Will get help right away if you are not doing well or get worse. Document Released: 05/18/2005 Document Revised: 02/07/2012 Document Reviewed: 09/16/2011 [facility] Patient Information 2015 [place], [place]. This information is not intended to replace advice given to you by your health care provider. Make sure you discuss any questions you have with your health care provider.

## 2014-03-03 NOTE — ED Provider Notes (Signed)
CSN: [id number]     Arrival date & time 03/03/14  1922 History  This chart was scribed for Shanna CiscoMegan E Docherty, MD by Quintella ReichertMatthew Underwood, ED scribe.  This patient was seen in room MH05/MH05 and the patient's care was started at 9:13 PM.   Chief Complaint  Patient presents with  . Dysphagia    The history is provided by the patient and a relative. No language interpreter was used.    HPI Comments: Jordan Gallusarl L Wilhite is a 80 y.o. male who presents to the Emergency Department complaining of intermittent difficulty swallowing liquids over the past 18 days, with associated speech difficulty and occasional confusion.  Pt states [facility] he has had difficulty swallowing liquids.  He states that when he drinks "I feel like I'm gonna gag and I start coughing, I cough for quite some time until it clears up, it's even come out of my nose."  He reports more difficulty swallowing over the past few days.  He states the trouble is more with liquids and less with solids.  He states that he also began coughing when he ate ice cream.  He denies throat pain or swelling.  He does feel that he has some drainage in the back of his throat.  He states he has noticed that his eyelids become droopy and his hands occasionally feel weak at the end of the day, but weakness improves when he moves his hands.  Daughter states she brought him to the ED because "we cannot understand him when he talk,"  "like he's thick-tongued."  His wife has noticed this in the mornings and evenings and daughter noticed it tonight during dinner.  Daughter also reports that pt has occasionally seems confused.  She states that for the past few nights he has been waking up his wife at 3 AM and telling her "he cannot get up and walk because the carpet's too slippery."  She also states he is sleeping more than usual.  Pt denies confusion currently.  He is eating and urinating.  He denies nasal congestion, headaches, fevers, nausea,  vomiting, numbness, weakness, difficulty urinating, or dysuria.  Daughter notes that pt has h/o recurrent UTIs due to urinary retention.  He usually does not have associated dysuria but instead becomes confused.  She denies any recent medication changes.  Pt has not seen an ENT or GI for these symptoms.  [place].  Pt fractured his hip in February and had surgery on 2/26.  [facility] but has been back home since March.  This is his 3rd hip surgery in 3 years.      Past Medical History  Diagnosis Date  . Gout   . Insomnia   . BPH (benign prostatic hyperplasia)   . Kidney disorder   . Arthritis   . Hypertension     patient denies on 10/25/12 on no meds   . Complication of anesthesia     hard to wake up -2011 after hernia surgery   . Anemia   . CHF (congestive heart failure)     Past Surgical History  Procedure Laterality Date  . Cataract extraction    . Cholecystectomy    . Hernia repair  02/22/11    right  . Joint replacement      fractured hip  . Total hip arthroplasty Right 10/29/2012    Procedure: CONVERSION OF PREVIOUS HIP SURGERY  TO RIGHT TOTAL HIP AND REMOVAL OF IM NAIL ;  Surgeon: Shelda Pal, MD;  Location: [facility];  Service: Orthopedics;  Laterality: Right;  . Esophagogastroduodenoscopy N/A 11/16/2012    Procedure: ESOPHAGOGASTRODUODENOSCOPY (EGD);  Surgeon: Theda Belfast, MD;  [facility];  Service: Endoscopy;  Laterality: N/A;  . Intramedullary (im) nail intertrochanteric Left 10/17/2013    Procedure: INTRAMEDULLARY (IM) NAIL INTERTROCHANTRIC;  Surgeon: Shelda Pal, MD;  Location: [facility];  Service: Orthopedics;  Laterality: Left;    Family History  Problem Relation Age of Onset  . Heart failure Mother   . Heart failure Father   . Diabetes Neg Hx     History  Substance Use Topics  . Smoking status: Never Smoker   . Smokeless tobacco: Never Used  . Alcohol Use: No      Review of Systems  Constitutional: Positive for fatigue. Negative for fever.  HENT: Positive for trouble swallowing. Negative for congestion, facial swelling and rhinorrhea.   Eyes: Negative for photophobia and pain.  Respiratory: Negative for cough, chest tightness and shortness of breath.   Cardiovascular: Negative for chest pain and leg swelling.  Gastrointestinal: Negative for nausea, vomiting, abdominal pain, diarrhea and constipation.  Endocrine: Negative for polydipsia and polyuria.  Genitourinary: Negative for dysuria, urgency, decreased urine volume and difficulty urinating.  Musculoskeletal: Negative for back pain and gait problem.  Skin: Negative for color change, rash and wound.  Allergic/Immunologic: Negative for immunocompromised state.  Neurological: Negative for dizziness, facial asymmetry, speech difficulty, weakness, numbness and headaches.  Psychiatric/Behavioral: Positive for confusion. Negative for decreased concentration and agitation.      Allergies  Sertraline and Nsaids  Home Medications   Prior to Admission medications   Medication Sig Start Date End Date Taking? Authorizing Provider  allopurinol (ZYLOPRIM) 100 MG tablet Take 100 mg by mouth daily. For gout.   Yes Historical Provider, MD  naphazoline-glycerin (CLEAR EYES) 0.012-0.2 % SOLN Place 1-2 drops into both eyes every 4 (four) hours as needed for irritation.   Yes Historical Provider, MD  silodosin (RAPAFLO) 8 MG CAPS capsule Take 8 mg by mouth daily with breakfast.   Yes Historical Provider, MD  ciprofloxacin (CIPRO) 500 MG tablet Take 1 tablet (500 mg total) by mouth 2 (two) times daily. One po bid x 7 days 03/03/14   Shanna Cisco, MD   BP 119/62  Pulse 105  Temp(Src) 98.8 F (37.1 C) (Oral)  Resp 18  Wt 121 lb (54.885 kg)  SpO2 96%  Physical Exam  Nursing note and vitals reviewed. Constitutional: He is oriented to person, place, and time. He appears well-developed and  well-nourished. No distress.  HENT:  Head: Normocephalic and atraumatic.  Mouth/Throat: Uvula is midline, oropharynx is clear and moist and mucous membranes are normal. No oropharyngeal exudate, posterior oropharyngeal edema or posterior oropharyngeal erythema.  Eyes: Right pupil is round and reactive. Left pupil is round and reactive. Pupils are unequal.  Unequal pupils, right is 2mm and left is 1mm  Neck: Normal range of motion. Neck supple.  Cardiovascular: Normal rate, regular rhythm and normal heart sounds.  Exam reveals no gallop and no friction rub.   No murmur heard. Pulmonary/Chest: Effort normal. No respiratory distress. He has decreased breath sounds (right lower lobe). He has no wheezes. He has no rales.  Abdominal: Soft. Bowel sounds are normal. He exhibits no distension and no mass. There is no tenderness. There is no rebound and no guarding.  Musculoskeletal: Normal range of  motion. He exhibits edema. He exhibits no tenderness.  1+ pitting edema in ankles.  Neurological: He is alert and oriented to person, place, and time. He has normal strength. He displays no atrophy and no tremor. No cranial nerve deficit or sensory deficit. He exhibits normal muscle tone. GCS eye subscore is 4. GCS verbal subscore is 5. GCS motor subscore is 6.  Gait is baseline with assistance  Skin: Skin is warm and dry.  Psychiatric: He has a normal mood and affect.    ED Course  Procedures (including critical care time)  DIAGNOSTIC STUDIES: Oxygen Saturation is 96% on room air, normal by my interpretation.    COORDINATION OF CARE: 9:31 PM-Discussed treatment plan which includes labs and UA with pt at bedside and pt agreed to plan.     Labs Review Labs Reviewed  CBC WITH DIFFERENTIAL - Abnormal; Notable for the following:    WBC 13.8 (*)    Platelets 124 (*)    Neutrophils Relative % 90 (*)    Neutro Abs 12.4 (*)    Lymphocytes Relative 5 (*)    Lymphs Abs 0.6 (*)    All other components  within normal limits  BASIC METABOLIC PANEL - Abnormal; Notable for the following:    Glucose, Bld 119 (*)    BUN 30 (*)    GFR calc non Af Amer 48 (*)    GFR calc Af Amer 55 (*)    Anion gap 17 (*)    All other components within normal limits  URINALYSIS, ROUTINE W REFLEX MICROSCOPIC - Abnormal; Notable for the following:    Bilirubin Urine SMALL (*)    Ketones, ur 15 (*)    Leukocytes, UA TRACE (*)    All other components within normal limits  URINE CULTURE  URINE MICROSCOPIC-ADD ON    Imaging Review No results found.   EKG Interpretation None      MDM   Final diagnoses:  Dysphagia  Slurred speech  UTI (lower urinary tract infection)  Delirium    Pt is a 80 y.o. male with Pmhx as above who presents with about 7 weeks of intermittent slurred speech and dysphagia for liquids. Daughter also reports 2-3 days of increased confusion and hx of same w/ recurrent UTIs.  He has been seen by PCP and ED for same. On PE, VSS, pt in NAD. Speech slightly slurred. No other focal neuro findings. HEENT exam benign. Symptoms not c/w CVA/TIA. BMP w/ stable Cr. WBC elevated with left shift. UA with trace leukocytes only, however given confusion and elevated WBC, will treat for possible early UTI. Have rec outpt f/u with PCP for consideration for a swallow eval. He will begin using thickener for liquids. Return precautions given for new or worsening symptoms including new focal neuro complaints, inability to tolerate PO, fevers.     I personally performed the services described in this documentation, which was scribed in my presence. The recorded information has been reviewed and is accurate.     Shanna Cisco, MD 03/04/14 [phone]

## 2014-03-03 NOTE — ED Notes (Signed)
Pt is alert and oriented in triage, in nad, family states that he has been evaluated for cva multiple times in past month with significant diagnosis. Pt states that he is having the most difficulty swallowing liquids. Speech clear in triage

## 2014-03-03 NOTE — ED Notes (Signed)
Pt reports that he has had difficulty swallowing since memorial day, since Sunday he has been having more difficulty swallowing family states it is worse is the am and better in the pm, he has been seen by multiple practitioners and given a clean bill of health, family reports that he has not been thinking correctly

## 2014-03-04 LAB — URINE CULTURE
Colony Count: NO GROWTH
Culture: NO GROWTH

## 2014-03-07 ENCOUNTER — Ambulatory Visit (INDEPENDENT_AMBULATORY_CARE_PROVIDER_SITE_OTHER): Payer: Medicare Other | Admitting: Internal Medicine

## 2014-03-07 ENCOUNTER — Encounter: Payer: Self-pay | Admitting: Internal Medicine

## 2014-03-07 VITALS — BP 120/64 | HR 90 | Temp 97.8°F | Resp 18 | Ht 61.5 in | Wt 117.0 lb

## 2014-03-07 DIAGNOSIS — N183 Chronic kidney disease, stage 3 unspecified: Secondary | ICD-10-CM

## 2014-03-07 DIAGNOSIS — I509 Heart failure, unspecified: Secondary | ICD-10-CM

## 2014-03-07 DIAGNOSIS — I5032 Chronic diastolic (congestive) heart failure: Secondary | ICD-10-CM

## 2014-03-07 DIAGNOSIS — Z23 Encounter for immunization: Secondary | ICD-10-CM

## 2014-03-07 DIAGNOSIS — Z0000 Encounter for general adult medical examination without abnormal findings: Secondary | ICD-10-CM

## 2014-03-07 DIAGNOSIS — D500 Iron deficiency anemia secondary to blood loss (chronic): Secondary | ICD-10-CM

## 2014-03-07 DIAGNOSIS — I10 Essential (primary) hypertension: Secondary | ICD-10-CM

## 2014-03-07 NOTE — Progress Notes (Signed)
Subjective:    [name], male    DOB: [birth year], 80 y.o.   MRN: [id number]  HPI  80 year old seen today for a preventive health examination.  He was seen in the ED earlier this week for a UTI.  Today, doing reasonably well.  Accompanied by a daughter  Admit date: 10/17/2013  Discharge date: 10/21/2013   Discharge Diagnoses:  Active Problems:  HYPERTENSION  BENIGN PROSTATIC HYPERTROPHY  Diastolic CHF  Femoral neck fracture  Hip fracture  CKD (chronic kidney disease) stage 3, GFR 30-59 ml/min  Pleural effusion  Past Medical History  Diagnosis Date  . Gout   . Insomnia   . BPH (benign prostatic hyperplasia)   . Kidney disorder   . Arthritis   . Hypertension     patient denies on 10/25/12 on no meds   . Complication of anesthesia     hard to wake up -2011 after hernia surgery   . Anemia   . CHF (congestive heart failure)     History   Social History  . Marital Status: Married    Spouse Name: N/A    Number of Children: N/A  . Years of Education: N/A   Occupational History  . Not on file.   Social History Main Topics  . Smoking status: Never Smoker   . Smokeless tobacco: Never Used  . Alcohol Use: No  . Drug Use: No  . Sexual Activity: No   Other Topics Concern  . Not on file   Social History Narrative  . No narrative on file    Past Surgical History  Procedure Laterality Date  . Cataract extraction    . Cholecystectomy    . Hernia repair  02/22/11    right  . Joint replacement      fractured hip  . Total hip arthroplasty Right 10/29/2012    Procedure: CONVERSION OF PREVIOUS HIP SURGERY TO RIGHT TOTAL HIP AND REMOVAL OF IM NAIL ;  Surgeon: Shelda Pal, MD;  Location: [facility];  Service: Orthopedics;  Laterality: Right;  . Esophagogastroduodenoscopy N/A 11/16/2012    Procedure: ESOPHAGOGASTRODUODENOSCOPY (EGD);  Surgeon: Theda Belfast, MD;  Location: [facility];  Service: Endoscopy;  Laterality: N/A;  . Intramedullary (im) nail  intertrochanteric Left 10/17/2013    Procedure: INTRAMEDULLARY (IM) NAIL INTERTROCHANTRIC;  Surgeon: Shelda Pal, MD;  Location: [facility];  Service: Orthopedics;  Laterality: Left;    Family History  Problem Relation Age of Onset  . Heart failure Mother   . Heart failure Father   . Diabetes Neg Hx     Allergies  Allergen Reactions  . Sertraline Nausea Only  . Nsaids Other (See Comments)    history of renal failure    Current Outpatient Prescriptions on File Prior to Visit  Medication Sig Dispense Refill  . allopurinol (ZYLOPRIM) 100 MG tablet Take 100 mg by mouth daily. For gout.      . ciprofloxacin (CIPRO) 500 MG tablet Take 1 tablet (500 mg total) by mouth 2 (two) times daily. One po bid x 7 days  14 tablet  0  . naphazoline-glycerin (CLEAR EYES) 0.012-0.2 % SOLN Place 1-2 drops into both eyes every 4 (four) hours as needed for irritation.      . silodosin (RAPAFLO) 8 MG CAPS capsule Take 8 mg by mouth daily with breakfast.       No current facility-administered medications on file prior to visit.    BP 120/64  Pulse 90  Temp(Src) 97.8 F (36.6 C) (Oral)  Resp 18  Ht 5' 1.5" (1.562 m)  Wt 117 lb (53.071 kg)  BMI 21.75 kg/m2  SpO2 96%  1. Risk factors, based on past  M,S,F history.  Service factors include age, history of hypertension.  The patient does have a history of chronic diastolic heart failure as well as chronic kidney disease 2.  Physical activities: Limited due to age and arthritis.  Walks with a 4. walker 3.  Depression/mood: No history of depression or mood disorder  4.  Hearing: Only minor deficits compatible with age  32.  ADL's: Requires assistance in all aspects of daily living 6.  Fall risk: High fall risk, requiring assistance and the use of a walker  7.  Home safety: No problems identified  8.  Height weight, and visual acuity; height and weight stable.  No change in visual acuity; vision better from his left eye  9.  Counseling: Heart healthy  diet.  Encouraged 10. Lab orders based on risk factors: Laboratory profile will be reviewed  11. Referral : Not appropriate at this time  12. Care plan: Continue aggressive risk factor modification  13. Cognitive assessment: Alert and appropriate with normal affect.  No cognitive dysfunction     _____________________________________________________________________    Review of Systems  Constitutional: Positive for fatigue. Negative for fever, chills and appetite change.  HENT: Negative for congestion, dental problem, ear pain, hearing loss, sore throat, tinnitus, trouble swallowing and voice change.   Eyes: Negative for pain, discharge and visual disturbance.  Respiratory: Negative for cough, chest tightness, wheezing and stridor.   Cardiovascular: Negative for chest pain, palpitations and leg swelling.  Gastrointestinal: Negative for nausea, vomiting, abdominal pain, diarrhea, constipation, blood in stool and abdominal distention.  Genitourinary: Negative for urgency, hematuria, flank pain, discharge, difficulty urinating and genital sores.  Musculoskeletal: Positive for arthralgias, back pain and gait problem. Negative for joint swelling, myalgias and neck stiffness.  Skin: Negative for rash.  Neurological: Positive for weakness. Negative for dizziness, syncope, speech difficulty, numbness and headaches.  Hematological: Negative for adenopathy. Does not bruise/bleed easily.  Psychiatric/Behavioral: Negative for behavioral problems and dysphoric mood. The patient is not nervous/anxious.        Objective:   Physical Exam  Constitutional: He appears well-developed and well-nourished.  Elderly frail, low normal blood pressure  HENT:  Head: Normocephalic and atraumatic.  Right Ear: External ear normal.  Left Ear: External ear normal.  Nose: Nose normal.  Mouth/Throat: Oropharynx is clear and moist.  Eyes: Conjunctivae and EOM are normal. Pupils are equal, round, and reactive to  light. No scleral icterus.  Neck: Normal range of motion. Neck supple. No JVD present. No thyromegaly present.  Cardiovascular: Regular rhythm, normal heart sounds and intact distal pulses.  Exam reveals no gallop and no friction rub.   No murmur heard. Pedal pulses faint.  Left dorsalis pedis pulse not definite palpable.  No overt ischemic changes  Pulmonary/Chest: Effort normal and breath sounds normal. He exhibits no tenderness.  Kyphosis  Abdominal: Soft. Bowel sounds are normal. He exhibits no distension and no mass. There is no tenderness.  Genitourinary: Penis normal.  Testes atrophic  Musculoskeletal: Normal range of motion. He exhibits no edema and no tenderness.  Lymphadenopathy:    He has no cervical adenopathy.  Neurological: He is alert. He has normal reflexes. No cranial nerve deficit. Coordination normal.  Absent vibratory sensation distally  Skin: Skin is warm and dry. No rash noted.  Onychomycotic  nail changes  Psychiatric: He has a normal mood and affect. His behavior is normal.          Assessment & Plan:   Preventive health examination Chronic kidney disease Hypertension well controlled Chronic diastolic heart failure, compensated Gastroesophageal reflux disease Osteoarthritis  BPH  No change in therapy  Recheck 4 months

## 2014-03-07 NOTE — Progress Notes (Signed)
Pre visit review using our clinic review tool, if applicable. No additional management support is needed unless otherwise documented below in the visit note. 

## 2014-03-07 NOTE — Patient Instructions (Signed)
Limit your sodium (Salt) intake  Return in 3 months for follow-up   

## 2014-03-27 ENCOUNTER — Other Ambulatory Visit: Payer: Self-pay | Admitting: Internal Medicine

## 2014-04-09 ENCOUNTER — Encounter: Payer: Self-pay | Admitting: *Deleted

## 2014-04-10 ENCOUNTER — Ambulatory Visit (INDEPENDENT_AMBULATORY_CARE_PROVIDER_SITE_OTHER): Payer: Medicare Other | Admitting: Nurse Practitioner

## 2014-04-10 ENCOUNTER — Encounter: Payer: Self-pay | Admitting: Nurse Practitioner

## 2014-04-10 VITALS — BP 130/78 | HR 99 | Temp 98.0°F | Resp 10 | Ht 61.0 in | Wt 119.0 lb

## 2014-04-10 DIAGNOSIS — M1990 Unspecified osteoarthritis, unspecified site: Secondary | ICD-10-CM

## 2014-04-10 DIAGNOSIS — N183 Chronic kidney disease, stage 3 unspecified: Secondary | ICD-10-CM

## 2014-04-10 DIAGNOSIS — F329 Major depressive disorder, single episode, unspecified: Secondary | ICD-10-CM

## 2014-04-10 DIAGNOSIS — F32A Depression, unspecified: Secondary | ICD-10-CM

## 2014-04-10 DIAGNOSIS — N400 Enlarged prostate without lower urinary tract symptoms: Secondary | ICD-10-CM

## 2014-04-10 DIAGNOSIS — M129 Arthropathy, unspecified: Secondary | ICD-10-CM

## 2014-04-10 DIAGNOSIS — M109 Gout, unspecified: Secondary | ICD-10-CM

## 2014-04-10 DIAGNOSIS — F3289 Other specified depressive episodes: Secondary | ICD-10-CM

## 2014-04-10 NOTE — Patient Instructions (Signed)
Cont ensures and to increase intake and hydration Better shoes  Tylenol 650 mg twice daily for pain  Follow up in 2 months for extended visit with lab work prior to visit

## 2014-04-10 NOTE — Progress Notes (Signed)
[name], male   DOB: [birth year], 80 y.o.   MRN: [id number]    Allergies  Allergen Reactions  . Sertraline Nausea Only  . Nsaids Other (See Comments)    history of renal failure    Chief Complaint  Patient presents with  . Establish Care    New patient establish care, daughter concerned about depression . Here with [place]  . Arthritis    Bilateral arthritis in knees  . Foot Problem    Examine right foot- raised area x 2 days, no known injury     HPI: Patient is a 80 y.o. male seen in the office today to establish care. Here with daughter [name]. Reports they were previously at family medicine but wanted a geriatric specialty for primary care SPECIALIST Dr Charlann Boxer- Ortho Dr Brunilda Payor- Urologist, conts to follow up with Dr Brunilda Payor- next appt in November  Previously on flomax, however after surgery was needing foley catheter for months, changed to rapaflo which has improved this.  Pt reports 2 days uncomfortable feeling on lateral side of right foot. Looked at it and there are 2 knots - no open area just does not feel good  Taking aleve for his arthritis, daughter reports he took 4 at one time yesterday.  Pt currently lives with wife at home but next month will be moving in with daughter- they are building a new home.   Daughter reports he has signs of depression, screening reveals he has depression and pt agrees. He is not willing to do things to help his health. Does not drink a lot a water (hx of UTI), will not walk or exercise  Does not eat right Previously on sertraline but caused nausea but was not eating well   Review of Systems:  Review of Systems  Constitutional: Positive for weight loss. Negative for fever and chills.  HENT: Negative for ear discharge, ear pain and sore throat.   Eyes: Negative for blurred vision.  Respiratory: Negative for cough, shortness of breath and wheezing.   Cardiovascular: Negative for chest pain and leg swelling.  Gastrointestinal:  Negative for heartburn, abdominal pain, diarrhea and constipation.  Genitourinary: Negative for dysuria, urgency and frequency.  Musculoskeletal: Positive for back pain, joint pain and myalgias.  Skin: Negative.   Neurological: Negative for dizziness, tingling, weakness and headaches.  Psychiatric/Behavioral: Positive for depression.     Past Medical History  Diagnosis Date  . Gout   . Insomnia   . BPH (benign prostatic hyperplasia)   . Kidney disorder   . Arthritis   . Hypertension     patient denies on 10/25/12 on no meds   . Complication of anesthesia     hard to wake up -2011 after hernia surgery   . Anemia   . CHF (congestive heart failure)   . Prostate disorder   . Hernia, abdominal    Past Surgical History  Procedure Laterality Date  . Cataract extraction    . Cholecystectomy    . Hernia repair  02/22/11    right  . Joint replacement      fractured hip  . Total hip arthroplasty Right 10/29/2012    Procedure: CONVERSION OF PREVIOUS HIP SURGERY TO RIGHT TOTAL HIP AND REMOVAL OF IM NAIL ;  Surgeon: Shelda Pal, MD;  Location: [facility];  Service: Orthopedics;  Laterality: Right;  . Esophagogastroduodenoscopy N/A 11/16/2012    Procedure: ESOPHAGOGASTRODUODENOSCOPY (EGD);  Surgeon: Theda Belfast, MD;  Location: [facility];  Service: Endoscopy;  Laterality: N/A;  . Intramedullary (im) nail intertrochanteric Left 10/17/2013    Procedure: INTRAMEDULLARY (IM) NAIL INTERTROCHANTRIC;  Surgeon: Shelda Pal, MD;  Location: [facility];  Service: Orthopedics;  Laterality: Left;   Social History:   reports that he has never smoked. He has never used smokeless tobacco. He reports that he does not drink alcohol or use illicit drugs.  Family History  Problem Relation Age of Onset  . Heart failure Mother     heart  . Heart failure Father     fell and hit head  . Cancer Brother     colon  . Diabetes Daughter     type 2 diabetes    Medications: Patient's Medications  New  Prescriptions   No medications on file  Previous Medications   ALLOPURINOL (ZYLOPRIM) 100 MG TABLET    TAKE 1 TABLET (100 MG TOTAL) BY MOUTH DAILY.   NAPHAZOLINE-GLYCERIN (CLEAR EYES) 0.012-0.2 % SOLN    Place 1-2 drops into both eyes every 4 (four) hours as needed for irritation.   SILODOSIN (RAPAFLO) 8 MG CAPS CAPSULE    Take 8 mg by mouth daily with breakfast.  Modified Medications   No medications on file  Discontinued Medications   ALLOPURINOL (ZYLOPRIM) 100 MG TABLET    Take 100 mg by mouth daily. For gout.   CIPROFLOXACIN (CIPRO) 500 MG TABLET    Take 1 tablet (500 mg total) by mouth 2 (two) times daily. One po bid x 7 days     Physical Exam:  Filed Vitals:   04/10/14 1320  BP: 130/78  Pulse: 99  Temp: 98 F (36.7 C)  TempSrc: Oral  Resp: 10  Height: 5\' 1"  (1.549 m)  Weight: 119 lb (53.978 kg)  SpO2: 97%    Physical Exam  Constitutional: He is oriented to person, place, and time.  Frail appearing male in NAD  HENT:  Nose: Nose normal.  Mouth/Throat: Oropharynx is clear and moist. No oropharyngeal exudate.  Eyes: Conjunctivae and EOM are normal. Pupils are equal, round, and reactive to light.  Neck: Normal range of motion. Neck supple.  Cardiovascular: Normal rate, regular rhythm and normal heart sounds.   Pulmonary/Chest: Effort normal and breath sounds normal. No respiratory distress.  Abdominal: Soft. Bowel sounds are normal.  Musculoskeletal: He exhibits tenderness. He exhibits no edema.  Limited ROM to joints due to pain and arthritis Pain to right laterally foot near 5th toe- bone spur noted where pt reports knot  Neurological: He is alert and oriented to person, place, and time.  Skin: Skin is warm and dry.  Psychiatric: Affect normal.     Labs reviewed: Basic Metabolic Panel:  Recent Labs  16/10/96 0950 10/18/13 0356  10/20/13 0450 12/27/13 0950 03/03/14 2135  NA 141 140  < > 138 142 146  K 4.1 5.6*  < > 4.2 4.3 4.2  CL 109 108  < > 104 106  109  CO2 20 21  < > 22 22 20   GLUCOSE 99 150*  < > 108* 95 119*  BUN 33* 31*  < > 26* 27* 30*  CREATININE 1.37* 1.29  < > 1.15 1.30 1.30  CALCIUM 9.1 8.4  < > 8.2* 9.7 9.7  MG  --  2.0  --   --   --   --   PHOS  --  4.6  --   --   --   --   < > = values in this interval not displayed. Liver  Function Tests:  Recent Labs  09/26/13 1200 10/18/13 0356 12/27/13 0950  AST 38* 21 29  ALT 36 12 12  ALKPHOS 129* 91 104  BILITOT 1.5* 0.6 1.0  PROT 7.2 5.0* 6.6  ALBUMIN 3.6 2.7* 3.8   No results found for this basename: LIPASE, AMYLASE,  in the last 8760 hours No results found for this basename: AMMONIA,  in the last 8760 hours CBC:  Recent Labs  10/17/13 0950  10/20/13 0450 12/27/13 0950 03/03/14 2135  WBC 6.5  < > 6.5 5.8 13.8*  NEUTROABS 5.3  --   --  4.6 12.4*  HGB 11.9*  < > 10.4* 13.6 13.9  HCT 36.1*  < > 30.7* 41.2 41.7  MCV 92.8  < > 92.2 92.6 91.6  PLT 118*  < > 118* 121* 124*  < > = values in this interval not displayed. Lipid Panel: No results found for this basename: CHOL, HDL, LDLCALC, TRIG, CHOLHDL, LDLDIRECT,  in the last 8760 hours TSH: No results found for this basename: TSH,  in the last 8760 hours A1C: Lab Results  Component Value Date   HGBA1C 5.7 11/17/2009     Assessment/Plan 1. Depression -pt admits to depression however did not tolerate medication due to poor PO intake. Plan discussed with pt and daughter to start medication once he has moved in with her and eating better. Also daughter will then be able to assist with medications   2. CKD (chronic kidney disease) stage 3, GFR 30-59 ml/min - stop ALEVE -encourage hydration  - Comprehensive metabolic panel  3. GOUT -conts on allopurinol  - Uric acid  4. BPH (benign prostatic hyperplasia) -doing well with rapaflo, conts to follow with urology    5. Arthritis -ongoing pain in back and knees, gets injections into knees by ortho but has not been back due to hip and rehab, now more active and  walking more- daughter plans to take him  Had annual exam with previous PCP in July, will have pt back in 2 months for extended visit and get MMSE

## 2014-04-15 ENCOUNTER — Other Ambulatory Visit: Payer: Self-pay | Admitting: Nurse Practitioner

## 2014-04-15 DIAGNOSIS — M1A9XX1 Chronic gout, unspecified, with tophus (tophi): Secondary | ICD-10-CM

## 2014-04-30 ENCOUNTER — Other Ambulatory Visit: Payer: Self-pay | Admitting: Internal Medicine

## 2014-05-20 ENCOUNTER — Observation Stay (HOSPITAL_BASED_OUTPATIENT_CLINIC_OR_DEPARTMENT_OTHER)
Admission: EM | Admit: 2014-05-20 | Discharge: 2014-05-22 | Disposition: A | Discharge status: Home / Self Care | Payer: Medicare Other | Attending: Internal Medicine | Admitting: Internal Medicine

## 2014-05-20 ENCOUNTER — Encounter (HOSPITAL_BASED_OUTPATIENT_CLINIC_OR_DEPARTMENT_OTHER): Payer: Self-pay | Admitting: Emergency Medicine

## 2014-05-20 ENCOUNTER — Emergency Department (HOSPITAL_BASED_OUTPATIENT_CLINIC_OR_DEPARTMENT_OTHER): Payer: Medicare Other

## 2014-05-20 DIAGNOSIS — D649 Anemia, unspecified: Secondary | ICD-10-CM | POA: Diagnosis not present

## 2014-05-20 DIAGNOSIS — Z79899 Other long term (current) drug therapy: Secondary | ICD-10-CM | POA: Insufficient documentation

## 2014-05-20 DIAGNOSIS — R0789 Other chest pain: Secondary | ICD-10-CM

## 2014-05-20 DIAGNOSIS — R531 Weakness: Secondary | ICD-10-CM

## 2014-05-20 DIAGNOSIS — Z886 Allergy status to analgesic agent status: Secondary | ICD-10-CM | POA: Insufficient documentation

## 2014-05-20 DIAGNOSIS — N400 Enlarged prostate without lower urinary tract symptoms: Secondary | ICD-10-CM | POA: Insufficient documentation

## 2014-05-20 DIAGNOSIS — K921 Melena: Secondary | ICD-10-CM

## 2014-05-20 DIAGNOSIS — D62 Acute posthemorrhagic anemia: Secondary | ICD-10-CM

## 2014-05-20 DIAGNOSIS — G4700 Insomnia, unspecified: Secondary | ICD-10-CM

## 2014-05-20 DIAGNOSIS — N183 Chronic kidney disease, stage 3 unspecified: Secondary | ICD-10-CM | POA: Diagnosis not present

## 2014-05-20 DIAGNOSIS — I129 Hypertensive chronic kidney disease with stage 1 through stage 4 chronic kidney disease, or unspecified chronic kidney disease: Secondary | ICD-10-CM | POA: Insufficient documentation

## 2014-05-20 DIAGNOSIS — I509 Heart failure, unspecified: Secondary | ICD-10-CM | POA: Diagnosis not present

## 2014-05-20 DIAGNOSIS — F329 Major depressive disorder, single episode, unspecified: Secondary | ICD-10-CM

## 2014-05-20 DIAGNOSIS — M109 Gout, unspecified: Secondary | ICD-10-CM | POA: Insufficient documentation

## 2014-05-20 DIAGNOSIS — J90 Pleural effusion, not elsewhere classified: Principal | ICD-10-CM | POA: Diagnosis present

## 2014-05-20 DIAGNOSIS — G9341 Metabolic encephalopathy: Secondary | ICD-10-CM

## 2014-05-20 DIAGNOSIS — R262 Difficulty in walking, not elsewhere classified: Secondary | ICD-10-CM | POA: Insufficient documentation

## 2014-05-20 DIAGNOSIS — E876 Hypokalemia: Secondary | ICD-10-CM

## 2014-05-20 DIAGNOSIS — R1310 Dysphagia, unspecified: Secondary | ICD-10-CM

## 2014-05-20 DIAGNOSIS — Z888 Allergy status to other drugs, medicaments and biological substances status: Secondary | ICD-10-CM | POA: Insufficient documentation

## 2014-05-20 DIAGNOSIS — E875 Hyperkalemia: Secondary | ICD-10-CM

## 2014-05-20 DIAGNOSIS — Z96649 Presence of unspecified artificial hip joint: Secondary | ICD-10-CM

## 2014-05-20 DIAGNOSIS — N19 Unspecified kidney failure: Secondary | ICD-10-CM

## 2014-05-20 DIAGNOSIS — D72829 Elevated white blood cell count, unspecified: Secondary | ICD-10-CM

## 2014-05-20 DIAGNOSIS — E871 Hypo-osmolality and hyponatremia: Secondary | ICD-10-CM

## 2014-05-20 DIAGNOSIS — N139 Obstructive and reflux uropathy, unspecified: Secondary | ICD-10-CM

## 2014-05-20 DIAGNOSIS — I10 Essential (primary) hypertension: Secondary | ICD-10-CM | POA: Diagnosis present

## 2014-05-20 DIAGNOSIS — I5031 Acute diastolic (congestive) heart failure: Secondary | ICD-10-CM

## 2014-05-20 DIAGNOSIS — R471 Dysarthria and anarthria: Secondary | ICD-10-CM

## 2014-05-20 DIAGNOSIS — F32A Depression, unspecified: Secondary | ICD-10-CM

## 2014-05-20 DIAGNOSIS — R571 Hypovolemic shock: Secondary | ICD-10-CM

## 2014-05-20 LAB — COMPREHENSIVE METABOLIC PANEL
ALT: 15 U/L (ref 0–53)
ANION GAP: 14 (ref 5–15)
AST: 27 U/L (ref 0–37)
Albumin: 3.5 g/dL (ref 3.5–5.2)
Alkaline Phosphatase: 99 U/L (ref 39–117)
BILIRUBIN TOTAL: 0.5 mg/dL (ref 0.3–1.2)
BUN: 26 mg/dL — AB (ref 6–23)
CALCIUM: 9.2 mg/dL (ref 8.4–10.5)
CHLORIDE: 105 meq/L (ref 96–112)
CO2: 22 meq/L (ref 19–32)
CREATININE: 1.3 mg/dL (ref 0.50–1.35)
GFR, EST AFRICAN AMERICAN: 55 mL/min — AB (ref >90)
GFR, EST NON AFRICAN AMERICAN: 48 mL/min — AB (ref >90)
Glucose, Bld: 101 mg/dL — ABNORMAL HIGH (ref 70–99)
Potassium: 4.2 mEq/L (ref 3.7–5.3)
Sodium: 141 mEq/L (ref 137–147)
Total Protein: 6.5 g/dL (ref 6.0–8.3)

## 2014-05-20 LAB — TROPONIN I: Troponin I: <0.3 ng/mL (ref <0.30)

## 2014-05-20 LAB — CBC WITH DIFFERENTIAL/PLATELET
Basophils Absolute: 0 10*3/uL (ref 0.0–0.1)
Basophils Relative: 1 % (ref 0–1)
EOS PCT: 1 % (ref 0–5)
Eosinophils Absolute: 0 10*3/uL (ref 0.0–0.7)
HEMATOCRIT: 36.7 % — AB (ref 39.0–52.0)
HEMOGLOBIN: 12.1 g/dL — AB (ref 13.0–17.0)
LYMPHS PCT: 19 % (ref 12–46)
Lymphs Abs: 1.1 10*3/uL (ref 0.7–4.0)
MCH: 31.1 pg (ref 26.0–34.0)
MCHC: 33 g/dL (ref 30.0–36.0)
MCV: 94.3 fL (ref 78.0–100.0)
MONO ABS: 0.7 10*3/uL (ref 0.1–1.0)
MONOS PCT: 12 % (ref 3–12)
Neutro Abs: 3.8 10*3/uL (ref 1.7–7.7)
Neutrophils Relative %: 68 % (ref 43–77)
Platelets: 110 10*3/uL — ABNORMAL LOW (ref 150–400)
RBC: 3.89 MIL/uL — ABNORMAL LOW (ref 4.22–5.81)
RDW: 14.9 % (ref 11.5–15.5)
WBC: 5.6 10*3/uL (ref 4.0–10.5)

## 2014-05-20 LAB — URINALYSIS, ROUTINE W REFLEX MICROSCOPIC
Bilirubin Urine: NEGATIVE
Glucose, UA: NEGATIVE mg/dL
HGB URINE DIPSTICK: NEGATIVE
Ketones, ur: NEGATIVE mg/dL
Leukocytes, UA: NEGATIVE
Nitrite: NEGATIVE
PH: 5.5 (ref 5.0–8.0)
PROTEIN: NEGATIVE mg/dL
Specific Gravity, Urine: 1.017 (ref 1.005–1.030)
Urobilinogen, UA: 0.2 mg/dL (ref 0.0–1.0)

## 2014-05-20 LAB — PRO B NATRIURETIC PEPTIDE: PRO B NATRI PEPTIDE: 283.4 pg/mL (ref 0–450)

## 2014-05-20 MED ORDER — CETYLPYRIDINIUM CHLORIDE 0.05 % MT LIQD
7.0000 mL | Freq: Two times a day (BID) | OROMUCOSAL | Status: DC
  Administered 2014-05-21 – 2014-05-22 (×3): 7 mL via OROMUCOSAL

## 2014-05-20 MED ORDER — ALLOPURINOL 100 MG PO TABS
100.0000 mg | ORAL_TABLET | Freq: Every day | ORAL | Status: DC
  Administered 2014-05-21 – 2014-05-22 (×2): 100 mg via ORAL
  Filled 2014-05-20 (×2): qty 1

## 2014-05-20 MED ORDER — TAMSULOSIN HCL 0.4 MG PO CAPS
0.4000 mg | ORAL_CAPSULE | Freq: Every day | ORAL | Status: DC
  Filled 2014-05-20: qty 1

## 2014-05-20 MED ORDER — SODIUM CHLORIDE 0.9 % IJ SOLN
3.0000 mL | Freq: Two times a day (BID) | INTRAMUSCULAR | Status: DC
  Administered 2014-05-21 – 2014-05-22 (×4): 3 mL via INTRAVENOUS

## 2014-05-20 MED ORDER — NAPHAZOLINE HCL 0.1 % OP SOLN
1.0000 [drp] | Freq: Four times a day (QID) | OPHTHALMIC | Status: DC | PRN
  Filled 2014-05-20: qty 15

## 2014-05-20 MED ORDER — SERTRALINE HCL 25 MG PO TABS
25.0000 mg | ORAL_TABLET | Freq: Every day | ORAL | Status: DC
  Filled 2014-05-20 (×2): qty 1

## 2014-05-20 NOTE — Plan of Care (Signed)
[age] yo M with worsening R sided pleural effusion.  Has had this since Feb this year but work up hasnt determined the cause of it.  ?CHF?  Was mostly stable throughout the year but having increased confusion recently per family over past week or so as well as leg swelling.  Is SOB on exam in ED, effusion is much larger.  Call pulm about getting this drained tomorrow.

## 2014-05-20 NOTE — H&P (Signed)
[facility] History and Physical  [name] BJY:[id number] DOB: 12/15/1924 DOA: 05/20/2014  Referring physician: EDP PCP: [name], NP   Chief Complaint: Edema   HPI: [name] is a 80 y.o. male who presents to the ED at [facility] with multiple complaints.  Complaints include BLE edema that has been going on for the past few days as well as mild confusion and SOB over the past 24-48 hours per family.  Patient states that "I don't think im that short of breath but I guess everyone else does".  He has a history of CHF and CKD, he also has a history of pleural effusion first described on admission in march of this year, which was stable but has now enlarged on CXR today in the ED.  After work up, they concluded that the pleural effusion was likely due to diastolic CHF as well as CKD, cytology was negative for malignancy back in March.  CXR in ED shows significant enlargement of the R pleural effusion.  Review of Systems: Systems reviewed.  As above, otherwise negative  Past Medical History  Diagnosis Date  . Gout   . Insomnia   . BPH (benign prostatic hyperplasia)   . Kidney disorder   . Arthritis   . Hypertension     patient denies on 10/25/12 on no meds   . Complication of anesthesia     hard to wake up -2011 after hernia surgery   . Anemia   . CHF (congestive heart failure)   . Prostate disorder   . Hernia, abdominal    Past Surgical History  Procedure Laterality Date  . Cataract extraction    . Cholecystectomy    . Hernia repair  02/22/11    right  . Joint replacement      fractured hip  . Total hip arthroplasty Right 10/29/2012    Procedure: CONVERSION OF PREVIOUS HIP SURGERY TO RIGHT TOTAL HIP AND REMOVAL OF IM NAIL ;  Surgeon: Shelda Pal, MD;  Location: [facility];  Service: Orthopedics;  Laterality: Right;  . Esophagogastroduodenoscopy N/A 11/16/2012    Procedure: ESOPHAGOGASTRODUODENOSCOPY (EGD);  Surgeon: Theda Belfast, MD;  Location: [facility];  Service: Endoscopy;  Laterality: N/A;  . Intramedullary (im) nail intertrochanteric Left 10/17/2013    Procedure: INTRAMEDULLARY (IM) NAIL INTERTROCHANTRIC;  Surgeon: Shelda Pal, MD;  Location: [facility];  Service: Orthopedics;  Laterality: Left;   Social History:  reports that he has never smoked. He has never used smokeless tobacco. He reports that he does not drink alcohol or use illicit drugs.  Allergies  Allergen Reactions  . Sertraline Nausea Only  . Nsaids Other (See Comments)    history of renal failure    Family History  Problem Relation Age of Onset  . Heart failure Mother     heart  . Heart failure Father     fell and hit head  . Cancer Brother     colon  . Diabetes Daughter      Prior to Admission medications   Medication Sig Start Date End Date Taking? Authorizing Provider  allopurinol (ZYLOPRIM) 100 MG tablet TAKE 1 TABLET (100 MG TOTAL) BY MOUTH DAILY. 03/27/14   Gordy Savers, MD  naphazoline-glycerin (CLEAR EYES) 0.012-0.2 % SOLN Place 1-2 drops into both eyes every 4 (four) hours as needed for irritation.    Historical Provider, MD  sertraline (ZOLOFT) 25 MG tablet TAKE 1 TABLET (25 MG TOTAL) BY MOUTH DAILY. 04/30/14  Gordy SaversPeter F Kwiatkowski, MD  silodosin (RAPAFLO) 8 MG CAPS capsule Take 8 mg by mouth daily with breakfast.    Historical Provider, MD   Physical Exam: Filed Vitals:   05/20/14 2259  BP: 154/63  Pulse: 89  Temp: 98.1 F (36.7 C)  Resp: 18    BP 154/63  Pulse 89  Temp(Src) 98.1 F (36.7 C) (Oral)  Resp 18  Ht 5\' 1"  (1.549 m)  Wt 64.5 kg (142 lb 3.2 oz)  BMI 26.88 kg/m2  SpO2 100%  General Appearance:    Alert, oriented, no distress, does appear short of breath needing multiple breaths to complete sentences, appears stated age  Head:    Normocephalic, atraumatic  Eyes:    PERRL, EOMI, sclera non-icteric        Nose:   Nares without drainage or epistaxis. Mucosa, turbinates normal  Throat:   Moist mucous membranes.  Oropharynx without erythema or exudate.  Neck:   Supple. No carotid bruits.  No thyromegaly.  No lymphadenopathy.   Back:     No CVA tenderness, no spinal tenderness  Lungs:     Diminished over RML and R base.  Chest wall:    No tenderness to palpitation  Heart:    Regular rate and rhythm without murmurs, gallops, rubs  Abdomen:     Soft, non-tender, nondistended, normal bowel sounds, no organomegaly  Genitalia:    deferred  Rectal:    deferred  Extremities:   No clubbing, cyanosis or edema.  Pulses:   2+ and symmetric all extremities  Skin:   Skin color, texture, turgor normal, no rashes or lesions  Lymph nodes:   Cervical, supraclavicular, and axillary nodes normal  Neurologic:   CNII-XII intact. Normal strength, sensation and reflexes      throughout    Labs on Admission:  Basic Metabolic Panel:  Recent Labs Lab 05/20/14 1728  NA 141  K 4.2  CL 105  CO2 22  GLUCOSE 101*  BUN 26*  CREATININE 1.30  CALCIUM 9.2   Liver Function Tests:  Recent Labs Lab 05/20/14 1728  AST 27  ALT 15  ALKPHOS 99  BILITOT 0.5  PROT 6.5  ALBUMIN 3.5   No results found for this basename: LIPASE, AMYLASE,  in the last 168 hours No results found for this basename: AMMONIA,  in the last 168 hours CBC:  Recent Labs Lab 05/20/14 1728  WBC 5.6  NEUTROABS 3.8  HGB 12.1*  HCT 36.7*  MCV 94.3  PLT 110*   Cardiac Enzymes:  Recent Labs Lab 05/20/14 1728  TROPONINI <0.30    BNP (last 3 results)  Recent Labs  10/17/13 0950 12/27/13 0950 05/20/14 1728  PROBNP 214.5 347.2 283.4   CBG: No results found for this basename: GLUCAP,  in the last 168 hours  Radiological Exams on Admission: Dg Chest 2 View  05/20/2014   CLINICAL DATA:  Slurred speech with bilateral ankle swelling for several days.  EXAM: CHEST  2 VIEW  COMPARISON:  Radiographs 12/27/2013 and 12/12/2013.  FINDINGS: 1745 hr. Right pleural effusion has enlarged, now large. There is associated worsening aeration of the  right lung base. There is mild left basilar atelectasis and no definite left pleural effusion. The heart size and mediastinal contours are stable.  There is chronic obliteration of the subacromial space of both shoulders consistent with chronic rotator cuff tears. Compression deformities are again noted at the thoracolumbar junction. These appear somewhat progressive, especially the superior one, although are suboptimally visualized.  IMPRESSION: Enlarging right pleural effusion, now large, with worsening right basilar aeration.   Electronically Signed   By: Roxy Horseman M.D.   On: 05/20/2014 18:01   Ct Head Wo Contrast  05/20/2014   CLINICAL DATA:  80 year old male with slurred speech 2 days ago. Hypertension. Initial encounter.  EXAM: CT HEAD WITHOUT CONTRAST  TECHNIQUE: Contiguous axial images were obtained from the base of the skull through the vertex without intravenous contrast.  COMPARISON:  12/27/2013.  FINDINGS: No intracranial hemorrhage.  Small vessel disease type changes without CT evidence of large acute infarct.  Global mild age related atrophy without hydrocephalus.  No intracranial mass lesion noted on this unenhanced exam.  Vascular calcifications.  No calvarial abnormality.  IMPRESSION: No intracranial hemorrhage or CT evidence of large acute infarct.   Electronically Signed   By: Bridgett Larsson M.D.   On: 05/20/2014 18:04    EKG: Independently reviewed.  Assessment/Plan Active Problems:   Pleural effusion, right   1. R pleural effusion - 1. Likely due to CHF and CKD, however given the recurrence pulmonology consult is likely warranted in this patient. 2. Have ordered IR thoracentesis for therapeutic drainage of pleural fluid 3. Have ordered cell count, culture, LDH, glucose, protein, and repeat cytology on the pleural fluid 4. Patient NPO for expected procedure tomorrow AM 5. Patient on SCDs only for DVT ppx for expected procedure.     Code Status: Full Code  Family  Communication: No family in room Disposition Plan: Admit to inpatient   Time spent: 70 min  GARDNER, JARED M. [facility] Pager [phone]  If 7AM-7PM, please contact the day team taking care of the patient [email or web address] Password [facility] 05/20/2014, 11:35 PM

## 2014-05-20 NOTE — ED Notes (Signed)
Daughter noticed his ankles were swollen yesterday. He has blood under his skin in an area about 6" to his left lower posterior leg. He does not know how it got there. His daughter also thinks he may have a UTI.

## 2014-05-20 NOTE — ED Provider Notes (Addendum)
CSN: [id number]     Arrival date & time 05/20/14  1636 History   First MD Initiated Contact with Patient 05/20/14 1656     Chief Complaint  Patient presents with  . Ankles swelling      (Consider location/radiation/quality/duration/timing/severity/associated sxs/prior Treatment) HPI 80 year old male presents with multiple complaints. Primary complaint is bilateral leg swelling. He states his been going on for the past few days. His daughter and wife noticed it today and wanted him to get checked out. He denies any shortness of breath orthopnea. Denies any chest pain. He denies a history of CHF but his medical history in the chart endorses CHF. Patient denies any injuries to his leg but is developed ecchymosis to his left lower leg is not painful. No fevers or chills or cough. He's been having dysuria at the beginning of urination for the past one week and so is wanting to be checked out for a UTI. His wife also endorses that he's had some slurred speech over the last 2 days. He's not having trouble getting correct words out but his words are slurred. Patient states he feels like his mouth is dry if he had multiple cups of water his speech would be back to normal. There's been no weakness or numbness. No facial droop.  Past Medical History  Diagnosis Date  . Gout   . Insomnia   . BPH (benign prostatic hyperplasia)   . Kidney disorder   . Arthritis   . Hypertension     patient denies on 10/25/12 on no meds   . Complication of anesthesia     hard to wake up -2011 after hernia surgery   . Anemia   . CHF (congestive heart failure)   . Prostate disorder   . Hernia, abdominal    Past Surgical History  Procedure Laterality Date  . Cataract extraction    . Cholecystectomy    . Hernia repair  02/22/11    right  . Joint replacement      fractured hip  . Total hip arthroplasty Right 10/29/2012    Procedure: CONVERSION OF PREVIOUS HIP SURGERY TO RIGHT TOTAL HIP AND REMOVAL OF IM NAIL ;  Surgeon:  Shelda Pal, MD;  Location: [facility];  Service: Orthopedics;  Laterality: Right;  . Esophagogastroduodenoscopy N/A 11/16/2012    Procedure: ESOPHAGOGASTRODUODENOSCOPY (EGD);  Surgeon: Theda Belfast, MD;  Location: [facility];  Service: Endoscopy;  Laterality: N/A;  . Intramedullary (im) nail intertrochanteric Left 10/17/2013    Procedure: INTRAMEDULLARY (IM) NAIL INTERTROCHANTRIC;  Surgeon: Shelda Pal, MD;  Location: [facility];  Service: Orthopedics;  Laterality: Left;   Family History  Problem Relation Age of Onset  . Heart failure Mother     heart  . Heart failure Father     fell and hit head  . Cancer Brother     colon  . Diabetes Daughter    History  Substance Use Topics  . Smoking status: Never Smoker   . Smokeless tobacco: Never Used  . Alcohol Use: No    Review of Systems  Constitutional: Negative for fever.  Respiratory: Negative for cough and shortness of breath.   Cardiovascular: Positive for leg swelling. Negative for chest pain.  Gastrointestinal: Negative for vomiting and abdominal pain.  Genitourinary: Positive for dysuria. Negative for hematuria and discharge.  Neurological: Positive for speech difficulty. Negative for weakness, numbness and headaches.  All other systems reviewed and are negative.     Allergies  Sertraline and Nsaids  Home Medications   Prior to Admission medications   Medication Sig Start Date End Date Taking? Authorizing Provider  allopurinol (ZYLOPRIM) 100 MG tablet TAKE 1 TABLET (100 MG TOTAL) BY MOUTH DAILY. 03/27/14   Gordy SaversPeter F Kwiatkowski, MD  naphazoline-glycerin (CLEAR EYES) 0.012-0.2 % SOLN Place 1-2 drops into both eyes every 4 (four) hours as needed for irritation.    Historical Provider, MD  sertraline (ZOLOFT) 25 MG tablet TAKE 1 TABLET (25 MG TOTAL) BY MOUTH DAILY. 04/30/14   Gordy SaversPeter F Kwiatkowski, MD  silodosin (RAPAFLO) 8 MG CAPS capsule Take 8 mg by mouth daily with breakfast.    Historical Provider, MD   BP 133/71  Pulse 87   Temp(Src) 97.8 F (36.6 C) (Oral)  Resp 18  Wt 119 lb (53.978 kg)  SpO2 95% Physical Exam  Nursing note and vitals reviewed. Constitutional: He is oriented to person, place, and time. He appears well-developed and well-nourished. No distress.  HENT:  Head: Normocephalic and atraumatic.  Right Ear: External ear normal.  Left Ear: External ear normal.  Nose: Nose normal.  Mildly dry mucous membranes  Eyes: EOM are normal. Pupils are equal, round, and reactive to light. Right eye exhibits no discharge. Left eye exhibits no discharge.  Neck: Neck supple.  Cardiovascular: Normal rate, regular rhythm, normal heart sounds and intact distal pulses.   Pulmonary/Chest: Tachypnea (mild) noted. He has decreased breath sounds in the right middle field and the right lower field.  Abdominal: Soft. He exhibits no distension. There is no tenderness.  Musculoskeletal: He exhibits edema (bilateral pitting edema to mid lower leg). He exhibits no tenderness.       Legs: Neurological: He is alert and oriented to person, place, and time.  CN 2-12 grossly intact. 5/5 strength in all 4 extremities. Mildly slurred speech, no facial droop  Skin: Skin is warm and dry.    ED Course  Procedures (including critical care time) Labs Review Labs Reviewed  CBC WITH DIFFERENTIAL - Abnormal; Notable for the following:    RBC 3.89 (*)    Hemoglobin 12.1 (*)    HCT 36.7 (*)    Platelets 110 (*)    All other components within normal limits  COMPREHENSIVE METABOLIC PANEL - Abnormal; Notable for the following:    Glucose, Bld 101 (*)    BUN 26 (*)    GFR calc non Af Amer 48 (*)    GFR calc Af Amer 55 (*)    All other components within normal limits  URINALYSIS, ROUTINE W REFLEX MICROSCOPIC  TROPONIN I  PRO B NATRIURETIC PEPTIDE    Imaging Review Dg Chest 2 View  05/20/2014   CLINICAL DATA:  Slurred speech with bilateral ankle swelling for several days.  EXAM: CHEST  2 VIEW  COMPARISON:  Radiographs  12/27/2013 and 12/12/2013.  FINDINGS: 1745 hr. Right pleural effusion has enlarged, now large. There is associated worsening aeration of the right lung base. There is mild left basilar atelectasis and no definite left pleural effusion. The heart size and mediastinal contours are stable.  There is chronic obliteration of the subacromial space of both shoulders consistent with chronic rotator cuff tears. Compression deformities are again noted at the thoracolumbar junction. These appear somewhat progressive, especially the superior one, although are suboptimally visualized.  IMPRESSION: Enlarging right pleural effusion, now large, with worsening right basilar aeration.   Electronically Signed   By: Roxy HorsemanBill  Veazey M.D.   On: 05/20/2014 18:01   Ct Head Wo Contrast  05/20/2014  CLINICAL DATA:  80 year old male with slurred speech 2 days ago. Hypertension. Initial encounter.  EXAM: CT HEAD WITHOUT CONTRAST  TECHNIQUE: Contiguous axial images were obtained from the base of the skull through the vertex without intravenous contrast.  COMPARISON:  12/27/2013.  FINDINGS: No intracranial hemorrhage.  Small vessel disease type changes without CT evidence of large acute infarct.  Global mild age related atrophy without hydrocephalus.  No intracranial mass lesion noted on this unenhanced exam.  Vascular calcifications.  No calvarial abnormality.  IMPRESSION: No intracranial hemorrhage or CT evidence of large acute infarct.   Electronically Signed   By: Bridgett Larsson M.D.   On: 05/20/2014 18:04     EKG Interpretation   Date/Time:  Tuesday May 20 2014 17:37:10 EDT Ventricular Rate:  83 PR Interval:  124 QRS Duration: 74 QT Interval:  380 QTC Calculation: 446 R Axis:   14 Text Interpretation:  Normal sinus rhythm Normal ECG No significant change  since last tracing Confirmed by Jonai Weyland  MD, Merrel Crabbe ([id number]) on 05/20/2014  9:55:38 PM       MDM   Final diagnoses:  Pleural effusion, right    Patient with  bilateral lower extremity swelling of uncertain etiology. He does have CHF but his BNP is unimpressive. There is no asymmetry to his leg swelling to suggest DVT or concern for PE. He denies being short of breath but his daughter states she has no seeming significantly more short of breath over the last 24-48 hours. Very short of breath with minimal exertion. Chest x-ray shows significantly larger pleural effusion than in the past. He has had this drained in the past. He is not hypoxic but given his increased work of breathing while at rest I feel he would benefit from drainage. Will need admission and drainage in tomorrow. Astra slurred speech daughter endorse that this happens every time he seems to have a UTI. He does not have a UTI now and his CT head is normal. Is not any other neurologic symptoms.    Audree Camel, MD 05/20/14 2116  Audree Camel, MD 05/20/14 2155

## 2014-05-20 NOTE — ED Notes (Signed)
[place] [facility] to notified of request for ALS transport, per dispatcher, truck available and to be dispatched for non emergent transport

## 2014-05-21 ENCOUNTER — Inpatient Hospital Stay (HOSPITAL_COMMUNITY): Payer: Medicare Other

## 2014-05-21 DIAGNOSIS — I10 Essential (primary) hypertension: Secondary | ICD-10-CM

## 2014-05-21 DIAGNOSIS — M79609 Pain in unspecified limb: Secondary | ICD-10-CM

## 2014-05-21 DIAGNOSIS — N183 Chronic kidney disease, stage 3 unspecified: Secondary | ICD-10-CM

## 2014-05-21 DIAGNOSIS — J90 Pleural effusion, not elsewhere classified: Secondary | ICD-10-CM | POA: Diagnosis not present

## 2014-05-21 DIAGNOSIS — I509 Heart failure, unspecified: Secondary | ICD-10-CM

## 2014-05-21 DIAGNOSIS — R609 Edema, unspecified: Secondary | ICD-10-CM

## 2014-05-21 DIAGNOSIS — R1310 Dysphagia, unspecified: Secondary | ICD-10-CM

## 2014-05-21 DIAGNOSIS — R471 Dysarthria and anarthria: Secondary | ICD-10-CM

## 2014-05-21 LAB — BASIC METABOLIC PANEL
Anion gap: 11 (ref 5–15)
BUN: 22 mg/dL (ref 6–23)
CO2: 24 mEq/L (ref 19–32)
CREATININE: 1.26 mg/dL (ref 0.50–1.35)
Calcium: 8.9 mg/dL (ref 8.4–10.5)
Chloride: 106 mEq/L (ref 96–112)
GFR calc Af Amer: 57 mL/min — ABNORMAL LOW (ref >90)
GFR, EST NON AFRICAN AMERICAN: 49 mL/min — AB (ref >90)
GLUCOSE: 89 mg/dL (ref 70–99)
POTASSIUM: 4.1 meq/L (ref 3.7–5.3)
Sodium: 141 mEq/L (ref 137–147)

## 2014-05-21 LAB — CBC
HEMATOCRIT: 36 % — AB (ref 39.0–52.0)
HEMOGLOBIN: 11.9 g/dL — AB (ref 13.0–17.0)
MCH: 30.7 pg (ref 26.0–34.0)
MCHC: 33.1 g/dL (ref 30.0–36.0)
MCV: 93 fL (ref 78.0–100.0)
Platelets: 115 10*3/uL — ABNORMAL LOW (ref 150–400)
RBC: 3.87 MIL/uL — ABNORMAL LOW (ref 4.22–5.81)
RDW: 15 % (ref 11.5–15.5)
WBC: 4.4 10*3/uL (ref 4.0–10.5)

## 2014-05-21 LAB — LACTATE DEHYDROGENASE, PLEURAL OR PERITONEAL FLUID: LD, Fluid: 105 U/L — ABNORMAL HIGH (ref 3–23)

## 2014-05-21 LAB — PROTEIN, BODY FLUID: Total protein, fluid: 4.1 g/dL

## 2014-05-21 LAB — GLUCOSE, SEROUS FLUID: GLUCOSE FL: 89 mg/dL

## 2014-05-21 LAB — HEPATIC FUNCTION PANEL
ALBUMIN: 3.3 g/dL — AB (ref 3.5–5.2)
ALK PHOS: 103 U/L (ref 39–117)
ALT: 13 U/L (ref 0–53)
AST: 25 U/L (ref 0–37)
BILIRUBIN INDIRECT: 0.7 mg/dL (ref 0.3–0.9)
Bilirubin, Direct: 0.2 mg/dL (ref 0.0–0.3)
Total Bilirubin: 0.9 mg/dL (ref 0.3–1.2)
Total Protein: 6 g/dL (ref 6.0–8.3)

## 2014-05-21 LAB — BODY FLUID CELL COUNT WITH DIFFERENTIAL
Lymphs, Fluid: 27 %
MONOCYTE-MACROPHAGE-SEROUS FLUID: 65 % (ref 50–90)
Neutrophil Count, Fluid: 8 % (ref 0–25)
Total Nucleated Cell Count, Fluid: 99 cu mm (ref 0–1000)

## 2014-05-21 LAB — LACTATE DEHYDROGENASE: LDH: 215 U/L (ref 94–250)

## 2014-05-21 MED ORDER — TAMSULOSIN HCL 0.4 MG PO CAPS
0.4000 mg | ORAL_CAPSULE | Freq: Two times a day (BID) | ORAL | Status: DC
Start: 2014-05-21 — End: 2014-05-22
  Administered 2014-05-21 – 2014-05-22 (×3): 0.4 mg via ORAL
  Filled 2014-05-21 (×4): qty 1

## 2014-05-21 MED ORDER — BOOST PLUS PO LIQD
237.0000 mL | Freq: Two times a day (BID) | ORAL | Status: DC
  Administered 2014-05-22: 237 mL via ORAL
  Filled 2014-05-21 (×2): qty 237

## 2014-05-21 NOTE — Progress Notes (Signed)
INITIAL NUTRITION ASSESSMENT  DOCUMENTATION CODES Per approved criteria  -Not Applicable   INTERVENTION: -Recommend Boost Plus BID, each supplement provides 360 kcal, 14 gram protein -Encouraged intake of healthy snacks for additional assistance with weight maintenance -Provided nutrition supplement coupons -RD to continue to monitor  NUTRITION DIAGNOSIS: Unintentional weight loss related to inadequate energy intake as evidenced by 15 lb weight loss in 6 months.   Goal: Pt to meet >/= 90% of their estimated nutrition needs    Monitor:  Total protein/energy intake, labs, weights, skin integrity, education needs  Reason for Assessment: MST  80 y.o. male  Admitting Dx: <principal problem not specified>  ASSESSMENT: [name] is a 80 y.o. male who presents to the ED at [facility] with multiple complaints. Complaints include BLE edema that has been going on for the past few days as well as mild confusion and SOB over the past 24-48 hours per family  -Pt reported decreased appetite and weight loss that has occurred post fall, for approximately 6 months -Wife reported usual body weight around 160 lbs, but was recently weighed at 120. Current weight likely elevated r/t to RLE edemaPrevious medical records indicate pt weighed 135 lb six month ago, indicating a 15 lb weight loss (10% body weight loss, significant for time frame).  -Diet recall indicate pt consumes light breakfast of cereal, snacks for lunch, and has balanced meal of protein/starch/vegetable for dinner. Drinks Boost High Protein once daily -Encouraged pt to try to consume supplement BID, provided pt and family with nutrition supplement coupons -Pt reported snacking on sweets and cookies. Reviewed heart healthy snacks to incorporate into diet that included a lean protein for muscle maintenance -Was NPO for thoracentesis, diet advanced to Heart Healthy post procedure. Current PO intake 60%  Height: Ht Readings from Last 1  Encounters:  05/20/14 5\' 1"  (1.549 m)    Weight: Wt Readings from Last 1 Encounters:  05/20/14 142 lb 3.2 oz (64.5 kg)    Ideal Body Weight: 112 lbs  % Ideal Body Weight: 107% (120 lb used)  Wt Readings from Last 10 Encounters:  05/20/14 142 lb 3.2 oz (64.5 kg)  04/10/14 119 lb (53.978 kg)  03/07/14 117 lb (53.071 kg)  03/03/14 121 lb (54.885 kg)  02/18/14 121 lb (54.885 kg)  12/27/13 126 lb (57.153 kg)  12/19/13 128 lb (58.06 kg)  11/20/13 126 lb (57.153 kg)  11/07/13 128 lb 6.4 oz (58.242 kg)  10/21/13 134 lb 11.2 oz (61.1 kg)    Usual Body Weight: 135 lb six month ago, 160 lb pre-fall  % Usual Body Weight: 75%  BMI:  Body mass index is 26.88 kg/(m^2).  Estimated Nutritional Needs: Kcal: 1650-1850 Protein: 70-80 gram Fluid: >/=1700 ml/daily  Skin: stage 2 PU on sacrum  Diet Order: Cardiac  EDUCATION NEEDS: -Education needs addressed   Intake/Output Summary (Last 24 hours) at 05/21/14 1702 Last data filed at 05/21/14 1248  Gross per 24 hour  Intake    126 ml  Output    150 ml  Net    -24 ml    Last BM: 9/29   Labs:   Recent Labs Lab 05/20/14 1728 05/21/14 0432  NA 141 141  K 4.2 4.1  CL 105 106  CO2 22 24  BUN 26* 22  CREATININE 1.30 1.26  CALCIUM 9.2 8.9  GLUCOSE 101* 89    CBG (last 3)  No results found for this basename: GLUCAP,  in the last 72 hours  Scheduled Meds: .  allopurinol  100 mg Oral Daily  . antiseptic oral rinse  7 mL Mouth Rinse BID  . [START ON 05/22/2014] lactose free nutrition  237 mL Oral BID BM  . sertraline  25 mg Oral Daily  . sodium chloride  3 mL Intravenous Q12H  . tamsulosin  0.4 mg Oral BID    Continuous Infusions:   Past Medical History  Diagnosis Date  . Gout   . Insomnia   . BPH (benign prostatic hyperplasia)   . Kidney disorder   . Arthritis   . Hypertension     patient denies on 10/25/12 on no meds   . Complication of anesthesia     hard to wake up -2011 after hernia surgery   . Anemia   .  CHF (congestive heart failure)   . Prostate disorder   . Hernia, abdominal     Past Surgical History  Procedure Laterality Date  . Cataract extraction    . Cholecystectomy    . Hernia repair  02/22/11    right  . Joint replacement      fractured hip  . Total hip arthroplasty Right 10/29/2012    Procedure: CONVERSION OF PREVIOUS HIP SURGERY TO RIGHT TOTAL HIP AND REMOVAL OF IM NAIL ;  Surgeon: Shelda PalMatthew D Olin, MD;  Location: [facility];  Service: Orthopedics;  Laterality: Right;  . Esophagogastroduodenoscopy N/A 11/16/2012    Procedure: ESOPHAGOGASTRODUODENOSCOPY (EGD);  Surgeon: Theda BelfastPatrick D Hung, MD;  [facility];  Service: Endoscopy;  Laterality: N/A;  . Intramedullary (im) nail intertrochanteric Left 10/17/2013    Procedure: INTRAMEDULLARY (IM) NAIL INTERTROCHANTRIC;  Surgeon: Shelda PalMatthew D Olin, MD;  Location: [facility];  Service: Orthopedics;  Laterality: Left;    Lloyd HugerSarah F Aadya Kindler MS RD LDN Clinical Dietitian Pager:[phone]

## 2014-05-21 NOTE — Procedures (Signed)
Successful US guided right thoracentesis. Yielded 1.2 liters of clear yellow fluid. Pt tolerated procedure well. No immediate complications.  Specimen was sent for labs. CXR ordered.  Pattricia BossMORGAN, Ivori Storr D PA-C 05/21/2014 11:37 AM

## 2014-05-21 NOTE — Progress Notes (Signed)
Bilateral lower extremity venous duplex completed:  No evidence of DVT, superficial thrombosis, or Baker's cyst.   

## 2014-05-21 NOTE — Progress Notes (Signed)
PROGRESS NOTE  Audrie Gallusarl L Embry ZOX:[id number] DOB: [birth year] DOA: 05/20/2014 PCP: Sharon SellerEUBANKS, JESSICA K, NP  Interim summary 80 year old male with a history of hypertension, CKD stage III,diastolic CHF, BPH presents with two-day history of mild confusion and dysarthria. The patient is unable to provide any history as he is a poor historian. The patient's daughter also relates three-day history of increasing lower extremity edema which is fairly new. This is associated with bilateral lower extremity weakness without any mechanical fall. The patient normally uses a walker for assistance. Upon evaluation in the emergency department, the patient was found to have a worsening right-sided pleural effusion. He was placed on supplemental oxygen. Assessment/Plan: Dysarthria -05/20/2014 CT brain negative -MRI brain without contrast -Patient's daughter states that this has improved today Right-sided pleural effusion -Worsened when compared to February 2015 -Patient had exudative effusion Feb 2015 with negative cytology -Repeat thoracocentesis with LDH and protein -will send pleural fluid for cell count, culture, ANA -Family wants to await results of thoracentesis before pursuing other diagnostic options Lower extremity edema -Venous duplex r/o DVT HTN -not on meds at home -stable off of meds presently BPH -continue Flomax CKD stage 3 -baseline creatinine 1-1.4 -stable  Family Communication:   Daughter and wife at beside Disposition Plan:   Home when medically stable       Procedures/Studies: Dg Chest 2 View  05/20/2014   CLINICAL DATA:  Slurred speech with bilateral ankle swelling for several days.  EXAM: CHEST  2 VIEW  COMPARISON:  Radiographs 12/27/2013 and 12/12/2013.  FINDINGS: 1745 hr. Right pleural effusion has enlarged, now large. There is associated worsening aeration of the right lung base. There is mild left basilar atelectasis and no definite left pleural effusion. The  heart size and mediastinal contours are stable.  There is chronic obliteration of the subacromial space of both shoulders consistent with chronic rotator cuff tears. Compression deformities are again noted at the thoracolumbar junction. These appear somewhat progressive, especially the superior one, although are suboptimally visualized.  IMPRESSION: Enlarging right pleural effusion, now large, with worsening right basilar aeration.   Electronically Signed   By: Roxy HorsemanBill  Veazey M.D.   On: 05/20/2014 18:01   Ct Head Wo Contrast  05/20/2014   CLINICAL DATA:  80 year old male with slurred speech 2 days ago. Hypertension. Initial encounter.  EXAM: CT HEAD WITHOUT CONTRAST  TECHNIQUE: Contiguous axial images were obtained from the base of the skull through the vertex without intravenous contrast.  COMPARISON:  12/27/2013.  FINDINGS: No intracranial hemorrhage.  Small vessel disease type changes without CT evidence of large acute infarct.  Global mild age related atrophy without hydrocephalus.  No intracranial mass lesion noted on this unenhanced exam.  Vascular calcifications.  No calvarial abnormality.  IMPRESSION: No intracranial hemorrhage or CT evidence of large acute infarct.   Electronically Signed   By: Bridgett LarssonSteve  Olson M.D.   On: 05/20/2014 18:04         Subjective: Patient denies fevers, chills, headache, chest pain, dyspnea, nausea, vomiting, diarrhea, abdominal pain, dysuria, hematuria   Objective: Filed Vitals:   05/20/14 1921 05/20/14 2144 05/20/14 2259 05/21/14 0559  BP: 160/83 106/62 154/63 154/75  Pulse: 88 92 89 84  Temp:  97.9 F (36.6 C) 98.1 F (36.7 C) 97.7 F (36.5 C)  TempSrc:  Oral Oral Oral  Resp: 18 18 18 20   Height:   5\' 1"  (1.549 m)   Weight:   64.5 kg (142 lb  3.2 oz)   SpO2: 96% 94% 100% 97%    Intake/Output Summary (Last 24 hours) at 05/21/14 0956 Last data filed at 05/21/14 0500  Gross per 24 hour  Intake      3 ml  Output    150 ml  Net   -147 ml   Weight  change:  Exam:   General:  Pt is alert, follows commands appropriately, not in acute distress  HEENT: No icterus, No thrush, No neck mass, [place]/AT  Cardiovascular: RRR, S1/S2, no rubs, no gallops  Respiratory: L-CTA, R-diminished BS, no wheeze  Abdomen: Soft/+BS, non tender, non distended, no guarding  Extremities: 2+LE edema, No lymphangitis, No petechiae, No rashes, no synovitis  Data Reviewed: Basic Metabolic Panel:  Recent Labs Lab 05/20/14 1728 05/21/14 0432  NA 141 141  K 4.2 4.1  CL 105 106  CO2 22 24  GLUCOSE 101* 89  BUN 26* 22  CREATININE 1.30 1.26  CALCIUM 9.2 8.9   Liver Function Tests:  Recent Labs Lab 05/20/14 1728 05/21/14 0420  AST 27 25  ALT 15 13  ALKPHOS 99 103  BILITOT 0.5 0.9  PROT 6.5 6.0  ALBUMIN 3.5 3.3*   No results found for this basename: LIPASE, AMYLASE,  in the last 168 hours No results found for this basename: AMMONIA,  in the last 168 hours CBC:  Recent Labs Lab 05/20/14 1728 05/21/14 0432  WBC 5.6 4.4  NEUTROABS 3.8  --   HGB 12.1* 11.9*  HCT 36.7* 36.0*  MCV 94.3 93.0  PLT 110* 115*   Cardiac Enzymes:  Recent Labs Lab 05/20/14 1728  TROPONINI <0.30   BNP: No components found with this basename: POCBNP,  CBG: No results found for this basename: GLUCAP,  in the last 168 hours  No results found for this or any previous visit (from the past 240 hour(s)).   Scheduled Meds: . allopurinol  100 mg Oral Daily  . antiseptic oral rinse  7 mL Mouth Rinse BID  . sertraline  25 mg Oral Daily  . sodium chloride  3 mL Intravenous Q12H  . tamsulosin  0.4 mg Oral BID   Continuous Infusions:    Birch Farino, DO  [facility] Pager [phone]  If 7PM-7AM, please contact night-coverage [email or web address] Password [email or web address] 05/21/2014, 9:56 AM   LOS: 1 day

## 2014-05-22 DIAGNOSIS — J948 Other specified pleural conditions: Secondary | ICD-10-CM

## 2014-05-22 DIAGNOSIS — Z886 Allergy status to analgesic agent status: Secondary | ICD-10-CM | POA: Diagnosis not present

## 2014-05-22 DIAGNOSIS — N183 Chronic kidney disease, stage 3 (moderate): Secondary | ICD-10-CM | POA: Diagnosis not present

## 2014-05-22 DIAGNOSIS — I129 Hypertensive chronic kidney disease with stage 1 through stage 4 chronic kidney disease, or unspecified chronic kidney disease: Secondary | ICD-10-CM | POA: Diagnosis not present

## 2014-05-22 DIAGNOSIS — N400 Enlarged prostate without lower urinary tract symptoms: Secondary | ICD-10-CM | POA: Diagnosis not present

## 2014-05-22 DIAGNOSIS — R531 Weakness: Secondary | ICD-10-CM

## 2014-05-22 DIAGNOSIS — Z888 Allergy status to other drugs, medicaments and biological substances status: Secondary | ICD-10-CM | POA: Diagnosis not present

## 2014-05-22 DIAGNOSIS — R262 Difficulty in walking, not elsewhere classified: Secondary | ICD-10-CM | POA: Diagnosis not present

## 2014-05-22 DIAGNOSIS — I509 Heart failure, unspecified: Secondary | ICD-10-CM | POA: Diagnosis not present

## 2014-05-22 DIAGNOSIS — J90 Pleural effusion, not elsewhere classified: Secondary | ICD-10-CM | POA: Diagnosis not present

## 2014-05-22 DIAGNOSIS — M109 Gout, unspecified: Secondary | ICD-10-CM | POA: Diagnosis not present

## 2014-05-22 DIAGNOSIS — Z79899 Other long term (current) drug therapy: Secondary | ICD-10-CM | POA: Diagnosis not present

## 2014-05-22 DIAGNOSIS — I369 Nonrheumatic tricuspid valve disorder, unspecified: Secondary | ICD-10-CM

## 2014-05-22 DIAGNOSIS — D649 Anemia, unspecified: Secondary | ICD-10-CM | POA: Diagnosis not present

## 2014-05-22 LAB — TSH: TSH: 3.67 u[IU]/mL (ref 0.350–4.500)

## 2014-05-22 LAB — BASIC METABOLIC PANEL
Anion gap: 12 (ref 5–15)
BUN: 24 mg/dL — AB (ref 6–23)
CHLORIDE: 106 meq/L (ref 96–112)
CO2: 23 meq/L (ref 19–32)
Calcium: 8.9 mg/dL (ref 8.4–10.5)
Creatinine, Ser: 1.22 mg/dL (ref 0.50–1.35)
GFR calc Af Amer: 60 mL/min — ABNORMAL LOW (ref >90)
GFR calc non Af Amer: 51 mL/min — ABNORMAL LOW (ref >90)
Glucose, Bld: 82 mg/dL (ref 70–99)
POTASSIUM: 4.3 meq/L (ref 3.7–5.3)
Sodium: 141 mEq/L (ref 137–147)

## 2014-05-22 LAB — VITAMIN B12: VITAMIN B 12: 569 pg/mL (ref 211–911)

## 2014-05-22 LAB — AMMONIA: AMMONIA: 25 umol/L (ref 11–60)

## 2014-05-22 LAB — FOLATE RBC: RBC FOLATE: 1044 ng/mL — AB (ref >280)

## 2014-05-22 MED ORDER — BOOST PLUS PO LIQD: 237.0000 mL | Freq: Two times a day (BID) | ORAL | Status: DC

## 2014-05-22 NOTE — Progress Notes (Signed)
CARE MANAGEMENT NOTE 05/22/2014  Patient:  Audrie GallusBRAMBLETT,Itzae L   Account Number:  [id number]  Date Initiated:  05/21/2014  Documentation initiated by:  Ezekiel InaMcGIBBONEY,COOKIE  Subjective/Objective Assessment:   Pt admitted with Pleural Effusion     Action/Plan:   from home   Anticipated DC Date:  05/22/2014   Anticipated DC Plan:  HOME W HOME HEALTH SERVICES      DC Planning Services  CM consult      Choice offered to / List presented to:  C-4 Adult Children        HH arranged  HH-1 RN  HH-2 PT      [facility]   Status of service:  Completed, signed off Medicare Important Message given?  NA - LOS <3 / Initial given by admissions (If response is "NO", the following Medicare IM given date fields will be blank) Date Medicare IM given:   Medicare IM given by:   Date Additional Medicare IM given:   Additional Medicare IM given by:    Discharge Disposition:  HOME W HOME HEALTH SERVICES  Per UR Regulation:  Reviewed for med. necessity/level of care/duration of stay  If discussed at Long Length of Stay Meetings, dates discussed:    Comments:  05/22/14 MMcGibboney, RN, BSN Spoke with pt's daughter [name] concerning HH needs, she selected [facility] for HHRN/PT. [facility]

## 2014-05-22 NOTE — Discharge Summary (Addendum)
Physician Discharge Summary  [name] L Simmering ZOX:[id number] DOB: 1925-05-12 DOA: 05/20/2014  PCP: Sharon SellerEUBANKS, JESSICA K, NP  Admit date: 05/20/2014 Discharge date: 05/22/2014  Recommendations for Outpatient Follow-up:  1. Pt will need to follow up with PCP in 2 weeks post discharge 2. Please obtain BMP 3. Please also check CBC   Discharge Diagnoses:  Dysarthria  -05/20/2014 CT brain negative  -MRI brain without contrast---neg for stroke or bleed  -Patient's daughter states that this has improved  -serum B12--569, RBC folate 1044 Right-sided pleural effusion  -Worsened when compared to February 2015  -Patient had exudative effusion Feb 2015 with negative cytology  -9/30--Repeat thoracocentesis with LDH and protein--1.2L removed  -Sent pleural fluid for cell count, culture, ANA  -Pleural fluid cell count revealed 99WBC with 92% mononuclear cells -Pleural fluid was exudative by Light's Criteria -After discussion with patient and family about his recurrent exudative pleural effusion, they have decided that they do want to pursue any further aggressive intervention at this time -pt remained clinically stable on RA  -gram stain and culture were neg at time of d/c, ANA and cytology still pending -please f/u on results of ANA and cytology from the pleural fluid -05/22/14 echocardiogram--EF 60%, grade 1 diastolic dysfx, No WMA Lower extremity edema  -Venous duplex r/o DVT--negative   -TSH 3.670 -UA was neg for protein -Urine for protein/creatinine ratio was ordered, but not collected--this can be done in the outpatient setting if clinically indicated HTN  -not on meds at home  -stable off of meds presently  BPH  -continue Flomax  CKD stage 3  -baseline creatinine 1-1.4  -stable--serum creatinine 1.22 on day of d/c Deconditioning -PT recommended HHPT--set up with assistance of care management  Discharge Condition: stable  Disposition:  Follow-up Information   [facility]   Contact information:   [address] [place] [address] [phone]     home  Diet:regular Wt Readings from Last 3 Encounters:  05/22/14 55.4 kg (122 lb 2.2 oz)  04/10/14 53.978 kg (119 lb)  03/07/14 53.071 kg (117 lb)    History of present illness:   80 year old male with a history of hypertension, CKD stage III,diastolic CHF, BPH presents with two-day history of mild confusion and dysarthria. The patient is unable to provide any history as he is a poor historian. The patient's daughter also relates three-day history of increasing lower extremity edema which is fairly new. This is associated with bilateral lower extremity weakness without any mechanical fall. The patient normally uses a walker for assistance. Upon evaluation in the emergency department, the patient was found to have a worsening right-sided pleural effusion. He was placed on supplemental oxygen.The patient had a thoracocentesis on 05/21/2014. 1.2 L of fluid was removed. The patient was weaned off of oxygen. He remained clinically stable on room air. The pleural fluid studies suggested an agitated. WBC count from the pleural fluid was 99 with 92% mononuclear cells. After discussion with the patient's family, they did not want to pursue any further diagnostic workup regarding his pleural effusion at this time. I instructed him to follow up with his primary care physician regarding the cytology and ANA from the pleural fluid. I also instructed the patient and the family to come back to the emergency department ASAP if the patient developed worsening shortness of breath.   Discharge Exam: Filed Vitals:   05/22/14 0422  BP: 137/61  Pulse: 86  Temp: 98.1 F (36.7 C)  Resp: 18  Filed Vitals:   05/21/14 1249 05/21/14 1708 05/21/14 2106 05/22/14 0422  BP: 164/81 132/67 126/61 137/61  Pulse: 93 68 81 86  Temp:  98.1 F (36.7 C) 98 F (36.7 C) 98.1 F (36.7 C)  TempSrc:  Oral Oral  Oral  Resp: 22 20 18 18   Height:      Weight:    55.4 kg (122 lb 2.2 oz)  SpO2: 98%  98% 97%   General: Alert and awake, NAD, pleasant, cooperative Cardiovascular: RRR, no rub, no gallop, no S3 Respiratory: R-diminished BS at base, L-CTA Abdomen:soft, nontender, nondistended, positive bowel sounds Extremities: 1+LE edema, No lymphangitis, no petechiae  Discharge Instructions      Discharge Instructions   Diet - low sodium heart healthy    Complete by:  As directed      Increase activity slowly    Complete by:  As directed             Medication List         acetaminophen 500 MG tablet  Commonly known as:  TYLENOL  Take 1,000 mg by mouth every 6 (six) hours as needed for mild pain.     allopurinol 100 MG tablet  Commonly known as:  ZYLOPRIM  Take 100 mg by mouth daily.     lactose free nutrition Liqd  Take 237 mLs by mouth 2 (two) times daily between meals.     naphazoline-glycerin 0.012-0.2 % Soln  Commonly known as:  CLEAR EYES  Place 1-2 drops into both eyes every 4 (four) hours as needed for irritation.     SYSTANE 0.4-0.3 % Gel  Generic drug:  Polyethyl Glycol-Propyl Glycol  Apply 1 application to eye as needed (dry/watery eyes).     tamsulosin 0.4 MG Caps capsule  Commonly known as:  FLOMAX  Take 0.4 mg by mouth 2 (two) times daily.         The results of significant diagnostics from this hospitalization (including imaging, microbiology, ancillary and laboratory) are listed below for reference.    Significant Diagnostic Studies: Dg Chest 1 View  05/21/2014   CLINICAL DATA:  status post right thoracentesis  EXAM: CHEST - 1 VIEW  COMPARISON:  1 day prior  FINDINGS: High riding humeral heads, consistent with chronic rotator cuff insufficiency. Midline trachea. Normal heart size. Possible trace left pleural fluid. Decrease in small to moderate right pleural effusion. No pneumothorax. Chronic interstitial thickening. Improved right base airspace disease.  Minimal left base atelectasis.  IMPRESSION: Decreased right-sided pleural effusion and adjacent airspace disease.  No pneumothorax.   Electronically Signed   By: Jeronimo Greaves M.D.   On: 05/21/2014 12:06   Dg Chest 2 View  05/20/2014   CLINICAL DATA:  Slurred speech with bilateral ankle swelling for several days.  EXAM: CHEST  2 VIEW  COMPARISON:  Radiographs 12/27/2013 and 12/12/2013.  FINDINGS: 1745 hr. Right pleural effusion has enlarged, now large. There is associated worsening aeration of the right lung base. There is mild left basilar atelectasis and no definite left pleural effusion. The heart size and mediastinal contours are stable.  There is chronic obliteration of the subacromial space of both shoulders consistent with chronic rotator cuff tears. Compression deformities are again noted at the thoracolumbar junction. These appear somewhat progressive, especially the superior one, although are suboptimally visualized.  IMPRESSION: Enlarging right pleural effusion, now large, with worsening right basilar aeration.   Electronically Signed   By: Roxy Horseman M.D.   On: 05/20/2014 18:01  Ct Head Wo Contrast  05/20/2014   CLINICAL DATA:  80 year old male with slurred speech 2 days ago. Hypertension. Initial encounter.  EXAM: CT HEAD WITHOUT CONTRAST  TECHNIQUE: Contiguous axial images were obtained from the base of the skull through the vertex without intravenous contrast.  COMPARISON:  12/27/2013.  FINDINGS: No intracranial hemorrhage.  Small vessel disease type changes without CT evidence of large acute infarct.  Global mild age related atrophy without hydrocephalus.  No intracranial mass lesion noted on this unenhanced exam.  Vascular calcifications.  No calvarial abnormality.  IMPRESSION: No intracranial hemorrhage or CT evidence of large acute infarct.   Electronically Signed   By: Bridgett Larsson M.D.   On: 05/20/2014 18:04   Mr Brain Wo Contrast  05/21/2014   CLINICAL DATA:  Initial encounter for  recent episodes of dysarthria.  EXAM: MRI HEAD WITHOUT CONTRAST  TECHNIQUE: Multiplanar, multiecho pulse sequences of the brain and surrounding structures were obtained without intravenous contrast.  COMPARISON:  CT head without contrast 05/20/2014.  FINDINGS: The diffusion-weighted images demonstrate no evidence for acute or subacute infarction. Moderate generalized atrophy and periventricular white matter changes are noted bilaterally. Ventricles are proportionate to the degree of atrophy. There is no acute hemorrhage or mass lesion. The ventricles are proportionate to the degree of atrophy. A focal area of susceptibility in the anterior right frontal lobe is compatible with remote blood products.  Flow is present in the major intracranial arteries. The patient is status post bilateral lens replacements. The paranasal sinuses are clear. There is some fluid in left mastoid air cells. No obstructing nasopharyngeal lesion is evident.  IMPRESSION: 1. No acute or focal lesion to explain the patient's symptoms of dysarthria. 2. Moderate atrophy and white matter disease is within normal limits for age. 3. Focal area of susceptibility in the anterior right frontal lobe is most compatible with remote blood products. No acute hemorrhage is present. 4. Minimal fluid in the left mastoid air cells. No obstructing nasopharyngeal lesion is evident.   Electronically Signed   By: Gennette Pac M.D.   On: 05/21/2014 14:43   US Thoracentesis Asp Pleural Space W/img Guide  05/21/2014   CLINICAL DATA:  Right pleural effusion, shortness of breath, request for thoracentesis.  EXAM: ULTRASOUND GUIDED DIAGNOSTIC AND THERAPEUTIC THORACENTESIS  COMPARISON:  None.  PROCEDURE: An ultrasound guided thoracentesis was thoroughly discussed with the patient and questions answered. The benefits, risks, alternatives and complications were also discussed. The patient understands and wishes to proceed with the procedure. Written consent was  obtained.  Ultrasound was performed to localize and mark an adequate pocket of fluid in the right chest. The area was then prepped and draped in the normal sterile fashion. 1% Lidocaine was used for local anesthesia. Under ultrasound guidance a 19 gauge Yueh catheter was introduced. Thoracentesis was performed. The catheter was removed and a dressing applied.  Complications:  None immediate.  FINDINGS: A total of approximately 1.2 liters of clear yellow fluid was removed. A fluid sample was sent for laboratory analysis.  IMPRESSION: Successful ultrasound guided right thoracentesis yielding 1.2 liters of pleural fluid.  Read By:  Pattricia Boss PA-C   Electronically Signed   By: Simonne Come M.D.   On: 05/21/2014 14:31     Microbiology: Recent Results (from the past 240 hour(s))  BODY FLUID CULTURE     Status: None   Collection Time    05/21/14 11:20 AM      Result Value Ref Range Status   Specimen  Description PLEURAL RIGHT   Final   Special Requests NONE   Final   Gram Stain     Final   Value: NO WBC SEEN     NO ORGANISMS SEEN     Performed at [facility]   Culture PENDING   Incomplete   Report Status PENDING   Incomplete     Labs: Basic Metabolic Panel:  Recent Labs Lab 05/20/14 1728 05/21/14 0432 05/22/14 0433  NA 141 141 141  K 4.2 4.1 4.3  CL 105 106 106  CO2 22 24 23   GLUCOSE 101* 89 82  BUN 26* 22 24*  CREATININE 1.30 1.26 1.22  CALCIUM 9.2 8.9 8.9   Liver Function Tests:  Recent Labs Lab 05/20/14 1728 05/21/14 0420  AST 27 25  ALT 15 13  ALKPHOS 99 103  BILITOT 0.5 0.9  PROT 6.5 6.0  ALBUMIN 3.5 3.3*   No results found for this basename: LIPASE, AMYLASE,  in the last 168 hours  Recent Labs Lab 05/22/14 0433  AMMONIA 25   CBC:  Recent Labs Lab 05/20/14 1728 05/21/14 0432  WBC 5.6 4.4  NEUTROABS 3.8  --   HGB 12.1* 11.9*  HCT 36.7* 36.0*  MCV 94.3 93.0  PLT 110* 115*   Cardiac Enzymes:  Recent Labs Lab 05/20/14 1728  TROPONINI  <0.30   BNP: No components found with this basename: POCBNP,  CBG: No results found for this basename: GLUCAP,  in the last 168 hours  Time coordinating discharge:  Greater than 30 minutes  Signed:  Jayle Solarz, DO [facility] Pager: [phone] 05/22/2014, 12:44 PM

## 2014-05-22 NOTE — Progress Notes (Signed)
Echocardiogram 2D Echocardiogram has been performed.  Dorothey BasemanReel, Myrle Wanek M 05/22/2014, 11:23 AM

## 2014-05-22 NOTE — Evaluation (Signed)
Physical Therapy Evaluation Patient Details Name: [name] MRN: [id number] DOB: May 09, 1925 Today's Date: 05/22/2014   History of Present Illness  80 year old male with a history of hypertension, CKD stage III, diastolic CHF, BPH admitted with mild confusion and dysarthria. Pt s/p right thoracentesis 9/30  Clinical Impression  Pt currently with functional limitations due to the deficits listed below (see PT Problem List). Pt will benefit from skilled PT to increase their independence and safety with mobility to allow discharge to the venue listed below.  Pt performed mobility and ambulation min/guard today for safety as he reports weakness and decreased endurance.  Pt states his daughter will likely assist him home.  Recommend initial supervision for mobility upon d/c and HHPT.     Follow Up Recommendations Home health PT;Supervision for mobility/OOB    Equipment Recommendations  None recommended by PT    Recommendations for Other Services       Precautions / Restrictions Precautions Precautions: Fall      Mobility  Bed Mobility Overal bed mobility: Needs Assistance Bed Mobility: Supine to Sit     Supine to sit: HOB elevated;Supervision     General bed mobility comments: increased time and effort  Transfers Overall transfer level: Needs assistance Equipment used: Rolling walker (2 wheeled) Transfers: Sit to/from Stand Sit to Stand: Min guard         General transfer comment: verbal cue for hand placement  Ambulation/Gait Ambulation/Gait assistance: Min guard Ambulation Distance (Feet): 80 Feet Assistive device: Rolling walker (2 wheeled) Gait Pattern/deviations: Step-to pattern;Trunk flexed Gait velocity: observed LLD, slow but steady gait, verbal cues for posture and RW positioning, SpO2 97% room air and HR 112 bpm during gait      Stairs            Wheelchair Mobility    Modified Rankin (Stroke Patients Only)       Balance Overall  balance assessment: History of Falls (reports fall in the last 6 months)                                           Pertinent Vitals/Pain      Home Living Family/patient expects to be discharged to:: Private residence Living Arrangements: Spouse/significant other Available Help at Discharge: Family;Available PRN/intermittently Type of Home: House Home Access: Stairs to enter Entrance Stairs-Rails: Right Entrance Stairs-Number of Steps: 2 Home Layout: One level Home Equipment: Bedside commode;Cane - single point;Grab bars - tub/shower;Grab bars - toilet;Wheelchair - [employer]manual;Walker - 2 wheels;Tub bench Additional Comments: pt able to state yes to RW, BSC and SPC, other home equipment per previous hospitalization    Prior Function Level of Independence: Independent with assistive device(s)               Hand Dominance        Extremity/Trunk Assessment               Lower Extremity Assessment: Generalized weakness         Communication   Communication: No difficulties  Cognition Arousal/Alertness: Awake/alert Behavior During Therapy: WFL for tasks assessed/performed Overall Cognitive Status: Within Functional Limits for tasks assessed                      General Comments      Exercises        Assessment/Plan    PT Assessment Patient needs  continued PT services  PT Diagnosis Difficulty walking;Generalized weakness   PT Problem List Decreased activity tolerance;Decreased mobility;Decreased balance;Decreased strength  PT Treatment Interventions Gait training;DME instruction;Functional mobility training;Therapeutic activities;Therapeutic exercise;Patient/family education   PT Goals (Current goals can be found in the Care Plan section) Acute Rehab PT Goals PT Goal Formulation: With patient Time For Goal Achievement: 05/29/14 Potential to Achieve Goals: Good    Frequency Min 3X/week   Barriers to discharge         Co-evaluation               End of Session   Activity Tolerance: Patient tolerated treatment well Patient left: in chair;with call bell/phone within reach Nurse Communication: Mobility status    Functional Assessment Tool Used: clinical judgement Functional Limitation: Mobility: Walking and moving around Mobility: Walking and Moving Around Current Status ([id number]): At least 1 percent but less than 20 percent impaired, limited or restricted Mobility: Walking and Moving Around Goal Status [phone]): 0 percent impaired, limited or restricted    Time: 1109-1119 PT Time Calculation (min): 10 min   Charges:   PT Evaluation $Initial PT Evaluation Tier I: 1 Procedure PT Treatments $Gait Training: 8-22 mins   PT G Codes:   Functional Assessment Tool Used: clinical judgement Functional Limitation: Mobility: Walking and moving around    [place] E 05/22/2014, 12:09 PM Zenovia Jarred, PT, DPT 05/22/2014 Pager: [phone]

## 2014-05-23 DIAGNOSIS — M1990 Unspecified osteoarthritis, unspecified site: Secondary | ICD-10-CM

## 2014-05-23 DIAGNOSIS — I129 Hypertensive chronic kidney disease with stage 1 through stage 4 chronic kidney disease, or unspecified chronic kidney disease: Secondary | ICD-10-CM

## 2014-05-23 DIAGNOSIS — N183 Chronic kidney disease, stage 3 (moderate): Secondary | ICD-10-CM

## 2014-05-23 DIAGNOSIS — I5031 Acute diastolic (congestive) heart failure: Secondary | ICD-10-CM

## 2014-05-23 DIAGNOSIS — Z96641 Presence of right artificial hip joint: Secondary | ICD-10-CM

## 2014-05-23 DIAGNOSIS — Z9181 History of falling: Secondary | ICD-10-CM

## 2014-05-24 LAB — BODY FLUID CULTURE
CULTURE: NO GROWTH
Gram Stain: NONE SEEN

## 2014-05-28 LAB — ANA, BODY FLUID: Anti-Nuclear Ab, IgG: NOT DETECTED

## 2014-06-06 ENCOUNTER — Other Ambulatory Visit: Payer: Self-pay

## 2014-06-06 ENCOUNTER — Other Ambulatory Visit: Payer: Medicare Other

## 2014-06-06 DIAGNOSIS — M1A9XX1 Chronic gout, unspecified, with tophus (tophi): Secondary | ICD-10-CM

## 2014-06-07 LAB — COMPREHENSIVE METABOLIC PANEL
A/G RATIO: 2 (ref 1.1–2.5)
ALT: 11 IU/L (ref 0–44)
AST: 23 IU/L (ref 0–40)
Albumin: 4 g/dL (ref 3.5–4.7)
Alkaline Phosphatase: 96 IU/L (ref 39–117)
BILIRUBIN TOTAL: 0.9 mg/dL (ref 0.0–1.2)
BUN/Creatinine Ratio: 21 (ref 10–22)
BUN: 29 mg/dL — ABNORMAL HIGH (ref 8–27)
CO2: 23 mmol/L (ref 18–29)
Calcium: 9.6 mg/dL (ref 8.6–10.2)
Chloride: 101 mmol/L (ref 97–108)
Creatinine, Ser: 1.36 mg/dL — ABNORMAL HIGH (ref 0.76–1.27)
GFR, EST AFRICAN AMERICAN: 54 mL/min/{1.73_m2} — AB (ref >59)
GFR, EST NON AFRICAN AMERICAN: 46 mL/min/{1.73_m2} — AB (ref >59)
GLOBULIN, TOTAL: 2 g/dL (ref 1.5–4.5)
GLUCOSE: 82 mg/dL (ref 65–99)
POTASSIUM: 4.4 mmol/L (ref 3.5–5.2)
Sodium: 140 mmol/L (ref 134–144)
TOTAL PROTEIN: 6 g/dL (ref 6.0–8.5)

## 2014-06-07 LAB — URIC ACID: Uric Acid: 4.7 mg/dL (ref 3.7–8.6)

## 2014-06-12 ENCOUNTER — Ambulatory Visit (INDEPENDENT_AMBULATORY_CARE_PROVIDER_SITE_OTHER): Payer: Medicare Other | Admitting: Internal Medicine

## 2014-06-12 ENCOUNTER — Encounter: Payer: Self-pay | Admitting: Internal Medicine

## 2014-06-12 VITALS — BP 122/70 | HR 93 | Temp 97.9°F | Resp 18 | Ht 61.0 in | Wt 121.6 lb

## 2014-06-12 DIAGNOSIS — M174 Other bilateral secondary osteoarthritis of knee: Secondary | ICD-10-CM

## 2014-06-12 DIAGNOSIS — I5032 Chronic diastolic (congestive) heart failure: Secondary | ICD-10-CM

## 2014-06-12 DIAGNOSIS — N400 Enlarged prostate without lower urinary tract symptoms: Secondary | ICD-10-CM

## 2014-06-12 DIAGNOSIS — R5381 Other malaise: Secondary | ICD-10-CM

## 2014-06-12 DIAGNOSIS — F329 Major depressive disorder, single episode, unspecified: Secondary | ICD-10-CM

## 2014-06-12 DIAGNOSIS — F32A Depression, unspecified: Secondary | ICD-10-CM

## 2014-06-12 DIAGNOSIS — J90 Pleural effusion, not elsewhere classified: Secondary | ICD-10-CM

## 2014-06-12 NOTE — Progress Notes (Signed)
Patient did not pass the clock test. 

## 2014-06-12 NOTE — Progress Notes (Signed)
[name], male   DOB: 06-Jan-1925, 80 y.o.   MRN: [id number]   Location:  [facility]  Code Status: does have advance directive--living will and hcpoa  Allergies  Allergen Reactions  . Sertraline Nausea Only  . Nsaids Other (See Comments)    history of renal failure    Chief Complaint  Patient presents with  . Medical Management of Chronic Issues    HPI: Patient is a 80 y.o. white male seen in the office today for medical mgt of chronic diseases.  He is here with his daughter, [name].  [name] is his primary care provider and he established with her in August.  He has depression, CKD3, gout, BPH, arthritis of his knees and memory loss.      He scored 25/30 on his MMSE missing points on orientation, recall.  Failed clock--drawn beautifully with numbers properly placed, but set incorrectly.  Feels pretty good right now.  Would like to get back to the mobility that he once had.  Has bilateral osteoarthritis.  [facility]--they don't want to do anymore surgery.  Is bone on bone.  Also lacks get up and go.  His family feels he is depressed--not doing exercises, walking and drinking water like he's supposed.  Sometimes is sad.  Not tearful.  Doesn't sleep through the night.  Appetite is poor.      First fall was in 2012.  Has had three hip surgeries since then.   He weighed 160 lbs before that.  Both times he fell, it was in the house walking room to room--thought is that his hip broke and then he fell.  B/c he won't get moving again and rehab like he should. Has to use catheter for 4 mos after each surgery.  Last surgery was feb, wore catheter, then stopped, flomax changed to rapaflo which helped.  Sept went back to flomax, got filled up with fluid.  Restarted on rapaflo instead.    Fluid in right lung last month, and has been drained twice.    Gets frequent UTIs and can't talk when he gets  them.  Fluid was built up instead.  His daughter thinks he is getting that way again.  Felt better when he drank more water.  Does not feel short of breath.  Is breathing heavily per his daughter, but he doesn't notice it.  Has home health coming out--nurse feels he is ok.    Next week, he will be living with his family and his diet will be improved.  Want to hold off on antidepressant until then.    Worked with [employer].  Also had drawn maps in school.    Review of Systems:  Review of Systems  Constitutional: Positive for malaise/fatigue. Negative for fever.  HENT: Negative for congestion.   Eyes: Negative for blurred vision.  Respiratory: Negative for cough and shortness of breath.   Cardiovascular: Negative for chest pain.  Gastrointestinal: Negative for abdominal pain and constipation.       Poor po intake  Genitourinary: Negative for dysuria, urgency and frequency.  Skin: Negative for rash.  Neurological: Positive for weakness. Negative for dizziness and loss of consciousness.  Endo/Heme/Allergies: Does not bruise/bleed easily.  Psychiatric/Behavioral: Positive for depression and memory loss.    Past Medical History  Diagnosis Date  . Gout   . Insomnia   . BPH (benign prostatic hyperplasia)   . Kidney disorder   .  Arthritis   . Hypertension     patient denies on 10/25/12 on no meds   . Complication of anesthesia     hard to wake up -2011 after hernia surgery   . Anemia   . CHF (congestive heart failure)   . Prostate disorder   . Hernia, abdominal     Past Surgical History  Procedure Laterality Date  . Cataract extraction    . Cholecystectomy    . Hernia repair  02/22/11    right  . Joint replacement      fractured hip  . Total hip arthroplasty Right 10/29/2012    Procedure: CONVERSION OF PREVIOUS HIP SURGERY TO RIGHT TOTAL HIP AND REMOVAL OF IM NAIL ;  Surgeon: Shelda PalMatthew D Olin, MD;  Location: [facility];  Service: Orthopedics;  Laterality: Right;  . Esophagogastroduodenoscopy  N/A 11/16/2012    Procedure: ESOPHAGOGASTRODUODENOSCOPY (EGD);  Surgeon: Theda BelfastPatrick D Hung, MD;  [facility];  Service: Endoscopy;  Laterality: N/A;  . Intramedullary (im) nail intertrochanteric Left 10/17/2013    Procedure: INTRAMEDULLARY (IM) NAIL INTERTROCHANTRIC;  Surgeon: Shelda PalMatthew D Olin, MD;  Location: [facility];  Service: Orthopedics;  Laterality: Left;    Social History:   reports that he has never smoked. He has never used smokeless tobacco. He reports that he does not drink alcohol or use illicit drugs.  Family History  Problem Relation Age of Onset  . Heart failure Mother     heart  . Heart failure Father     fell and hit head  . Cancer Brother     colon  . Diabetes Daughter     Medications: Patient's Medications  New Prescriptions   No medications on file  Previous Medications   ACETAMINOPHEN (TYLENOL) 500 MG TABLET    Take 1,000 mg by mouth every 6 (six) hours as needed for mild pain.   ALLOPURINOL (ZYLOPRIM) 100 MG TABLET    Take 100 mg by mouth daily.   LACTOSE FREE NUTRITION (BOOST PLUS) LIQD    Take 237 mLs by mouth 2 (two) times daily between meals.   NAPHAZOLINE-GLYCERIN (CLEAR EYES) 0.012-0.2 % SOLN    Place 1-2 drops into both eyes every 4 (four) hours as needed for irritation.   POLYETHYL GLYCOL-PROPYL GLYCOL (SYSTANE) 0.4-0.3 % GEL    Apply 1 application to eye as needed (dry/watery eyes).   SILODOSIN (RAPAFLO) 8 MG CAPS CAPSULE    Take 8 mg by mouth daily with breakfast.  Modified Medications   No medications on file  Discontinued Medications   TAMSULOSIN (FLOMAX) 0.4 MG CAPS CAPSULE    Take 0.4 mg by mouth 2 (two) times daily.     Physical Exam: Filed Vitals:   06/12/14 1132  BP: 122/70  Pulse: 93  Temp: 97.9 F (36.6 C)  TempSrc: Oral  Resp: 18  Height: 5\' 1"  (1.549 m)  Weight: 121 lb 9.6 oz (55.157 kg)  SpO2: 99%  Physical Exam  Constitutional: He appears well-developed and well-nourished. No distress.  Cardiovascular: Normal rate,  regular rhythm, normal heart sounds and intact distal pulses.   Pulmonary/Chest: Effort normal and breath sounds normal. He has no rales.  Abdominal: Soft. Bowel sounds are normal.  Neurological: He is alert.  Not fully oriented on mmse  Skin: Skin is warm and dry.  Psychiatric: His behavior is normal. Judgment and thought content normal.  Flat affect    Labs reviewed: Basic Metabolic Panel:  Recent Labs  98/06/9101/26/15 0950 10/18/13 0356  05/21/14 0432 05/22/14 [id number]  06/06/14 0944  NA 141 140  < > 141 141 140  K 4.1 5.6*  < > 4.1 4.3 4.4  CL 109 108  < > 106 106 101  CO2 20 21  < > 24 23 23   GLUCOSE 99 150*  < > 89 82 82  BUN 33* 31*  < > 22 24* 29*  CREATININE 1.37* 1.29  < > 1.26 1.22 1.36*  CALCIUM 9.1 8.4  < > 8.9 8.9 9.6  MG  --  2.0  --   --   --   --   PHOS  --  4.6  --   --   --   --   TSH  --   --   --   --  3.670  --   < > = values in this interval not displayed. Liver Function Tests:  Recent Labs  12/27/13 0950 05/20/14 1728 05/21/14 0420 06/06/14 0944  AST 29 27 25 23   ALT 12 15 13 11   ALKPHOS 104 99 103 96  BILITOT 1.0 0.5 0.9 0.9  PROT 6.6 6.5 6.0 6.0  ALBUMIN 3.8 3.5 3.3*  --    No results found for this basename: LIPASE, AMYLASE,  in the last 8760 hours  Recent Labs  05/22/14 0433  AMMONIA 25   CBC:  Recent Labs  12/27/13 0950 03/03/14 2135 05/20/14 1728 05/21/14 0432  WBC 5.8 13.8* 5.6 4.4  NEUTROABS 4.6 12.4* 3.8  --   HGB 13.6 13.9 12.1* 11.9*  HCT 41.2 41.7 36.7* 36.0*  MCV 92.6 91.6 94.3 93.0  PLT 121* 124* 110* 115*   Lab Results  Component Value Date   HGBA1C 5.7 11/17/2009   Cytology reviewed from thoracocentesis--showed only reactive mesothelial cells  Echo reviewed:  EF 60%, diastolic dysfunction  Assessment/Plan 1. Depression -pt's daughter wants to wait until he is living with her to try an antidepressant and only if he is not improving with increased socialization and intake -cont two ensures per day -given 4 boost  samples and some coupons today  2. Recurrent right pleural effusion -transudative effusion with normal cytology present -no evidence of malignancy seen despite his weight loss and debility -seems this is due to his heart failure -discussed with him and his daughter that we should do a CT chest if the fluid recurs again --fluid retention has been better since switching back to rapaflo from flomax  3. BPH (benign prostatic hyperplasia) -cont rapaflo which has been effective and has helped with edema  4. Chronic diastolic congestive heart failure -as per echo earlier this month -suspect this is etiology of his effusion -if recurs again, would add low dose torsemide and potassium, but hoping to avoid with his renal dysfunction  5. Other bilateral secondary osteoarthritis of knee -s/p steroid injection at orthopedics with benefit, but still not walking  6. Physical deconditioning -encouraged to eat more and walk at home, cont ensure, cont home health therapy  Labs/tests ordered:  No new today Next appt:  December after he has been living with his family and getting better nutrition  Ronnell Makarewicz [address]. [address], D.O. Geriatrics [facility] [address], [place] [address] Cell Phone (Mon-Fri 8am-5pm):  [phone] On Call:  [phone] & follow prompts after 5pm & weekends Office Phone:  [phone] Office Fax:  [phone]

## 2014-07-20 ENCOUNTER — Emergency Department (HOSPITAL_BASED_OUTPATIENT_CLINIC_OR_DEPARTMENT_OTHER)
Admission: EM | Admit: 2014-07-20 | Discharge: 2014-07-20 | Disposition: A | Discharge status: Home / Self Care | Payer: Medicare Other | Attending: Emergency Medicine | Admitting: Emergency Medicine

## 2014-07-20 ENCOUNTER — Emergency Department (HOSPITAL_BASED_OUTPATIENT_CLINIC_OR_DEPARTMENT_OTHER): Payer: Medicare Other

## 2014-07-20 ENCOUNTER — Encounter (HOSPITAL_BASED_OUTPATIENT_CLINIC_OR_DEPARTMENT_OTHER): Payer: Self-pay | Admitting: *Deleted

## 2014-07-20 DIAGNOSIS — Z862 Personal history of diseases of the blood and blood-forming organs and certain disorders involving the immune mechanism: Secondary | ICD-10-CM | POA: Diagnosis not present

## 2014-07-20 DIAGNOSIS — M1990 Unspecified osteoarthritis, unspecified site: Secondary | ICD-10-CM | POA: Insufficient documentation

## 2014-07-20 DIAGNOSIS — N400 Enlarged prostate without lower urinary tract symptoms: Secondary | ICD-10-CM | POA: Insufficient documentation

## 2014-07-20 DIAGNOSIS — J90 Pleural effusion, not elsewhere classified: Secondary | ICD-10-CM | POA: Insufficient documentation

## 2014-07-20 DIAGNOSIS — M109 Gout, unspecified: Secondary | ICD-10-CM | POA: Insufficient documentation

## 2014-07-20 DIAGNOSIS — Z8669 Personal history of other diseases of the nervous system and sense organs: Secondary | ICD-10-CM | POA: Diagnosis not present

## 2014-07-20 DIAGNOSIS — Z79899 Other long term (current) drug therapy: Secondary | ICD-10-CM | POA: Insufficient documentation

## 2014-07-20 DIAGNOSIS — Z9849 Cataract extraction status, unspecified eye: Secondary | ICD-10-CM | POA: Diagnosis not present

## 2014-07-20 DIAGNOSIS — Z87448 Personal history of other diseases of urinary system: Secondary | ICD-10-CM | POA: Diagnosis not present

## 2014-07-20 DIAGNOSIS — I509 Heart failure, unspecified: Secondary | ICD-10-CM | POA: Diagnosis not present

## 2014-07-20 DIAGNOSIS — I10 Essential (primary) hypertension: Secondary | ICD-10-CM | POA: Diagnosis not present

## 2014-07-20 DIAGNOSIS — R1310 Dysphagia, unspecified: Secondary | ICD-10-CM | POA: Diagnosis present

## 2014-07-20 LAB — COMPREHENSIVE METABOLIC PANEL
ALT: 12 U/L (ref 0–53)
ANION GAP: 13 (ref 5–15)
AST: 23 U/L (ref 0–37)
Albumin: 3.3 g/dL — ABNORMAL LOW (ref 3.5–5.2)
Alkaline Phosphatase: 131 U/L — ABNORMAL HIGH (ref 39–117)
BILIRUBIN TOTAL: 1.4 mg/dL — AB (ref 0.3–1.2)
BUN: 20 mg/dL (ref 6–23)
CO2: 24 mEq/L (ref 19–32)
CREATININE: 1.1 mg/dL (ref 0.50–1.35)
Calcium: 9.5 mg/dL (ref 8.4–10.5)
Chloride: 103 mEq/L (ref 96–112)
GFR calc Af Amer: 68 mL/min — ABNORMAL LOW (ref >90)
GFR, EST NON AFRICAN AMERICAN: 58 mL/min — AB (ref >90)
GLUCOSE: 87 mg/dL (ref 70–99)
Potassium: 4.4 mEq/L (ref 3.7–5.3)
Sodium: 140 mEq/L (ref 137–147)
Total Protein: 6.2 g/dL (ref 6.0–8.3)

## 2014-07-20 LAB — CBC WITH DIFFERENTIAL/PLATELET
BASOS ABS: 0 10*3/uL (ref 0.0–0.1)
BASOS PCT: 1 % (ref 0–1)
Eosinophils Absolute: 0 10*3/uL (ref 0.0–0.7)
Eosinophils Relative: 1 % (ref 0–5)
HCT: 40.5 % (ref 39.0–52.0)
Hemoglobin: 13.7 g/dL (ref 13.0–17.0)
LYMPHS PCT: 12 % (ref 12–46)
Lymphs Abs: 0.6 10*3/uL — ABNORMAL LOW (ref 0.7–4.0)
MCH: 31.6 pg (ref 26.0–34.0)
MCHC: 33.8 g/dL (ref 30.0–36.0)
MCV: 93.5 fL (ref 78.0–100.0)
Monocytes Absolute: 0.5 10*3/uL (ref 0.1–1.0)
Monocytes Relative: 9 % (ref 3–12)
NEUTROS ABS: 4.2 10*3/uL (ref 1.7–7.7)
Neutrophils Relative %: 77 % (ref 43–77)
Platelets: 128 10*3/uL — ABNORMAL LOW (ref 150–400)
RBC: 4.33 MIL/uL (ref 4.22–5.81)
RDW: 14.1 % (ref 11.5–15.5)
WBC: 5.4 10*3/uL (ref 4.0–10.5)

## 2014-07-20 LAB — URINALYSIS, ROUTINE W REFLEX MICROSCOPIC
Bilirubin Urine: NEGATIVE
Glucose, UA: NEGATIVE mg/dL
Hgb urine dipstick: NEGATIVE
Ketones, ur: 15 mg/dL — AB
LEUKOCYTES UA: NEGATIVE
Nitrite: NEGATIVE
PH: 6 (ref 5.0–8.0)
Protein, ur: NEGATIVE mg/dL
SPECIFIC GRAVITY, URINE: 1.012 (ref 1.005–1.030)
UROBILINOGEN UA: 1 mg/dL (ref 0.0–1.0)

## 2014-07-20 LAB — PRO B NATRIURETIC PEPTIDE: Pro B Natriuretic peptide (BNP): 383.1 pg/mL (ref 0–450)

## 2014-07-20 NOTE — ED Provider Notes (Signed)
CSN: [id number]     Arrival date & time 07/20/14  1020 History   First MD Initiated Contact with Patient 07/20/14 1045     Chief Complaint  Patient presents with  . Dysphagia     (Consider location/radiation/quality/duration/timing/severity/associated sxs/prior Treatment) HPI  80 year old male presents with difficulty speaking and swallowing over the past 3-4 days since Thanksgiving. Patient has had similar symptoms since this 2 or 3 times. The first time was related to a UTI. After a box his symptoms resolved. The second time was a couple months ago when he was diagnosed with a very large right pleural effusion which was drained in the hospital. This was determined to be an exudative pleural effusion per the records. No obvious source was found. Patient is now having these Symptoms over the past 3-4 days. There is no slurring of speech but his voice sounds different. Sounds like he needs to constantly clear his throat. He is not having fever, congestion, cough, or shortness of breath. No pain. No leg swelling. No weakness or numbness. He has not been confused per the daughter at the bedside. His weight has been at his "dry weight", he weighed himself this morning.  Past Medical History  Diagnosis Date  . Gout   . Insomnia   . BPH (benign prostatic hyperplasia)   . Kidney disorder   . Arthritis   . Hypertension     patient denies on 10/25/12 on no meds   . Complication of anesthesia     hard to wake up -2011 after hernia surgery   . Anemia   . CHF (congestive heart failure)   . Prostate disorder   . Hernia, abdominal    Past Surgical History  Procedure Laterality Date  . Cataract extraction    . Cholecystectomy    . Hernia repair  02/22/11    right  . Joint replacement      fractured hip  . Total hip arthroplasty Right 10/29/2012    Procedure: CONVERSION OF PREVIOUS HIP SURGERY TO RIGHT TOTAL HIP AND REMOVAL OF IM NAIL ;  Surgeon: Shelda Pal, MD;  Location: [facility];  Service:  Orthopedics;  Laterality: Right;  . Esophagogastroduodenoscopy N/A 11/16/2012    Procedure: ESOPHAGOGASTRODUODENOSCOPY (EGD);  Surgeon: Theda Belfast, MD;  [facility];  Service: Endoscopy;  Laterality: N/A;  . Intramedullary (im) nail intertrochanteric Left 10/17/2013    Procedure: INTRAMEDULLARY (IM) NAIL INTERTROCHANTRIC;  Surgeon: Shelda Pal, MD;  Location: [facility];  Service: Orthopedics;  Laterality: Left;   Family History  Problem Relation Age of Onset  . Heart failure Mother     heart  . Heart failure Father     fell and hit head  . Cancer Brother     colon  . Diabetes Daughter    History  Substance Use Topics  . Smoking status: Never Smoker   . Smokeless tobacco: Never Used  . Alcohol Use: No    Review of Systems  Constitutional: Negative for fever.  HENT: Positive for voice change. Negative for sore throat.   Respiratory: Positive for cough. Negative for shortness of breath.   Cardiovascular: Negative for chest pain and leg swelling.  Gastrointestinal: Negative for vomiting.  Neurological: Positive for speech difficulty. Negative for weakness and headaches.  Psychiatric/Behavioral: Negative for confusion.  All other systems reviewed and are negative.     Allergies  Sertraline and Nsaids  Home Medications   Prior to Admission medications   Medication Sig Start Date End  Date Taking? Authorizing Provider  acetaminophen (TYLENOL) 500 MG tablet Take 1,000 mg by mouth every 6 (six) hours as needed for mild pain.    Historical Provider, MD  allopurinol (ZYLOPRIM) 100 MG tablet Take 100 mg by mouth daily.    Historical Provider, MD  lactose free nutrition (BOOST PLUS) LIQD Take 237 mLs by mouth 2 (two) times daily between meals. 05/22/14   Catarina Hartshornavid Tat, MD  naphazoline-glycerin (CLEAR EYES) 0.012-0.2 % SOLN Place 1-2 drops into both eyes every 4 (four) hours as needed for irritation.    Historical Provider, MD  Polyethyl Glycol-Propyl Glycol (SYSTANE) 0.4-0.3  % GEL Apply 1 application to eye as needed (dry/watery eyes).    Historical Provider, MD  silodosin (RAPAFLO) 8 MG CAPS capsule Take 8 mg by mouth daily with breakfast.    Historical Provider, MD   BP 128/58 mmHg  Pulse 89  Temp(Src) 98.3 F (36.8 C) (Oral)  Resp 18  SpO2 95% Physical Exam  Constitutional: He is oriented to person, place, and time. He appears well-developed and well-nourished. No distress.  HENT:  Head: Normocephalic and atraumatic.  Right Ear: External ear normal.  Left Ear: External ear normal.  Nose: Nose normal.  Eyes: EOM are normal. Pupils are equal, round, and reactive to light. Right eye exhibits no discharge. Left eye exhibits no discharge.  Neck: Neck supple.  Cardiovascular: Normal rate, regular rhythm, normal heart sounds and intact distal pulses.   Pulmonary/Chest: Effort normal. No respiratory distress. He has decreased breath sounds in the right middle field and the right lower field.  Abdominal: Soft. There is no tenderness.  Musculoskeletal: He exhibits no edema.  Neurological: He is alert and oriented to person, place, and time.  CN 2-12 grossly intact. 5/5 strength in all 4 extremities. Speech normal to me, different per family but no slurring or facial droop.  Skin: Skin is warm and dry.  Nursing note and vitals reviewed.   ED Course  Procedures (including critical care time) Labs Review Labs Reviewed  COMPREHENSIVE METABOLIC PANEL - Abnormal; Notable for the following:    Albumin 3.3 (*)    Alkaline Phosphatase 131 (*)    Total Bilirubin 1.4 (*)    GFR calc non Af Amer 58 (*)    GFR calc Af Amer 68 (*)    All other components within normal limits  CBC WITH DIFFERENTIAL - Abnormal; Notable for the following:    Platelets 128 (*)    Lymphs Abs 0.6 (*)    All other components within normal limits  URINALYSIS, ROUTINE W REFLEX MICROSCOPIC - Abnormal; Notable for the following:    Ketones, ur 15 (*)    All other components within normal  limits  PRO B NATRIURETIC PEPTIDE    Imaging Review Dg Chest 2 View  07/20/2014   CLINICAL DATA:  Difficulty swallowing with thick mucus in the throat. Cough.  EXAM: CHEST  2 VIEW  COMPARISON:  05/21/2014 and 05/20/2014  FINDINGS: The large right pleural effusion has enlarged in the interim. There is marked volume loss related to the large right pleural effusion. Left lung is clear. Heart size is normal. The trachea is midline. Surgical clips in the right upper abdomen. Again noted are compression fractures in the upper lumbar spine.  IMPRESSION: Enlargement of the right pleural effusion. Pleural effusion is large for size.   Electronically Signed   By: Richarda OverlieAdam  Henn M.D.   On: 07/20/2014 12:00     EKG Interpretation None  MDM   Final diagnoses:  Recurrent right pleural effusion    Patient appears well, has no dyspnea at rest. Moderate dyspnea with exertion, sats remain 92%+. Has recurrent right pleural effusion. His "speech change" does not seem c/w a CVA. D/w pulmonology, Dr. Darrol AngelSimons, who can help arrange close f/u in next 1-2 days with pulm as outpatient, and radiology can drain perform thoracentesis tomorrow AM. He has no elevated WBC, fever or hypoxia to suggest PNA. Does not appear ill like he needs admission. Will arrange thoracentesis for tomorrow and discussed strict return precautions with patient and daughter.     Audree CamelScott T Erionna Strum, MD 07/20/14 [phone]

## 2014-07-20 NOTE — Discharge Instructions (Signed)
Radiology will call you tomorrow to set up a thoracentesis (drainage of the fluid) for tomorrow morning/afternoon. If shortness of breath develops, high fever, trouble speaking, or other concerning findings, return to the ER immediately. It is important to follow-up with a pulmonologist (lung doctor) as well. If they do not call you by tomorrow use the number above.    Pleural Effusion The lining covering your lungs and the inside of your chest is called the pleura. Usually, the space between the two pleura contains no air and only a thin layer of fluid. A pleural effusion is an abnormal buildup of fluid in the pleural space. Fluid gathers when there is increased pressure in the lung vessels. This forces fluids out of the lungs and into the pleural space. Vessels may also leak fluids when there are infections, such as pneumonia, or other causes of soreness and redness (inflammation). Fluids leak into the lungs when protein in the blood is low or when certain vessels (lymphatics) are blocked. Finding a pleural effusion is important because it is usually caused by another disease. In order to treat a pleural effusion, your health care provider needs to find its cause. If left untreated, a large amount of fluid can build up and cause collapse of the lung. CAUSES   Heart failure.  Infections (pneumonia, tuberculosis), pulmonary embolism, pulmonary infarction.  Cancer (primary lung and metastatic), asbestosis.  Liver failure (cirrhosis).  Nephrotic syndrome, peritoneal dialysis, kidney problems (uremia).  Collagen vascular disease (systemic lupus erythematosus, rheumatoid arthritis).  Injury (trauma) to the chest or rupture of the digestive tube (esophagus).  Material in the chest or pleural space (hemothorax, chylothorax).  Pancreatitis.  Surgery.  Drug reactions. SYMPTOMS  A pleural effusion can decrease the amount of space available for breathing and make you short of breath. The fluid  can become infected, which may cause pain and fever. Often, the pain is worse when taking a deep breath. The underlying disease (heart failure, pneumonia, blood clot, tuberculosis, cancer) may also cause symptoms. DIAGNOSIS   Your health care provider can usually tell what is wrong by talking to you (taking a history), doing an exam, and taking a routine X-ray. If the X-ray shows fluid in your chest, often fluid is removed from your chest with a needle for testing (diagnostic thoracentesis).  Sometimes, more specialized X-rays may be needed.  Sometimes, a small piece of tissue is removed and examined by a specialist (biopsy). TREATMENT  Treatment varies based on what caused the pleural effusion. Treatments include:  Removing as much fluid as possible using a needle (thoracentesis) to improve the cough and shortness of breath. This is a simple procedure that can be done at bedside. The risks are bleeding, infection, collapse of a lung, or low blood pressure.  Placing a tube in the chest to drain the effusion (tube thoracostomy). This is often used when there is an infection in the fluid. This is a simple procedure that can often be done at bedside or in a clinic. The procedure may be painful. The risks are the same as using a needle to drain the fluid. The chest tube usually remains for a few days and is connected to suction to improve fluid drainage. After placement, the tube usually does not cause much discomfort.  Surgical removal of fibrous debris in and around the pleural space (decortication). This may be done with a flexible telescope (thoracoscope) through a small or large cut (incision). This is helpful for patients who have fibrosis or scar  tissue that prevents complete lung expansion. The risks are infection, blood loss, and side effects from general anesthesia.  Sometimes, a procedure called pleurodesis is done. A chest tube is placed and the fluid is drained. Next, an agent  (tetracycline, talc powder) is added to the pleural space. This causes the lung and chest wall to stick together (adhesion). This leaves no potential space for fluid to build up. The risks include infection, blood loss, and side effects from general anesthesia.  If the effusion is caused by infection, it may be treated with antibiotics and may improve without draining. HOME CARE INSTRUCTIONS   Take any medicines exactly as prescribed.  Follow up with your health care provider as directed.  Monitor your exercise capacity (the amount of walking you can do before you get short of breath).  Do not use any tobacco products including cigarettes, chewing tobacco, or electronic cigarettes. SEEK MEDICAL CARE IF:   Your exercise capacity seems to get worse or does not improve with time.  You do not recover from your illness.  You have drainage, redness, swelling, or pain at any incision or puncture sites. SEEK IMMEDIATE MEDICAL CARE IF:   Shortness of breath or chest pain develops or gets worse.  You have a fever.  You develop a new cough, especially if the mucus (phlegm) is discolored. MAKE SURE YOU:   Understand these instructions.  Will watch your condition.  Will get help right away if you are not doing well or get worse. Document Released: 08/08/2005 Document Revised: 12/23/2013 Document Reviewed: 03/30/2007 [facility] Patient Information 2015 [employer] [place]. This information is not intended to replace advice given to you by your health care provider. Make sure you discuss any questions you have with your health care provider.

## 2014-07-20 NOTE — ED Notes (Signed)
Per family member, Mr [name] has been experiencing difficulty swallowing since thanksgiving, thick mucus in his throat during the night. Concerned because when he experienced these symptoms in the past he had an underlying illness such as a UTI or fluid buildup. He has been weighing himself and is not experiencing  any weight gain.Marland Kitchen. No pain, no SOB, no symptoms except for swallowing issues

## 2014-07-21 ENCOUNTER — Ambulatory Visit (HOSPITAL_COMMUNITY)
Admission: RE | Admit: 2014-07-21 | Discharge: 2014-07-21 | Disposition: A | Discharge status: Home / Self Care | Payer: Medicare Other | Source: Ambulatory Visit | Attending: Emergency Medicine | Admitting: Emergency Medicine

## 2014-07-21 ENCOUNTER — Ambulatory Visit (HOSPITAL_COMMUNITY)
Admission: RE | Admit: 2014-07-21 | Discharge: 2014-07-21 | Disposition: A | Discharge status: Home / Self Care | Payer: Medicare Other | Source: Ambulatory Visit | Attending: Radiology | Admitting: Radiology

## 2014-07-21 DIAGNOSIS — N400 Enlarged prostate without lower urinary tract symptoms: Secondary | ICD-10-CM | POA: Diagnosis present

## 2014-07-21 DIAGNOSIS — Z66 Do not resuscitate: Secondary | ICD-10-CM | POA: Diagnosis present

## 2014-07-21 DIAGNOSIS — Z888 Allergy status to other drugs, medicaments and biological substances status: Secondary | ICD-10-CM

## 2014-07-21 DIAGNOSIS — R1310 Dysphagia, unspecified: Principal | ICD-10-CM | POA: Diagnosis present

## 2014-07-21 DIAGNOSIS — J90 Pleural effusion, not elsewhere classified: Secondary | ICD-10-CM

## 2014-07-21 DIAGNOSIS — I129 Hypertensive chronic kidney disease with stage 1 through stage 4 chronic kidney disease, or unspecified chronic kidney disease: Secondary | ICD-10-CM | POA: Diagnosis present

## 2014-07-21 DIAGNOSIS — K5730 Diverticulosis of large intestine without perforation or abscess without bleeding: Secondary | ICD-10-CM | POA: Diagnosis present

## 2014-07-21 DIAGNOSIS — D649 Anemia, unspecified: Secondary | ICD-10-CM | POA: Diagnosis present

## 2014-07-21 DIAGNOSIS — E43 Unspecified severe protein-calorie malnutrition: Secondary | ICD-10-CM | POA: Diagnosis present

## 2014-07-21 DIAGNOSIS — M109 Gout, unspecified: Secondary | ICD-10-CM | POA: Diagnosis present

## 2014-07-21 DIAGNOSIS — M1990 Unspecified osteoarthritis, unspecified site: Secondary | ICD-10-CM | POA: Diagnosis present

## 2014-07-21 DIAGNOSIS — M419 Scoliosis, unspecified: Secondary | ICD-10-CM | POA: Diagnosis present

## 2014-07-21 DIAGNOSIS — K766 Portal hypertension: Secondary | ICD-10-CM | POA: Diagnosis present

## 2014-07-21 DIAGNOSIS — Z515 Encounter for palliative care: Secondary | ICD-10-CM

## 2014-07-21 DIAGNOSIS — N281 Cyst of kidney, acquired: Secondary | ICD-10-CM | POA: Diagnosis present

## 2014-07-21 DIAGNOSIS — K7689 Other specified diseases of liver: Secondary | ICD-10-CM | POA: Diagnosis present

## 2014-07-21 DIAGNOSIS — N183 Chronic kidney disease, stage 3 (moderate): Secondary | ICD-10-CM | POA: Diagnosis present

## 2014-07-21 DIAGNOSIS — K5900 Constipation, unspecified: Secondary | ICD-10-CM | POA: Diagnosis present

## 2014-07-21 DIAGNOSIS — G7000 Myasthenia gravis without (acute) exacerbation: Secondary | ICD-10-CM | POA: Diagnosis present

## 2014-07-21 DIAGNOSIS — Z9849 Cataract extraction status, unspecified eye: Secondary | ICD-10-CM

## 2014-07-21 DIAGNOSIS — I5031 Acute diastolic (congestive) heart failure: Secondary | ICD-10-CM | POA: Diagnosis present

## 2014-07-21 DIAGNOSIS — Z6820 Body mass index (BMI) 20.0-20.9, adult: Secondary | ICD-10-CM

## 2014-07-21 DIAGNOSIS — E870 Hyperosmolality and hypernatremia: Secondary | ICD-10-CM | POA: Diagnosis present

## 2014-07-21 DIAGNOSIS — Z96641 Presence of right artificial hip joint: Secondary | ICD-10-CM | POA: Diagnosis present

## 2014-07-21 LAB — BODY FLUID CELL COUNT WITH DIFFERENTIAL
Eos, Fluid: 0 %
LYMPHS FL: 8 %
Monocyte-Macrophage-Serous Fluid: 82 % (ref 50–90)
Neutrophil Count, Fluid: 10 % (ref 0–25)
WBC FLUID: 131 uL (ref 0–1000)

## 2014-07-21 LAB — LACTATE DEHYDROGENASE, PLEURAL OR PERITONEAL FLUID: LD, Fluid: 127 U/L — ABNORMAL HIGH (ref 3–23)

## 2014-07-21 LAB — PROTEIN, BODY FLUID: Total protein, fluid: 3.9 g/dL

## 2014-07-21 NOTE — Procedures (Signed)
   US guided Rt thora 1.5 liters yellow fluid  Sent for labs  Developed cough Stopped thora although some fluid remains  cxr pending

## 2014-07-22 ENCOUNTER — Other Ambulatory Visit (HOSPITAL_COMMUNITY): Payer: Self-pay | Admitting: Pulmonary Disease

## 2014-07-22 ENCOUNTER — Ambulatory Visit (INDEPENDENT_AMBULATORY_CARE_PROVIDER_SITE_OTHER): Payer: Medicare Other | Admitting: Pulmonary Disease

## 2014-07-22 ENCOUNTER — Other Ambulatory Visit: Payer: Self-pay | Admitting: Pulmonary Disease

## 2014-07-22 ENCOUNTER — Ambulatory Visit (INDEPENDENT_AMBULATORY_CARE_PROVIDER_SITE_OTHER): Payer: Medicare Other | Admitting: Internal Medicine

## 2014-07-22 ENCOUNTER — Encounter: Payer: Self-pay | Admitting: Internal Medicine

## 2014-07-22 ENCOUNTER — Encounter: Payer: Self-pay | Admitting: Pulmonary Disease

## 2014-07-22 VITALS — BP 90/62 | HR 96 | Temp 97.4°F | Resp 18 | Ht 63.0 in | Wt 113.4 lb

## 2014-07-22 VITALS — BP 82/54 | HR 88 | Ht 63.0 in | Wt 115.8 lb

## 2014-07-22 DIAGNOSIS — H02102 Unspecified ectropion of right lower eyelid: Secondary | ICD-10-CM | POA: Insufficient documentation

## 2014-07-22 DIAGNOSIS — R1310 Dysphagia, unspecified: Secondary | ICD-10-CM

## 2014-07-22 DIAGNOSIS — J90 Pleural effusion, not elsewhere classified: Secondary | ICD-10-CM

## 2014-07-22 DIAGNOSIS — D649 Anemia, unspecified: Secondary | ICD-10-CM

## 2014-07-22 DIAGNOSIS — F329 Major depressive disorder, single episode, unspecified: Secondary | ICD-10-CM

## 2014-07-22 DIAGNOSIS — R490 Dysphonia: Secondary | ICD-10-CM | POA: Insufficient documentation

## 2014-07-22 DIAGNOSIS — R1314 Dysphagia, pharyngoesophageal phase: Secondary | ICD-10-CM

## 2014-07-22 DIAGNOSIS — N183 Chronic kidney disease, stage 3 (moderate): Secondary | ICD-10-CM

## 2014-07-22 DIAGNOSIS — M1712 Unilateral primary osteoarthritis, left knee: Secondary | ICD-10-CM

## 2014-07-22 DIAGNOSIS — I129 Hypertensive chronic kidney disease with stage 1 through stage 4 chronic kidney disease, or unspecified chronic kidney disease: Secondary | ICD-10-CM

## 2014-07-22 DIAGNOSIS — I10 Essential (primary) hypertension: Secondary | ICD-10-CM

## 2014-07-22 DIAGNOSIS — I5030 Unspecified diastolic (congestive) heart failure: Secondary | ICD-10-CM

## 2014-07-22 LAB — CREATININE, FLUID (PLEURAL, PERITONEAL, JP DRAINAGE): Creat, Fluid: 1.2 mg/dL

## 2014-07-22 NOTE — Progress Notes (Signed)
[name], male   DOB: January 30, 1925, 80 y.o.   MRN: [id number]    Facility  [facility]    Place of Service:   OFFICE   Allergies  Allergen Reactions  . Sertraline Nausea Only  . Nsaids Other (See Comments)    history of renal failure    Chief Complaint  Patient presents with  . Acute Visit    can't swallow food, water. had fluid in his right lung, right eye is hanging, patient states that when he coughs its  frothy bubbly liquid.    HPI:  Last seen by Dr. Hollace Kinnier on 06/12/14.  Dysphagia: Choking and coughing. Has problem with liquids and with solids. Seen by Dr. Karl Bales, pulmonologist, earlier today. He postulates that the patient may have a bulbar weakness as a unifying diagnosis for both dysphagia and dysphonia. He recommended neurologic evaluation.  Essential hypertension; now running on the low side  Dysphonia: A hoarseness which seemed to begin about the time he started having trouble swallowing.  Ectropion of right lower eyelid: Constant tearing and mild discomfort.    Medications: Patient's Medications  New Prescriptions   No medications on file  Previous Medications   ACETAMINOPHEN (TYLENOL) 500 MG TABLET    Take 1,000 mg by mouth every 6 (six) hours as needed for mild pain.   ALLOPURINOL (ZYLOPRIM) 100 MG TABLET    Take 100 mg by mouth daily.   LACTOSE FREE NUTRITION (BOOST PLUS) LIQD    Take 237 mLs by mouth 2 (two) times daily between meals.   NAPHAZOLINE-GLYCERIN (CLEAR EYES) 0.012-0.2 % SOLN    Place 1-2 drops into both eyes every 4 (four) hours as needed for irritation.   POLYETHYL GLYCOL-PROPYL GLYCOL (SYSTANE) 0.4-0.3 % GEL    Apply 1 application to eye as needed (dry/watery eyes).   SILODOSIN (RAPAFLO) 8 MG CAPS CAPSULE    Take 8 mg by mouth daily with breakfast.  Modified Medications   No medications on file  Discontinued Medications   No medications on file     Review of Systems  Constitutional: Positive for activity change, appetite  change and fatigue. Negative for fever and unexpected weight change.  HENT: Positive for congestion, trouble swallowing and voice change. Negative for ear pain, hearing loss, rhinorrhea, sore throat and tinnitus.   Eyes:       Corrective lenses  Respiratory: Positive for cough, choking and shortness of breath. Negative for chest tightness and wheezing.   Cardiovascular: Negative for chest pain, palpitations and leg swelling.  Gastrointestinal: Negative for nausea, abdominal pain, diarrhea, constipation and abdominal distention.  Endocrine: Negative for cold intolerance, heat intolerance, polydipsia, polyphagia and polyuria.  Genitourinary: Positive for frequency. Negative for dysuria, urgency and testicular pain.       Not incontinent  Musculoskeletal: Positive for gait problem. Negative for myalgias, back pain, arthralgias and neck pain.  Skin: Negative for color change, pallor and rash.  Allergic/Immunologic: Negative.   Neurological: Negative for dizziness, tremors, syncope, speech difficulty, weakness, numbness and headaches.  Hematological: Negative for adenopathy. Does not bruise/bleed easily.  Psychiatric/Behavioral: Negative for hallucinations, behavioral problems, confusion, sleep disturbance and decreased concentration. The patient is not nervous/anxious.     Filed Vitals:   07/22/14 1425  BP: 90/62  Pulse: 96  Temp: 97.4 F (36.3 C)  TempSrc: Oral  Resp: 18  Height: $Remove'5\' 3"'XVUQAet$  (1.6 m)  Weight: 113 lb 6.4 oz (51.438 kg)  SpO2: 96%   Body mass index is 20.09 kg/(m^2).  Physical Exam  Constitutional: He is oriented to person, place, and time. He appears well-developed and well-nourished. No distress.  Frail. Thin.  HENT:  Right Ear: External ear normal.  Left Ear: External ear normal.  Nose: Nose normal.  Mouth/Throat: Oropharynx is clear and moist. No oropharyngeal exudate.  Eyes: Conjunctivae and EOM are normal. Pupils are equal, round, and reactive to light.  Ectropion  right lower lid s.ince about 2012  Neck: No JVD present. No tracheal deviation present. No thyromegaly present.  Cardiovascular: Normal rate, regular rhythm, normal heart sounds and intact distal pulses.  Exam reveals no gallop and no friction rub.   No murmur heard. Pulmonary/Chest: Effort normal. No respiratory distress. He has no wheezes. He has rales. He exhibits no tenderness.  Patient is clearing his throat frequently during this exam.  Abdominal: Soft. Bowel sounds are normal. He exhibits no distension and no mass. There is no tenderness.  Musculoskeletal: Normal range of motion. He exhibits no edema or tenderness.  Lymphadenopathy:    He has no cervical adenopathy.  Neurological: He is alert and oriented to person, place, and time. He has normal reflexes. No cranial nerve deficit. Coordination normal.  Not fully oriented on mmse  Skin: Skin is warm and dry. No rash noted. No erythema. No pallor.  Psychiatric: He has a normal mood and affect. His behavior is normal. Judgment and thought content normal.  Flat affect     Labs reviewed: Hospital Outpatient Visit on 07/21/2014  Component Date Value Ref Range Status  . LD, Fluid 07/21/2014 127* 3 - 23 U/L Final   Comment: (NOTE) Results should be evaluated in conjunction with serum values Performed at [facility]   . Fluid Type-FLDH 07/21/2014 Pleural R   Final  . Total protein, fluid 07/21/2014 3.9   Final   Comment: (NOTE) No normal range established for this test Results should be evaluated in conjunction with serum values Performed at [facility]   . Fluid Type-FTP 07/21/2014 Pleural R   Final  . Fluid Type-FCT 07/21/2014 Pleural R   Final  . Color, Fluid 07/21/2014 YELLOW* YELLOW Final  . Appearance, Fluid 07/21/2014 CLEAR  CLEAR Final  . WBC, Fluid 07/21/2014 131  0 - 1000 cu mm Final  . Neutrophil Count, Fluid 07/21/2014 10  0 - 25 % Final  . Lymphs, Fluid 07/21/2014 8   Final  .  Monocyte-Macrophage-Serous Fluid 07/21/2014 82  50 - 90 % Final  . Eos, Fluid 07/21/2014 0   Final  . Other Cells, Fluid 07/21/2014 CORRELATE WITH CYTOLOGY.   Final  . Specimen Description 07/21/2014 PLEURAL RIGHT   Final  . Special Requests 07/21/2014 NONE   Final  . Gram Stain 07/21/2014    Final                   Value:RARE WBC PRESENT,BOTH PMN AND MONONUCLEAR NO ORGANISMS SEEN Performed at [facility]   . Culture 07/21/2014    Final                   Value:NO GROWTH 1 DAY Performed at [facility]   . Report Status 07/21/2014 PENDING   Incomplete  Admission on 07/20/2014, Discharged on 07/20/2014  Component Date Value Ref Range Status  . Sodium 07/20/2014 140  137 - 147 mEq/L Final  . Potassium 07/20/2014 4.4  3.7 - 5.3 mEq/L Final  . Chloride 07/20/2014 103  96 - 112 mEq/L Final  . CO2 07/20/2014  24  19 - 32 mEq/L Final  . Glucose, Bld 07/20/2014 87  70 - 99 mg/dL Final  . BUN 07/20/2014 20  6 - 23 mg/dL Final  . Creatinine, Ser 07/20/2014 1.10  0.50 - 1.35 mg/dL Final  . Calcium 07/20/2014 9.5  8.4 - 10.5 mg/dL Final  . Total Protein 07/20/2014 6.2  6.0 - 8.3 g/dL Final  . Albumin 07/20/2014 3.3* 3.5 - 5.2 g/dL Final  . AST 07/20/2014 23  0 - 37 U/L Final  . ALT 07/20/2014 12  0 - 53 U/L Final  . Alkaline Phosphatase 07/20/2014 131* 39 - 117 U/L Final  . Total Bilirubin 07/20/2014 1.4* 0.3 - 1.2 mg/dL Final  . GFR calc non Af Amer 07/20/2014 58* >90 mL/min Final  . GFR calc Af Amer 07/20/2014 68* >90 mL/min Final   Comment: (NOTE) The eGFR has been calculated using the CKD EPI equation. This calculation has not been validated in all clinical situations. eGFR's persistently <90 mL/min signify possible Chronic Kidney Disease.   . Anion gap 07/20/2014 13  5 - 15 Final  . WBC 07/20/2014 5.4  4.0 - 10.5 K/uL Final  . RBC 07/20/2014 4.33  4.22 - 5.81 MIL/uL Final  . Hemoglobin 07/20/2014 13.7  13.0 - 17.0 g/dL Final  . HCT 07/20/2014 40.5  39.0 - 52.0 %  Final  . MCV 07/20/2014 93.5  78.0 - 100.0 fL Final  . MCH 07/20/2014 31.6  26.0 - 34.0 pg Final  . MCHC 07/20/2014 33.8  30.0 - 36.0 g/dL Final  . RDW 07/20/2014 14.1  11.5 - 15.5 % Final  . Platelets 07/20/2014 128* 150 - 400 K/uL Final  . Neutrophils Relative % 07/20/2014 77  43 - 77 % Final  . Neutro Abs 07/20/2014 4.2  1.7 - 7.7 K/uL Final  . Lymphocytes Relative 07/20/2014 12  12 - 46 % Final  . Lymphs Abs 07/20/2014 0.6* 0.7 - 4.0 K/uL Final  . Monocytes Relative 07/20/2014 9  3 - 12 % Final  . Monocytes Absolute 07/20/2014 0.5  0.1 - 1.0 K/uL Final  . Eosinophils Relative 07/20/2014 1  0 - 5 % Final  . Eosinophils Absolute 07/20/2014 0.0  0.0 - 0.7 K/uL Final  . Basophils Relative 07/20/2014 1  0 - 1 % Final  . Basophils Absolute 07/20/2014 0.0  0.0 - 0.1 K/uL Final  . Color, Urine 07/20/2014 YELLOW  YELLOW Final  . APPearance 07/20/2014 CLEAR  CLEAR Final  . Specific Gravity, Urine 07/20/2014 1.012  1.005 - 1.030 Final  . pH 07/20/2014 6.0  5.0 - 8.0 Final  . Glucose, UA 07/20/2014 NEGATIVE  NEGATIVE mg/dL Final  . Hgb urine dipstick 07/20/2014 NEGATIVE  NEGATIVE Final  . Bilirubin Urine 07/20/2014 NEGATIVE  NEGATIVE Final  . Ketones, ur 07/20/2014 15* NEGATIVE mg/dL Final  . Protein, ur 07/20/2014 NEGATIVE  NEGATIVE mg/dL Final  . Urobilinogen, UA 07/20/2014 1.0  0.0 - 1.0 mg/dL Final  . Nitrite 07/20/2014 NEGATIVE  NEGATIVE Final  . Leukocytes, UA 07/20/2014 NEGATIVE  NEGATIVE Final   MICROSCOPIC NOT DONE ON URINES WITH NEGATIVE PROTEIN, BLOOD, LEUKOCYTES, NITRITE, OR GLUCOSE <1000 mg/dL.  Marland Kitchen Pro B Natriuretic peptide (BNP) 07/20/2014 383.1  0 - 450 pg/mL Final  Appointment on 06/06/2014  Component Date Value Ref Range Status  . Glucose 06/06/2014 82  65 - 99 mg/dL Final  . BUN 06/06/2014 29* 8 - 27 mg/dL Final  . Creatinine, Ser 06/06/2014 1.36* 0.76 - 1.27 mg/dL Final  . GFR  calc non Af Amer 06/06/2014 46* >59 mL/min/1.73 Final  . GFR calc Af Amer 06/06/2014 54* >59  mL/min/1.73 Final  . BUN/Creatinine Ratio 06/06/2014 21  10 - 22 Final  . Sodium 06/06/2014 140  134 - 144 mmol/L Final  . Potassium 06/06/2014 4.4  3.5 - 5.2 mmol/L Final  . Chloride 06/06/2014 101  97 - 108 mmol/L Final  . CO2 06/06/2014 23  18 - 29 mmol/L Final  . Calcium 06/06/2014 9.6  8.6 - 10.2 mg/dL Final  . Total Protein 06/06/2014 6.0  6.0 - 8.5 g/dL Final  . Albumin 06/06/2014 4.0  3.5 - 4.7 g/dL Final  . Globulin, Total 06/06/2014 2.0  1.5 - 4.5 g/dL Final  . Albumin/Globulin Ratio 06/06/2014 2.0  1.1 - 2.5 Final  . Total Bilirubin 06/06/2014 0.9  0.0 - 1.2 mg/dL Final  . Alkaline Phosphatase 06/06/2014 96  39 - 117 IU/L Final  . AST 06/06/2014 23  0 - 40 IU/L Final  . ALT 06/06/2014 11  0 - 44 IU/L Final  . Uric Acid 06/06/2014 4.7  3.7 - 8.6 mg/dL Final              Therapeutic target for gout patients: <6.0  Admission on 05/20/2014, Discharged on 05/22/2014  Component Date Value Ref Range Status  . Color, Urine 05/20/2014 YELLOW  YELLOW Final  . APPearance 05/20/2014 CLEAR  CLEAR Final  . Specific Gravity, Urine 05/20/2014 1.017  1.005 - 1.030 Final  . pH 05/20/2014 5.5  5.0 - 8.0 Final  . Glucose, UA 05/20/2014 NEGATIVE  NEGATIVE mg/dL Final  . Hgb urine dipstick 05/20/2014 NEGATIVE  NEGATIVE Final  . Bilirubin Urine 05/20/2014 NEGATIVE  NEGATIVE Final  . Ketones, ur 05/20/2014 NEGATIVE  NEGATIVE mg/dL Final  . Protein, ur 05/20/2014 NEGATIVE  NEGATIVE mg/dL Final  . Urobilinogen, UA 05/20/2014 0.2  0.0 - 1.0 mg/dL Final  . Nitrite 05/20/2014 NEGATIVE  NEGATIVE Final  . Leukocytes, UA 05/20/2014 NEGATIVE  NEGATIVE Final   MICROSCOPIC NOT DONE ON URINES WITH NEGATIVE PROTEIN, BLOOD, LEUKOCYTES, NITRITE, OR GLUCOSE <1000 mg/dL.  . WBC 05/20/2014 5.6  4.0 - 10.5 K/uL Final  . RBC 05/20/2014 3.89* 4.22 - 5.81 MIL/uL Final  . Hemoglobin 05/20/2014 12.1* 13.0 - 17.0 g/dL Final  . HCT 05/20/2014 36.7* 39.0 - 52.0 % Final  . MCV 05/20/2014 94.3  78.0 - 100.0 fL Final  .  MCH 05/20/2014 31.1  26.0 - 34.0 pg Final  . MCHC 05/20/2014 33.0  30.0 - 36.0 g/dL Final  . RDW 05/20/2014 14.9  11.5 - 15.5 % Final  . Platelets 05/20/2014 110* 150 - 400 K/uL Final   PLATELET COUNT CONFIRMED BY SMEAR  . Neutrophils Relative % 05/20/2014 68  43 - 77 % Final  . Neutro Abs 05/20/2014 3.8  1.7 - 7.7 K/uL Final  . Lymphocytes Relative 05/20/2014 19  12 - 46 % Final  . Lymphs Abs 05/20/2014 1.1  0.7 - 4.0 K/uL Final  . Monocytes Relative 05/20/2014 12  3 - 12 % Final  . Monocytes Absolute 05/20/2014 0.7  0.1 - 1.0 K/uL Final  . Eosinophils Relative 05/20/2014 1  0 - 5 % Final  . Eosinophils Absolute 05/20/2014 0.0  0.0 - 0.7 K/uL Final  . Basophils Relative 05/20/2014 1  0 - 1 % Final  . Basophils Absolute 05/20/2014 0.0  0.0 - 0.1 K/uL Final  . Sodium 05/20/2014 141  137 - 147 mEq/L Final  . Potassium 05/20/2014 4.2  3.7 -  5.3 mEq/L Final  . Chloride 05/20/2014 105  96 - 112 mEq/L Final  . CO2 05/20/2014 22  19 - 32 mEq/L Final  . Glucose, Bld 05/20/2014 101* 70 - 99 mg/dL Final  . BUN 05/20/2014 26* 6 - 23 mg/dL Final  . Creatinine, Ser 05/20/2014 1.30  0.50 - 1.35 mg/dL Final  . Calcium 05/20/2014 9.2  8.4 - 10.5 mg/dL Final  . Total Protein 05/20/2014 6.5  6.0 - 8.3 g/dL Final  . Albumin 05/20/2014 3.5  3.5 - 5.2 g/dL Final  . AST 05/20/2014 27  0 - 37 U/L Final  . ALT 05/20/2014 15  0 - 53 U/L Final  . Alkaline Phosphatase 05/20/2014 99  39 - 117 U/L Final  . Total Bilirubin 05/20/2014 0.5  0.3 - 1.2 mg/dL Final  . GFR calc non Af Amer 05/20/2014 48* >90 mL/min Final  . GFR calc Af Amer 05/20/2014 55* >90 mL/min Final   Comment: (NOTE)                          The eGFR has been calculated using the CKD EPI equation.                          This calculation has not been validated in all clinical situations.                          eGFR's persistently <90 mL/min signify possible Chronic Kidney                          Disease.  . Anion gap 05/20/2014 14  5 - 15  Final  . Troponin I 05/20/2014 <0.30  <0.30 ng/mL Final   Comment:                                 Due to the release kinetics of cTnI,                          a negative result within the first hours                          of the onset of symptoms does not rule out                          myocardial infarction with certainty.                          If myocardial infarction is still suspected,                          repeat the test at appropriate intervals.  . Pro B Natriuretic peptide (BNP) 05/20/2014 283.4  0 - 450 pg/mL Final  . WBC 05/21/2014 4.4  4.0 - 10.5 K/uL Final  . RBC 05/21/2014 3.87* 4.22 - 5.81 MIL/uL Final  . Hemoglobin 05/21/2014 11.9* 13.0 - 17.0 g/dL Final  . HCT 05/21/2014 36.0* 39.0 - 52.0 % Final  . MCV 05/21/2014 93.0  78.0 - 100.0 fL Final  . MCH 05/21/2014 30.7  26.0 - 34.0 pg Final  . MCHC 05/21/2014 33.1  30.0 -  36.0 g/dL Final  . RDW 05/21/2014 15.0  11.5 - 15.5 % Final  . Platelets 05/21/2014 115* 150 - 400 K/uL Final   CONSISTENT WITH PREVIOUS RESULT  . Sodium 05/21/2014 141  137 - 147 mEq/L Final  . Potassium 05/21/2014 4.1  3.7 - 5.3 mEq/L Final  . Chloride 05/21/2014 106  96 - 112 mEq/L Final  . CO2 05/21/2014 24  19 - 32 mEq/L Final  . Glucose, Bld 05/21/2014 89  70 - 99 mg/dL Final  . BUN 05/21/2014 22  6 - 23 mg/dL Final  . Creatinine, Ser 05/21/2014 1.26  0.50 - 1.35 mg/dL Final  . Calcium 05/21/2014 8.9  8.4 - 10.5 mg/dL Final  . GFR calc non Af Amer 05/21/2014 49* >90 mL/min Final  . GFR calc Af Amer 05/21/2014 57* >90 mL/min Final   Comment: (NOTE)                          The eGFR has been calculated using the CKD EPI equation.                          This calculation has not been validated in all clinical situations.                          eGFR's persistently <90 mL/min signify possible Chronic Kidney                          Disease.  . Anion gap 05/21/2014 11  5 - 15 Final  . Specimen Description 05/21/2014 PLEURAL RIGHT    Final  . Special Requests 05/21/2014 NONE   Final  . Gram Stain 05/21/2014    Final                   Value:NO WBC SEEN                         NO ORGANISMS SEEN                         Performed at [facility]  . Culture 05/21/2014    Final                   Value:NO GROWTH 3 DAYS                         Performed at [facility]  . Report Status 05/21/2014 05/24/2014 FINAL   Final  . Fluid Type-FCT 05/21/2014 Body Fluid   Final   PLEURAL  . Color, Fluid 05/21/2014 YELLOW* YELLOW Final  . Appearance, Fluid 05/21/2014 CLEAR  CLEAR Final  . WBC, Fluid 05/21/2014 99  0 - 1000 cu mm Final  . Neutrophil Count, Fluid 05/21/2014 8  0 - 25 % Final  . Lymphs, Fluid 05/21/2014 27   Final  . Monocyte-Macrophage-Serous Fluid 05/21/2014 65  50 - 90 % Final  . Other Cells, Fluid 05/21/2014 CORRELATE WITH CYTOLOGY.   Final  . LD, Fluid 05/21/2014 105* 3 - 23 U/L Final   Performed at [name]  . Fluid Type-FLDH 05/21/2014 Pleural Fld   Final  . Glucose, Fluid 05/21/2014 89   Final   Comment:  FLUID GLUCOSE LEVELS OF <60                          mg/dL OR VALUES OF 40 mg/dL                          LESS THAN A SIMULTANEOUS                          SERUM LEVEL ARE CONSIDERED                          DECREASED.                          Performed at [facility]  . Fluid Type-FGLU 05/21/2014 Pleural Fld   Final  . Total protein, fluid 05/21/2014 4.1   Final   Comment: NO NORMAL RANGE ESTABLISHED FOR THIS TEST                          Performed at [facility]  . Fluid Type-FTP 05/21/2014 Pleural Fld   Final  . Total Protein 05/21/2014 6.0  6.0 - 8.3 g/dL Final  . Albumin 05/21/2014 3.3* 3.5 - 5.2 g/dL Final  . AST 05/21/2014 25  0 - 37 U/L Final  . ALT 05/21/2014 13  0 - 53 U/L Final  . Alkaline Phosphatase 05/21/2014 103  39 - 117 U/L Final  . Total Bilirubin 05/21/2014 0.9  0.3 - 1.2 mg/dL Final  . Bilirubin, Direct  05/21/2014 0.2  0.0 - 0.3 mg/dL Final  . Indirect Bilirubin 05/21/2014 0.7  0.3 - 0.9 mg/dL Final  . LDH 05/21/2014 215  94 - 250 U/L Final  . Anti-Nuclear Ab, IgG 05/21/2014 None Detected  None Detected Final   Comment: (NOTE)                          Test performed on pleural fluid. The validity of testing this                           fluid, the clinical significance and criteria for the                           interpretation of these antibody levels have not been established.                          No antibodies to Anti-Nuclear Antibodies (ANA) detected. No                           further testing will be performed.                          INTERPRETIVE INFORMATION: Anti-Nuclear Antibodies (ANA), IgG by                           ELISA                          ANA specimens are screened using enzyme-linked immunosorbent assay                           (  ELISA) methodology. All ELISA results reported as Detected are                           further tested by indirect fluorescent assay (IFA) using HEp-2                           substrate with an IgG-specific conjugate. The ANA ELISA screen is                           designed to detect antibodies against dsDNA, histone, SS-A (Ro),                           SS-B (La), Smith, snRNP/Sm, Scl-70, Jo-1, centromere, and an                           extract of lysed HEp-2 cells. ANA ELISA assays have been reported                           to have lower sensitivities for antibodies associated with                           nucleolar and speckled ANA-IFA patterns.                          Performed at [facility]  . Sodium 05/22/2014 141  137 - 147 mEq/L Final  . Potassium 05/22/2014 4.3  3.7 - 5.3 mEq/L Final  . Chloride 05/22/2014 106  96 - 112 mEq/L Final  . CO2 05/22/2014 23  19 - 32 mEq/L Final  . Glucose, Bld 05/22/2014 82  70 - 99 mg/dL Final  . BUN 05/22/2014 24* 6 - 23 mg/dL Final  . Creatinine, Ser 05/22/2014 1.22  0.50  - 1.35 mg/dL Final  . Calcium 05/22/2014 8.9  8.4 - 10.5 mg/dL Final  . GFR calc non Af Amer 05/22/2014 51* >90 mL/min Final  . GFR calc Af Amer 05/22/2014 60* >90 mL/min Final   Comment: (NOTE)                          The eGFR has been calculated using the CKD EPI equation.                          This calculation has not been validated in all clinical situations.                          eGFR's persistently <90 mL/min signify possible Chronic Kidney                          Disease.  . Anion gap 05/22/2014 12  5 - 15 Final  . Ammonia 05/22/2014 25  11 - 60 umol/L Final  . Vitamin B-12 05/22/2014 569  211 - 911 pg/mL Final   Performed at [facility]  . TSH 05/22/2014 3.670  0.350 - 4.500 uIU/mL Final   Performed at [facility]  . RBC Folate 05/22/2014 1044* >280 ng/mL Final   Comment: Reference  range not established for pediatric patients.                          Performed at [facility]     Assessment/Plan  1. Dysphagia ? Neurologic issue - Ambulatory referral to ENT  2. Essential hypertension controlled  3. Dysphonia ? Vocal cord paralysis - Ambulatory referral to ENT  4. Ectropion of right lower eyelid - Ambulatory referral to Ophthalmology

## 2014-07-22 NOTE — Assessment & Plan Note (Signed)
Given dysphagia and dysphonia, I would worry about bulbar weakness-as a unifying diagnosis. The waxing and waning of symptoms, and recurrent nature makes me wonder about myasthenia We'll proceed with neurological evaluation, I have asked him to use a mechanical soft diet until we can obtain a swallow evaluation I do not think that this is related to the pleural effusion, unless it is some sort of a paraneoplastic syndrome

## 2014-07-22 NOTE — Patient Instructions (Signed)
You may have neurological weakness causing dysphonia & difficulty swallowing Swallow evaluation with speech therapist present Use mechanical soft diet & thickener meantime Neurological consultation If shortness of breath due to fluid reaccumulation, we can plan for indwelling catheter placement

## 2014-07-22 NOTE — Addendum Note (Signed)
Addended by: York RamGAY, Akeelah Seppala on: 07/22/2014 03:17 PM   Modules accepted: Orders

## 2014-07-22 NOTE — Progress Notes (Signed)
Subjective:    [name], male    DOB: [birth year], 80 y.o.   MRN: [id number]  HPI  80 year old man, never smoker referred for evaluation of  chronic right effusion. PMH of CKD stage III,diastolic CHF, BPH   Significant tests/ events  He was found to have an exudative effusion Feb 2015 with negative cytology, when he was admitted for hip fracture  -05/21/14--Repeat thoracocentesis with LDH and protein--1.2L removed  07/21/14 1.5 L thora -after  ED visit Review of pleural fluid chemistry shows high-protein ranging from 3.9-4.1, and normal LDH from 105-127. Chest x-ray in 02/2013 was clear.  -05/22/14 echocardiogram--EF 60%, grade 1 diastolic dysfx, No WMA -Venous duplex --negative  -TSH 3.670 -UA was neg for protein  He was admitted 04/2014 for mild confusion and dysarthria, repeated head imaging has been negative including MRI. Attributed to UTI in 09/2013. He now again presents with dysphonia and dysphagia to solids over the Thanksgiving weekend. He is accompanied by his daughter who provides most of the history-she reports some waxing and waning of symptoms, appears to be worse towards evening, he does ambulate with a walker, he denies stiffness in his extremities. He reports one episode of choking. His weight has dropped from about 6 pounds from 120 12115. He has bilateral hydronephrosis attributed to an enlarged prostate (Nesi)  Past Medical History  Diagnosis Date  . Gout   . Insomnia   . BPH (benign prostatic hyperplasia)   . Kidney disorder   . Arthritis   . Hypertension     patient denies on 10/25/12 on no meds   . Complication of anesthesia     hard to wake up -2011 after hernia surgery   . Anemia   . CHF (congestive heart failure)   . Prostate disorder   . Hernia, abdominal     Past Surgical History  Procedure Laterality Date  . Cataract extraction    . Cholecystectomy    . Hernia repair  02/22/11    right  . Joint replacement      fractured  hip  . Total hip arthroplasty Right 10/29/2012    Procedure: CONVERSION OF PREVIOUS HIP SURGERY TO RIGHT TOTAL HIP AND REMOVAL OF IM NAIL ;  Surgeon: Shelda PalMatthew D Olin, MD;  Location: [facility];  Service: Orthopedics;  Laterality: Right;  . Esophagogastroduodenoscopy N/A 11/16/2012    Procedure: ESOPHAGOGASTRODUODENOSCOPY (EGD);  Surgeon: Theda BelfastPatrick D Hung, MD;  [facility];  Service: Endoscopy;  Laterality: N/A;  . Intramedullary (im) nail intertrochanteric Left 10/17/2013    Procedure: INTRAMEDULLARY (IM) NAIL INTERTROCHANTRIC;  Surgeon: Shelda PalMatthew D Olin, MD;  Location: [facility];  Service: Orthopedics;  Laterality: Left;    Allergies  Allergen Reactions  . Sertraline Nausea Only  . Nsaids Other (See Comments)    history of renal failure    History   Social History  . Marital Status: Married    Spouse Name: N/A    Number of Children: N/A  . Years of Education: N/A   Occupational History  . Not on file.   Social History Main Topics  . Smoking status: Never Smoker   . Smokeless tobacco: Never Used  . Alcohol Use: No  . Drug Use: No  . Sexual Activity: No   Other Topics Concern  . Not on file   Social History Narrative   Worked for [employer]   Also did map drawing    Family History  Problem Relation Age of Onset  . Heart failure  Mother     heart  . Heart failure Father     fell and hit head  . Cancer Brother     colon  . Diabetes Daughter       Review of Systems neg for any significant sore throat, dysphagia, itching, sneezing, nasal congestion or excess/ purulent secretions, fever, chills, sweats, unintended wt loss, pleuritic or exertional cp, hempoptysis, orthopnea pnd or change in chronic leg swelling. Also denies presyncope, palpitations, heartburn, abdominal pain, nausea, vomiting, diarrhea or change in bowel or urinary habits, dysuria,hematuria, rash, arthralgias, visual complaints, headache, numbness weakness or ataxia.     Objective:   Physical Exam  Gen.  Pleasant, well-nourished, in no distress, normal affect ENT - no lesions, no post nasal drip, ectropion Neck: No JVD, no thyromegaly, no carotid bruits Lungs: no use of accessory muscles, no dullness to percussion, clear without rales or rhonchi  Cardiovascular: Rhythm regular, heart sounds  normal, no murmurs or gallops, no peripheral edema Abdomen: soft and non-tender, no hepatosplenomegaly, BS normal. Musculoskeletal: No deformities, no cyanosis or clubbing Neuro:  alert, non focal, power 4+/5, weakness of extraocular muscles-poor eye closure, no other cranial nerve deficit, both sides of palate moved well        Assessment & Plan:

## 2014-07-22 NOTE — Addendum Note (Signed)
Addended by: York RamGAY, Antoinett Dorman on: 07/22/2014 03:04 PM   Modules accepted: Orders

## 2014-07-22 NOTE — Assessment & Plan Note (Signed)
Unclear cause of exudative effusion-ideally would require pleural biopsy. Both patient and daughter would not want surgical intervention. I wonder if this could be urino- thorax related to his hydronephrosis, we'll try to add on creatinine measurement to pleural fluid that was drawn 2 days ago. We also discussed placement of a Pleurx catheter- I would pursue this only if he develops significant dyspnea. He seems to tolerate the infusion quite well without dyspnea so far

## 2014-07-23 ENCOUNTER — Other Ambulatory Visit: Payer: Self-pay | Admitting: Pulmonary Disease

## 2014-07-23 ENCOUNTER — Ambulatory Visit (HOSPITAL_COMMUNITY)
Admission: RE | Admit: 2014-07-23 | Discharge: 2014-07-23 | Disposition: A | Discharge status: Home / Self Care | Payer: Medicare Other | Source: Ambulatory Visit | Attending: Internal Medicine | Admitting: Internal Medicine

## 2014-07-23 ENCOUNTER — Telehealth: Payer: Self-pay | Admitting: Pulmonary Disease

## 2014-07-23 ENCOUNTER — Other Ambulatory Visit (HOSPITAL_COMMUNITY): Payer: Self-pay | Admitting: Pulmonary Disease

## 2014-07-23 ENCOUNTER — Ambulatory Visit (HOSPITAL_COMMUNITY)
Admission: RE | Admit: 2014-07-23 | Discharge: 2014-07-23 | Disposition: A | Discharge status: Home / Self Care | Payer: Medicare Other | Source: Ambulatory Visit | Attending: Pulmonary Disease | Admitting: Pulmonary Disease

## 2014-07-23 DIAGNOSIS — D649 Anemia, unspecified: Secondary | ICD-10-CM | POA: Insufficient documentation

## 2014-07-23 DIAGNOSIS — R1310 Dysphagia, unspecified: Secondary | ICD-10-CM

## 2014-07-23 DIAGNOSIS — G4700 Insomnia, unspecified: Secondary | ICD-10-CM | POA: Insufficient documentation

## 2014-07-23 DIAGNOSIS — I509 Heart failure, unspecified: Secondary | ICD-10-CM | POA: Insufficient documentation

## 2014-07-23 DIAGNOSIS — Z9049 Acquired absence of other specified parts of digestive tract: Secondary | ICD-10-CM | POA: Insufficient documentation

## 2014-07-23 DIAGNOSIS — M109 Gout, unspecified: Secondary | ICD-10-CM | POA: Insufficient documentation

## 2014-07-23 DIAGNOSIS — J90 Pleural effusion, not elsewhere classified: Secondary | ICD-10-CM | POA: Insufficient documentation

## 2014-07-23 DIAGNOSIS — M1990 Unspecified osteoarthritis, unspecified site: Secondary | ICD-10-CM | POA: Insufficient documentation

## 2014-07-23 DIAGNOSIS — I10 Essential (primary) hypertension: Secondary | ICD-10-CM | POA: Insufficient documentation

## 2014-07-23 DIAGNOSIS — Z96641 Presence of right artificial hip joint: Secondary | ICD-10-CM | POA: Insufficient documentation

## 2014-07-23 DIAGNOSIS — R1314 Dysphagia, pharyngoesophageal phase: Secondary | ICD-10-CM

## 2014-07-23 DIAGNOSIS — R1311 Dysphagia, oral phase: Secondary | ICD-10-CM | POA: Insufficient documentation

## 2014-07-23 DIAGNOSIS — N289 Disorder of kidney and ureter, unspecified: Secondary | ICD-10-CM | POA: Insufficient documentation

## 2014-07-23 DIAGNOSIS — K469 Unspecified abdominal hernia without obstruction or gangrene: Secondary | ICD-10-CM | POA: Insufficient documentation

## 2014-07-23 DIAGNOSIS — N400 Enlarged prostate without lower urinary tract symptoms: Secondary | ICD-10-CM | POA: Insufficient documentation

## 2014-07-23 NOTE — Telephone Encounter (Signed)
Let the family know that he may need a feeding tube in order to get nutrition and fluids.  They need to call his primary physician's office in the morning, and see how they wish to handle this.  They can refer to a GI specialist if the decision is made to place a feeding tube.   Let them know that I am filling in for Dr Vassie LollAlva today, and make sure he gets a copy of this message.

## 2014-07-23 NOTE — Telephone Encounter (Signed)
[facility] wants to speak to the nurse about this pt [phone]

## 2014-07-23 NOTE — Procedures (Signed)
Objective Swallowing Evaluation: Modified Barium Swallowing Study  Patient Details  Name: [name] MRN: [id number] Date of Birth: February 12, 1925  Today's Date: 07/23/2014 Time: 1333-1400 SLP Time Calculation (min) (ACUTE ONLY): 27 min  Past Medical History:  Past Medical History  Diagnosis Date  . Gout   . Insomnia   . BPH (benign prostatic hyperplasia)   . Kidney disorder   . Arthritis   . Hypertension     patient denies on 10/25/12 on no meds   . Complication of anesthesia     hard to wake up -2011 after hernia surgery   . Anemia   . CHF (congestive heart failure)   . Prostate disorder   . Hernia, abdominal    Past Surgical History:  Past Surgical History  Procedure Laterality Date  . Cataract extraction    . Cholecystectomy    . Hernia repair  02/22/11    right  . Joint replacement      fractured hip  . Total hip arthroplasty Right 10/29/2012    Procedure: CONVERSION OF PREVIOUS HIP SURGERY TO RIGHT TOTAL HIP AND REMOVAL OF IM NAIL ;  Surgeon: Shelda Pal, MD;  Location: [facility];  Service: Orthopedics;  Laterality: Right;  . Esophagogastroduodenoscopy N/A 11/16/2012    Procedure: ESOPHAGOGASTRODUODENOSCOPY (EGD);  Surgeon: Theda Belfast, MD;  [facility];  Service: Endoscopy;  Laterality: N/A;  . Intramedullary (im) nail intertrochanteric Left 10/17/2013    Procedure: INTRAMEDULLARY (IM) NAIL INTERTROCHANTRIC;  Surgeon: Shelda Pal, MD;  Location: [facility];  Service: Orthopedics;  Laterality: Left;   HPI:  80 yo male referred by Dr Louisa Second MBS due to concern for aspiration.  Pt PMH + for CHF, recurrent pleural effusion requiring thoracentesis in 2015 :  dates 11/30, 9/30, 2/27.  Pt has fallen in Feb 2015 and underwent an MRI 05/21/14 due to dysarthria.  MRI 9/30 showed atrophy - ? anterior right frontal lobe - susceptibility most compatible with remote blood products.  EGD 10/21/2012 completed by Dr Elnoria Howard showed 2 cm hiatal hernia, mild esophagitis and Schtazki's  ring.   SLP saw pt 10/2013 at that time and c/o regurgitation of food at home.  Pt's daughter [name] is present currently and reports pt has been unable to eat since Saturday as he can't get anything down including medications.  Sudden worsening of dysphagia reported by pt/daugher on Thanksgiving day.  In addition, significant amounts of phlegm and mucous present per daughter and pt just had a thoracentesis on Monday 11/30.       Assessment / Plan / Recommendation Clinical Impression  Dysphagia Diagnosis: Moderate oral phase dysphagia;Severe pharyngeal phase dysphagia;Severe cervical esophageal phase dysphagia  Clinical impression: Pt has a moderate oral and gross pharyngeal, cervical esophageal dysphagia with nearly absent muscle contraction.  Only a few boluses of liquids were provided to pt due to severity of dysphagia.  Gross pharyngeal stasis with penetration/aspiration noted with both consistences.  Fortunately pt could expectorate portion of vallecular residuals that he could not swallow.    Pt's gross dysphagia with sudden onset per daughter/pt appears consistent with neurological source.  Pt is at very high aspiration/malnutrition/dehydration risk with any intake.   Using live video, SLP educated pt/daughter to findings, concerns and recommendations.     SLP inquired to pt and daughter if determination re: nutrition had been previously decided if dysphagia becomes severe - to which they denied.  Advised pt/daughter that feeding tubes do not prevent aspiration but provide nutrition.  Reinforced need  for adequate oral care due to aspiration of secretions.    Phoned MD office and spoke to RN relaying concerns for level of pt's dysphagia.      Treatment Recommendation  Defer treatment plan to SLP at (Comment) (defer for treatment indication to neurology )    Diet Recommendation NPO (small amounts of nectar liquids for hydration needs until can see MD for help in determining means of nutrition-  pt reports nectar liquids decrease cough and are more comfortable for pt to consume)   Liquid Administration via: Cup;Straw Medication Administration:  (? if liquid form would be able to be obtained) Supervision: Patient able to self feed Compensations:  (multiple swallows, cough and expectorate, very small amounts at a time) Postural Changes and/or Swallow Maneuvers: Seated upright 90 degrees;Upright 30-60 min after meal    Other  Recommendations Recommended Consults: Other (Comment) (consider neuro eval expedited ) Oral Care Recommendations: Oral care Q4 per protocol Other Recommendations: Order thickener from pharmacy   Follow Up Recommendations         General Date of Onset: 07/23/14 HPI: 80 yo male referred by Dr Louisa SecondAfor MBS due to concern for aspiration.  Pt PMH + for CHF, recurrent pleural effusion requiring thoracentesis in 2015 :  dates 11/30, 9/30, 2/27.  Pt has fallen in Feb 2015 and underwent an MRI 05/21/14 due to dysarthria.  MRI 9/30 showed atrophy - ? anterior right frontal lobe - susceptibility most compatible with remote blood products.  EGD 10/21/2012 completed by Dr Elnoria HowardHung showed 2 cm hiatal hernia, mild esophagitis and Schtazki's ring.   SLP saw pt 10/2013 at that time and c/o regurgitation of food at home.  Pt's daughter Jordan DavenportSandra is present currently and reports pt has been unable to eat since Saturday as he can't get anything down including medications.  Sudden worsening of dysphagia reported by pt/daugher on Thanksgiving day.  In addition, significant amounts of phlegm and mucous present per daughter and pt just had a thoracentesis on Monday 11/30.   Type of Study: Modified Barium Swallowing Study Reason for Referral: Objectively evaluate swallowing function Previous Swallow Assessment: see HPI Diet Prior to this Study:  (pt has been unable to eat since the previous Saturday) Temperature Spikes Noted: No Respiratory Status: Room air History of Recent Intubation:  No Behavior/Cognition: Alert;Cooperative;Pleasant mood;Confused Oral Cavity - Dentition: Adequate natural dentition Oral Motor / Sensory Function:  (? mild left sided weakness, ? decreased left facial movement, hypernasal speech concerning for velopharyngeal incompetence - ? mild palatal deviation to right on phonation) Self-Feeding Abilities: Able to feed self Patient Positioning: Upright in chair Baseline Vocal Quality: Hoarse;Low vocal intensity (intermittent wet during and after evaluation) Volitional Cough: Strong Volitional Swallow: Unable to elicit Anatomy: Within functional limits Pharyngeal Secretions: Not observed secondary MBS    Reason for Referral Objectively evaluate swallowing function   Oral Phase Oral Preparation/Oral Phase Oral Phase: Impaired Oral - Nectar Oral - Nectar Teaspoon: Decreased velopharyngeal closure;Reduced posterior propulsion;Piecemeal swallowing;Lingual/palatal residue Oral - Thin Oral - Thin Teaspoon: Decreased velopharyngeal closure;Reduced posterior propulsion;Piecemeal swallowing;Lingual/palatal residue Oral - Thin Cup: Decreased velopharyngeal closure;Reduced posterior propulsion;Piecemeal swallowing   Pharyngeal Phase Pharyngeal Phase Pharyngeal Phase: Impaired Pharyngeal - Nectar Pharyngeal - Nectar Teaspoon: Reduced anterior laryngeal mobility;Reduced epiglottic inversion;Reduced pharyngeal peristalsis;Reduced laryngeal elevation;Reduced airway/laryngeal closure;Reduced tongue base retraction;Pharyngeal residue - valleculae;Pharyngeal residue - pyriform sinuses;Pharyngeal residue - posterior pharnyx;Penetration/Aspiration during swallow;Penetration/Aspiration after swallow Penetration/Aspiration details (nectar teaspoon): Material enters airway, passes BELOW cords without attempt by patient to eject out (silent aspiration) Pharyngeal -  Thin Pharyngeal - Thin Teaspoon: Reduced epiglottic inversion;Reduced pharyngeal peristalsis;Reduced anterior  laryngeal mobility;Reduced laryngeal elevation;Reduced airway/laryngeal closure;Reduced tongue base retraction;Penetration/Aspiration during swallow;Penetration/Aspiration after swallow;Trace aspiration;Pharyngeal residue - valleculae;Pharyngeal residue - pyriform sinuses Penetration/Aspiration details (thin teaspoon): Material enters airway, passes BELOW cords without attempt by patient to eject out (silent aspiration) Pharyngeal - Thin Cup: Reduced laryngeal elevation;Reduced anterior laryngeal mobility;Reduced epiglottic inversion;Reduced pharyngeal peristalsis;Reduced tongue base retraction;Reduced airway/laryngeal closure;Trace aspiration;Pharyngeal residue - valleculae;Pharyngeal residue - pyriform sinuses;Penetration/Aspiration during swallow;Penetration/Aspiration after swallow Penetration/Aspiration details (thin cup): Material enters airway, passes BELOW cords without attempt by patient to eject out (silent aspiration)  Cervical Esophageal Phase    GO    Cervical Esophageal Phase Cervical Esophageal Phase: Impaired Cervical Esophageal Phase - Nectar Nectar Teaspoon: Reduced cricopharyngeal relaxation Cervical Esophageal Phase - Thin Thin Teaspoon: Reduced cricopharyngeal relaxation Thin Cup: Reduced cricopharyngeal relaxation    Functional Assessment Tool Used: mbs, clinical judgement Functional Limitations: Swallowing Swallow Current Status ([id number]): At least 80 percent but less than 100 percent impaired, limited or restricted Swallow Goal Status [phone]([id number]): At least 80 percent but less than 100 percent impaired, limited or restricted Swallow Discharge Status [phone]([id number]): At least 80 percent but less than 100 percent impaired, limited or restricted    Mills KollerKimball, Ahmia Colford Ann Shuntel Fishburn, MS [facility] SLP [phone]

## 2014-07-23 NOTE — Telephone Encounter (Signed)
Spoke with [name] and [facility]  She verbalized understanding  Will forward to RA for him to be made aware

## 2014-07-23 NOTE — Telephone Encounter (Signed)
Called to [facility], speech therapy. Tammy stated she preformed the modified barium swallow on pt today and pt is grossly weak and almost has an absence swallow. Called and spoke to pt's daughter, [name]. Pt has not eaten or taken oral medication since 11/28, pt has been spoon feeding himself Boost and has been coughing after each spoon. Advised [name] to hold off on intake till we hear back from the physician.  Pt went to ED on 11/29 for recurrent pleural effusion, pt was having difficulty swallowing and speaking at that time as well. Pt was seen by RA on 07/22/14. RA is unavailable this afternoon, will send to doc of the day.   KC please advise.

## 2014-07-24 ENCOUNTER — Inpatient Hospital Stay (HOSPITAL_COMMUNITY): Payer: Medicare Other

## 2014-07-24 ENCOUNTER — Encounter (HOSPITAL_COMMUNITY): Payer: Self-pay | Admitting: Radiology

## 2014-07-24 ENCOUNTER — Ambulatory Visit: Payer: Medicare Other | Admitting: Adult Health

## 2014-07-24 ENCOUNTER — Ambulatory Visit (HOSPITAL_COMMUNITY): Payer: Medicare Other

## 2014-07-24 ENCOUNTER — Inpatient Hospital Stay (HOSPITAL_COMMUNITY)
Admission: AD | Admit: 2014-07-24 | Discharge: 2014-08-01 | DRG: 391 | Disposition: A | Discharge status: Home / Self Care | Payer: Medicare Other | Source: Ambulatory Visit | Attending: Family Medicine | Admitting: Family Medicine

## 2014-07-24 DIAGNOSIS — E870 Hyperosmolality and hypernatremia: Secondary | ICD-10-CM | POA: Diagnosis present

## 2014-07-24 DIAGNOSIS — R531 Weakness: Secondary | ICD-10-CM | POA: Insufficient documentation

## 2014-07-24 DIAGNOSIS — G7000 Myasthenia gravis without (acute) exacerbation: Secondary | ICD-10-CM | POA: Diagnosis present

## 2014-07-24 DIAGNOSIS — Z6820 Body mass index (BMI) 20.0-20.9, adult: Secondary | ICD-10-CM | POA: Diagnosis not present

## 2014-07-24 DIAGNOSIS — N400 Enlarged prostate without lower urinary tract symptoms: Secondary | ICD-10-CM | POA: Diagnosis present

## 2014-07-24 DIAGNOSIS — K5900 Constipation, unspecified: Secondary | ICD-10-CM | POA: Diagnosis present

## 2014-07-24 DIAGNOSIS — Z9849 Cataract extraction status, unspecified eye: Secondary | ICD-10-CM | POA: Diagnosis not present

## 2014-07-24 DIAGNOSIS — I5031 Acute diastolic (congestive) heart failure: Secondary | ICD-10-CM | POA: Diagnosis present

## 2014-07-24 DIAGNOSIS — M109 Gout, unspecified: Secondary | ICD-10-CM | POA: Diagnosis present

## 2014-07-24 DIAGNOSIS — I129 Hypertensive chronic kidney disease with stage 1 through stage 4 chronic kidney disease, or unspecified chronic kidney disease: Secondary | ICD-10-CM | POA: Diagnosis present

## 2014-07-24 DIAGNOSIS — E43 Unspecified severe protein-calorie malnutrition: Secondary | ICD-10-CM | POA: Diagnosis present

## 2014-07-24 DIAGNOSIS — N183 Chronic kidney disease, stage 3 unspecified: Secondary | ICD-10-CM | POA: Diagnosis present

## 2014-07-24 DIAGNOSIS — R1310 Dysphagia, unspecified: Secondary | ICD-10-CM | POA: Diagnosis present

## 2014-07-24 DIAGNOSIS — I639 Cerebral infarction, unspecified: Secondary | ICD-10-CM | POA: Insufficient documentation

## 2014-07-24 DIAGNOSIS — Z515 Encounter for palliative care: Secondary | ICD-10-CM | POA: Diagnosis not present

## 2014-07-24 DIAGNOSIS — R05 Cough: Secondary | ICD-10-CM

## 2014-07-24 DIAGNOSIS — K5730 Diverticulosis of large intestine without perforation or abscess without bleeding: Secondary | ICD-10-CM | POA: Diagnosis present

## 2014-07-24 DIAGNOSIS — R490 Dysphonia: Secondary | ICD-10-CM

## 2014-07-24 DIAGNOSIS — Z931 Gastrostomy status: Secondary | ICD-10-CM

## 2014-07-24 DIAGNOSIS — M419 Scoliosis, unspecified: Secondary | ICD-10-CM | POA: Diagnosis present

## 2014-07-24 DIAGNOSIS — M1990 Unspecified osteoarthritis, unspecified site: Secondary | ICD-10-CM | POA: Diagnosis present

## 2014-07-24 DIAGNOSIS — Z888 Allergy status to other drugs, medicaments and biological substances status: Secondary | ICD-10-CM | POA: Diagnosis not present

## 2014-07-24 DIAGNOSIS — N281 Cyst of kidney, acquired: Secondary | ICD-10-CM | POA: Diagnosis present

## 2014-07-24 DIAGNOSIS — R630 Anorexia: Secondary | ICD-10-CM | POA: Diagnosis not present

## 2014-07-24 DIAGNOSIS — K766 Portal hypertension: Secondary | ICD-10-CM | POA: Diagnosis present

## 2014-07-24 DIAGNOSIS — T17908A Unspecified foreign body in respiratory tract, part unspecified causing other injury, initial encounter: Secondary | ICD-10-CM

## 2014-07-24 DIAGNOSIS — J984 Other disorders of lung: Secondary | ICD-10-CM

## 2014-07-24 DIAGNOSIS — Z96641 Presence of right artificial hip joint: Secondary | ICD-10-CM | POA: Diagnosis present

## 2014-07-24 DIAGNOSIS — J90 Pleural effusion, not elsewhere classified: Secondary | ICD-10-CM | POA: Diagnosis present

## 2014-07-24 DIAGNOSIS — R059 Cough, unspecified: Secondary | ICD-10-CM

## 2014-07-24 DIAGNOSIS — K862 Cyst of pancreas: Secondary | ICD-10-CM

## 2014-07-24 DIAGNOSIS — Z66 Do not resuscitate: Secondary | ICD-10-CM | POA: Diagnosis present

## 2014-07-24 DIAGNOSIS — K7689 Other specified diseases of liver: Secondary | ICD-10-CM | POA: Diagnosis present

## 2014-07-24 DIAGNOSIS — D649 Anemia, unspecified: Secondary | ICD-10-CM | POA: Diagnosis present

## 2014-07-24 LAB — MAGNESIUM: Magnesium: 2.4 mg/dL (ref 1.5–2.5)

## 2014-07-24 LAB — COMPREHENSIVE METABOLIC PANEL
ALK PHOS: 112 U/L (ref 39–117)
ALT: 15 U/L (ref 0–53)
AST: 29 U/L (ref 0–37)
Albumin: 3.5 g/dL (ref 3.5–5.2)
Anion gap: 16 — ABNORMAL HIGH (ref 5–15)
BUN: 42 mg/dL — ABNORMAL HIGH (ref 6–23)
CO2: 23 meq/L (ref 19–32)
Calcium: 9.7 mg/dL (ref 8.4–10.5)
Chloride: 110 mEq/L (ref 96–112)
Creatinine, Ser: 1.44 mg/dL — ABNORMAL HIGH (ref 0.50–1.35)
GFR calc non Af Amer: 42 mL/min — ABNORMAL LOW (ref >90)
GFR, EST AFRICAN AMERICAN: 49 mL/min — AB (ref >90)
Glucose, Bld: 100 mg/dL — ABNORMAL HIGH (ref 70–99)
POTASSIUM: 4.3 meq/L (ref 3.7–5.3)
Sodium: 149 mEq/L — ABNORMAL HIGH (ref 137–147)
TOTAL PROTEIN: 6.1 g/dL (ref 6.0–8.3)
Total Bilirubin: 1 mg/dL (ref 0.3–1.2)

## 2014-07-24 LAB — BODY FLUID CULTURE: Culture: NO GROWTH

## 2014-07-24 LAB — URINALYSIS, ROUTINE W REFLEX MICROSCOPIC
Glucose, UA: NEGATIVE mg/dL
HGB URINE DIPSTICK: NEGATIVE
KETONES UR: 40 mg/dL — AB
Leukocytes, UA: NEGATIVE
Nitrite: NEGATIVE
Protein, ur: NEGATIVE mg/dL
SPECIFIC GRAVITY, URINE: 1.017 (ref 1.005–1.030)
UROBILINOGEN UA: 0.2 mg/dL (ref 0.0–1.0)
pH: 5.5 (ref 5.0–8.0)

## 2014-07-24 LAB — LIPID PANEL
CHOL/HDL RATIO: 3.3 ratio
CHOLESTEROL: 169 mg/dL (ref 0–200)
HDL: 51 mg/dL (ref >39)
LDL Cholesterol: 98 mg/dL (ref 0–99)
Triglycerides: 102 mg/dL (ref <150)
VLDL: 20 mg/dL (ref 0–40)

## 2014-07-24 LAB — PHOSPHORUS: Phosphorus: 3.1 mg/dL (ref 2.3–4.6)

## 2014-07-24 LAB — HEMOGLOBIN A1C
Hgb A1c MFr Bld: 5.4 % (ref <5.7)
MEAN PLASMA GLUCOSE: 108 mg/dL (ref <117)

## 2014-07-24 MED ORDER — POLYETHYL GLYCOL-PROPYL GLYCOL 0.4-0.3 % OP GEL: 1.0000 "application " | OPHTHALMIC | Status: DC | PRN

## 2014-07-24 MED ORDER — NAPHAZOLINE HCL 0.1 % OP SOLN
1.0000 [drp] | Freq: Four times a day (QID) | OPHTHALMIC | Status: DC | PRN
  Filled 2014-07-24: qty 15

## 2014-07-24 MED ORDER — BISACODYL 10 MG RE SUPP: 10.0000 mg | Freq: Every day | RECTAL | Status: DC | PRN

## 2014-07-24 MED ORDER — WHITE PETROLATUM GEL
Status: AC
Start: 2014-07-24 — End: 2014-07-25
  Filled 2014-07-24: qty 5

## 2014-07-24 MED ORDER — ARTIFICIAL TEARS OP OINT
TOPICAL_OINTMENT | OPHTHALMIC | Status: DC | PRN
  Filled 2014-07-24: qty 3.5

## 2014-07-24 MED ORDER — ACETAMINOPHEN 325 MG PO TABS: 650.0000 mg | ORAL_TABLET | ORAL | Status: DC | PRN

## 2014-07-24 MED ORDER — SODIUM CHLORIDE 0.9 % IV SOLN
INTRAVENOUS | Status: AC
  Administered 2014-07-24 – 2014-07-25 (×2): via INTRAVENOUS

## 2014-07-24 MED ORDER — ACETAMINOPHEN 650 MG RE SUPP: 650.0000 mg | RECTAL | Status: DC | PRN

## 2014-07-24 NOTE — Consult Note (Signed)
Referring Physician: Elvera Lennox    Chief Complaint: dysphagia for one week with question of stroke.   HPI:                                                                                                                                         [name] is an 80 y.o. male who was noted to to have difficulty swallowing liquids on thanksgiving and the next day he was noted to have more difficulty with meats and swallowing.  For the past week he has had more difficulty with both pills, water and solids.  The daughter at bedside states his voice has also been "nasal" but seems to become worse the more he speaks. He denies any extremity weakness, diplopia, numbness or tinging.  Today he was noted to have difficulties swallowing medications and has been made NPO. SLP obtained a MBS and noted "moderate oral and gross pharyngeal, cervical esophageal dysphagia with nearly absent muscle contraction. Only a few boluses of liquids were provided to pt due to severity of dysphagia. Gross pharyngeal stasis with penetration/aspiration noted with both consistences."  Of note, daughter states he has had previous episodes of difficulty swallowing in the past but these were breif and resolved spontaneously.   Date last known well: Date: 07/15/2014 Time last known well: Unable to determine tPA Given: No: out of window  Past Medical History  Diagnosis Date  . Gout   . Insomnia   . BPH (benign prostatic hyperplasia)   . Kidney disorder   . Arthritis   . Hypertension     patient denies on 10/25/12 on no meds   . Complication of anesthesia     hard to wake up -2011 after hernia surgery   . Anemia   . CHF (congestive heart failure)   . Prostate disorder   . Hernia, abdominal     Past Surgical History  Procedure Laterality Date  . Cataract extraction    . Cholecystectomy    . Hernia repair  02/22/11    right  . Joint replacement      fractured hip  . Total hip arthroplasty Right 10/29/2012    Procedure:  CONVERSION OF PREVIOUS HIP SURGERY TO RIGHT TOTAL HIP AND REMOVAL OF IM NAIL ;  Surgeon: Shelda Pal, MD;  Location: [facility];  Service: Orthopedics;  Laterality: Right;  . Esophagogastroduodenoscopy N/A 11/16/2012    Procedure: ESOPHAGOGASTRODUODENOSCOPY (EGD);  Surgeon: Theda Belfast, MD;  Location: [facility];  Service: Endoscopy;  Laterality: N/A;  . Intramedullary (im) nail intertrochanteric Left 10/17/2013    Procedure: INTRAMEDULLARY (IM) NAIL INTERTROCHANTRIC;  Surgeon: Shelda Pal, MD;  Location: [facility];  Service: Orthopedics;  Laterality: Left;    Family History  Problem Relation Age of Onset  . Heart failure Mother     heart  . Heart failure Father     fell and hit head  .  Cancer Brother     colon  . Diabetes Daughter    Social History:  reports that he has never smoked. He has never used smokeless tobacco. He reports that he does not drink alcohol or use illicit drugs.  Allergies:  Allergies  Allergen Reactions  . Sertraline Nausea Only  . Nsaids Other (See Comments)    history of renal failure    Medications:                                                                                                                           Prior to Admission:  Prescriptions prior to admission  Medication Sig Dispense Refill Last Dose  . acetaminophen (TYLENOL) 500 MG tablet Take 1,000 mg by mouth every 6 (six) hours as needed for mild pain.   Taking  . allopurinol (ZYLOPRIM) 100 MG tablet Take 100 mg by mouth daily.   Taking  . lactose free nutrition (BOOST PLUS) LIQD Take 237 mLs by mouth 2 (two) times daily between meals. 237 mL 50 Taking  . naphazoline-glycerin (CLEAR EYES) 0.012-0.2 % SOLN Place 1-2 drops into both eyes every 4 (four) hours as needed for irritation.   Taking  . Polyethyl Glycol-Propyl Glycol (SYSTANE) 0.4-0.3 % GEL Apply 1 application to eye as needed (dry/watery eyes).   Taking  . silodosin (RAPAFLO) 8 MG CAPS capsule Take 8 mg by mouth daily with  breakfast.   Taking   Scheduled:  Continuous: . sodium chloride 50 mL/hr at 07/24/14 1513    ROS:                                                                                                                                       History obtained from the patient  General ROS: negative for - chills, fatigue, fever, night sweats, weight gain or weight loss Psychological ROS: negative for - behavioral disorder, hallucinations, memory difficulties, mood swings or suicidal ideation Ophthalmic ROS: negative for - blurry vision, double vision, eye pain or loss of vision ENT ROS: negative for - epistaxis, nasal discharge, oral lesions, sore throat, tinnitus or vertigo Allergy and Immunology ROS: negative for - hives or itchy/watery eyes Hematological and Lymphatic ROS: negative for - bleeding problems, bruising or swollen lymph nodes Endocrine ROS: negative for - galactorrhea, hair pattern changes, polydipsia/polyuria or temperature intolerance Respiratory ROS: negative for - cough, hemoptysis, shortness of  breath or wheezing Cardiovascular ROS: negative for - chest pain, dyspnea on exertion, edema or irregular heartbeat Gastrointestinal ROS: negative for - abdominal pain, diarrhea, hematemesis, nausea/vomiting or stool incontinence Genito-Urinary ROS: negative for - dysuria, hematuria, incontinence or urinary frequency/urgency Musculoskeletal ROS: negative for - joint swelling or muscular weakness Neurological ROS: as noted in HPI Dermatological ROS: negative for rash and skin lesion changes  Neurologic Examination:                                                                                                      Blood pressure 117/61, pulse 88, temperature 97.5 F (36.4 C), temperature source Oral, resp. rate 16, SpO2 100 %.   Physical Exam  Constitutional: He appears well-developed and well-nourished.  Psych: Affect appropriate to situation Eyes: No scleral injection HENT: No OP  obstrucion Head: Normocephalic.  Cardiovascular: Normal rate and regular rhythm.  Respiratory: Effort normal and breath sounds normal.  GI: Soft. Bowel sounds are normal. No distension. There is no tenderness.  Skin: WDI  Neuro Exam: Mental Status: Alert, oriented, thought content appropriate.  Speech fluent without evidence of aphasia but had bulbar quality voice.   Able to follow 3 step commands without difficulty. Cranial Nerves: II: Discs flat bilaterally; Visual fields grossly normal, pupils right was oval--S/P surgery and left was round.  reactive to light and accommodation III,IV, VI: ptosis present right eye, extra-ocular motions intact bilaterally V,VII: smile symmetric, facial light touch sensation normal bilaterally VIII: hearing normal bilaterally IX,X: gag reflex present XI: bilateral shoulder shrug XII: midline tongue extension without atrophy or fasciculations  Motor: Right : Upper extremity   5/5    Left:     Upper extremity   5/5  Lower extremity   5/5     Lower extremity   5/5 Tone and bulk:normal tone throughout; no atrophy noted Sensory: Pinprick and light touch intact throughout, bilaterally Deep Tendon Reflexes:  Right: Upper Extremity   Left: Upper extremity   biceps (C-5 to C-6) 2/4   biceps (C-5 to C-6) 2/4 tricep (C7) 2/4    triceps (C7) 2/4 Brachioradialis (C6) 2/4  Brachioradialis (C6) 2/4  Lower Extremity Lower Extremity  quadriceps (L-2 to L-4) 0/4   quadriceps (L-2 to L-4) 0/4 Achilles (S1) 0/4   Achilles (S1) 0/4  Plantars: Right: downgoing   Left: downgoing Cerebellar: normal finger-to-nose,  normal heel-to-shin test Gait: not tested due to multiple leads.  CV: pulses palpable throughout    Lab Results: Basic Metabolic Panel:  Recent Labs Lab 07/20/14 1130 07/24/14 1500  NA 140 149*  K 4.4 4.3  CL 103 110  CO2 24 23  GLUCOSE 87 100*  BUN 20 42*  CREATININE 1.10 1.44*  CALCIUM 9.5 9.7    Liver Function Tests:  Recent  Labs Lab 07/20/14 1130 07/24/14 1500  AST 23 29  ALT 12 15  ALKPHOS 131* 112  BILITOT 1.4* 1.0  PROT 6.2 6.1  ALBUMIN 3.3* 3.5   No results for input(s): LIPASE, AMYLASE in the last 168 hours. No results for input(s): AMMONIA in the last  168 hours.  CBC:  Recent Labs Lab 07/20/14 1130  WBC 5.4  NEUTROABS 4.2  HGB 13.7  HCT 40.5  MCV 93.5  PLT 128*    Cardiac Enzymes: No results for input(s): CKTOTAL, CKMB, CKMBINDEX, TROPONINI in the last 168 hours.  Lipid Panel:  Recent Labs Lab 07/24/14 1500  CHOL 169  TRIG 102  HDL 51  CHOLHDL 3.3  VLDL 20  LDLCALC 98    CBG: No results for input(s): GLUCAP in the last 168 hours.  Microbiology: Results for orders placed or performed during the hospital encounter of 07/21/14  Body fluid culture     Status: None   Collection Time: 07/21/14  9:56 AM  Result Value Ref Range Status   Specimen Description PLEURAL RIGHT  Final   Special Requests NONE  Final   Gram Stain   Final    RARE WBC PRESENT,BOTH PMN AND MONONUCLEAR NO ORGANISMS SEEN Performed at [facility]    Culture   Final    NO GROWTH 3 DAYS Performed at [facility]    Report Status 07/24/2014 FINAL  Final    Coagulation Studies: No results for input(s): LABPROT, INR in the last 72 hours.  Imaging: Dg Op Swallowing Func-medicare/speech Path  07/23/2014   CLINICAL DATA:  Chronic, recurrent right pleural effusion. Confusion. Dysphagia.  EXAM: MODIFIED BARIUM SWALLOW  TECHNIQUE: Different consistencies of barium were administered orally to the patient by the Speech Pathologist. Imaging of the pharynx was performed in the lateral projection.  FLUOROSCOPY TIME:  1 min, 29 seconds.  COMPARISON:  None.  FINDINGS: Thin liquid- administered on a spoon with chin-tuck and with the head turned to the left. There was silent aspiration with both swallows and retention of barium.  Nectar thick liquid- administered with a spoon. There was premature spill  and silent aspiration. Retention of barium is identified.  Honey- deferred.  Puree- deferred.  Cracker-deferred.  Puree with cracker- deferred.  Barium tablet -  deferred.  IMPRESSION: As above.  Please refer to the Speech Pathologists report for complete details and recommendations.  Insert swallowing function Please select appropriate template.   Electronically Signed   By: Drusilla Kannerhomas  Dalessio M.D.   On: 07/23/2014 14:25       Assessment and plan discussed with with attending physician and they are in agreement.    Felicie MornDavid Smith PA-C [facility] [phone]  07/24/2014, 4:32 PM   Assessment: 80 y.o. male with one week of dysphagia.  Concern is for possible brain stem CVA or possible myasthenia gravis. Stroke ordered set has been ordered.  Will also add myasthenia panel.    Stroke Risk Factors - hypertension  Recommend: 1) stroke panel 2) Myasthenia panel 3) stroke team to follow in AM  Patient seen and evaluated. Agree with above assessment and plan. Agree with stroke workup for his symptoms. With history of fluctuating symptoms, more pronounced with infection and in the evening would have concern over possible myasthenia gravis. Will add myasthenia panel in addition to stroke workup.   Elspeth Choeter Cherolyn Behrle, DO [facility] [phone]  If 7pm- 7am, please page neurology on call as listed in AMION.

## 2014-07-24 NOTE — Telephone Encounter (Signed)
Spoke with daughter pt is weak and unable to eat or drink  Will need admit to sort out  Hospital NP working on direct admit for medical team  Pt daughter , [name] aware

## 2014-07-24 NOTE — H&P (Signed)
[facility] History and Physical                                                                                    Patient Demographics [name], is a 80 y.o. male  MRN: [id number]   DOB - Jun 12, 1925 Admit Date - 07/24/2014 Outpatient Primary MD for the patient is Sharon SellerEUBANKS, JESSICA K, NP   HPI [name]  is a 80 y.o. male, with a pmh of recurrent exudative effusion (which will not be worked up per patient and patient's family), CHF, hip replacement x 2 and BPH.  He was sent as a direct admit from [facility] for possible PEG tube placement. [name] is quite coherent, but has a supportive family and his daughter gives much of the history.  [name] developed dysphonia "fat tongued speech" on thanksgiving when he choked on iced tea.  His symptoms worsened again on Saturday 11/28 when he again choked on liquids.  At this point he is unable to take medications, eat solids or liquids without choking.  Per daughter, this has happened to [name] several times in the past - when he has developed a UTI or when his pleural effusion has become large and needs to be drained.  Each time the dysphonia and dysphagia improved when the patient was treated for the underlying problem.  [name] underwent Speech Eval outpatient 12/2.  Results showed a high risk for aspiration with moderate oral phase dysphagia; severe pharyngeal phase dysphagia; severe cervical esophageal phase dysphagia. Given current neurologic concerns, the patient is being admitted under stroke protocol.   Review of Systems    In addition to the HPI above,  No Fever-chills, No Headache, No changes with Vision or hearing, No Chest pain, Cough or Shortness of Breath, No Abdominal pain, No Nausea or Vomiting, Bowel movements are regular, No Blood in stool or Urine, No dysuria,(Always difficulty with urination) No new skin rashes or bruises, No new joints pains-aches, (chronic gout) No new  weakness, tingling, numbness in any extremity, No recent weight gain or loss, (40 lb weight loss in last 2 years). No polyuria, polydypsia or polyphagia,   A full 10 point Review of Systems was done, except as stated above, all other Review of Systems were negative.   Past Medical History  Diagnosis Date  . Gout   . Insomnia   . BPH (benign prostatic hyperplasia)   . Kidney disorder   . Arthritis   . Hypertension     patient denies on 10/25/12 on no meds   . Complication of anesthesia     hard to wake up -2011 after hernia surgery   . Anemia   . CHF (congestive heart failure)   . Prostate disorder   . Hernia, abdominal       Past Surgical History  Procedure Laterality Date  . Cataract extraction    . Cholecystectomy    . Hernia repair  02/22/11    right  . Joint replacement      fractured hip  . Total hip arthroplasty Right 10/29/2012    Procedure: CONVERSION OF PREVIOUS HIP SURGERY TO RIGHT TOTAL HIP AND REMOVAL OF  IM NAIL ;  Surgeon: Shelda Pal, MD;  Location: [facility];  Service: Orthopedics;  Laterality: Right;  . Esophagogastroduodenoscopy N/A 11/16/2012    Procedure: ESOPHAGOGASTRODUODENOSCOPY (EGD);  Surgeon: Theda Belfast, MD;  Location: [facility];  Service: Endoscopy;  Laterality: N/A;  . Intramedullary (im) nail intertrochanteric Left 10/17/2013    Procedure: INTRAMEDULLARY (IM) NAIL INTERTROCHANTRIC;  Surgeon: Shelda Pal, MD;  Location: [facility];  Service: Orthopedics;  Laterality: Left;    Social History History  Substance Use Topics  . Smoking status: Never Smoker   . Smokeless tobacco: Never Used  . Alcohol Use: No     Family History Family History  Problem Relation Age of Onset  . Heart failure Mother     heart  . Heart failure Father     fell and hit head  . Cancer Brother     colon  . Diabetes Daughter      Prior to Admission medications   Medication Sig Start Date End Date Taking? Authorizing Provider  acetaminophen (TYLENOL) 500 MG  tablet Take 1,000 mg by mouth every 6 (six) hours as needed for mild pain.    Historical Provider, MD  allopurinol (ZYLOPRIM) 100 MG tablet Take 100 mg by mouth daily.    Historical Provider, MD  lactose free nutrition (BOOST PLUS) LIQD Take 237 mLs by mouth 2 (two) times daily between meals. 05/22/14   Catarina Hartshorn, MD  naphazoline-glycerin (CLEAR EYES) 0.012-0.2 % SOLN Place 1-2 drops into both eyes every 4 (four) hours as needed for irritation.    Historical Provider, MD  Polyethyl Glycol-Propyl Glycol (SYSTANE) 0.4-0.3 % GEL Apply 1 application to eye as needed (dry/watery eyes).    Historical Provider, MD  silodosin (RAPAFLO) 8 MG CAPS capsule Take 8 mg by mouth daily with breakfast.    Historical Provider, MD    Allergies  Allergen Reactions  . Sertraline Nausea Only  . Nsaids Other (See Comments)    history of renal failure    Physical Exam  Vitals  Blood pressure 117/61, pulse 88, temperature 97.5 F (36.4 C), temperature source Oral, resp. rate 16, SpO2 100 %.  General:  Elderly frail gentleman, sitting in chair, surrounded by family.  Bandaid over right eye.  Psych:  Normal affect and insight, Not Suicidal or Homicidal, Awake Alert, Oriented X 3. Neuro:  Slurred speech, Ax0x4, CN intact. Strength 5/5 all 4 extremities, Sensation intact all 4 extremities. ENT:  Eyes have clear drainage bilaterally with sagging conjunctiva.  Pupils are pin point. Neck:  Supple Neck, No JVD, No cervical lymphadenopathy appreciated Respiratory:  Symmetrical Chest wall movement, Good air movement bilaterally, CTAB. Cardiac:  RRR, No Gallops, Rubs or Murmurs, No Parasternal Heave. Abdomen:  Thin, Positive Bowel Sounds, Abdomen Soft, Non tender, No organomegaly appreciated Skin:  No Cyanosis, Normal Skin Turgor, No Skin Rash.  Multiple bruises on arm. Extremities:  Good muscle tone,  joints appear normal , no effusions, Normal ROM.   Data Review  CBC  Recent Labs Lab 07/20/14 1130  WBC 5.4   HGB 13.7  HCT 40.5  PLT 128*  MCV 93.5  MCH 31.6  MCHC 33.8  RDW 14.1  LYMPHSABS 0.6*  MONOABS 0.5  EOSABS 0.0  BASOSABS 0.0   ------------------------------------------------------------------------------------------------------------------  Chemistries   Recent Labs Lab 07/20/14 1130  NA 140  K 4.4  CL 103  CO2 24  GLUCOSE 87  BUN 20  CREATININE 1.10  CALCIUM 9.5  AST 23  ALT 12  ALKPHOS  131*  BILITOT 1.4*      ---------------------------------------------------------------------------------------------------------------  Urinalysis    Component Value Date/Time   COLORURINE YELLOW 07/20/2014 1125   APPEARANCEUR CLEAR 07/20/2014 1125   LABSPEC 1.012 07/20/2014 1125   PHURINE 6.0 07/20/2014 1125   GLUCOSEU NEGATIVE 07/20/2014 1125   HGBUR NEGATIVE 07/20/2014 1125   BILIRUBINUR NEGATIVE 07/20/2014 1125   BILIRUBINUR 1 02/18/2014 1514   KETONESUR 15* 07/20/2014 1125   PROTEINUR NEGATIVE 07/20/2014 1125   PROTEINUR trace 02/18/2014 1514   UROBILINOGEN 1.0 07/20/2014 1125   UROBILINOGEN 1.0 02/18/2014 1514   NITRITE NEGATIVE 07/20/2014 1125   NITRITE negative 02/18/2014 1514   LEUKOCYTESUR NEGATIVE 07/20/2014 1125    ----------------------------------------------------------------------------------------------------------------  Imaging results:   Dg Chest 1 View  07/21/2014   CLINICAL DATA:  Post RIGHT thoracentesis  EXAM: CHEST - 1 VIEW  COMPARISON:  07/20/2014  FINDINGS: Decrease in RIGHT pleural effusion post thoracentesis.  No pneumothorax.  Persistent RIGHT basilar atelectasis.  LEFT lung clear.  Bones demineralized.  IMPRESSION: Decreased RIGHT pleural effusion and basilar atelectasis post thoracentesis without pneumothorax.   Electronically Signed   By: Ulyses Southward M.D.   On: 07/21/2014 10:24   Dg Chest 2 View  07/20/2014   CLINICAL DATA:  Difficulty swallowing with thick mucus in the throat. Cough.  EXAM: CHEST  2 VIEW  COMPARISON:   05/21/2014 and 05/20/2014  FINDINGS: The large right pleural effusion has enlarged in the interim. There is marked volume loss related to the large right pleural effusion. Left lung is clear. Heart size is normal. The trachea is midline. Surgical clips in the right upper abdomen. Again noted are compression fractures in the upper lumbar spine.  IMPRESSION: Enlargement of the right pleural effusion. Pleural effusion is large for size.   Electronically Signed   By: Richarda Overlie M.D.   On: 07/20/2014 12:00   Dg Op Swallowing Func-medicare/speech Path  07/23/2014   CLINICAL DATA:  Chronic, recurrent right pleural effusion. Confusion. Dysphagia.  EXAM: MODIFIED BARIUM SWALLOW  TECHNIQUE: Different consistencies of barium were administered orally to the patient by the Speech Pathologist. Imaging of the pharynx was performed in the lateral projection.  FLUOROSCOPY TIME:  1 min, 29 seconds.  COMPARISON:  None.  FINDINGS: Thin liquid- administered on a spoon with chin-tuck and with the head turned to the left. There was silent aspiration with both swallows and retention of barium.  Nectar thick liquid- administered with a spoon. There was premature spill and silent aspiration. Retention of barium is identified.  Honey- deferred.  Puree- deferred.  Cracker-deferred.  Puree with cracker- deferred.  Barium tablet -  deferred.  IMPRESSION: As above.  Please refer to the Speech Pathologists report for complete details and recommendations.  Insert swallowing function Please select appropriate template.   Electronically Signed   By: Drusilla Kanner M.D.   On: 07/23/2014 14:25   US Thoracentesis Asp Pleural Space W/img Guide  07/21/2014   INDICATION: Symptomatic right sided pleural effusion  EXAM: US THORACENTESIS ASP PLEURAL SPACE W/IMG GUIDE  COMPARISON:  Previous thoracentesis  MEDICATIONS: 10 cc 1% lidocaine  COMPLICATIONS: None immediate  TECHNIQUE: Informed written consent was obtained from the patient after a discussion  of the risks, benefits and alternatives to treatment. A timeout was performed prior to the initiation of the procedure.  Initial ultrasound scanning demonstrates a right pleural effusion. The lower chest was prepped and draped in the usual sterile fashion. 1% lidocaine was used for local anesthesia.  Under direct ultrasound guidance,  a 19 gauge, 7-cm, Yueh catheter was introduced. An ultrasound image was saved for documentation purposes. the thoracentesis was performed. The catheter was removed and a dressing was applied. The patient tolerated the procedure well without immediate post procedural complication. The patient was escorted to have an upright chest radiograph.  FINDINGS: A total of approximately 1.5 liters of yellow fluid was removed. Requested samples were sent to the laboratory.  IMPRESSION: Successful ultrasound-guided right sided thoracentesis yielding 1.5 liters of pleural fluid.  Read by::  Robet LeuPamela A Turpin [facility]   Electronically Signed   By: Oley Balmaniel  Hassell M.D.   On: 07/21/2014 11:47   My personal review of EKG: pending.  Assessment & Plan  Principal Problem:   Dysphagia Active Problems:   Diastolic CHF, acute   Constipation   CKD (chronic kidney disease) stage 3, GFR 30-59 ml/min   BPH (benign prostatic hyperplasia)   Dysphonia   CVA (cerebral infarction)   Severe malnutrition    Dysphagia/dysarthria in the setting of ?TIAs/CVA - this is intermittent and more pronounced in the past few days without a clear etiology. In the past this was associated with infections however there is currently no clinical evidence. Concern for TIAs/CVA, will consult neuro and also appreciate their input into dysphagia.  - no other neurologic deficits - r/o infection, obtain UA - basic blood work pending since a direct admit  Goals of care - I have extensively discussed with the family about a feeding tube as they have multiple questions. Patient does not wish to want one but to some extent he  needs to consult further with his family. Patient is concerned about his independence if he is to have a feeding tube, however is also concerned about his inability to eat. - he is functional for his age and his mental status is excellent. If neuro workup if unrevealing for a reversible cause it is reasonable to have a PEG tube placed  FTT - clinically appears dehydrated, start normal saline at 50 cc/h - obtain basic labs  - will hold off on ordering a PEG tube for now pending neuro evaluation, MRI.   CKD stage 3 - at baseline 4 days ago, repeat    BPH - hold oral home medications   Severe malnutrition - nutrition consult  Chronic diastolic heart failure - careful with fluids, appears euvolemic. Last 2 D echo 10/1, will not repeat.  Ectropion on right - outpatient ophthalmology    DVT Prophylaxis:  SCDs.  Will reconsider prophylaxis after labs and CT head are completed. Family Communication:   Wife & daughters at beside. Code Status:  DNR  Time spent in minutes : 70    York, Tora KindredMarianne L PA-C on 07/24/2014 at 2:51 PM Between 7am to 4pm - Pager - [phone] After 7pm go to [email or web address] - password [email or web address] And look for the night coverage person covering me after hours  [facility] Office  [phone]   Patient seen and examined, chart and data base reviewed.  I agree with the above assessment and plan and have edited the above note  For full details please see Mrs. Algis DownsMarianne York PA note.  Pamella Pertostin Bertrice Leder, MD [facility] [phone]

## 2014-07-24 NOTE — Telephone Encounter (Signed)
Agree that he needs a feeding tube ASAP while we wait on neurology input to decied if this is reversible process or not. We can admit him electively to the hospital & get this placed by IR. Alternatively, he can go as outpt to GI or IR -but may take a few days. Tammy, can you FU with this patient please to arrange depending on what he & daughter decide?

## 2014-07-25 ENCOUNTER — Telehealth: Payer: Self-pay | Admitting: Neurology

## 2014-07-25 ENCOUNTER — Inpatient Hospital Stay (HOSPITAL_COMMUNITY): Payer: Medicare Other

## 2014-07-25 ENCOUNTER — Encounter (HOSPITAL_COMMUNITY): Payer: Self-pay | Admitting: *Deleted

## 2014-07-25 ENCOUNTER — Ambulatory Visit (HOSPITAL_COMMUNITY): Payer: Medicare Other

## 2014-07-25 DIAGNOSIS — R630 Anorexia: Secondary | ICD-10-CM

## 2014-07-25 DIAGNOSIS — Z515 Encounter for palliative care: Secondary | ICD-10-CM

## 2014-07-25 DIAGNOSIS — R531 Weakness: Secondary | ICD-10-CM

## 2014-07-25 LAB — COMPREHENSIVE METABOLIC PANEL
ALBUMIN: 3.2 g/dL — AB (ref 3.5–5.2)
ALK PHOS: 101 U/L (ref 39–117)
ALT: 14 U/L (ref 0–53)
AST: 27 U/L (ref 0–37)
Anion gap: 19 — ABNORMAL HIGH (ref 5–15)
BILIRUBIN TOTAL: 0.9 mg/dL (ref 0.3–1.2)
BUN: 38 mg/dL — ABNORMAL HIGH (ref 6–23)
CO2: 20 mEq/L (ref 19–32)
Calcium: 9.3 mg/dL (ref 8.4–10.5)
Chloride: 115 mEq/L — ABNORMAL HIGH (ref 96–112)
Creatinine, Ser: 1.35 mg/dL (ref 0.50–1.35)
GFR calc Af Amer: 53 mL/min — ABNORMAL LOW (ref >90)
GFR calc non Af Amer: 46 mL/min — ABNORMAL LOW (ref >90)
Glucose, Bld: 71 mg/dL (ref 70–99)
POTASSIUM: 4.3 meq/L (ref 3.7–5.3)
SODIUM: 154 meq/L — AB (ref 137–147)
TOTAL PROTEIN: 5.7 g/dL — AB (ref 6.0–8.3)

## 2014-07-25 LAB — CBC
HCT: 40.6 % (ref 39.0–52.0)
Hemoglobin: 13 g/dL (ref 13.0–17.0)
MCH: 30.5 pg (ref 26.0–34.0)
MCHC: 32 g/dL (ref 30.0–36.0)
MCV: 95.3 fL (ref 78.0–100.0)
PLATELETS: 152 10*3/uL (ref 150–400)
RBC: 4.26 MIL/uL (ref 4.22–5.81)
RDW: 14.8 % (ref 11.5–15.5)
WBC: 5.9 10*3/uL (ref 4.0–10.5)

## 2014-07-25 LAB — PHOSPHORUS: Phosphorus: 3.4 mg/dL (ref 2.3–4.6)

## 2014-07-25 MED ORDER — CETYLPYRIDINIUM CHLORIDE 0.05 % MT LIQD
7.0000 mL | Freq: Two times a day (BID) | OROMUCOSAL | Status: DC
  Administered 2014-07-25 – 2014-07-31 (×10): 7 mL via OROMUCOSAL

## 2014-07-25 MED ORDER — IOHEXOL 300 MG/ML  SOLN
80.0000 mL | Freq: Once | INTRAMUSCULAR | Status: AC | PRN
  Administered 2014-07-25: 80 mL via INTRAVENOUS

## 2014-07-25 MED ORDER — CHLORHEXIDINE GLUCONATE 0.12 % MT SOLN
15.0000 mL | Freq: Two times a day (BID) | OROMUCOSAL | Status: DC
  Administered 2014-07-25 – 2014-08-01 (×13): 15 mL via OROMUCOSAL
  Filled 2014-07-25 (×7): qty 15

## 2014-07-25 NOTE — Consult Note (Signed)
Patient [name]      DOB: September 12, 1924      WLN:[id number]     Consult Note from the Palliative Medicine Team at [facility] Requested by: Imogene Burn, PA     PCP: Lauree Chandler, NP Reason for Consultation: GOC, feeding tube     Phone Number:[phone]  Assessment of patients Current state: I met today with Mr. And Mrs. Thiam, daughters [place] and [place], and son-in-law at bedside. They express appreciation with any assistance in being faced with this decision of a feeding tube. None of them feel like they have enough answers at this point to decide if they want to proceed with PEG or not. We did discuss the general benefits vs risks of feeding tubes. They most certainly are very concerned with the potential risks involved but they also understand that if this dysphagia continues prognosis is very poor without PEG. They are looking forward to further discussions, and hopefully explanations, with neurology. They tell me that he has no other symptoms but begins with changes in his speech and then coughing with drinking/eating, as this has happened one other occasion but he was able to eat soft/chopped foods and thickened liquids before and this was transient. He otherwise endorses good quality of life and fairly active and independent with only this recurrent pleural effusion since a fractured hip in Feb 2015. He had no other reported symptoms with the dysphonia and dysphagia. Denies change in mental status, confusion, weakness, facial droop along with the dysphonia. They do say that he has been eating less and has lost weight recently. They would like for [place] to continue the conversation as they hopefully learn more about what may be causing his dysphagia. I will follow up on Monday, please call #[phone] for acute palliative needs over the weekend.    Goals of Care: 1.  Code Status: DNR   2. Scope of Treatment: Continue available and offered medical interventions.  Considering the necessity and desire of PEG.    4. Disposition: Home when stable.    3. Symptom Management:   1. Dysphagia - moderate oral phase, severe pharyngeal phase, severe cervical esophageal phase per SLP: Continue work up and support with IVF. Consider PEG.  2. Constipation: Dulcolax supp daily prn.  3. Decreased appetite/weight loss: Continue medical work up. Considering tube feeding. Recommend nutritional supplements if swallow function improves.   4. Psychosocial: Emotional support provided to patient and family at bedside.    Brief HPI: 80 yo male admitted with dysphonia and dysphagia of an unknown etiology. Work up involves r/o stroke, myasthenia gravis, paraneoplastic etiology, vs mechanical GI causes. PMH significant for CHF, anemia, gout, insomnia, BPH, kidney disorder, arthritis, HTN, abd hernia.    ROS: + difficulty swallowing. Denies pain, anxiety.     PMH:  Past Medical History  Diagnosis Date  . Gout   . Insomnia   . BPH (benign prostatic hyperplasia)   . Kidney disorder   . Arthritis   . Hypertension     patient denies on 10/25/12 on no meds   . Complication of anesthesia     hard to wake up -2011 after hernia surgery   . Anemia   . CHF (congestive heart failure)   . Prostate disorder   . Hernia, abdominal      PSH: Past Surgical History  Procedure Laterality Date  . Cataract extraction    . Cholecystectomy    . Hernia repair  02/22/11  right  . Joint replacement      fractured hip  . Total hip arthroplasty Right 10/29/2012    Procedure: CONVERSION OF PREVIOUS HIP SURGERY TO RIGHT TOTAL HIP AND REMOVAL OF IM NAIL ;  Surgeon: Mauri Pole, MD;  Location: [facility];  Service: Orthopedics;  Laterality: Right;  . Esophagogastroduodenoscopy N/A 11/16/2012    Procedure: ESOPHAGOGASTRODUODENOSCOPY (EGD);  Surgeon: Beryle Beams, MD;  Location: [facility];  Service: Endoscopy;  Laterality: N/A;  . Intramedullary (im) nail intertrochanteric Left  10/17/2013    Procedure: INTRAMEDULLARY (IM) NAIL INTERTROCHANTRIC;  Surgeon: Mauri Pole, MD;  Location: [facility];  Service: Orthopedics;  Laterality: Left;   I have reviewed the [place] and SH and  If appropriate update it with new information. Allergies  Allergen Reactions  . Sertraline Nausea Only  . Nsaids Other (See Comments)    history of renal failure   Scheduled Meds: . antiseptic oral rinse  7 mL Mouth Rinse q12n4p  . chlorhexidine  15 mL Mouth Rinse BID   Continuous Infusions: . sodium chloride 50 mL/hr at 07/24/14 1513   PRN Meds:.acetaminophen **OR** acetaminophen, artificial tears, bisacodyl, naphazoline    BP 132/57 mmHg  Pulse 75  Temp(Src) 97.7 F (36.5 C) (Oral)  Resp 16  Ht $R'5\' 3"'Fx$  (1.6 m)  Wt 51.256 kg (113 lb)  BMI 20.02 kg/m2  SpO2 99%   PPS: 30%   Intake/Output Summary (Last 24 hours) at 07/25/14 1221 Last data filed at 07/25/14 1028  Gross per 24 hour  Intake    450 ml  Output    475 ml  Net    -25 ml    Physical Exam:  General: NAD, awake, alert HEENT: [place]/AT, no JVD, moist mucous membranes Chest: No labored breathing, symmetric CVS: RRR Abdomen: Soft, NT, ND Ext: MAE, no edema, warm to touch Neuro: Awake, alert, oriented x 3  Labs: CBC    Component Value Date/Time   WBC 5.9 07/25/2014 0453   RBC 4.26 07/25/2014 0453   HGB 13.0 07/25/2014 0453   HCT 40.6 07/25/2014 0453   PLT 152 07/25/2014 0453   MCV 95.3 07/25/2014 0453   MCH 30.5 07/25/2014 0453   MCHC 32.0 07/25/2014 0453   RDW 14.8 07/25/2014 0453   LYMPHSABS 0.6* 07/20/2014 1130   MONOABS 0.5 07/20/2014 1130   EOSABS 0.0 07/20/2014 1130   BASOSABS 0.0 07/20/2014 1130    BMET    Component Value Date/Time   NA 154* 07/25/2014 0453   NA 140 06/06/2014 0944   K 4.3 07/25/2014 0453   CL 115* 07/25/2014 0453   CO2 20 07/25/2014 0453   GLUCOSE 71 07/25/2014 0453   GLUCOSE 82 06/06/2014 0944   GLUCOSE 84 08/28/2006 1429   BUN 38* 07/25/2014 0453   BUN 29* 06/06/2014  0944   CREATININE 1.35 07/25/2014 0453   CALCIUM 9.3 07/25/2014 0453   GFRNONAA 46* 07/25/2014 0453   GFRAA 53* 07/25/2014 0453    CMP     Component Value Date/Time   NA 154* 07/25/2014 0453   NA 140 06/06/2014 0944   K 4.3 07/25/2014 0453   CL 115* 07/25/2014 0453   CO2 20 07/25/2014 0453   GLUCOSE 71 07/25/2014 0453   GLUCOSE 82 06/06/2014 0944   GLUCOSE 84 08/28/2006 1429   BUN 38* 07/25/2014 0453   BUN 29* 06/06/2014 0944   CREATININE 1.35 07/25/2014 0453   CALCIUM 9.3 07/25/2014 0453   PROT 5.7* 07/25/2014 0453   PROT 6.0 06/06/2014  0944   ALBUMIN 3.2* 07/25/2014 0453   AST 27 07/25/2014 0453   ALT 14 07/25/2014 0453   ALKPHOS 101 07/25/2014 0453   BILITOT 0.9 07/25/2014 0453   GFRNONAA 46* 07/25/2014 0453   GFRAA 53* 07/25/2014 0453     Time In Time Out Total Time Spent with Patient Total Overall Time  1100 1210 5min 65min    Greater than 50%  of this time was spent counseling and coordinating care related to the above assessment and plan.  Vinie Sill, NP Palliative Medicine Team Pager # [phone] (M-F 8a-5p) Team Phone # [phone] (Nights/Weekends)

## 2014-07-25 NOTE — Progress Notes (Signed)
*  PRELIMINARY RESULTS* Vascular Ultrasound Carotid Duplex (Doppler) has been completed.  Preliminary findings: Bilateral:  1-39% ICA stenosis.  Vertebral artery flow is antegrade.     Farrel DemarkJill Eunice, RDMS, RVT  07/25/2014, 12:28 PM

## 2014-07-25 NOTE — Telephone Encounter (Signed)
Pt's daughter called and cancelled NP appt w/ Dr. Everlena CooperJaffe. Pt is currently an inpt in the hospital. Referring office notified via referral note in EPIC / Sherri S.

## 2014-07-25 NOTE — Progress Notes (Signed)
Full note to follow:  I met today with Mr. And Mrs. Gruel, daughters [place] and [place], and son-in-law at bedside. They express appreciation with any assistance in being faced with this decision of a feeding tube. None of them feel like they have enough answers at this point to decide if they want to proceed with PEG or not. We did discuss the general benefits vs risks of feeding tubes. They most certainly are very concerned with the potential risks involved but they also understand that if this dysphagia continues prognosis is very poor without PEG. They are looking forward to further discussions, and hopefully explanations, with neurology. They tell me that he has no other symptoms but begins with changes in his speech and then coughing with drinking/eating, as this has happened one other time but he was able to eat soft/chopped foods and thick-it before and this was transient. He otherwise endorses good quality of life and fairly active and independent with only this recurrent pleural effusion since a fractured hip in Feb 2015. He had no other reported symptoms with the dysphonia and dysphagia. Denies change in mental status, confusion, weakness, facial droop along with the dysphonia. They do say that he has been eating less and has lost weight recently. They would like for [place] to continue the conversation as they hopefully learn more about what may be causing his dysphagia. I will follow up on Monday, please call #[phone] for acute palliative needs over the weekend.   Vinie Sill, NP Palliative Medicine Team Pager # [phone] (M-F 8a-5p) Team Phone # [phone] (Nights/Weekends)

## 2014-07-25 NOTE — Progress Notes (Deleted)
STROKE TEAM PROGRESS NOTE   HISTORY [name] is an 80 y.o. male who was noted to to have difficulty swallowing liquids on thanksgiving 07/15/2014 (time unknown) and the next day he was noted to have more difficulty with meats and swallowing. For the past week he has had more difficulty with both pills, water and solids. The daughter at bedside states his voice has also been "nasal" but seems to become worse the more he speaks. He denies any extremity weakness, diplopia, numbness or tinging. Today he was noted to have difficulties swallowing medications and has been made NPO. SLP obtained a MBS and noted "moderate oral and gross pharyngeal, cervical esophageal dysphagia with nearly absent muscle contraction. Only a few boluses of liquids were provided to pt due to severity of dysphagia. Gross pharyngeal stasis with penetration/aspiration noted with both consistences."  Of note, daughter states he has had previous episodes of difficulty swallowing in the past but these were breif and resolved spontaneously.   Patient was not administered TPA secondary to delay in arrival. He was admitted for further evaluation and treatment.   SUBJECTIVE (INTERVAL HISTORY) Multiple family members present. Pt still having difficulty speaking and swallowing. Apparently this has been a re occuring problem related to stress and illness.   OBJECTIVE Temp:  [97.5 F (36.4 C)-98.1 F (36.7 C)] 97.7 F (36.5 C) (12/04 0600) Pulse Rate:  [75-88] 75 (12/04 0600) Cardiac Rhythm:  [-]  Resp:  [16] 16 (12/04 0600) BP: (117-132)/(57-64) 132/57 mmHg (12/04 0600) SpO2:  [99 %-100 %] 99 % (12/04 0600) Weight:  [51.256 kg (113 lb)] 51.256 kg (113 lb) (12/04 0040)  No results for input(s): GLUCAP in the last 168 hours.  Recent Labs Lab 07/20/14 1130 07/24/14 1500 07/25/14 0453  NA 140 149* 154*  K 4.4 4.3 4.3  CL 103 110 115*  CO2 24 23 20   GLUCOSE 87 100* 71  BUN 20 42* 38*  CREATININE 1.10 1.44* 1.35   CALCIUM 9.5 9.7 9.3  MG  --  2.4  --   PHOS  --  3.1 3.4    Recent Labs Lab 07/20/14 1130 07/24/14 1500 07/25/14 0453  AST 23 29 27   ALT 12 15 14   ALKPHOS 131* 112 101  BILITOT 1.4* 1.0 0.9  PROT 6.2 6.1 5.7*  ALBUMIN 3.3* 3.5 3.2*    Recent Labs Lab 07/20/14 1130 07/25/14 0453  WBC 5.4 5.9  NEUTROABS 4.2  --   HGB 13.7 13.0  HCT 40.5 40.6  MCV 93.5 95.3  PLT 128* 152   No results for input(s): CKTOTAL, CKMB, CKMBINDEX, TROPONINI in the last 168 hours. No results for input(s): LABPROT, INR in the last 72 hours.  Recent Labs  07/24/14 1441  COLORURINE YELLOW  LABSPEC 1.017  PHURINE 5.5  GLUCOSEU NEGATIVE  HGBUR NEGATIVE  BILIRUBINUR MODERATE*  KETONESUR 40*  PROTEINUR NEGATIVE  UROBILINOGEN 0.2  NITRITE NEGATIVE  LEUKOCYTESUR NEGATIVE       Component Value Date/Time   CHOL 169 07/24/2014 1500   TRIG 102 07/24/2014 1500   HDL 51 07/24/2014 1500   CHOLHDL 3.3 07/24/2014 1500   VLDL 20 07/24/2014 1500   LDLCALC 98 07/24/2014 1500   Lab Results  Component Value Date   HGBA1C 5.4 07/24/2014   No results found for: LABOPIA, COCAINSCRNUR, LABBENZ, AMPHETMU, THCU, LABBARB  No results for input(s): ETH in the last 168 hours.  Dg Chest 2 View 07/24/2014    Improved right pleural effusion. There is persistent opacity at  the right lung base.  Mild left basilar atelectasis.      Ct Head Wo Contrast 07/24/2014    No acute intracranial pathology.    Mr Brain Wo Contrast 07/24/2014    MRI HEAD IMPRESSION:   1. No acute intracranial infarct or other abnormality identified.  2. Moderate atrophy with chronic small vessel ischemic disease.   MRA HEAD IMPRESSION:   Normal MRA of the intracranial circulation with no proximal branch occlusion or hemodynamically significant stenosis identified.     Dg Op Swallowing Func-medicare/speech Path 07/23/2014    There was silent aspiration with both swallows and retention of barium.   Nectar thick liquid- administered  with a spoon. There was premature spill and silent aspiration. Retention of barium is identified.  Honey- deferred.  Puree- deferred.  Cracker-deferred.  Puree with cracker- deferred.  Barium tablet -  deferred.   IMPRESSION: As above.  Please refer to the Speech Pathologists report for complete details and recommendations.  Insert swallowing function Please select appropriate template.      2D Echocardiogram  05/22/2014 EF 60% with no source of embolus.    PHYSICAL EXAM   ASSESSMENT/PLAN Mr. Jordan Gallusarl L Flaugher is a 80 y.o. male with history of recurrent exudative effusion (which will not be worked up per patient and patient's family), CHF, hip replacement x 2 and BPH presenting with 1 week of dysphagia. He did not receive IV t-PA due to delay in arrival.   No stroke by MRI. Doubt TIA. Workup for Myasthenia Gravis.  Resultant  Speech and swallowing difficulties.  MRI  No acute infarct, small vessel disease   MRA  Unremarkable   Carotid Doppler  pending   2D Echo  No source of embolus   LDL 98. Consider statin (not mandated as MRI negative for stroke).  HgbA1c 5.4  SCDs for VTE prophylaxis  Diet NPO time specified. Baseline dysphagia. PEG planned.  no antithrombotics prior to admission, now on no antithrombotics  Therapy recommendations: Pending  Disposition:  Pending  Other Stroke Risk Factors  Advanced age  Other Active Problems  Baseline dysphagia. Needs PEG.   FTT  CKD stage 3  Severe malnutrition, Body mass index is 20.02 kg/(m^2).  ectropion on R   Other Pertinent History  CHF  Hospital day # 1  Hassel NethDavid Nesta Kimple PA-C [facility] Pager [phone] 07/25/2014, 2:53 PM     To contact Stroke Continuity provider, please refer to [email or web address]. After hours, contact General Neurology

## 2014-07-25 NOTE — Progress Notes (Addendum)
Reeves Forth, PA-C Physician Assistant Signed Neurology Progress Notes 07/25/2014 9:00 AM    Expand All Collapse All   STROKE TEAM PROGRESS NOTE   HISTORY [name] is an 80 y.o. male who was noted to to have difficulty swallowing liquids on thanksgiving 07/15/2014 (time unknown) and the next day he was noted to have more difficulty with meats and swallowing. For the past week he has had more difficulty with both pills, water and solids. The daughter at bedside states his voice has also been "nasal" but seems to become worse the more he speaks. He denies any extremity weakness, diplopia, numbness or tinging. Today he was noted to have difficulties swallowing medications and has been made NPO. SLP obtained a MBS and noted "moderate oral and gross pharyngeal, cervical esophageal dysphagia with nearly absent muscle contraction. Only a few boluses of liquids were provided to pt due to severity of dysphagia. Gross pharyngeal stasis with penetration/aspiration noted with both consistences."  Of note, daughter states he has had previous episodes of difficulty swallowing in the past but these were breif and resolved spontaneously.   Patient was not administered TPA secondary to delay in arrival. He was admitted for further evaluation and treatment.   SUBJECTIVE (INTERVAL HISTORY) Multiple family members present. Pt still having difficulty speaking and swallowing. Apparently this has been a re occuring problem related to stress and illness.   OBJECTIVE Temp: [97.5 F (36.4 C)-98.1 F (36.7 C)] 97.7 F (36.5 C) (12/04 0600) Pulse Rate: [75-88] 75 (12/04 0600) Cardiac Rhythm: [-]  Resp: [16] 16 (12/04 0600) BP: (117-132)/(57-64) 132/57 mmHg (12/04 0600) SpO2: [99 %-100 %] 99 % (12/04 0600) Weight: [51.256 kg (113 lb)] 51.256 kg (113 lb) (12/04 0040)   Last Labs     No results for input(s): GLUCAP in the last 168 hours.    Last Labs      Recent Labs Lab 07/20/14 1130  07/24/14 1500 07/25/14 0453  NA 140 149* 154*  K 4.4 4.3 4.3  CL 103 110 115*  CO2 24 23 20   GLUCOSE 87 100* 71  BUN 20 42* 38*  CREATININE 1.10 1.44* 1.35  CALCIUM 9.5 9.7 9.3  MG --  2.4 --   PHOS --  3.1 3.4      Last Labs      Recent Labs Lab 07/20/14 1130 07/24/14 1500 07/25/14 0453  AST 23 29 27   ALT 12 15 14   ALKPHOS 131* 112 101  BILITOT 1.4* 1.0 0.9  PROT 6.2 6.1 5.7*  ALBUMIN 3.3* 3.5 3.2*      Last Labs      Recent Labs Lab 07/20/14 1130 07/25/14 0453  WBC 5.4 5.9  NEUTROABS 4.2 --   HGB 13.7 13.0  HCT 40.5 40.6  MCV 93.5 95.3  PLT 128* 152      Last Labs     No results for input(s): CKTOTAL, CKMB, CKMBINDEX, TROPONINI in the last 168 hours.    Recent Labs (last 2 labs)     No results for input(s): LABPROT, INR in the last 72 hours.    Recent Labs (last 2 labs)      Recent Labs  07/24/14 1441  COLORURINE YELLOW  LABSPEC 1.017  PHURINE 5.5  GLUCOSEU NEGATIVE  HGBUR NEGATIVE  BILIRUBINUR MODERATE*  KETONESUR 40*  PROTEINUR NEGATIVE  UROBILINOGEN 0.2  NITRITE NEGATIVE  LEUKOCYTESUR NEGATIVE       Labs (Brief)       Component Value Date/Time   CHOL  169 07/24/2014 1500   TRIG 102 07/24/2014 1500   HDL 51 07/24/2014 1500   CHOLHDL 3.3 07/24/2014 1500   VLDL 20 07/24/2014 1500   LDLCALC 98 07/24/2014 1500      Recent Labs    Lab Results  Component Value Date   HGBA1C 5.4 07/24/2014      Labs (Brief)    No results found for: LABOPIA, COCAINSCRNUR, LABBENZ, AMPHETMU, THCU, LABBARB     Last Labs     No results for input(s): ETH in the last 168 hours.    Dg Chest 2 View 07/24/2014  Improved right pleural effusion. There is persistent opacity at the right lung base. Mild left basilar atelectasis.    Ct Head Wo Contrast 07/24/2014  No acute intracranial  pathology.   Mr Brain Wo Contrast 07/24/2014  MRI HEAD IMPRESSION:  1. No acute intracranial infarct or other abnormality identified.  2. Moderate atrophy with chronic small vessel ischemic disease.  MRA HEAD IMPRESSION:  Normal MRA of the intracranial circulation with no proximal branch occlusion or hemodynamically significant stenosis identified.   Dg Op Swallowing Func-medicare/speech Path 07/23/2014  There was silent aspiration with both swallows and retention of barium.  Nectar thick liquid- administered with a spoon. There was premature spill and silent aspiration. Retention of barium is identified. Honey- deferred. Puree- deferred. Cracker-deferred. Puree with cracker- deferred. Barium tablet - deferred.  IMPRESSION: As above. Please refer to the Speech Pathologists report for complete details and recommendations. Insert swallowing function Please select appropriate template.     2D Echocardiogram 05/22/2014 EF 60% with no source of embolus.    PHYSICAL EXAM Frail elderly malnourished cachectic Caucasian male not in distress. Bilateral ectropion both lower  Awake alert. Afebrile. Head is nontraumatic. Neck is supple without bruit. Hearing is normal. Cardiac exam no murmur or gallop. Lungs are clear to auscultation. Distal pulses are well felt. Neurological Exam : Awake alert oriented 3. Voice is soft and hypophonic but can be understood. No dysarthria or aphasia. Follows commands well. Extraocular movements are full range without nystagmus. Fundi were not visualized. Vision acuity seems adequate. Minimum bifacial weakness. Tongue is midline. Weak cough and gag. Good neck strength. No upper or lower extremity drift. No focal weakness. Deep tendon reflexes are 2+ symmetric. Plantars are downgoing. Sensation is preserved bilaterally. Gait was not tested. Patient does not have any  Resting ptosis at rest or even upon sustained upgaze for 1  minute. ASSESSMENT/PLAN Mr. Jordan Gallusarl L Horn is a 80 y.o. male with history of recurrent exudative effusion (which will not be worked up per patient and patient's family), CHF, hip replacement x 2 and BPH presenting with 1 week of dysphagia. He did not receive IV t-PA due to delay in arrival.   No stroke by MRI. Doubt TIA. Workup for Myasthenia Gravis, mechanical causes of dysphagia and occult malignancy for paraneoplastic syndrome.  Resultant Speech and swallowing difficulties.  MRI No acute infarct, small vessel disease   MRA Unremarkable   Carotid Doppler pending  2D Echo No source of embolus   LDL 98. Consider statin (not mandated as MRI negative for stroke).  HgbA1c 5.4  SCDs for VTE prophylaxis  Diet NPO time specified. Baseline dysphagia. PEG planned.  no antithrombotics prior to admission, now on no antithrombotics  Therapy recommendations: Pending  Disposition: Pending  Other Stroke Risk Factors  Advanced age  Other Active Problems  Baseline dysphagia. Needs PEG.   FTT  CKD stage 3  Severe malnutrition, Body mass  index is 20.02 kg/(m^2).  ectropion on R  Other Pertinent History  CHF  Hospital day # 1  Delton SeeDavid Rinehuls PA-C [facility] Pager [phone]     I have personally examined this patient, reviewed notes, independently viewed imaging studies, participated in medical decision making and plan of care. I have made any additions or clarifications directly to the above note. Agree with note above. I think his recurrent episodes of dysphagia, dysphonia are unlikely to be TIAs and neurovascular in origin. Other possibilities include mechanical obstruction versus myasthenia gravis or paraneoplastic etiology. Recommend CT scan of the chest /abdomen &pelvis for malignancy as well as check acetylcholine receptor antibodies and GI consult for upper endoscopy to look for mechanical causes. May consider outpatient nerve conduction  study with rapid repetitive stimulation for myasthenia if above workup is negative. D/w Dr Penny Piaorlando Vega , patient, family and answered questions. Stroke team will sign off. Kindly call neurohospitalist team for further neurological issues Delia HeadyPramod Rune Mendez, MD Medical Director [facility] Pager: [phone] 07/25/2014 4:11 PM

## 2014-07-25 NOTE — Care Management (Signed)
IM given. Anthany Thornhill RN BSN  

## 2014-07-25 NOTE — Progress Notes (Signed)
Occupational Therapy Evaluation Patient Details Name: Audrie Gallusarl L Handley MRN: [id number] DOB: 12/20/1924 Today's Date: 07/25/2014    History of Present Illness Pt admit with dysphagia and stroke workup that has been negative thus far.     Clinical Impression   PTA pt lived at home with his wife and was independent with use of cane and RW, however does report that his wife would occasionally assist with ADLs. Pt and wife will be moving in with his daughter next week, which is very accessible. Pt reports that he receives PT at home 1x per week and does not feel that he has additional OT concerns. All education and training completed for safety with ADLs and fall prevention. Acute OT to sign off at this time.     Follow Up Recommendations  No OT follow up;Supervision/Assistance - 24 hour    Equipment Recommendations  None recommended by OT    Recommendations for Other Services       Precautions / Restrictions Precautions Precautions: Fall Restrictions Weight Bearing Restrictions: No      Mobility Bed Mobility Overal bed mobility: Needs Assistance Bed Mobility: Sit to Supine     Supine to sit: Min assist Sit to supine: Min assist   General bed mobility comments: Min (A) to manage BLEs onto bed. Pt required assist to scoot up in bed for comfort.   Transfers Overall transfer level: Needs assistance Equipment used: Rolling walker (2 wheeled) Transfers: Sit to/from Stand Sit to Stand: Supervision         General transfer comment: Supervision for safety. Pt has good technique.     Balance Overall balance assessment: Needs assistance;History of Falls Sitting-balance support: Bilateral upper extremity supported;Feet supported Sitting balance-Leahy Scale: Fair Sitting balance - Comments: can sit EOB without UE support once feet are on floor with posterior lean intitally   Standing balance support: Bilateral upper extremity supported;During functional activity Standing  balance-Leahy Scale: Poor Standing balance comment: Requires UE support for balance in standing.             High level balance activites: Turns;Direction changes High Level Balance Comments: min guard assist for above.             ADL Overall ADL's : Needs assistance/impaired Eating/Feeding: NPO Eating/Feeding Details (indicate cue type and reason): NPO due to dysphagia (unknown cause) Grooming: Set up;Sitting   Upper Body Bathing: Set up;Sitting   Lower Body Bathing: Sit to/from stand;Set up;Supervison/ safety   Upper Body Dressing : Set up;Sitting   Lower Body Dressing: Supervision/safety;Set up;Sit to/from stand   Toilet Transfer: Min guard;Ambulation;RW             General ADL Comments: Pt overall doing well with setup/supervision required for ADLs. Pt has PT at home 1x per week and reports that his wife helps with ADLs PRN but he is otherwise independent. Educated pt on increased safety with ADLs through fall prevention and energy conservation. Pt performed sit<>stand x5 to address activity tolerance and LB strengthening and O2 sats remained >92%.      Vision  Pt reports no change from baseline.                    Perception Perception Perception Tested?: No   Praxis Praxis Praxis tested?: Within functional limits    Pertinent Vitals/Pain Pain Assessment: No/denies pain     Hand Dominance Right   Extremity/Trunk Assessment Upper Extremity Assessment Upper Extremity Assessment: Generalized weakness   Lower Extremity Assessment Lower Extremity  Assessment: Generalized weakness   Cervical / Trunk Assessment Cervical / Trunk Assessment: Kyphotic   Communication Communication Communication: No difficulties;Expressive difficulties (some nasality and difficulty with enunciation)   Cognition Arousal/Alertness: Awake/alert Behavior During Therapy: WFL for tasks assessed/performed Overall Cognitive Status: Within Functional Limits for tasks  assessed                                Home Living Family/patient expects to be discharged to:: Private residence Living Arrangements: Spouse/significant other;Children (will move into daughter's house on Thursday 12/10) Available Help at Discharge: Family;Available 24 hours/day Type of Home: House Home Access: Stairs to enter Entergy CorporationEntrance Stairs-Number of Steps: 2 Entrance Stairs-Rails: Right;Left;Can reach both Home Layout: Two level (with elevator)     Bathroom Shower/Tub: Producer, television/film/videoWalk-in shower   Bathroom Toilet: Handicapped height     Home Equipment: Cane - single point;Walker - 2 wheels;Grab bars - toilet;Grab bars - tub/shower;Hand held shower head;Wheelchair - manual;Shower seat - built in   Additional Comments: pt and wife plan to go live with daughter on d/c.        Prior Functioning/Environment Level of Independence: Independent with assistive device(s)        Comments: used cane and RW but best with RW    OT Diagnosis: Generalized weakness    End of Session Equipment Utilized During Treatment: Gait belt;Rolling walker  Activity Tolerance: Patient tolerated treatment well Patient left: in bed;with call bell/phone within reach;with family/visitor present   Time: [id number] OT Time Calculation (min): 22 min Charges:  OT General Charges $OT Visit: 1 Procedure OT Evaluation $Initial OT Evaluation Tier I: 1 Procedure OT Treatments $Therapeutic Activity: 8-22 mins  Rae LipsMiller, Grason Brailsford M 07/25/2014, 5:36 PM  Yehuda MaoLeeAnn Guido SanderMarie Torria Fromer, OTR/L Occupational Therapist [phone])

## 2014-07-25 NOTE — Evaluation (Signed)
Physical Therapy Evaluation Patient Details Name: [name] MRN: [id number] DOB: 24-Jan-1925 Today's Date: 07/25/2014   History of Present Illness  Pt admit with dysphagia and stroke workup that has been negative thus far.    Clinical Impression  Pt admitted with above. Pt currently with functional limitations due to the deficits listed below (see PT Problem List). Pt will be ok to d/c home with 24 hour care with HHPT services.  Has RW.   Pt will benefit from skilled PT to increase their independence and safety with mobility to allow discharge to the venue listed below.   Follow Up Recommendations Home health PT;Supervision/Assistance - 24 hour    Equipment Recommendations  None recommended by PT    Recommendations for Other Services       Precautions / Restrictions Precautions Precautions: Fall Restrictions Weight Bearing Restrictions: No      Mobility  Bed Mobility Overal bed mobility: Needs Assistance Bed Mobility: Supine to Sit     Supine to sit: Min assist     General bed mobility comments: Pt pulls up on wife or daughter with hand to hand contact.  No more than min assist needed.    Transfers Overall transfer level: Needs assistance Equipment used: Rolling walker (2 wheeled) Transfers: Sit to/from Stand Sit to Stand: Min assist         General transfer comment: Needed min assist for power up.  Once in standing able to maintain steadiness with min guard assist.   Ambulation/Gait Ambulation/Gait assistance: Min guard Ambulation Distance (Feet): 100 Feet Assistive device: Rolling walker (2 wheeled) Gait Pattern/deviations: Step-through pattern;Decreased stride length;Shuffle;Trunk flexed   Gait velocity interpretation: Below normal speed for age/gender General Gait Details: Pt needed cues to stay close to RW and stand up tall.  Pt close to baseline gait per pt and family although they state he typically doesn't fatigue as much however pt has had no food  for days.    Stairs            Wheelchair Mobility    Modified Rankin (Stroke Patients Only)       Balance Overall balance assessment: Needs assistance;History of Falls Sitting-balance support: Bilateral upper extremity supported;Feet supported Sitting balance-Leahy Scale: Fair Sitting balance - Comments: can sit EOB without UE support once feet are on floor with posterior lean intitally   Standing balance support: Bilateral upper extremity supported;During functional activity Standing balance-Leahy Scale: Poor Standing balance comment: Requires UE support for balance in standing.             High level balance activites: Turns;Direction changes High Level Balance Comments: min guard assist for above.              Pertinent Vitals/Pain Pain Assessment: No/denies pain  VSS    Home Living Family/patient expects to be discharged to:: Private residence Living Arrangements: Spouse/significant other Available Help at Discharge: Family;Available 24 hours/day Type of Home: House Home Access: Stairs to enter Entrance Stairs-Rails: Right;Left;Can reach both Entrance Stairs-Number of Steps: 2 Home Layout: Two level (with elevator) Home Equipment: Cane - single point;Walker - 2 wheels;Grab bars - toilet;Grab bars - tub/shower;Hand held shower head;Wheelchair - manual;Shower seat - built in (handicapped height toilet) Additional Comments: pt and wife plan to go live with daughter on d/c.      Prior Function Level of Independence: Independent with assistive device(s)         Comments: used cane and RW but best with RW     Hand  Dominance   Dominant Hand: Right    Extremity/Trunk Assessment   Upper Extremity Assessment: Defer to OT evaluation           Lower Extremity Assessment: Generalized weakness      Cervical / Trunk Assessment: Kyphotic  Communication   Communication: No difficulties  Cognition Arousal/Alertness: Awake/alert Behavior During  Therapy: Flat affect Overall Cognitive Status: Within Functional Limits for tasks assessed                      General Comments      Exercises        Assessment/Plan    PT Assessment Patient needs continued PT services  PT Diagnosis Generalized weakness   PT Problem List Decreased mobility;Decreased balance;Decreased activity tolerance;Decreased knowledge of use of DME;Decreased safety awareness;Decreased knowledge of precautions  PT Treatment Interventions DME instruction;Gait training;Functional mobility training;Therapeutic activities;Therapeutic exercise;Balance training;Patient/family education   PT Goals (Current goals can be found in the Care Plan section) Acute Rehab PT Goals Patient Stated Goal: to gohome PT Goal Formulation: With patient Time For Goal Achievement: 08/08/14 Potential to Achieve Goals: Good    Frequency Min 3X/week   Barriers to discharge        Co-evaluation               End of Session Equipment Utilized During Treatment: Gait belt Activity Tolerance: Patient limited by fatigue Patient left: in chair;with call bell/phone within reach;with family/visitor present Nurse Communication: Mobility status         Time: [id number] PT Time Calculation (min) (ACUTE ONLY): 27 min   Charges:   PT Evaluation $Initial PT Evaluation Tier I: 1 Procedure PT Treatments $Gait Training: 8-22 mins   PT G CodesBerline Lopes:          Nakiesha Rumsey F 07/25/2014, 4:32 PM  Odai Wimmer,PT Acute Rehabilitation [phone] (pager)

## 2014-07-25 NOTE — Progress Notes (Signed)
[facility] PROGRESS NOTE  [name] ZOX:[id number] DOB: 1925/06/15 DOA: 07/24/2014 PCP: [name], NP  Assessment/Plan:  Principal Problem:   Dysphagia - Etiology uncertain. MRI negative for stroke - Neurology on board and considering other etiology and currently working up for myesthenia gravis - Pending results may have to consider feeding tube. Discussed with family   Active Problems:   Diastolic CHF, acute - compensated currently    Constipation - no new complaints    CKD (chronic kidney disease) stage 3, GFR 30-59 ml/min - stable - Serum creatinine on last check at 1.3    BPH (benign prostatic hyperplasia) - stable, S creatinine trending down    Severe malnutrition - 2ary to principle problem, currently undergoing work up - Consideration for peg tube in place pending how patient's dose clinically.  Code Status: DNR Family Communication: Discussed with daughter and spouse at bedside Disposition Plan: Pending results of work up.   Consultants:  Neurology  Procedures:  MRI/CT of brain and head  Carotid doppler  Echocardiogram  Antibiotics:  None  HPI/Subjective: Pt has no new complaints. Daughter reports that his "eyes look more droopy."   Objective: Filed Vitals:   07/25/14 0600  BP: 132/57  Pulse: 75  Temp: 97.7 F (36.5 C)  Resp: 16    Intake/Output Summary (Last 24 hours) at 07/25/14 1430 Last data filed at 07/25/14 1430  Gross per 24 hour  Intake    450 ml  Output    475 ml  Net    -25 ml   Filed Weights   07/25/14 0040  Weight: 51.256 kg (113 lb)    Exam:   General:  Pt in nad, alert and awake  Cardiovascular: rrr, no mrg  Respiratory: cta bl, no wheezes  Abdomen: soft, NT, ND  Musculoskeletal: no cyanosis or clubbing   Data Reviewed: Basic Metabolic Panel:  Recent Labs Lab 07/20/14 1130 07/24/14 1500 07/25/14 0453  NA 140 149* 154*  K 4.4 4.3 4.3  CL 103 110 115*  CO2 24 23 20    GLUCOSE 87 100* 71  BUN 20 42* 38*  CREATININE 1.10 1.44* 1.35  CALCIUM 9.5 9.7 9.3  MG  --  2.4  --   PHOS  --  3.1 3.4   Liver Function Tests:  Recent Labs Lab 07/20/14 1130 07/24/14 1500 07/25/14 0453  AST 23 29 27   ALT 12 15 14   ALKPHOS 131* 112 101  BILITOT 1.4* 1.0 0.9  PROT 6.2 6.1 5.7*  ALBUMIN 3.3* 3.5 3.2*   No results for input(s): LIPASE, AMYLASE in the last 168 hours. No results for input(s): AMMONIA in the last 168 hours. CBC:  Recent Labs Lab 07/20/14 1130 07/25/14 0453  WBC 5.4 5.9  NEUTROABS 4.2  --   HGB 13.7 13.0  HCT 40.5 40.6  MCV 93.5 95.3  PLT 128* 152   Cardiac Enzymes: No results for input(s): CKTOTAL, CKMB, CKMBINDEX, TROPONINI in the last 168 hours. BNP (last 3 results)  Recent Labs  12/27/13 0950 05/20/14 1728 07/20/14 1130  PROBNP 347.2 283.4 383.1   CBG: No results for input(s): GLUCAP in the last 168 hours.  Recent Results (from the past 240 hour(s))  Body fluid culture     Status: None   Collection Time: 07/21/14  9:56 AM  Result Value Ref Range Status   Specimen Description PLEURAL RIGHT  Final   Special Requests NONE  Final   Gram Stain   Final    RARE WBC  PRESENT,BOTH PMN AND MONONUCLEAR NO ORGANISMS SEEN Performed at [facility]    Culture   Final    NO GROWTH 3 DAYS Performed at [facility]    Report Status 07/24/2014 FINAL  Final     Studies: Dg Chest 2 View  07/24/2014   CLINICAL DATA:  Cough for 1 week  EXAM: CHEST  2 VIEW  COMPARISON:  07/21/2014  FINDINGS: Right pleural effusion improved. Persistent opacity at the right lung base. Left lung is under aerated with mild basilar atelectasis. Skin fold along the left lateral hemi thorax at the costophrenic angle is suspected and not significantly changed. Normal heart size.  IMPRESSION: Improved right pleural effusion. There is persistent opacity at the right lung base.  Mild left basilar atelectasis.   Electronically Signed   By: Maryclare BeanArt  Hoss  M.D.   On: 07/24/2014 18:56   Ct Head Wo Contrast  07/24/2014   CLINICAL DATA:  Aspiration.  Dysphagia.  EXAM: CT HEAD WITHOUT CONTRAST  TECHNIQUE: Contiguous axial images were obtained from the base of the skull through the vertex without intravenous contrast.  COMPARISON:  05/20/2014  FINDINGS: Chronic ischemic changes in the periventricular white matter. Global atrophy appropriate to age. No mass effect, midline shift, or acute intracranial hemorrhage. Cranium is intact.  IMPRESSION: No acute intracranial pathology.   Electronically Signed   By: Maryclare BeanArt  Hoss M.D.   On: 07/24/2014 18:37   Mr Brain Wo Contrast  07/24/2014   CLINICAL DATA:  Initial evaluation for a dysphagia, dysarthria a. question stroke.  EXAM: MRI HEAD WITHOUT CONTRAST  MRA HEAD WITHOUT CONTRAST  TECHNIQUE: Multiplanar, multiecho pulse sequences of the brain and surrounding structures were obtained without intravenous contrast. Angiographic images of the head were obtained using MRA technique without contrast.  COMPARISON:  None.  Prior CT performed earlier the same day as well as previous MRI from 05/21/2014.  FINDINGS: MRI HEAD FINDINGS  Diffuse prominence of the CSF containing spaces is compatible with moderate generalized cerebral atrophy. Patchy T2/FLAIR hyperintensity within the periventricular and deep white matter most consistent with mild chronic small vessel ischemic disease.  No focal parenchymal signal abnormality is identified. No mass lesion, midline shift, or extra-axial fluid collection. Ventricles are normal in size without evidence of hydrocephalus.  No diffusion-weighted signal abnormality is identified to suggest acute intracranial infarct. Gray-white matter differentiation is maintained. Normal flow voids are seen within the intracranial vasculature. No intracranial hemorrhage identified. Susceptibility artifact within the frontal lobes bilaterally again seen, compatible with remote blood products, perhaps related to  remote trauma.  The cervicomedullary junction is normal. Pituitary gland is within normal limits. Pituitary stalk is midline. The globes and optic nerves demonstrate a normal appearance with normal signal intensity.  The bone marrow signal intensity is normal. Calvarium is intact. Visualized upper cervical spine is within normal limits.  Scalp soft tissues are unremarkable.  Paranasal sinuses are clear.  No mastoid effusion.  MRA HEAD FINDINGS  ANTERIOR CIRCULATION:  The visualized portions of the distal cervical internal carotid arteries are widely patent with antegrade flow. The petrous, cavernous, and supra clinoid segments are symmetric in caliber bilaterally. A1 segments, anterior communicating artery, and anterior cerebral arteries are widely patent. Middle cerebral arteries are widely patent with antegrade flow without high-grade flow-limiting stenosis or proximal branch occlusion. Distal MCA branches are symmetric bilaterally. No intracranial aneurysm within the anterior circulation.  POSTERIOR CIRCULATION:  The left vertebral artery is dominant. Vertebral arteries are widely patent with antegrade flow. Posterior  inferior cerebral arteries patent. Vertebrobasilar junction within normal limits. Basilar artery is mildly tortuous but widely patent. No basilar tip stenosis or aneurysm. Posterior cerebral arteries well opacified bilaterally. Superior cerebellar arteries are patent.  IMPRESSION: MRI HEAD IMPRESSION:  1. No acute intracranial infarct or other abnormality identified. 2. Moderate atrophy with chronic small vessel ischemic disease.  MRA HEAD IMPRESSION:  Normal MRA of the intracranial circulation with no proximal branch occlusion or hemodynamically significant stenosis identified.   Electronically Signed   By: Rise Mu M.D.   On: 07/24/2014 23:10   Mr Maxine Glenn Head/brain Wo Cm  07/24/2014   CLINICAL DATA:  Initial evaluation for a dysphagia, dysarthria a. question stroke.  EXAM: MRI HEAD  WITHOUT CONTRAST  MRA HEAD WITHOUT CONTRAST  TECHNIQUE: Multiplanar, multiecho pulse sequences of the brain and surrounding structures were obtained without intravenous contrast. Angiographic images of the head were obtained using MRA technique without contrast.  COMPARISON:  None.  Prior CT performed earlier the same day as well as previous MRI from 05/21/2014.  FINDINGS: MRI HEAD FINDINGS  Diffuse prominence of the CSF containing spaces is compatible with moderate generalized cerebral atrophy. Patchy T2/FLAIR hyperintensity within the periventricular and deep white matter most consistent with mild chronic small vessel ischemic disease.  No focal parenchymal signal abnormality is identified. No mass lesion, midline shift, or extra-axial fluid collection. Ventricles are normal in size without evidence of hydrocephalus.  No diffusion-weighted signal abnormality is identified to suggest acute intracranial infarct. Gray-white matter differentiation is maintained. Normal flow voids are seen within the intracranial vasculature. No intracranial hemorrhage identified. Susceptibility artifact within the frontal lobes bilaterally again seen, compatible with remote blood products, perhaps related to remote trauma.  The cervicomedullary junction is normal. Pituitary gland is within normal limits. Pituitary stalk is midline. The globes and optic nerves demonstrate a normal appearance with normal signal intensity.  The bone marrow signal intensity is normal. Calvarium is intact. Visualized upper cervical spine is within normal limits.  Scalp soft tissues are unremarkable.  Paranasal sinuses are clear.  No mastoid effusion.  MRA HEAD FINDINGS  ANTERIOR CIRCULATION:  The visualized portions of the distal cervical internal carotid arteries are widely patent with antegrade flow. The petrous, cavernous, and supra clinoid segments are symmetric in caliber bilaterally. A1 segments, anterior communicating artery, and anterior cerebral  arteries are widely patent. Middle cerebral arteries are widely patent with antegrade flow without high-grade flow-limiting stenosis or proximal branch occlusion. Distal MCA branches are symmetric bilaterally. No intracranial aneurysm within the anterior circulation.  POSTERIOR CIRCULATION:  The left vertebral artery is dominant. Vertebral arteries are widely patent with antegrade flow. Posterior inferior cerebral arteries patent. Vertebrobasilar junction within normal limits. Basilar artery is mildly tortuous but widely patent. No basilar tip stenosis or aneurysm. Posterior cerebral arteries well opacified bilaterally. Superior cerebellar arteries are patent.  IMPRESSION: MRI HEAD IMPRESSION:  1. No acute intracranial infarct or other abnormality identified. 2. Moderate atrophy with chronic small vessel ischemic disease.  MRA HEAD IMPRESSION:  Normal MRA of the intracranial circulation with no proximal branch occlusion or hemodynamically significant stenosis identified.   Electronically Signed   By: Rise Mu M.D.   On: 07/24/2014 23:10    Scheduled Meds: . antiseptic oral rinse  7 mL Mouth Rinse q12n4p  . chlorhexidine  15 mL Mouth Rinse BID   Continuous Infusions: . sodium chloride 50 mL/hr at 07/25/14 1323     Time spent: > 35 minutes    Orianna Biskup  [facility] Pager [phone] If 7PM-7AM, please contact night-coverage at [email or web address], password [email or web address] 07/25/2014, 2:30 PM  LOS: 1 day

## 2014-07-26 ENCOUNTER — Encounter (HOSPITAL_COMMUNITY): Payer: Self-pay | Admitting: Radiology

## 2014-07-26 ENCOUNTER — Inpatient Hospital Stay (HOSPITAL_COMMUNITY): Payer: Medicare Other

## 2014-07-26 DIAGNOSIS — E870 Hyperosmolality and hypernatremia: Secondary | ICD-10-CM | POA: Diagnosis present

## 2014-07-26 MED ORDER — IOHEXOL 300 MG/ML  SOLN
100.0000 mL | Freq: Once | INTRAMUSCULAR | Status: AC | PRN
  Administered 2014-07-26: 100 mL via INTRAVENOUS

## 2014-07-26 MED ORDER — KCL IN DEXTROSE-NACL 20-5-0.45 MEQ/L-%-% IV SOLN
INTRAVENOUS | Status: DC
  Administered 2014-07-26 – 2014-07-31 (×10): via INTRAVENOUS
  Filled 2014-07-26 (×16): qty 1000

## 2014-07-26 MED ORDER — SODIUM CHLORIDE 0.9 % IV SOLN
INTRAVENOUS | Status: DC
  Administered 2014-07-26: 10:00:00 via INTRAVENOUS

## 2014-07-26 NOTE — Consult Note (Signed)
Subjective:   HPI  The patient is an 80 year old male who we are asked to see in regards to dysphagia to both solids and liquids. He has been experiencing dysphagia for the past week. He has been seen by neurology and he has had a modified barium swallow. The modified barium swallow showed moderate oral and gross pharyngeal cervical esophageal dysphagia with nearly absent muscle contraction. There was some silent aspiration and penetration. He has had problems with dysphagia in the past when he was ill with other medical issues but these seem to resolve. There is no obvious stroke. Neurology requested EGD to evaluate the esophagus.     Past Medical History  Diagnosis Date  . Gout   . Insomnia   . BPH (benign prostatic hyperplasia)   . Kidney disorder   . Arthritis   . Hypertension     patient denies on 10/25/12 on no meds   . Complication of anesthesia     hard to wake up -2011 after hernia surgery   . Anemia   . CHF (congestive heart failure)   . Prostate disorder   . Hernia, abdominal    Past Surgical History  Procedure Laterality Date  . Cataract extraction    . Cholecystectomy    . Hernia repair  02/22/11    right  . Joint replacement      fractured hip  . Total hip arthroplasty Right 10/29/2012    Procedure: CONVERSION OF PREVIOUS HIP SURGERY TO RIGHT TOTAL HIP AND REMOVAL OF IM NAIL ;  Surgeon: Shelda PalMatthew D Olin, MD;  Location: [facility];  Service: Orthopedics;  Laterality: Right;  . Esophagogastroduodenoscopy N/A 11/16/2012    Procedure: ESOPHAGOGASTRODUODENOSCOPY (EGD);  Surgeon: Theda BelfastPatrick D Hung, MD;  [facility];  Service: Endoscopy;  Laterality: N/A;  . Intramedullary (im) nail intertrochanteric Left 10/17/2013    Procedure: INTRAMEDULLARY (IM) NAIL INTERTROCHANTRIC;  Surgeon: Shelda PalMatthew D Olin, MD;  Location: [facility];  Service: Orthopedics;  Laterality: Left;   History   Social History  . Marital Status: Married    Spouse Name: N/A    Number of Children: N/A  . Years  of Education: N/A   Occupational History  . Not on file.   Social History Main Topics  . Smoking status: Never Smoker   . Smokeless tobacco: Never Used  . Alcohol Use: No  . Drug Use: No  . Sexual Activity: No   Other Topics Concern  . Not on file   Social History Narrative   Worked for [employer]   Also did map drawing   family history includes Cancer in his brother; Diabetes in his daughter; Heart failure in his father and mother. Current facility-administered medications: acetaminophen (TYLENOL) tablet 650 mg, 650 mg, Oral, Q4H PRN **OR** acetaminophen (TYLENOL) suppository 650 mg, 650 mg, Rectal, Q4H PRN, Tora KindredMarianne L York, PA-C;  antiseptic oral rinse (CPC / CETYLPYRIDINIUM CHLORIDE 0.05%) solution 7 mL, 7 mL, Mouth Rinse, q12n4p, Costin Otelia SergeantM Gherghe, MD, 7 mL at 07/26/14 1311;  artificial tears (LACRILUBE) ophthalmic ointment, , Both Eyes, PRN, Costin Otelia SergeantM Gherghe, MD bisacodyl (DULCOLAX) suppository 10 mg, 10 mg, Rectal, Daily PRN, Stephani PoliceMarianne L York, PA-C;  chlorhexidine (PERIDEX) 0.12 % solution 15 mL, 15 mL, Mouth Rinse, BID, Costin Otelia SergeantM Gherghe, MD, 15 mL at 07/26/14 1011;  dextrose 5 % and 0.45 % NaCl with KCl 20 mEq/L infusion, , Intravenous, Continuous, Penny Piarlando Vega, MD, Last Rate: 90 mL/hr at 07/26/14 1311 naphazoline (NAPHCON) 0.1 % ophthalmic solution 1 drop, 1 drop, Both  Eyes, QID PRN, Stephani PoliceMarianne L York, PA-C Allergies  Allergen Reactions  . Sertraline Nausea Only  . Nsaids Other (See Comments)    history of renal failure     Objective:     BP 134/65 mmHg  Pulse 82  Temp(Src) 97.8 F (36.6 C) (Oral)  Resp 16  Ht 5\' 3"  (1.6 m)  Wt 51.256 kg (113 lb)  BMI 20.02 kg/m2  SpO2 99%  He is in no distress  Heart regular rhythm no murmurs  Lungs clear  Abdomen: Bowel sounds normal, soft, nontender  Laboratory No components found for: D1    Assessment:     Dysphagia etiology unclear. Rule out esophageal lesion.      Plan:     Proceed with EGD to evaluate

## 2014-07-26 NOTE — Progress Notes (Signed)
[facility] PROGRESS NOTE  Jordan Gallusarl L Horn WUJ:[id number] DOB: 1925/02/10 DOA: 07/24/2014 PCP: Sharon SellerEUBANKS, JESSICA K, NP  Assessment/Plan:  Principal Problem:   Dysphagia - Etiology uncertain. MRI negative for stroke - Neurology on board and considering other etiology and currently working up for myesthenia gravis - Pending results may have to consider feeding tube. Discussed with family - Will consult GI for further evaluation. - obtain CT of chest/abdomen/pelvis looking for occult neoplasm that may be contributing to patient's symptoms.  Active Problems:   Diastolic CHF, acute - compensated currently    Constipation - no new complaints    CKD (chronic kidney disease) stage 3, GFR 30-59 ml/min - stable - Serum creatinine on last check at 1.3    BPH (benign prostatic hyperplasia) - stable, S creatinine trending down    Severe malnutrition - 2ary to principle problem, currently undergoing work up - Consideration for peg tube in place pending how patient's does clinically.  Code Status: DNR Family Communication: Discussed with daughter and spouse at bedside Disposition Plan: Pending results of work up.   Consultants:  Neurology  Gastroenterology  Procedures:  MRI/CT of brain and head  Carotid doppler  Echocardiogram  Antibiotics:  None  HPI/Subjective: Pt has no new complaints. No acute issues reported overnight.  Objective: Filed Vitals:   07/26/14 0528  BP: 134/65  Pulse: 82  Temp: 97.8 F (36.6 C)  Resp: 16    Intake/Output Summary (Last 24 hours) at 07/26/14 1333 Last data filed at 07/26/14 1018  Gross per 24 hour  Intake    800 ml  Output      0 ml  Net    800 ml   Filed Weights   07/25/14 0040  Weight: 51.256 kg (113 lb)    Exam:   General:  Pt in nad, alert and awake  Cardiovascular: rrr, no mrg  Respiratory: cta bl, no wheezes  Abdomen: soft, NT, ND  Musculoskeletal: no cyanosis or clubbing   Data Reviewed: Basic  Metabolic Panel:  Recent Labs Lab 07/20/14 1130 07/24/14 1500 07/25/14 0453  NA 140 149* 154*  K 4.4 4.3 4.3  CL 103 110 115*  CO2 24 23 20   GLUCOSE 87 100* 71  BUN 20 42* 38*  CREATININE 1.10 1.44* 1.35  CALCIUM 9.5 9.7 9.3  MG  --  2.4  --   PHOS  --  3.1 3.4   Liver Function Tests:  Recent Labs Lab 07/20/14 1130 07/24/14 1500 07/25/14 0453  AST 23 29 27   ALT 12 15 14   ALKPHOS 131* 112 101  BILITOT 1.4* 1.0 0.9  PROT 6.2 6.1 5.7*  ALBUMIN 3.3* 3.5 3.2*   No results for input(s): LIPASE, AMYLASE in the last 168 hours. No results for input(s): AMMONIA in the last 168 hours. CBC:  Recent Labs Lab 07/20/14 1130 07/25/14 0453  WBC 5.4 5.9  NEUTROABS 4.2  --   HGB 13.7 13.0  HCT 40.5 40.6  MCV 93.5 95.3  PLT 128* 152   Cardiac Enzymes: No results for input(s): CKTOTAL, CKMB, CKMBINDEX, TROPONINI in the last 168 hours. BNP (last 3 results)  Recent Labs  12/27/13 0950 05/20/14 1728 07/20/14 1130  PROBNP 347.2 283.4 383.1   CBG: No results for input(s): GLUCAP in the last 168 hours.  Recent Results (from the past 240 hour(s))  Body fluid culture     Status: None   Collection Time: 07/21/14  9:56 AM  Result Value Ref Range Status   Specimen Description PLEURAL  RIGHT  Final   Special Requests NONE  Final   Gram Stain   Final    RARE WBC PRESENT,BOTH PMN AND MONONUCLEAR NO ORGANISMS SEEN [facility]formed [facility]    Culture   Final    NO GROWTH 3 DAYS Performed at [facility]    Report Status 07/24/2014 FINAL  Final     Studies: Dg Chest 2 View  07/24/2014   CLINICAL DATA:  Cough for 1 week  EXAM: CHEST  2 VIEW  COMPARISON:  07/21/2014  FINDINGS: Right pleural effusion improved. Persistent opacity at the right lung base. Left lung is under aerated with mild basilar atelectasis. Skin fold along the left lateral hemi thorax at the costophrenic angle is suspected and not significantly changed. Normal heart size.  IMPRESSION:  Improved right pleural effusion. There is persistent opacity at the right lung base.  Mild left basilar atelectasis.   Electronically Signed   By: Maryclare BeanArt  Hoss M.D.   On: 07/24/2014 18:56   Ct Head Wo Contrast  07/24/2014   CLINICAL DATA:  Aspiration.  Dysphagia.  EXAM: CT HEAD WITHOUT CONTRAST  TECHNIQUE: Contiguous axial images were obtained from the base of the skull through the vertex without intravenous contrast.  COMPARISON:  05/20/2014  FINDINGS: Chronic ischemic changes in the periventricular white matter. Global atrophy appropriate to age. No mass effect, midline shift, or acute intracranial hemorrhage. Cranium is intact.  IMPRESSION: No acute intracranial pathology.   Electronically Signed   By: Maryclare BeanArt  Hoss M.D.   On: 07/24/2014 18:37   Ct Chest W Contrast  07/25/2014   CLINICAL DATA:  Further evaluation of right lung base density  EXAM: CT CHEST WITH CONTRAST  TECHNIQUE: Multidetector CT imaging of the chest was performed during intravenous contrast administration.  CONTRAST:  80mL OMNIPAQUE IOHEXOL 300 MG/ML  SOLN  COMPARISON:  Chest radiograph 07/24/2014  FINDINGS: Ppm there is a large right pleural effusion, larger than what would be suspected based upon the chest radiograph. As a result, there is extensive right lung compressive atelectasis.  The aerated portion of the right lung is clear except for discoid atelectasis. There is mild atelectasis posteriorly at the left lung base. In the otherwise clear.  Minimal calcification of the thoracic aorta. Heavy coronary artery calcification. No significant pericardial effusion. Trace pericardial fluid noted.  Images to the upper abdomen show a nodular contour to the liver suggesting possibility of cirrhosis. Left renal cyst partially visualized.  Severe acute appearing L1 compression deformity.  IMPRESSION: 1. Large right pleural effusion 2. Severe acute appearing L1 compression deformity 3. Appearance of the liver suggests cirrhosis   Electronically Signed    By: Esperanza Heiraymond  Rubner M.D.   On: 07/25/2014 21:06   Mr Brain Wo Contrast  07/24/2014   CLINICAL DATA:  Initial evaluation for a dysphagia, dysarthria a. question stroke.  EXAM: MRI HEAD WITHOUT CONTRAST  MRA HEAD WITHOUT CONTRAST  TECHNIQUE: Multiplanar, multiecho pulse sequences of the brain and surrounding structures were obtained without intravenous contrast. Angiographic images of the head were obtained using MRA technique without contrast.  COMPARISON:  None.  Prior CT performed earlier the same day as well as previous MRI from 05/21/2014.  FINDINGS: MRI HEAD FINDINGS  Diffuse prominence of the CSF containing spaces is compatible with moderate generalized cerebral atrophy. Patchy T2/FLAIR hyperintensity within the periventricular and deep white matter most consistent with mild chronic small vessel ischemic disease.  No focal parenchymal signal abnormality is identified. No mass lesion, midline shift,  or extra-axial fluid collection. Ventricles are normal in size without evidence of hydrocephalus.  No diffusion-weighted signal abnormality is identified to suggest acute intracranial infarct. Gray-white matter differentiation is maintained. Normal flow voids are seen within the intracranial vasculature. No intracranial hemorrhage identified. Susceptibility artifact within the frontal lobes bilaterally again seen, compatible with remote blood products, perhaps related to remote trauma.  The cervicomedullary junction is normal. Pituitary gland is within normal limits. Pituitary stalk is midline. The globes and optic nerves demonstrate a normal appearance with normal signal intensity.  The bone marrow signal intensity is normal. Calvarium is intact. Visualized upper cervical spine is within normal limits.  Scalp soft tissues are unremarkable.  Paranasal sinuses are clear.  No mastoid effusion.  MRA HEAD FINDINGS  ANTERIOR CIRCULATION:  The visualized portions of the distal cervical internal carotid arteries are  widely patent with antegrade flow. The petrous, cavernous, and supra clinoid segments are symmetric in caliber bilaterally. A1 segments, anterior communicating artery, and anterior cerebral arteries are widely patent. Middle cerebral arteries are widely patent with antegrade flow without high-grade flow-limiting stenosis or proximal branch occlusion. Distal MCA branches are symmetric bilaterally. No intracranial aneurysm within the anterior circulation.  POSTERIOR CIRCULATION:  The left vertebral artery is dominant. Vertebral arteries are widely patent with antegrade flow. Posterior inferior cerebral arteries patent. Vertebrobasilar junction within normal limits. Basilar artery is mildly tortuous but widely patent. No basilar tip stenosis or aneurysm. Posterior cerebral arteries well opacified bilaterally. Superior cerebellar arteries are patent.  IMPRESSION: MRI HEAD IMPRESSION:  1. No acute intracranial infarct or other abnormality identified. 2. Moderate atrophy with chronic small vessel ischemic disease.  MRA HEAD IMPRESSION:  Normal MRA of the intracranial circulation with no proximal branch occlusion or hemodynamically significant stenosis identified.   Electronically Signed   By: Rise Mu M.D.   On: 07/24/2014 23:10   Mr Maxine Glenn Head/brain Wo Cm  07/24/2014   CLINICAL DATA:  Initial evaluation for a dysphagia, dysarthria a. question stroke.  EXAM: MRI HEAD WITHOUT CONTRAST  MRA HEAD WITHOUT CONTRAST  TECHNIQUE: Multiplanar, multiecho pulse sequences of the brain and surrounding structures were obtained without intravenous contrast. Angiographic images of the head were obtained using MRA technique without contrast.  COMPARISON:  None.  Prior CT performed earlier the same day as well as previous MRI from 05/21/2014.  FINDINGS: MRI HEAD FINDINGS  Diffuse prominence of the CSF containing spaces is compatible with moderate generalized cerebral atrophy. Patchy T2/FLAIR hyperintensity within the  periventricular and deep white matter most consistent with mild chronic small vessel ischemic disease.  No focal parenchymal signal abnormality is identified. No mass lesion, midline shift, or extra-axial fluid collection. Ventricles are normal in size without evidence of hydrocephalus.  No diffusion-weighted signal abnormality is identified to suggest acute intracranial infarct. Gray-white matter differentiation is maintained. Normal flow voids are seen within the intracranial vasculature. No intracranial hemorrhage identified. Susceptibility artifact within the frontal lobes bilaterally again seen, compatible with remote blood products, perhaps related to remote trauma.  The cervicomedullary junction is normal. Pituitary gland is within normal limits. Pituitary stalk is midline. The globes and optic nerves demonstrate a normal appearance with normal signal intensity.  The bone marrow signal intensity is normal. Calvarium is intact. Visualized upper cervical spine is within normal limits.  Scalp soft tissues are unremarkable.  Paranasal sinuses are clear.  No mastoid effusion.  MRA HEAD FINDINGS  ANTERIOR CIRCULATION:  The visualized portions of the distal cervical internal carotid arteries are widely patent with  antegrade flow. The petrous, cavernous, and supra clinoid segments are symmetric in caliber bilaterally. A1 segments, anterior communicating artery, and anterior cerebral arteries are widely patent. Middle cerebral arteries are widely patent with antegrade flow without high-grade flow-limiting stenosis or proximal branch occlusion. Distal MCA branches are symmetric bilaterally. No intracranial aneurysm within the anterior circulation.  POSTERIOR CIRCULATION:  The left vertebral artery is dominant. Vertebral arteries are widely patent with antegrade flow. Posterior inferior cerebral arteries patent. Vertebrobasilar junction within normal limits. Basilar artery is mildly tortuous but widely patent. No  basilar tip stenosis or aneurysm. Posterior cerebral arteries well opacified bilaterally. Superior cerebellar arteries are patent.  IMPRESSION: MRI HEAD IMPRESSION:  1. No acute intracranial infarct or other abnormality identified. 2. Moderate atrophy with chronic small vessel ischemic disease.  MRA HEAD IMPRESSION:  Normal MRA of the intracranial circulation with no proximal branch occlusion or hemodynamically significant stenosis identified.   Electronically Signed   By: Rise Mu M.D.   On: 07/24/2014 23:10    Scheduled Meds: . antiseptic oral rinse  7 mL Mouth Rinse q12n4p  . chlorhexidine  15 mL Mouth Rinse BID   Continuous Infusions: . dextrose 5 % and 0.45 % NaCl with KCl 20 mEq/L 90 mL/hr at 07/26/14 1311     Time spent: > 35 minutes    Penny Pia  [facility] Pager [phone] If 7PM-7AM, please contact night-coverage at [email or web address], password [facility] 07/26/2014, 1:33 PM  LOS: 2 days

## 2014-07-26 NOTE — Plan of Care (Signed)
Problem: Phase I Progression Outcomes Goal: Pain controlled with appropriate interventions Outcome: Completed/Met Date Met:  07/26/14 Goal: OOB as tolerated unless otherwise ordered Outcome: Progressing Goal: Initial discharge plan identified Outcome: Not Applicable Date Met:  67/61/95 Goal: Voiding-avoid urinary catheter unless indicated Outcome: Progressing Goal: Hemodynamically stable Outcome: Completed/Met Date Met:  07/26/14 Goal: Other Phase I Outcomes/Goals Outcome: Not Applicable Date Met:  09/32/67

## 2014-07-27 ENCOUNTER — Encounter (HOSPITAL_COMMUNITY): Payer: Self-pay

## 2014-07-27 ENCOUNTER — Encounter (HOSPITAL_COMMUNITY)
Admission: AD | Disposition: A | Discharge status: Home / Self Care | Payer: Self-pay | Source: Ambulatory Visit | Attending: Family Medicine

## 2014-07-27 HISTORY — PX: ESOPHAGOGASTRODUODENOSCOPY: SHX5428

## 2014-07-27 LAB — BASIC METABOLIC PANEL
Anion gap: 14 (ref 5–15)
BUN: 26 mg/dL — AB (ref 6–23)
CHLORIDE: 114 meq/L — AB (ref 96–112)
CO2: 23 meq/L (ref 19–32)
Calcium: 9.2 mg/dL (ref 8.4–10.5)
Creatinine, Ser: 1.24 mg/dL (ref 0.50–1.35)
GFR calc Af Amer: 58 mL/min — ABNORMAL LOW (ref >90)
GFR calc non Af Amer: 50 mL/min — ABNORMAL LOW (ref >90)
GLUCOSE: 120 mg/dL — AB (ref 70–99)
POTASSIUM: 4.3 meq/L (ref 3.7–5.3)
SODIUM: 151 meq/L — AB (ref 137–147)

## 2014-07-27 LAB — STRIATED MUSCLE ANTIBODY

## 2014-07-27 SURGERY — EGD (ESOPHAGOGASTRODUODENOSCOPY)
Anesthesia: Moderate Sedation

## 2014-07-27 MED ORDER — MIDAZOLAM HCL 5 MG/ML IJ SOLN
INTRAMUSCULAR | Status: AC
  Filled 2014-07-27: qty 1

## 2014-07-27 MED ORDER — SODIUM CHLORIDE 0.9 % IV SOLN
INTRAVENOUS | Status: DC
  Administered 2014-07-27: 500 mL via INTRAVENOUS

## 2014-07-27 MED ORDER — FENTANYL CITRATE 0.05 MG/ML IJ SOLN
INTRAMUSCULAR | Status: DC | PRN
  Administered 2014-07-27: 20 ug via INTRAVENOUS

## 2014-07-27 MED ORDER — MIDAZOLAM HCL 10 MG/2ML IJ SOLN
INTRAMUSCULAR | Status: DC | PRN
  Administered 2014-07-27: 1 mg via INTRAVENOUS

## 2014-07-27 MED ORDER — BUTAMBEN-TETRACAINE-BENZOCAINE 2-2-14 % EX AERO
INHALATION_SPRAY | CUTANEOUS | Status: DC | PRN
  Administered 2014-07-27: 1 via TOPICAL

## 2014-07-27 MED ORDER — FENTANYL CITRATE 0.05 MG/ML IJ SOLN
INTRAMUSCULAR | Status: AC
  Filled 2014-07-27: qty 2

## 2014-07-27 NOTE — Progress Notes (Signed)
[facility] PROGRESS NOTE  [name] ZOX:[id number] DOB: [birth year] DOA: 07/24/2014 PCP: Sharon SellerEUBANKS, JESSICA K, NP  Assessment/Plan:  Principal Problem:   Dysphagia - Etiology uncertain. MRI negative for stroke - Neurology on board and considering other etiology and currently working up for myesthenia gravis - Pending results may have to consider feeding tube. Discussed with family - GI evaluation complete. Pt is s/p EGD with no etiological source identified. - obtained CT of chest/abdomen/pelvis looking for occult neoplasm that may be contributing to patient's symptoms. Currently reports cysts requiring further work up in pancrease and kidney.  Will place order for MRI for further evaluation.  Active Problems:   Diastolic CHF, acute - compensated currently    Constipation - no new complaints    CKD (chronic kidney disease) stage 3, GFR 30-59 ml/min - stable - Serum creatinine on last check at 1.2    BPH (benign prostatic hyperplasia) - stable, S creatinine trending down    Severe malnutrition - 2ary to principle problem, currently undergoing work up - Consideration for peg tube in place pending how patient's does clinically.  Code Status: DNR Family Communication: Discussed with daughter and spouse at bedside Disposition Plan: Pending results of work up.   Consultants:  Neurology  Gastroenterology  Procedures:  MRI/CT of brain and head  Carotid doppler  Echocardiogram  EGD  Antibiotics:  None  HPI/Subjective: Pt has no new complaints. No acute issues reported overnight.  Objective: Filed Vitals:   07/27/14 1350  BP: 125/79  Pulse: 80  Temp: 97.8 F (36.6 C)  Resp: 18    Intake/Output Summary (Last 24 hours) at 07/27/14 1720 Last data filed at 07/27/14 1500  Gross per 24 hour  Intake   2587 ml  Output      0 ml  Net   2587 ml   Filed Weights   07/25/14 0040  Weight: 51.256 kg (113 lb)    Exam:   General:  Pt in nad, alert  and awake  Cardiovascular: rrr, no mrg  Respiratory: cta bl, no wheezes  Abdomen: soft, NT, ND  Musculoskeletal: no cyanosis or clubbing   Data Reviewed: Basic Metabolic Panel:  Recent Labs Lab 07/24/14 1500 07/25/14 0453 07/27/14 0718  NA 149* 154* 151*  K 4.3 4.3 4.3  CL 110 115* 114*  CO2 23 20 23   GLUCOSE 100* 71 120*  BUN 42* 38* 26*  CREATININE 1.44* 1.35 1.24  CALCIUM 9.7 9.3 9.2  MG 2.4  --   --   PHOS 3.1 3.4  --    Liver Function Tests:  Recent Labs Lab 07/24/14 1500 07/25/14 0453  AST 29 27  ALT 15 14  ALKPHOS 112 101  BILITOT 1.0 0.9  PROT 6.1 5.7*  ALBUMIN 3.5 3.2*   No results for input(s): LIPASE, AMYLASE in the last 168 hours. No results for input(s): AMMONIA in the last 168 hours. CBC:  Recent Labs Lab 07/25/14 0453  WBC 5.9  HGB 13.0  HCT 40.6  MCV 95.3  PLT 152   Cardiac Enzymes: No results for input(s): CKTOTAL, CKMB, CKMBINDEX, TROPONINI in the last 168 hours. BNP (last 3 results)  Recent Labs  12/27/13 0950 05/20/14 1728 07/20/14 1130  PROBNP 347.2 283.4 383.1   CBG: No results for input(s): GLUCAP in the last 168 hours.  Recent Results (from the past 240 hour(s))  Body fluid culture     Status: None   Collection Time: 07/21/14  9:56 AM  Result Value Ref Range Status  Specimen Description PLEURAL RIGHT  Final   Special Requests NONE  Final   Gram Stain   Final    RARE WBC PRESENT,BOTH PMN AND MONONUCLEAR NO ORGANISMS SEEN Performed at [facility]    Culture   Final    NO GROWTH 3 DAYS Performed at [facility]    Report Status 07/24/2014 FINAL  Final     Studies: Ct Chest W Contrast  07/25/2014   CLINICAL DATA:  Further evaluation of right lung base density  EXAM: CT CHEST WITH CONTRAST  TECHNIQUE: Multidetector CT imaging of the chest was performed during intravenous contrast administration.  CONTRAST:  80mL OMNIPAQUE IOHEXOL 300 MG/ML  SOLN  COMPARISON:  Chest radiograph 07/24/2014   FINDINGS: Ppm there is a large right pleural effusion, larger than what would be suspected based upon the chest radiograph. As a result, there is extensive right lung compressive atelectasis.  The aerated portion of the right lung is clear except for discoid atelectasis. There is mild atelectasis posteriorly at the left lung base. In the otherwise clear.  Minimal calcification of the thoracic aorta. Heavy coronary artery calcification. No significant pericardial effusion. Trace pericardial fluid noted.  Images to the upper abdomen show a nodular contour to the liver suggesting possibility of cirrhosis. Left renal cyst partially visualized.  Severe acute appearing L1 compression deformity.  IMPRESSION: 1. Large right pleural effusion 2. Severe acute appearing L1 compression deformity 3. Appearance of the liver suggests cirrhosis   Electronically Signed   By: Esperanza Heir M.D.   On: 07/25/2014 21:06   Ct Abdomen Pelvis W Contrast  07/26/2014   CLINICAL DATA:  Patient with history of weight loss and weakness. Evaluate for occult neoplasm.  EXAM: CT ABDOMEN AND PELVIS WITH CONTRAST  TECHNIQUE: Multidetector CT imaging of the abdomen and pelvis was performed using the standard protocol following bolus administration of intravenous contrast.  CONTRAST:  OMNIPAQUE IOHEXOL 300 MG/ML  SOLN  COMPARISON:  Chest CT 07/25/2014; abdominal ultrasound 07/21/2014.  FINDINGS: Visualization of the lower thorax demonstrates normal heart size. Extensive coronary arterial vascular calcifications. No pericardial effusion. There is a moderate to large right pleural effusion. There is associated atelectasis of the right middle and lower lobes. Persistent consolidative opacities within the left lower lobe.  Mild nodular contour to the liver. Sub cm low-attenuation lesion within the hepatic dome (image 19; series 4, too small to accurately characterize. Status post cholecystectomy. Spleen is unremarkable.  There are multiple  low-attenuation cystic lesions throughout pancreas involving the head, body and tail. The largest lesion within the pancreatic mid body measures 2 cm. This is increased in size when compared to prior evaluations. Unchanged calcifications within the region of the pancreatic head.  Normal bilateral adrenal glands.  Multiple bilateral low-attenuation renal lesions, too small to accurately characterize. Note is made of a simple cyst off the inferior pole of the right kidney measuring 1.5 cm and a simple cyst in the interpolar region of the left kidney measuring 2.4 cm.  Additionally, off the superior pole of the left kidney there is a 1.6 cm renal lesion with an internal density of 60 Hounsfield units (image 78; series 7).  Normal caliber abdominal aorta. Multiple upper abdominal collateral vessels are demonstrated, likely secondary to portal venous hypertension. No retroperitoneal lymphadenopathy. Small fat containing periumbilical hernia. No retroperitoneal lymphadenopathy. Nonspecific soft tissue density material within the right inguinal region.  Streak artifact limits evaluation of the pelvis. Urinary bladder is distended. Sigmoid colonic diverticulosis without  CT evidence for acute diverticulitis. Normal appendix. No evidence for bowel obstruction. No free fluid or free intraperitoneal air.  Postsurgical change within the proximal femurs bilaterally. Extensive lower lumbar spine degenerative change. L1 and L2 vertebral body compression deformities. No aggressive or acute appearing osseous lesions.  IMPRESSION: 1. Hyper attenuating lesion off the superior aspect of the left kidney may represent a solid renal mass or hemorrhagic cyst. This needs dedicated evaluation with either pre and postcontrast CT enhanced imaging or MRI. While the lesion may have potentially been present on prior examinations, the lack of coronal and sagittal imaging reformats and the location of the lesion, may comparison difficult. 2. Large  right pleural effusion. Consolidative opacities left lower lobe favored to represent atelectasis. Infection could have a similar appearance. Recommend continued radiographic followup to ensure resolution. 3. Nodular contour of the liver with multiple upper abdominal portal venous collateral vessels, suggestive of cirrhosis and portal venous hypertension. 4. Multiple cystic lesions within the pancreas, largest in the midbody measuring 2 cm, increased in size from prior exam 11/11/2009. In the non-acute setting, recommend further characterization with MRI. 5. Nonspecific soft tissue within the right inguinal region, recommend clinical correlation for the possibility of retracted testicle. 6. Re- demonstrated marked compression deformity of the L1 and L2 vertebral bodies. While they are age-indeterminate, the L1 vertebral compression deformity may be acute. These results will be called to the ordering clinician or representative by the Radiologist Assistant, and communication documented in the PACS or zVision Dashboard.   Electronically Signed   By: Annia Beltrew  Davis M.D.   On: 07/26/2014 14:36    Scheduled Meds: . antiseptic oral rinse  7 mL Mouth Rinse q12n4p  . chlorhexidine  15 mL Mouth Rinse BID   Continuous Infusions: . dextrose 5 % and 0.45 % NaCl with KCl 20 mEq/L 90 mL/hr at 07/27/14 1348     Time spent: > 35 minutes    [name], Jakyra Kenealy  [facility] Pager [phone] If 7PM-7AM, please contact night-coverage at [email or web address], password [email or web address] 07/27/2014, 5:20 PM  LOS: 3 days

## 2014-07-27 NOTE — Op Note (Signed)
[facility] [address] [place], [id number]   ENDOSCOPY PROCEDURE REPORT  PATIENT: Jordan GallusBramblett, [name]  MR#: [id number] BIRTHDATE: Apr 14, 1925 , 89  yrs. old GENDER: male ENDOSCOPIST: Wandalee FerdinandSam Markea Ruzich, MD REFERRED BY: PROCEDURE DATE:  07/27/2014 PROCEDURE:  EGD ASA CLASS:     3 INDICATIONS:  dysphagia MEDICATIONS: fentanyl 20 g IV and Versed 1 mg IV TOPICAL ANESTHETIC: Cetacaine spray  DESCRIPTION OF PROCEDURE: After the risks benefits and alternatives of the procedure were thoroughly explained, informed consent was obtained.  The Pentax Gastroscope [id number] endoscope was introduced through the mouth and advanced to the second portion of the duodenum , Without limitations.  The instrument was slowly withdrawn as the mucosa was fully examined.  Findings:  Esophagus: Normal. No evidence of stricture.  Stomach: Mild erythematous mucosa otherwise normal.  Duodenum: Normal    The scope was then withdrawn from the patient and the procedure completed.  COMPLICATIONS: There were no immediate complications.  ENDOSCOPIC IMPRESSION: mild gastric erythema otherwise normal endoscopy. There is nothing on this exam to explain dysphagia. I suspect that he has an oral pharyngeal dysphagia.   RECOMMENDATIONS:continue observation. I see nothing that we can do to improve this from a GI standpoint. We will sign off. Call us if needed.   REPEAT EXAM:  eSignedWandalee Ferdinand:  Sam Harrington Jobe, MD 07/27/2014 10:22 AM    CC:  CPT CODES: ICD CODES:  The ICD and CPT codes recommended by this software are interpretations from the data that the clinical staff has captured with the software.  The verification of the translation of this report to the ICD and CPT codes and modifiers is the sole responsibility of the health care institution and practicing physician where this report was generated.  [employer] will not be held responsible for the validity of the ICD  and CPT codes included on this report.  AMA assumes no liability for data contained or not contained herein. CPT is a Publishing rights managerregistered trademark of the [employer].

## 2014-07-28 ENCOUNTER — Inpatient Hospital Stay (HOSPITAL_COMMUNITY): Payer: Medicare Other

## 2014-07-28 ENCOUNTER — Ambulatory Visit: Payer: Medicare Other | Admitting: Neurology

## 2014-07-28 ENCOUNTER — Encounter (HOSPITAL_COMMUNITY): Payer: Self-pay | Admitting: Gastroenterology

## 2014-07-28 DIAGNOSIS — R531 Weakness: Secondary | ICD-10-CM | POA: Insufficient documentation

## 2014-07-28 LAB — BASIC METABOLIC PANEL
ANION GAP: 11 (ref 5–15)
BUN: 17 mg/dL (ref 6–23)
CO2: 21 meq/L (ref 19–32)
Calcium: 9 mg/dL (ref 8.4–10.5)
Chloride: 113 mEq/L — ABNORMAL HIGH (ref 96–112)
Creatinine, Ser: 1 mg/dL (ref 0.50–1.35)
GFR calc Af Amer: 76 mL/min — ABNORMAL LOW (ref >90)
GFR calc non Af Amer: 65 mL/min — ABNORMAL LOW (ref >90)
GLUCOSE: 147 mg/dL — AB (ref 70–99)
POTASSIUM: 4.3 meq/L (ref 3.7–5.3)
SODIUM: 145 meq/L (ref 137–147)

## 2014-07-28 MED ORDER — GADOBENATE DIMEGLUMINE 529 MG/ML IV SOLN
10.0000 mL | Freq: Once | INTRAVENOUS | Status: AC
  Administered 2014-07-28: 10 mL via INTRAVENOUS

## 2014-07-28 NOTE — Progress Notes (Signed)
[facility] PROGRESS NOTE  [name] ZOX:[id number] DOB: 1925/06/25 DOA: 07/24/2014 PCP: Jordan SellerEUBANKS, JESSICA K, NP  Assessment/Plan:  Principal Problem:   Dysphagia - Etiology uncertain. MRI negative for stroke - Neurology on board and considering other etiology and currently working up for myesthenia gravis - Pending results may have to consider feeding tube. Discussed with family - GI evaluation complete. Pt is s/p EGD with no etiological source identified. - obtained CT of chest/abdomen/pelvis looking for occult neoplasm that may be contributing to patient's symptoms. Currently reports cysts requiring further work up in pancrease and kidney.  Will place order for MRI of abdomen negative. - Place NG tube, RD consult for nutrition supplementation after ng tube placed.  Active Problems:   Diastolic CHF, acute - compensated currently    Constipation - no new complaints    CKD (chronic kidney disease) stage 3, GFR 30-59 ml/min - stable - Serum creatinine on last check at 1.0    BPH (benign prostatic hyperplasia) - stable, S creatinine trending down    Severe malnutrition - 2ary to principle problem, currently undergoing work up - Consideration for peg tube in place pending how patient's does clinically. - NG tube will be placed in the meantime  Code Status: DNR Family Communication: Discussed with daughter and spouse at bedside Disposition Plan: Pending results of work up.   Consultants:  Neurology  Gastroenterology  Procedures:  MRI/CT of brain and head  Carotid doppler  Echocardiogram  EGD  Antibiotics:  None  HPI/Subjective: Pt has no new complaints. No acute issues reported overnight.  Objective: Filed Vitals:   07/28/14 1213  BP: 121/77  Pulse: 77  Temp: 97.5 F (36.4 C)  Resp: 17    Intake/Output Summary (Last 24 hours) at 07/28/14 1636 Last data filed at 07/28/14 0500  Gross per 24 hour  Intake   1417 ml  Output     75 ml   Net   1342 ml   Filed Weights   07/25/14 0040  Weight: 51.256 kg (113 lb)    Exam:   General:  Pt in nad, alert and awake  Cardiovascular: rrr, no mrg  Respiratory: cta bl, no wheezes  Abdomen: soft, NT, ND  Musculoskeletal: no cyanosis or clubbing   Data Reviewed: Basic Metabolic Panel:  Recent Labs Lab 07/24/14 1500 07/25/14 0453 07/27/14 0718 07/28/14 0805  NA 149* 154* 151* 145  Horn 4.3 4.3 4.3 4.3  CL 110 115* 114* 113*  CO2 23 20 23 21   GLUCOSE 100* 71 120* 147*  BUN 42* 38* 26* 17  CREATININE 1.44* 1.35 1.24 1.00  CALCIUM 9.7 9.3 9.2 9.0  MG 2.4  --   --   --   PHOS 3.1 3.4  --   --    Liver Function Tests:  Recent Labs Lab 07/24/14 1500 07/25/14 0453  AST 29 27  ALT 15 14  ALKPHOS 112 101  BILITOT 1.0 0.9  PROT 6.1 5.7*  ALBUMIN 3.5 3.2*   No results for input(s): LIPASE, AMYLASE in the last 168 hours. No results for input(s): AMMONIA in the last 168 hours. CBC:  Recent Labs Lab 07/25/14 0453  WBC 5.9  HGB 13.0  HCT 40.6  MCV 95.3  PLT 152   Cardiac Enzymes: No results for input(s): CKTOTAL, CKMB, CKMBINDEX, TROPONINI in the last 168 hours. BNP (last 3 results)  Recent Labs  12/27/13 0950 05/20/14 1728 07/20/14 1130  PROBNP 347.2 283.4 383.1   CBG: No results for input(s): GLUCAP  in the last 168 hours.  Recent Results (from the past 240 hour(s))  Body fluid culture     Status: None   Collection Time: 07/21/14  9:56 AM  Result Value Ref Range Status   Specimen Description PLEURAL RIGHT  Final   Special Requests NONE  Final   Gram Stain   Final    RARE WBC PRESENT,BOTH PMN AND MONONUCLEAR NO ORGANISMS SEEN Performed at [facility]    Culture   Final    NO GROWTH 3 DAYS Performed at [facility]    Report Status 07/24/2014 FINAL  Final     Studies: Mr Abdomen W Wo Contrast  07/28/2014   CLINICAL DATA:  Evaluate pancreatic cysts and renal lesions demonstrated on recent CT scan.  EXAM: MRI ABDOMEN  WITHOUT AND WITH CONTRAST  TECHNIQUE: Multiplanar multisequence MR imaging of the abdomen was performed both before and after the administration of intravenous contrast.  CONTRAST:  10 cc MultiHance  COMPARISON:  CT scan 07/26/2014  FINDINGS: Examination is somewhat limited by breathing motion artifact.  Small simple appearing hepatic cysts are noted. There is a tortuous vessel in the right hepatic lobe near the dome which is most likely a vascular shunt. The portal and hepatic veins are patent. No worrisome hepatic lesions. The liver contour is abnormal and there are portal venous collaterals, mainly perisplenic collaterals which may suggest cirrhosis and portal hypertension. No splenomegaly or ascites.  There is a large right pleural effusion as demonstrated on the CT scan.  Multiple cystic lesions are noted in the pancreas. These are most likely benign cysts or pseudocysts. I do not see any worrisome enhancement. Small side-branch IPMNs are possible.  There are numerous small renal cysts. There are also several proteinaceous/ hemorrhagic cysts. The lesion involving the upper pole of the left kidney is a hemorrhagic cyst. No worrisome renal lesions. No hydronephrosis.  No mesenteric or retroperitoneal mass or adenopathy. Atherosclerotic changes involving aorta are minimal for age. No significant bony findings.  IMPRESSION: 1. Probable mild cirrhotic changes involving the liver with portal venous hypertension and portal venous collaterals. No splenomegaly or ascites. 2. Small pancreatic cysts are likely benign. 3. Small simple cysts and hemorrhagic/proteinaceous renal cysts. No worrisome renal lesions. 4. Given the patient's age and the MR imaging findings I do not think any further imaging evaluation or follow-up is indicated.   Electronically Signed   By: [name] M.D.   On: 07/28/2014 12:24    Scheduled Meds: . antiseptic oral rinse  7 mL Mouth Rinse q12n4p  . chlorhexidine  15 mL Mouth Rinse BID    Continuous Infusions: . dextrose 5 % and 0.45 % NaCl with KCl 20 mEq/L 90 mL/hr at 07/28/14 1536     Time spent: > 35 minutes    [name], [name]  [facility] Pager [phone] If 7PM-7AM, please contact night-coverage at [email or web address], password [email or web address] 07/28/2014, 4:36 PM  LOS: 4 days

## 2014-07-28 NOTE — Evaluation (Signed)
Clinical/Bedside Swallow Evaluation Patient Details  Name: [name] MRN: [id number] Date of Birth: [birth year]  Today's Date: 07/28/2014 Time: [id number] SLP Time Calculation (min) (ACUTE ONLY): 30 min  Past Medical History:  Past Medical History  Diagnosis Date  . Gout   . Insomnia   . BPH (benign prostatic hyperplasia)   . Kidney disorder   . Arthritis   . Hypertension     patient denies on 10/25/12 on no meds   . Complication of anesthesia     hard to wake up -2011 after hernia surgery   . Anemia   . CHF (congestive heart failure)   . Prostate disorder   . Hernia, abdominal    Past Surgical History:  Past Surgical History  Procedure Laterality Date  . Cataract extraction    . Cholecystectomy    . Hernia repair  02/22/11    right  . Joint replacement      fractured hip  . Total hip arthroplasty Right 10/29/2012    Procedure: CONVERSION OF PREVIOUS HIP SURGERY TO RIGHT TOTAL HIP AND REMOVAL OF IM NAIL ;  Surgeon: Shelda PalMatthew D Olin, MD;  Location: [facility];  Service: Orthopedics;  Laterality: Right;  . Esophagogastroduodenoscopy N/A 11/16/2012    Procedure: ESOPHAGOGASTRODUODENOSCOPY (EGD);  Surgeon: Theda BelfastPatrick D Hung, MD;  [facility];  Service: Endoscopy;  Laterality: N/A;  . Intramedullary (im) nail intertrochanteric Left 10/17/2013    Procedure: INTRAMEDULLARY (IM) NAIL INTERTROCHANTRIC;  Surgeon: Shelda PalMatthew D Olin, MD;  Location: [facility];  Service: Orthopedics;  Laterality: Left;  . Esophagogastroduodenoscopy N/A 07/27/2014    Procedure: ESOPHAGOGASTRODUODENOSCOPY (EGD);  Surgeon: Graylin ShiverSalem F Ganem, MD;  [facility];  Service: Endoscopy;  Laterality: N/A;   HPI:  [name] is a 80 y.o. male, with a pmh of recurrent exudative effusion (which will not be worked up per patient and patient's family), CHF, hip replacement x 2 and BPH. He [facility] as [facility] for possible PEG tube placement. [name] developed dysphonia "fat  tongued speech" on thanksgiving when he choked on iced tea. His symptoms worsened again on Saturday 11/28 when he again choked on liquids. At this point he is unable to take medications, eat solids or liquids without choking. Per daughter, this has happened to [name] several times in the past - when he has developed a UTI or when his pleural effusion has become large and needs to be drained. Each time the dysphonia and dysphagia improved when the patient was treated for the underlying problem. [name] underwent Speech Eval outpatient 12/2. MBS results showed a high risk for aspiration with moderate oral phase dysphagia; severe pharyngeal phase dysphagia; severe cervical esophageal phase dysphagia.  EGD 10/21/2012 completed by Dr Elnoria HowardHung showed 2 cm hiatal hernia, mild esophagitis and Schtazki's ring. EGD complete 12/6 negative. W/u underway for myesthenia gravis. MRI negative for CVA.    Assessment / Plan / Recommendation Clinical Impression  Swallow evaluated at bedside with results appearing relatively consistent with MBS results from 12/2 which indicated a moderate oral and severe pharyngoesophageal based dysphagia. Patient reports mild improvement from 12/2 MBS however not near baseline. Dysphonia also improved with current vocal quality remaining clear.  At this time, with myesthenia gravis panel still pending, would recommend temporary non-oral means of nutrition with decisions regarding further treatment and/or non-oral means of nutrition pending results as origin of dysphagia remains unclear. Education complete including in depth discussion of results and recommended plan. Patient and family are  in agreement. SLP will f/u.    Aspiration Risk  Severe    Diet Recommendation NPO;Alternative means - temporary;Ice chips PRN after oral care   Medication Administration: Via alternative means    Other  Recommendations Oral Care Recommendations: Oral care prior to ice chips (QID)   Follow  Up Recommendations       Frequency and Duration min 3x week  2 weeks   Pertinent Vitals/Pain n/a        Swallow Study    General HPI: Jordan Rimarl Fusco is a 80 y.o. male, with a pmh of recurrent exudative effusion (which will not be worked up per patient and patient's family), CHF, hip replacement x 2 and BPH. He was [facility] as a [facility] for possible PEG tube placement. [name] developed dysphonia "fat tongued speech" on thanksgiving when he choked on iced tea. His symptoms worsened again on Saturday 11/28 when he again choked on liquids. At this point he is unable to take medications, eat solids or liquids without choking. Per daughter, this has happened to [name] several times in the past - when he has developed a UTI or when his pleural effusion has become large and needs to be drained. Each time the dysphonia and dysphagia improved when the patient was treated for the underlying problem. [name] underwent Speech Eval outpatient 12/2. MBS results showed a high risk for aspiration with moderate oral phase dysphagia; severe pharyngeal phase dysphagia; severe cervical esophageal phase dysphagia.  EGD 10/21/2012 completed by Dr Elnoria HowardHung showed 2 cm hiatal hernia, mild esophagitis and Schtazki's ring. EGD complete 12/6 negative. W/u underway for myesthenia gravis. MRI negative for CVA.  Type of Study: Bedside swallow evaluation Previous Swallow Assessment: see HPI Diet Prior to this Study: NPO Temperature Spikes Noted: No Respiratory Status: Room air History of Recent Intubation: No Behavior/Cognition: Alert;Cooperative;Pleasant mood Oral Cavity - Dentition: Adequate natural dentition Self-Feeding Abilities: Able to feed self Patient Positioning: Upright in bed Baseline Vocal Quality: Clear Volitional Cough: Strong Volitional Swallow: Able to elicit    Oral/Motor/Sensory Function Overall Oral Motor/Sensory Function: Appears within functional  limits for tasks assessed   Ice Chips Ice chips: Impaired Presentation: Spoon;Self Fed Pharyngeal Phase Impairments: Decreased hyoid-laryngeal movement;Throat Clearing - Delayed;Cough - Delayed (multiple swallows)   Thin Liquid Thin Liquid: Impaired Presentation: Cup;Self Fed Pharyngeal  Phase Impairments: Decreased hyoid-laryngeal movement;Multiple swallows;Throat Clearing - Delayed;Cough - Delayed    Nectar Thick Nectar Thick Liquid: Not tested   Honey Thick Honey Thick Liquid: Not tested   Puree Puree: Not tested   Solid   GO   Melvin Whiteford MA, CCC-SLP [phone]  Solid: Not tested       Auden Wettstein Meryl 07/28/2014,3:42 PM

## 2014-07-28 NOTE — Plan of Care (Signed)
Problem: Phase I Progression Outcomes Goal: OOB as tolerated unless otherwise ordered Outcome: Completed/Met Date Met:  07/28/14     

## 2014-07-28 NOTE — Progress Notes (Signed)
PT Cancellation Note  Patient Details Name: [name] MRN: [id number] DOB: 1925-04-16   Cancelled Treatment:    Reason Eval/Treat Not Completed: Patient at procedure or test/unavailable. Pt in MRI.   Ilda FoilGarrow, Peighton Edgin Rene 07/28/2014, 10:36 AM

## 2014-07-28 NOTE — Progress Notes (Signed)
[name] is lying in beds with his spouse and daughter, [name], at bedside. We have not identified a clear etiology to his dysphagia. Family is requesting follow up from SLP (as this has been transient in the past and his dysphonia has improved) before moving forward with PEG placement. I think this is reasonable - consult placed. I believe they would likely want a PEG if his dysphagia continues to be severe - I will continue to follow and discuss.   Yong ChannelAlicia Jermey Closs, NP Palliative Medicine Team Pager # [phone] (M-F 8a-5p) Team Phone # [phone] (Nights/Weekends)

## 2014-07-29 ENCOUNTER — Inpatient Hospital Stay (HOSPITAL_COMMUNITY): Payer: Medicare Other

## 2014-07-29 DIAGNOSIS — E43 Unspecified severe protein-calorie malnutrition: Secondary | ICD-10-CM | POA: Insufficient documentation

## 2014-07-29 LAB — GLUCOSE, CAPILLARY
GLUCOSE-CAPILLARY: 128 mg/dL — AB (ref 70–99)
GLUCOSE-CAPILLARY: 140 mg/dL — AB (ref 70–99)
Glucose-Capillary: 146 mg/dL — ABNORMAL HIGH (ref 70–99)

## 2014-07-29 LAB — ACETYLCHOLINE RECEPTOR, BINDING: Acetylcholine Receptor Ab: 17.2 nmol/L — ABNORMAL HIGH (ref <=0.30)

## 2014-07-29 MED ORDER — JEVITY 1.2 CAL PO LIQD
1000.0000 mL | ORAL | Status: DC
  Administered 2014-07-29 – 2014-07-31 (×2): 1000 mL
  Filled 2014-07-29 (×5): qty 1000
  Filled 2014-07-29: qty 237
  Filled 2014-07-29: qty 1000

## 2014-07-29 NOTE — Progress Notes (Signed)
[facility] PROGRESS NOTE  CATALDO COSGRIFF ZOX:[id number] DOB: December 15, 1924 DOA: 07/24/2014 PCP: [name], NP  Assessment/Plan:  Principal Problem:   Dysphagia - Etiology uncertain. MRI negative for stroke - Neurology on board and considering other etiology and currently working up for myesthenia gravis - Pending results may have to consider feeding tube. Discussed with family - GI evaluation complete. Pt is s/p EGD with no etiological source identified. - obtained CT of chest/abdomen/pelvis and MRI of abdomen. Work up for occult neoplasm negative - Placed NG tube, RD consult for nutrition supplementation after ng tube placed. Will make npo after midnight.  - Consult IR for peg tube placement.  Active Problems:   Diastolic CHF, acute - compensated currently    Constipation - no new complaints    CKD (chronic kidney disease) stage 3, GFR 30-59 ml/min - stable - Serum creatinine on last check at 1.0    BPH (benign prostatic hyperplasia) - stable, S creatinine trending down    Severe malnutrition - 2ary to principle problem, currently undergoing work up - NG tube will be placed in the meantime - Family ok with placing peg tube  Code Status: DNR Family Communication: Discussed with daughter and spouse at bedside Disposition Plan: Pending results of work up.   Consultants:  Neurology  Gastroenterology  Procedures:  MRI/CT of brain and head  Carotid doppler  Echocardiogram  EGD  Antibiotics:  None  HPI/Subjective: Pt has no new complaints. No acute issues reported overnight.  Objective: Filed Vitals:   07/29/14 0612  BP: 147/79  Pulse: 76  Temp: 97.6 F (36.4 C)  Resp: 18    Intake/Output Summary (Last 24 hours) at 07/29/14 1501 Last data filed at 07/29/14 0900  Gross per 24 hour  Intake      0 ml  Output    850 ml  Net   -850 ml   Filed Weights   07/25/14 0040  Weight: 51.256 kg (113 lb)    Exam:   General:  Pt in nad,  alert and awake  Cardiovascular: rrr, no mrg  Respiratory: cta bl, no wheezes  Abdomen: soft, NT, ND  Musculoskeletal: no cyanosis or clubbing   Data Reviewed: Basic Metabolic Panel:  Recent Labs Lab 07/24/14 1500 07/25/14 0453 07/27/14 0718 07/28/14 0805  NA 149* 154* 151* 145  K 4.3 4.3 4.3 4.3  CL 110 115* 114* 113*  CO2 23 20 23 21   GLUCOSE 100* 71 120* 147*  BUN 42* 38* 26* 17  CREATININE 1.44* 1.35 1.24 1.00  CALCIUM 9.7 9.3 9.2 9.0  MG 2.4  --   --   --   PHOS 3.1 3.4  --   --    Liver Function Tests:  Recent Labs Lab 07/24/14 1500 07/25/14 0453  AST 29 27  ALT 15 14  ALKPHOS 112 101  BILITOT 1.0 0.9  PROT 6.1 5.7*  ALBUMIN 3.5 3.2*   No results for input(s): LIPASE, AMYLASE in the last 168 hours. No results for input(s): AMMONIA in the last 168 hours. CBC:  Recent Labs Lab 07/25/14 0453  WBC 5.9  HGB 13.0  HCT 40.6  MCV 95.3  PLT 152   Cardiac Enzymes: No results for input(s): CKTOTAL, CKMB, CKMBINDEX, TROPONINI in the last 168 hours. BNP (last 3 results)  Recent Labs  12/27/13 0950 05/20/14 1728 07/20/14 1130  PROBNP 347.2 283.4 383.1   CBG: No results for input(s): GLUCAP in the last 168 hours.  Recent Results (from the past  240 hour(s))  Body fluid culture     Status: None   Collection Time: 07/21/14  9:56 AM  Result Value Ref Range Status   Specimen Description PLEURAL RIGHT  Final   Special Requests NONE  Final   Gram Stain   Final    RARE WBC PRESENT,BOTH PMN AND MONONUCLEAR NO ORGANISMS SEEN Performed at [facility]    Culture   Final    NO GROWTH 3 DAYS Performed at [facility]    Report Status 07/24/2014 FINAL  Final     Studies: Dg Abd 1 View  07/29/2014   CLINICAL DATA:  80 year old for an enteric tube evaluation  EXAM: ABDOMEN - 1 VIEW  COMPARISON:  None.  FINDINGS: A weighted enteric tube tip projects over the expected position of the gastric fundus. Metallic clips project over the right  upper quadrant. The bowel gas pattern is nonobstructive. Contrast material is noted within the rectum. A right hip replacement and a left intramedullary rod fixation device traverses the left femur.  Multilevel degenerative changes of the spine are present. There is leftward lumbar scoliosis.  IMPRESSION: Weighted enteric tube with tip projecting over the expected position of the gastric fundus.   Electronically Signed   By: Fannie KneeKenneth  Crosby   On: 07/29/2014 13:37   Mr Abdomen W Wo Contrast  07/28/2014   CLINICAL DATA:  Evaluate pancreatic cysts and renal lesions demonstrated on recent CT scan.  EXAM: MRI ABDOMEN WITHOUT AND WITH CONTRAST  TECHNIQUE: Multiplanar multisequence MR imaging of the abdomen was performed both before and after the administration of intravenous contrast.  CONTRAST:  10 cc MultiHance  COMPARISON:  CT scan 07/26/2014  FINDINGS: Examination is somewhat limited by breathing motion artifact.  Small simple appearing hepatic cysts are noted. There is a tortuous vessel in the right hepatic lobe near the dome which is most likely a vascular shunt. The portal and hepatic veins are patent. No worrisome hepatic lesions. The liver contour is abnormal and there are portal venous collaterals, mainly perisplenic collaterals which may suggest cirrhosis and portal hypertension. No splenomegaly or ascites.  There is a large right pleural effusion as demonstrated on the CT scan.  Multiple cystic lesions are noted in the pancreas. These are most likely benign cysts or pseudocysts. I do not see any worrisome enhancement. Small side-branch IPMNs are possible.  There are numerous small renal cysts. There are also several proteinaceous/ hemorrhagic cysts. The lesion involving the upper pole of the left kidney is a hemorrhagic cyst. No worrisome renal lesions. No hydronephrosis.  No mesenteric or retroperitoneal mass or adenopathy. Atherosclerotic changes involving aorta are minimal for age. No significant bony  findings.  IMPRESSION: 1. Probable mild cirrhotic changes involving the liver with portal venous hypertension and portal venous collaterals. No splenomegaly or ascites. 2. Small pancreatic cysts are likely benign. 3. Small simple cysts and hemorrhagic/proteinaceous renal cysts. No worrisome renal lesions. 4. Given the patient's age and the MR imaging findings I do not think any further imaging evaluation or follow-up is indicated.   Electronically Signed   By: Loralie ChampagneMark  Gallerani M.D.   On: 07/28/2014 12:24    Scheduled Meds: . antiseptic oral rinse  7 mL Mouth Rinse q12n4p  . chlorhexidine  15 mL Mouth Rinse BID   Continuous Infusions: . dextrose 5 % and 0.45 % NaCl with KCl 20 mEq/L 90 mL/hr at 07/29/14 1343  . feeding supplement (JEVITY 1.2 CAL)       Time spent: >  35 minutes    [name], Tallis Soledad  [facility] Pager [phone] If 7PM-7AM, please contact night-coverage at [email or web address], password [email or web address] 07/29/2014, 3:01 PM  LOS: 5 days

## 2014-07-29 NOTE — Progress Notes (Signed)
INITIAL NUTRITION ASSESSMENT  DOCUMENTATION CODES Per approved criteria  -Severe malnutrition in the context of chronic illness  Pt meets criteria for severe MALNUTRITION in the context of chronic illness as evidenced by severe fat and muscle wasting and reported weight loss of 29% in <1 year.  INTERVENTION: Initiate Jevity 1.2 @ 20 ml/hr via NG tube and increase by 10 ml every 6 hours to goal rate of 55 ml/hr.   Tube feeding regimen provides 1584 kcal (100% of needs), 73 grams of protein, and 1069 ml of H2O.   Recommend flushes of 150 ml QID to provide additional 600 ml of free water  NUTRITION DIAGNOSIS: Inadequate oral intake related to inability to eat as evidenced by NPO.   Goal: Pt to meet >/= 90% of their estimated nutrition needs   Monitor:  Weight trend, TF initiation and tolerance, labs, I/O's, SLP notes  Reason for Assessment: Consult for TF initiation and management  80 y.o. male  Admitting Dx: Dysphagia  ASSESSMENT: 80 y.o. male, with a pmh of recurrent exudative effusion (which will not be worked up per patient and patient's family), CHF, hip replacement x 2 and BPH. He was sent as a direct admit [facility] for possible PEG tube placement. [name] is quite coherent, but has a supportive family and his daughter gives much of the history. [name] developed dysphonia "fat tongued speech" on thanksgiving when he choked on iced tea. His symptoms worsened again on Saturday 11/28 when he again choked on liquids. At this point he is unable to take medications, eat solids or liquids without choking. Per daughter, this has happened to [name] several times in the past - when he has developed a UTI or when his pleural effusion has become large and needs to be drained. Each time the dysphonia and dysphagia improved when the patient was treated for the underlying problem. [name] underwent Speech Eval outpatient 12/2. Results showed a high  risk for aspiration.  - Pt reports his usual body weight as 160 lbs less than 1 year ago. NG tube has not been placed yet. Will order TF so that it can be started once NG tube placed. PEG tube being considered. Will transition pt to bolus feeds if PEG is placed.   Nutrition Focused Physical Exam:  Subcutaneous Fat:  Orbital Region: severe wasting Upper Arm Region: severe wasting Thoracic and Lumbar Region: severe wasting  Muscle:  Temple Region: severe wasting Clavicle Bone Region: severe wasting Clavicle and Acromion Bone Region: severe wasting Scapular Bone Region: severe wasting Dorsal Hand: severe wasting Patellar Region: severe wasting Anterior Thigh Region: severe wasting Posterior Calf Region: severe wasting  Edema: none  Height: Ht Readings from Last 1 Encounters:  07/25/14 5\' 3"  (1.6 m)    Weight: Wt Readings from Last 1 Encounters:  07/25/14 113 lb (51.256 kg)    Ideal Body Weight: 56.9 kg  % Ideal Body Weight: 90%  Wt Readings from Last 10 Encounters:  07/25/14 113 lb (51.256 kg)  07/22/14 113 lb 6.4 oz (51.438 kg)  07/22/14 115 lb 12.8 oz (52.527 kg)  06/12/14 121 lb 9.6 oz (55.157 kg)  05/22/14 122 lb 2.2 oz (55.4 kg)  04/10/14 119 lb (53.978 kg)  03/07/14 117 lb (53.071 kg)  03/03/14 121 lb (54.885 kg)  02/18/14 121 lb (54.885 kg)  12/27/13 126 lb (57.153 kg)    Usual Body Weight: 160 lbs <1 year ago  % Usual Body Weight: 70%  BMI:  Body mass  index is 20.02 kg/(m^2).  Estimated Nutritional Needs: Kcal: 1400-1600 Protein: 65-80 g Fluid: >1.6 L/day  Skin: stage II pressure ulcer on sacrum  Diet Order: Diet NPO time specified Except for: Ice Chips  EDUCATION NEEDS: -Education needs addressed   Intake/Output Summary (Last 24 hours) at 07/29/14 1049 Last data filed at 07/29/14 0500  Gross per 24 hour  Intake      0 ml  Output   1000 ml  Net  -1000 ml    Last BM: 12/8   Labs:   Recent Labs Lab 07/24/14 1500 07/25/14 0453  07/27/14 0718 07/28/14 0805  NA 149* 154* 151* 145  K 4.3 4.3 4.3 4.3  CL 110 115* 114* 113*  CO2 23 20 23 21   BUN 42* 38* 26* 17  CREATININE 1.44* 1.35 1.24 1.00  CALCIUM 9.7 9.3 9.2 9.0  MG 2.4  --   --   --   PHOS 3.1 3.4  --   --   GLUCOSE 100* 71 120* 147*    CBG (last 3)  No results for input(s): GLUCAP in the last 72 hours.  Scheduled Meds: . antiseptic oral rinse  7 mL Mouth Rinse q12n4p  . chlorhexidine  15 mL Mouth Rinse BID    Continuous Infusions: . dextrose 5 % and 0.45 % NaCl with KCl 20 mEq/L 90 mL/hr at 07/29/14 0234    Past Medical History  Diagnosis Date  . Gout   . Insomnia   . BPH (benign prostatic hyperplasia)   . Kidney disorder   . Arthritis   . Hypertension     patient denies on 10/25/12 on no meds   . Complication of anesthesia     hard to wake up -2011 after hernia surgery   . Anemia   . CHF (congestive heart failure)   . Prostate disorder   . Hernia, abdominal     Past Surgical History  Procedure Laterality Date  . Cataract extraction    . Cholecystectomy    . Hernia repair  02/22/11    right  . Joint replacement      fractured hip  . Total hip arthroplasty Right 10/29/2012    Procedure: CONVERSION OF PREVIOUS HIP SURGERY TO RIGHT TOTAL HIP AND REMOVAL OF IM NAIL ;  Surgeon: Shelda PalMatthew D Olin, MD;  Location: [facility];  Service: Orthopedics;  Laterality: Right;  . Esophagogastroduodenoscopy N/A 11/16/2012    Procedure: ESOPHAGOGASTRODUODENOSCOPY (EGD);  Surgeon: Theda BelfastPatrick D Hung, MD;  [facility];  Service: Endoscopy;  Laterality: N/A;  . Intramedullary (im) nail intertrochanteric Left 10/17/2013    Procedure: INTRAMEDULLARY (IM) NAIL INTERTROCHANTRIC;  Surgeon: Shelda PalMatthew D Olin, MD;  Location: [facility];  Service: Orthopedics;  Laterality: Left;  . Esophagogastroduodenoscopy N/A 07/27/2014    Procedure: ESOPHAGOGASTRODUODENOSCOPY (EGD);  Surgeon: Graylin ShiverSalem F Ganem, MD;  [facility];  Service: Endoscopy;  Laterality: N/A;    Emmaline KluverHaley  Ryelynn Guedea MS, RD, LDN

## 2014-07-29 NOTE — Progress Notes (Signed)
Physical Therapy Treatment Patient Details Name: [name] MRN: [id number] DOB: [birth year] Today's Date: 07/29/2014    History of Present Illness Pt admit with dysphagia and stroke workup that has been negative thus far.  Plan to be placement of NG tube for nutrition.    PT Comments    Pt appears to be coming to terms with plan for placement of NG tube. He expressed his concern and hesitation and emotional support was provided to pt for which he expressed gratitude. Focused session on overall activity tolerance and functional mobility training with RW. Pt demonstrating impairments in activity tolerance, balance, and overall deconditioning. Pt will continue to benefit from PT services to address these impairments and increased functional independence with plan for home with family to provide Supervision/assist as needed.    Follow Up Recommendations  Home health PT;Supervision/Assistance - 24 hour     Equipment Recommendations  None recommended by PT    Recommendations for Other Services       Precautions / Restrictions Precautions Precautions: Fall Restrictions Weight Bearing Restrictions: No    Mobility  Bed Mobility               General bed mobility comments: Pt already up in recliner.  Transfers Overall transfer level: Needs assistance Equipment used: Rolling walker (2 wheeled) Transfers: Sit to/from Stand Sit to Stand: Min assist         General transfer comment: Min A for sit to stand part of transfer and close S/steady A to maitnain balance in standing for functional tasks.  Ambulation/Gait Ambulation/Gait assistance: Min guard Ambulation Distance (Feet): 100 Feet Assistive device: Rolling walker (2 wheeled) Gait Pattern/deviations: Step-through pattern;Trunk flexed;Decreased stride length     General Gait Details: Cues for posture intermittently.   Stairs            Wheelchair Mobility    Modified Rankin (Stroke Patients Only)        Balance     Sitting balance-Leahy Scale: Good       Standing balance-Leahy Scale: Poor Standing balance comment: Required UE support in standing and steady A for balance                    Cognition Arousal/Alertness: Awake/alert Behavior During Therapy: WFL for tasks assessed/performed Overall Cognitive Status: Within Functional Limits for tasks assessed                      Exercises General Exercises - Lower Extremity Long Arc Quad: Strengthening;Both;10 reps Hip Flexion/Marching: Seated;Standing;10 reps;Strengthening;Both (1 set of 10 reps each exercise BLE (seated and standing))    General Comments General comments (skin integrity, edema, etc.): Pt expressed concerns and hesitation in regards to NG tube. Emotional support provided to pt as able. He states he is better spirits today about things than he has been lately. Eager for his family to return this afternoon.       Pertinent Vitals/Pain Pain Assessment: No/denies pain    Home Living                      Prior Function            PT Goals (current goals can now be found in the care plan section) Acute Rehab PT Goals Patient Stated Goal: to gohome PT Goal Formulation: With patient Time For Goal Achievement: 08/08/14 Potential to Achieve Goals: Good Progress towards PT goals: Progressing toward goals    Frequency  Min 3X/week    PT Plan Current plan remains appropriate    Co-evaluation             End of Session Equipment Utilized During Treatment: Gait belt Activity Tolerance: Patient limited by fatigue Patient left: in chair;with call bell/phone within reach     Time: 1040-1110 PT Time Calculation (min) (ACUTE ONLY): 30 min  Charges:  $Therapeutic Exercise: 8-22 mins $Therapeutic Activity: 8-22 mins                    G Codes:      Tedd SiasGray, Janica Eldred Brescia 07/29/2014, 11:41 AM

## 2014-07-29 NOTE — Consult Note (Signed)
Chief Complaint: Dysphagia Aspiration  Referring Physician(s): [facility]  History of Present Illness: [name] is a 80 y.o. male  Effusion- reccurent Choking at home after Thanksgiving dinner "losing voice"; dysphonia MBS severe dysphagia; aspiration high risk Malnutrition Work up with myasthenia gravis Request for percutaneous gastric tube placement per [facility] Dr Deanne CofferHassell has reviewed imaging and feels anatomy is amenable to placement Now scheduled for same 12/9  Past Medical History  Diagnosis Date  . Gout   . Insomnia   . BPH (benign prostatic hyperplasia)   . Kidney disorder   . Arthritis   . Hypertension     patient denies on 10/25/12 on no meds   . Complication of anesthesia     hard to wake up -2011 after hernia surgery   . Anemia   . CHF (congestive heart failure)   . Prostate disorder   . Hernia, abdominal     Past Surgical History  Procedure Laterality Date  . Cataract extraction    . Cholecystectomy    . Hernia repair  02/22/11    right  . Joint replacement      fractured hip  . Total hip arthroplasty Right 10/29/2012    Procedure: CONVERSION OF PREVIOUS HIP SURGERY TO RIGHT TOTAL HIP AND REMOVAL OF IM NAIL ;  Surgeon: Shelda PalMatthew D Olin, MD;  Location: [facility];  Service: Orthopedics;  Laterality: Right;  . Esophagogastroduodenoscopy N/A 11/16/2012    Procedure: ESOPHAGOGASTRODUODENOSCOPY (EGD);  Surgeon: Theda BelfastPatrick D Hung, MD;  [facility];  Service: Endoscopy;  Laterality: N/A;  . Intramedullary (im) nail intertrochanteric Left 10/17/2013    Procedure: INTRAMEDULLARY (IM) NAIL INTERTROCHANTRIC;  Surgeon: Shelda PalMatthew D Olin, MD;  Location: [facility];  Service: Orthopedics;  Laterality: Left;  . Esophagogastroduodenoscopy N/A 07/27/2014    Procedure: ESOPHAGOGASTRODUODENOSCOPY (EGD);  Surgeon: Graylin ShiverSalem F Ganem, MD;  [facility];  Service: Endoscopy;  Laterality: N/A;    Allergies: Sertraline and Nsaids  Medications: Prior to Admission medications     Medication Sig Start Date End Date Taking? Authorizing Provider  acetaminophen (TYLENOL) 500 MG tablet Take 1,000 mg by mouth every 6 (six) hours as needed for mild pain.   Yes Historical Provider, MD  allopurinol (ZYLOPRIM) 100 MG tablet Take 100 mg by mouth daily.   Yes Historical Provider, MD  lactose free nutrition (BOOST PLUS) LIQD Take 237 mLs by mouth 2 (two) times daily between meals. 05/22/14  Yes Catarina Hartshornavid Tat, MD  naphazoline-glycerin (CLEAR EYES) 0.012-0.2 % SOLN Place 1-2 drops into both eyes every 4 (four) hours as needed for irritation.   Yes Historical Provider, MD  Polyethyl Glycol-Propyl Glycol (SYSTANE) 0.4-0.3 % GEL Apply 1 application to eye as needed (dry/watery eyes).   Yes Historical Provider, MD  silodosin (RAPAFLO) 8 MG CAPS capsule Take 8 mg by mouth daily with breakfast.   Yes Historical Provider, MD    Family History  Problem Relation Age of Onset  . Heart failure Mother     heart  . Heart failure Father     fell and hit head  . Cancer Brother     colon  . Diabetes Daughter     History   Social History  . Marital Status: Married    Spouse Name: N/A    Number of Children: N/A  . Years of Education: N/A   Social History Main Topics  . Smoking status: Never Smoker   . Smokeless tobacco: Never Used  . Alcohol Use: No  . Drug Use: No  .  Sexual Activity: No   Other Topics Concern  . None   Social History Narrative   Worked for [employer]   Also did map drawing    Review of Systems: A 12 point ROS discussed and pertinent positives are indicated in the HPI above.  All other systems are negative.  Review of Systems  Constitutional: Positive for activity change, appetite change and fatigue. Negative for fever.  Respiratory: Positive for cough, choking and shortness of breath.   Cardiovascular: Negative for chest pain.  Neurological: Positive for speech difficulty and weakness.  Psychiatric/Behavioral: Negative for behavioral problems and confusion.     Vital Signs: BP 147/79 mmHg  Pulse 76  Temp(Src) 97.6 F (36.4 C) (Oral)  Resp 18  Ht 5\' 3"  (1.6 m)  Wt 51.256 kg (113 lb)  BMI 20.02 kg/m2  SpO2 96%  Physical Exam  Constitutional: He is oriented to person, place, and time.  Cardiovascular: Normal rate and regular rhythm.   No murmur heard. Pulmonary/Chest: Effort normal and breath sounds normal. He has no wheezes.  Abdominal: Soft. Bowel sounds are normal.  Musculoskeletal: Normal range of motion.  Neurological: He is alert and oriented to person, place, and time.  Skin: Skin is warm and dry.  Psychiatric: He has a normal mood and affect. His behavior is normal. Judgment and thought content normal.  Nursing note and vitals reviewed.   Imaging: Dg Chest 1 View  07/21/2014   CLINICAL DATA:  Post RIGHT thoracentesis  EXAM: CHEST - 1 VIEW  COMPARISON:  07/20/2014  FINDINGS: Decrease in RIGHT pleural effusion post thoracentesis.  No pneumothorax.  Persistent RIGHT basilar atelectasis.  LEFT lung clear.  Bones demineralized.  IMPRESSION: Decreased RIGHT pleural effusion and basilar atelectasis post thoracentesis without pneumothorax.   Electronically Signed   By: Ulyses Southward M.D.   On: 07/21/2014 10:24   Dg Chest 2 View  07/24/2014   CLINICAL DATA:  Cough for 1 week  EXAM: CHEST  2 VIEW  COMPARISON:  07/21/2014  FINDINGS: Right pleural effusion improved. Persistent opacity at the right lung base. Left lung is under aerated with mild basilar atelectasis. Skin fold along the left lateral hemi thorax at the costophrenic angle is suspected and not significantly changed. Normal heart size.  IMPRESSION: Improved right pleural effusion. There is persistent opacity at the right lung base.  Mild left basilar atelectasis.   Electronically Signed   By: Maryclare Bean M.D.   On: 07/24/2014 18:56   Dg Chest 2 View  07/20/2014   CLINICAL DATA:  Difficulty swallowing with thick mucus in the throat. Cough.  EXAM: CHEST  2 VIEW  COMPARISON:  05/21/2014  and 05/20/2014  FINDINGS: The large right pleural effusion has enlarged in the interim. There is marked volume loss related to the large right pleural effusion. Left lung is clear. Heart size is normal. The trachea is midline. Surgical clips in the right upper abdomen. Again noted are compression fractures in the upper lumbar spine.  IMPRESSION: Enlargement of the right pleural effusion. Pleural effusion is large for size.   Electronically Signed   By: Richarda Overlie M.D.   On: 07/20/2014 12:00   Dg Abd 1 View  07/29/2014   CLINICAL DATA:  80 year old for an enteric tube evaluation  EXAM: ABDOMEN - 1 VIEW  COMPARISON:  None.  FINDINGS: A weighted enteric tube tip projects over the expected position of the gastric fundus. Metallic clips project over the right upper quadrant. The bowel gas pattern is nonobstructive. Contrast  material is noted within the rectum. A right hip replacement and a left intramedullary rod fixation device traverses the left femur.  Multilevel degenerative changes of the spine are present. There is leftward lumbar scoliosis.  IMPRESSION: Weighted enteric tube with tip projecting over the expected position of the gastric fundus.   Electronically Signed   By: Fannie Knee   On: 07/29/2014 13:37   Ct Head Wo Contrast  07/24/2014   CLINICAL DATA:  Aspiration.  Dysphagia.  EXAM: CT HEAD WITHOUT CONTRAST  TECHNIQUE: Contiguous axial images were obtained from the base of the skull through the vertex without intravenous contrast.  COMPARISON:  05/20/2014  FINDINGS: Chronic ischemic changes in the periventricular white matter. Global atrophy appropriate to age. No mass effect, midline shift, or acute intracranial hemorrhage. Cranium is intact.  IMPRESSION: No acute intracranial pathology.   Electronically Signed   By: Maryclare Bean M.D.   On: 07/24/2014 18:37   Ct Chest W Contrast  07/25/2014   CLINICAL DATA:  Further evaluation of right lung base density  EXAM: CT CHEST WITH CONTRAST  TECHNIQUE:  Multidetector CT imaging of the chest was performed during intravenous contrast administration.  CONTRAST:  80mL OMNIPAQUE IOHEXOL 300 MG/ML  SOLN  COMPARISON:  Chest radiograph 07/24/2014  FINDINGS: Ppm there is a large right pleural effusion, larger than what would be suspected based upon the chest radiograph. As a result, there is extensive right lung compressive atelectasis.  The aerated portion of the right lung is clear except for discoid atelectasis. There is mild atelectasis posteriorly at the left lung base. In the otherwise clear.  Minimal calcification of the thoracic aorta. Heavy coronary artery calcification. No significant pericardial effusion. Trace pericardial fluid noted.  Images to the upper abdomen show a nodular contour to the liver suggesting possibility of cirrhosis. Left renal cyst partially visualized.  Severe acute appearing L1 compression deformity.  IMPRESSION: 1. Large right pleural effusion 2. Severe acute appearing L1 compression deformity 3. Appearance of the liver suggests cirrhosis   Electronically Signed   By: Esperanza Heir M.D.   On: 07/25/2014 21:06   Mr Brain Wo Contrast  07/24/2014   CLINICAL DATA:  Initial evaluation for a dysphagia, dysarthria a. question stroke.  EXAM: MRI HEAD WITHOUT CONTRAST  MRA HEAD WITHOUT CONTRAST  TECHNIQUE: Multiplanar, multiecho pulse sequences of the brain and surrounding structures were obtained without intravenous contrast. Angiographic images of the head were obtained using MRA technique without contrast.  COMPARISON:  None.  Prior CT performed earlier the same day as well as previous MRI from 05/21/2014.  FINDINGS: MRI HEAD FINDINGS  Diffuse prominence of the CSF containing spaces is compatible with moderate generalized cerebral atrophy. Patchy T2/FLAIR hyperintensity within the periventricular and deep white matter most consistent with mild chronic small vessel ischemic disease.  No focal parenchymal signal abnormality is identified. No  mass lesion, midline shift, or extra-axial fluid collection. Ventricles are normal in size without evidence of hydrocephalus.  No diffusion-weighted signal abnormality is identified to suggest acute intracranial infarct. Gray-white matter differentiation is maintained. Normal flow voids are seen within the intracranial vasculature. No intracranial hemorrhage identified. Susceptibility artifact within the frontal lobes bilaterally again seen, compatible with remote blood products, perhaps related to remote trauma.  The cervicomedullary junction is normal. Pituitary gland is within normal limits. Pituitary stalk is midline. The globes and optic nerves demonstrate a normal appearance with normal signal intensity.  The bone marrow signal intensity is normal. Calvarium is intact. Visualized upper cervical  spine is within normal limits.  Scalp soft tissues are unremarkable.  Paranasal sinuses are clear.  No mastoid effusion.  MRA HEAD FINDINGS  ANTERIOR CIRCULATION:  The visualized portions of the distal cervical internal carotid arteries are widely patent with antegrade flow. The petrous, cavernous, and supra clinoid segments are symmetric in caliber bilaterally. A1 segments, anterior communicating artery, and anterior cerebral arteries are widely patent. Middle cerebral arteries are widely patent with antegrade flow without high-grade flow-limiting stenosis or proximal branch occlusion. Distal MCA branches are symmetric bilaterally. No intracranial aneurysm within the anterior circulation.  POSTERIOR CIRCULATION:  The left vertebral artery is dominant. Vertebral arteries are widely patent with antegrade flow. Posterior inferior cerebral arteries patent. Vertebrobasilar junction within normal limits. Basilar artery is mildly tortuous but widely patent. No basilar tip stenosis or aneurysm. Posterior cerebral arteries well opacified bilaterally. Superior cerebellar arteries are patent.  IMPRESSION: MRI HEAD IMPRESSION:  1.  No acute intracranial infarct or other abnormality identified. 2. Moderate atrophy with chronic small vessel ischemic disease.  MRA HEAD IMPRESSION:  Normal MRA of the intracranial circulation with no proximal branch occlusion or hemodynamically significant stenosis identified.   Electronically Signed   By: Rise Mu M.D.   On: 07/24/2014 23:10   Mr Abdomen W Wo Contrast  07/28/2014   CLINICAL DATA:  Evaluate pancreatic cysts and renal lesions demonstrated on recent CT scan.  EXAM: MRI ABDOMEN WITHOUT AND WITH CONTRAST  TECHNIQUE: Multiplanar multisequence MR imaging of the abdomen was performed both before and after the administration of intravenous contrast.  CONTRAST:  10 cc MultiHance  COMPARISON:  CT scan 07/26/2014  FINDINGS: Examination is somewhat limited by breathing motion artifact.  Small simple appearing hepatic cysts are noted. There is a tortuous vessel in the right hepatic lobe near the dome which is most likely a vascular shunt. The portal and hepatic veins are patent. No worrisome hepatic lesions. The liver contour is abnormal and there are portal venous collaterals, mainly perisplenic collaterals which may suggest cirrhosis and portal hypertension. No splenomegaly or ascites.  There is a large right pleural effusion as demonstrated on the CT scan.  Multiple cystic lesions are noted in the pancreas. These are most likely benign cysts or pseudocysts. I do not see any worrisome enhancement. Small side-branch IPMNs are possible.  There are numerous small renal cysts. There are also several proteinaceous/ hemorrhagic cysts. The lesion involving the upper pole of the left kidney is a hemorrhagic cyst. No worrisome renal lesions. No hydronephrosis.  No mesenteric or retroperitoneal mass or adenopathy. Atherosclerotic changes involving aorta are minimal for age. No significant bony findings.  IMPRESSION: 1. Probable mild cirrhotic changes involving the liver with portal venous hypertension  and portal venous collaterals. No splenomegaly or ascites. 2. Small pancreatic cysts are likely benign. 3. Small simple cysts and hemorrhagic/proteinaceous renal cysts. No worrisome renal lesions. 4. Given the patient's age and the MR imaging findings I do not think any further imaging evaluation or follow-up is indicated.   Electronically Signed   By: Loralie Champagne M.D.   On: 07/28/2014 12:24   Ct Abdomen Pelvis W Contrast  07/26/2014   CLINICAL DATA:  Patient with history of weight loss and weakness. Evaluate for occult neoplasm.  EXAM: CT ABDOMEN AND PELVIS WITH CONTRAST  TECHNIQUE: Multidetector CT imaging of the abdomen and pelvis was performed using the standard protocol following bolus administration of intravenous contrast.  CONTRAST:  OMNIPAQUE IOHEXOL 300 MG/ML  SOLN  COMPARISON:  Chest CT  07/25/2014; abdominal ultrasound 07/21/2014.  FINDINGS: Visualization of the lower thorax demonstrates normal heart size. Extensive coronary arterial vascular calcifications. No pericardial effusion. There is a moderate to large right pleural effusion. There is associated atelectasis of the right middle and lower lobes. Persistent consolidative opacities within the left lower lobe.  Mild nodular contour to the liver. Sub cm low-attenuation lesion within the hepatic dome (image 19; series 4, too small to accurately characterize. Status post cholecystectomy. Spleen is unremarkable.  There are multiple low-attenuation cystic lesions throughout pancreas involving the head, body and tail. The largest lesion within the pancreatic mid body measures 2 cm. This is increased in size when compared to prior evaluations. Unchanged calcifications within the region of the pancreatic head.  Normal bilateral adrenal glands.  Multiple bilateral low-attenuation renal lesions, too small to accurately characterize. Note is made of a simple cyst off the inferior pole of the right kidney measuring 1.5 cm and a simple cyst in the  interpolar region of the left kidney measuring 2.4 cm.  Additionally, off the superior pole of the left kidney there is a 1.6 cm renal lesion with an internal density of 60 Hounsfield units (image 78; series 7).  Normal caliber abdominal aorta. Multiple upper abdominal collateral vessels are demonstrated, likely secondary to portal venous hypertension. No retroperitoneal lymphadenopathy. Small fat containing periumbilical hernia. No retroperitoneal lymphadenopathy. Nonspecific soft tissue density material within the right inguinal region.  Streak artifact limits evaluation of the pelvis. Urinary bladder is distended. Sigmoid colonic diverticulosis without CT evidence for acute diverticulitis. Normal appendix. No evidence for bowel obstruction. No free fluid or free intraperitoneal air.  Postsurgical change within the proximal femurs bilaterally. Extensive lower lumbar spine degenerative change. L1 and L2 vertebral body compression deformities. No aggressive or acute appearing osseous lesions.  IMPRESSION: 1. Hyper attenuating lesion off the superior aspect of the left kidney may represent a solid renal mass or hemorrhagic cyst. This needs dedicated evaluation with either pre and postcontrast CT enhanced imaging or MRI. While the lesion may have potentially been present on prior examinations, the lack of coronal and sagittal imaging reformats and the location of the lesion, may comparison difficult. 2. Large right pleural effusion. Consolidative opacities left lower lobe favored to represent atelectasis. Infection could have a similar appearance. Recommend continued radiographic followup to ensure resolution. 3. Nodular contour of the liver with multiple upper abdominal portal venous collateral vessels, suggestive of cirrhosis and portal venous hypertension. 4. Multiple cystic lesions within the pancreas, largest in the midbody measuring 2 cm, increased in size from prior exam 11/11/2009. In the non-acute setting,  recommend further characterization with MRI. 5. Nonspecific soft tissue within the right inguinal region, recommend clinical correlation for the possibility of retracted testicle. 6. Re- demonstrated marked compression deformity of the L1 and L2 vertebral bodies. While they are age-indeterminate, the L1 vertebral compression deformity may be acute. These results will be called to the ordering clinician or representative by the Radiologist Assistant, and communication documented in the PACS or zVision Dashboard.   Electronically Signed   By: Annia Belt M.D.   On: 07/26/2014 14:36   Dg Op Swallowing Func-medicare/speech Path  07/23/2014   CLINICAL DATA:  Chronic, recurrent right pleural effusion. Confusion. Dysphagia.  EXAM: MODIFIED BARIUM SWALLOW  TECHNIQUE: Different consistencies of barium were administered orally to the patient by the Speech Pathologist. Imaging of the pharynx was performed in the lateral projection.  FLUOROSCOPY TIME:  1 min, 29 seconds.  COMPARISON:  None.  FINDINGS:  Thin liquid- administered on a spoon with chin-tuck and with the head turned to the left. There was silent aspiration with both swallows and retention of barium.  Nectar thick liquid- administered with a spoon. There was premature spill and silent aspiration. Retention of barium is identified.  Honey- deferred.  Puree- deferred.  Cracker-deferred.  Puree with cracker- deferred.  Barium tablet -  deferred.  IMPRESSION: As above.  Please refer to the Speech Pathologists report for complete details and recommendations.  Insert swallowing function Please select appropriate template.   Electronically Signed   By: Drusilla Kanner M.D.   On: 07/23/2014 14:25   Mr Maxine Glenn Head/brain Wo Cm  07/24/2014   CLINICAL DATA:  Initial evaluation for a dysphagia, dysarthria a. question stroke.  EXAM: MRI HEAD WITHOUT CONTRAST  MRA HEAD WITHOUT CONTRAST  TECHNIQUE: Multiplanar, multiecho pulse sequences of the brain and surrounding structures  were obtained without intravenous contrast. Angiographic images of the head were obtained using MRA technique without contrast.  COMPARISON:  None.  Prior CT performed earlier the same day as well as previous MRI from 05/21/2014.  FINDINGS: MRI HEAD FINDINGS  Diffuse prominence of the CSF containing spaces is compatible with moderate generalized cerebral atrophy. Patchy T2/FLAIR hyperintensity within the periventricular and deep white matter most consistent with mild chronic small vessel ischemic disease.  No focal parenchymal signal abnormality is identified. No mass lesion, midline shift, or extra-axial fluid collection. Ventricles are normal in size without evidence of hydrocephalus.  No diffusion-weighted signal abnormality is identified to suggest acute intracranial infarct. Gray-white matter differentiation is maintained. Normal flow voids are seen within the intracranial vasculature. No intracranial hemorrhage identified. Susceptibility artifact within the frontal lobes bilaterally again seen, compatible with remote blood products, perhaps related to remote trauma.  The cervicomedullary junction is normal. Pituitary gland is within normal limits. Pituitary stalk is midline. The globes and optic nerves demonstrate a normal appearance with normal signal intensity.  The bone marrow signal intensity is normal. Calvarium is intact. Visualized upper cervical spine is within normal limits.  Scalp soft tissues are unremarkable.  Paranasal sinuses are clear.  No mastoid effusion.  MRA HEAD FINDINGS  ANTERIOR CIRCULATION:  The visualized portions of the distal cervical internal carotid arteries are widely patent with antegrade flow. The petrous, cavernous, and supra clinoid segments are symmetric in caliber bilaterally. A1 segments, anterior communicating artery, and anterior cerebral arteries are widely patent. Middle cerebral arteries are widely patent with antegrade flow without high-grade flow-limiting stenosis or  proximal branch occlusion. Distal MCA branches are symmetric bilaterally. No intracranial aneurysm within the anterior circulation.  POSTERIOR CIRCULATION:  The left vertebral artery is dominant. Vertebral arteries are widely patent with antegrade flow. Posterior inferior cerebral arteries patent. Vertebrobasilar junction within normal limits. Basilar artery is mildly tortuous but widely patent. No basilar tip stenosis or aneurysm. Posterior cerebral arteries well opacified bilaterally. Superior cerebellar arteries are patent.  IMPRESSION: MRI HEAD IMPRESSION:  1. No acute intracranial infarct or other abnormality identified. 2. Moderate atrophy with chronic small vessel ischemic disease.  MRA HEAD IMPRESSION:  Normal MRA of the intracranial circulation with no proximal branch occlusion or hemodynamically significant stenosis identified.   Electronically Signed   By: Rise Mu M.D.   On: 07/24/2014 23:10   US Thoracentesis Asp Pleural Space W/img Guide  07/21/2014   INDICATION: Symptomatic right sided pleural effusion  EXAM: US THORACENTESIS ASP PLEURAL SPACE W/IMG GUIDE  COMPARISON:  Previous thoracentesis  MEDICATIONS: 10 cc 1% lidocaine  COMPLICATIONS: None immediate  TECHNIQUE: Informed written consent was obtained from the patient after a discussion of the risks, benefits and alternatives to treatment. A timeout was performed prior to the initiation of the procedure.  Initial ultrasound scanning demonstrates a right pleural effusion. The lower chest was prepped and draped in the usual sterile fashion. 1% lidocaine was used for local anesthesia.  Under direct ultrasound guidance, a 19 gauge, 7-cm, Yueh catheter was introduced. An ultrasound image was saved for documentation purposes. the thoracentesis was performed. The catheter was removed and a dressing was applied. The patient tolerated the procedure well without immediate post procedural complication. The patient was escorted to have an  upright chest radiograph.  FINDINGS: A total of approximately 1.5 liters of yellow fluid was removed. Requested samples were sent to the laboratory.  IMPRESSION: Successful ultrasound-guided right sided thoracentesis yielding 1.5 liters of pleural fluid.  Read by::  Robet LeuPamela A Ahliya [facility]   Electronically Signed   By: Oley Balmaniel  Hassell M.D.   On: 07/21/2014 11:47    Labs:  CBC:  Recent Labs  05/20/14 1728 05/21/14 0432 07/20/14 1130 07/25/14 0453  WBC 5.6 4.4 5.4 5.9  HGB 12.1* 11.9* 13.7 13.0  HCT 36.7* 36.0* 40.5 40.6  PLT 110* 115* 128* 152    COAGS:  Recent Labs  10/17/13 0950  INR 1.14    BMP:  Recent Labs  07/24/14 1500 07/25/14 0453 07/27/14 0718 07/28/14 0805  NA 149* 154* 151* 145  K 4.3 4.3 4.3 4.3  CL 110 115* 114* 113*  CO2 23 20 23 21   GLUCOSE 100* 71 120* 147*  BUN 42* 38* 26* 17  CALCIUM 9.7 9.3 9.2 9.0  CREATININE 1.44* 1.35 1.24 1.00  GFRNONAA 42* 46* 50* 65*  GFRAA 49* 53* 58* 76*    LIVER FUNCTION TESTS:  Recent Labs  05/21/14 0420 06/06/14 0944 07/20/14 1130 07/24/14 1500 07/25/14 0453  BILITOT 0.9 0.9 1.4* 1.0 0.9  AST 25 23 23 29 27   ALT 13 11 12 15 14   ALKPHOS 103 96 131* 112 101  PROT 6.0 6.0 6.2 6.1 5.7*  ALBUMIN 3.3*  --  3.3* 3.5 3.2*    TUMOR MARKERS: No results for input(s): AFPTM, CEA, CA199, CHROMGRNA in the last 8760 hours.  Assessment and Plan: Dysphagia; aspiration; malnutrition Work up for MG Scheduled for percutaneous gastric tube placement Pt and wife aware of procedure benefits and risks and agreeable to proceed Consent signed and in chart   Thank you for this interesting consult.  I greatly enjoyed meeting [name] and look forward to participating in their care.   I spent a total of 40 minutes face to face in clinical consultation, greater than 50% of which was counseling/coordinating care for perc G tube  Signed: Lianni Kanaan A 07/29/2014, 4:18 PM

## 2014-07-29 NOTE — Care Management Note (Signed)
  Page 2 of 2   08/01/2014     11:22:38 AM CARE MANAGEMENT NOTE 08/01/2014  Patient:  [name],[name]   Account Number:  [id number]  Date Initiated:  07/29/2014  Documentation initiated by:  Jordan FlurryWILE,[name]  Subjective/Objective Assessment:     Action/Plan:   Anticipated DC Date:  08/01/2014   Anticipated DC Plan:  HOME W HOME HEALTH SERVICES         Choice offered to / List presented to:  C-4 Adult Children   DME arranged  TUBE FEEDING      DME agency  [facility]     [facility]  [facility] RN  HH-2 PT  HH-5 SPEECH THERAPY      HH [facility]   Status of service:   Medicare Important Message given?  YES (If response is "NO", the following Medicare IM given date fields will be blank) Date Medicare IM given:  07/29/2014 Medicare IM given by:  Jordan FlurryWILE,[name] Date Additional Medicare IM given:  08/01/2014 Additional Medicare IM given by:  Jordan FlurryHEATHER Hether Horn  [facility] Disposition[facility]  Per UR Regulation:  Reviewed for med. necessity/level of care/duration of stay  If discussed at Long Length of Stay Meetings, dates discussed:   07/29/2014  07/30/2014  07/31/2014    Comments:  08-01-14 Spoke to patient regarding home health . Patient requested NCM to call his daughter [name] [phone] to make home health arrangements .  Called Ms [name] , she is moving her parents to her home today . Patient will be discharging to :  [address] , [place] , [id number]  Contact number is her cell [phone]  Daughter is hoping discharge will be tomorrow.   Called Dietitian  to come to make recommendations for home .  Daughter would like bedside nurse to call her today to schedulde a time for PEG / TF teaching. Message given to [name] .  Patient is active with [name] . [name] .  Jordan instructed NCM to order TF through [facility] ( will do so after Dietitian enters note ) .  Spoke with [name] ,  discharge planned for today . Patient's daughter [name] and [facility]  aware .  [name] coming in today at 1300 for teaching .  Jordan FlurryHeather Alim Cattell RN BSN [phone]

## 2014-07-29 NOTE — Progress Notes (Signed)
Speech Language Pathology Treatment: Dysphagia  Patient Details Name: [name] MRN: [id number] DOB: 01-02-1925 Today's Date: 07/29/2014 Time: [id number] SLP Time Calculation (min) (ACUTE ONLY): 15 min  Assessment / Plan / Recommendation Clinical Impression  Followed up with pt and wife after bedside swallow evaluation yesterday. SLP briefly reviewed results and answered wife's questions.  Educated provided to us a new swab each time during oral care and not re use/store in cup water and provided clinical reasoning. SLP brouight additional swabs and oral care kits to room. Myasthenia Gravis results not back and no decision re: PEG.  Will f/u in several days.   HPI HPI: [name] is a 80 y.o. male, with a pmh of recurrent exudative effusion (which will not be worked up per patient and patient's family), CHF, hip replacement x 2 and BPH. He was [facility] as a [facility] for possible PEG tube placement. [name] developed dysphonia "fat tongued speech" on thanksgiving when he choked on iced tea. His symptoms worsened again on Saturday 11/28 when he again choked on liquids. At this point he is unable to take medications, eat solids or liquids without choking. Per daughter, this has happened to [name] several times in the past - when he has developed a UTI or when his pleural effusion has become large and needs to be drained. Each time the dysphonia and dysphagia improved when the patient was treated for the underlying problem. [name] underwent Speech Eval outpatient 12/2. MBS results showed a high risk for aspiration with moderate oral phase dysphagia; severe pharyngeal phase dysphagia; severe cervical esophageal phase dysphagia.  EGD 10/21/2012 completed by Dr Elnoria HowardHung showed 2 cm hiatal hernia, mild esophagitis and Schtazki's ring. EGD complete 12/6 negative. W/u underway for myesthenia gravis. MRI negative for CVA.    Pertinent Vitals Pain Assessment:  No/denies pain  SLP Plan  Continue with current plan of care    Recommendations Diet recommendations: NPO Medication Administration: Via alternative means              Oral Care Recommendations: Oral care prior to ice chips Follow up Recommendations:  (TBD) Plan: Continue with current plan of care    GO     Royce MacadamiaLitaker, Hollis Tuller Willis 07/29/2014, 3:02 PM  Breck CoonsLisa Willis Lonell FaceLitaker M.Ed [employer]-SL[employer] Pager [phone]

## 2014-07-30 ENCOUNTER — Inpatient Hospital Stay (HOSPITAL_COMMUNITY): Payer: Medicare Other

## 2014-07-30 LAB — GLUCOSE, CAPILLARY
GLUCOSE-CAPILLARY: 115 mg/dL — AB (ref 70–99)
GLUCOSE-CAPILLARY: 110 mg/dL — AB (ref 70–99)
GLUCOSE-CAPILLARY: 132 mg/dL — AB (ref 70–99)
Glucose-Capillary: 121 mg/dL — ABNORMAL HIGH (ref 70–99)
Glucose-Capillary: 126 mg/dL — ABNORMAL HIGH (ref 70–99)

## 2014-07-30 LAB — APTT: APTT: 36 s (ref 24–37)

## 2014-07-30 LAB — PROTIME-INR
INR: 1.11 (ref 0.00–1.49)
PROTHROMBIN TIME: 14.4 s (ref 11.6–15.2)

## 2014-07-30 MED ORDER — MIDAZOLAM HCL 2 MG/2ML IJ SOLN
INTRAMUSCULAR | Status: AC | PRN
  Administered 2014-07-30 (×2): 1 mg via INTRAVENOUS

## 2014-07-30 MED ORDER — GLUCAGON HCL RDNA (DIAGNOSTIC) 1 MG IJ SOLR
INTRAMUSCULAR | Status: AC
  Filled 2014-07-30: qty 1

## 2014-07-30 MED ORDER — HYDROMORPHONE HCL 1 MG/ML IJ SOLN
1.0000 mg | INTRAMUSCULAR | Status: DC | PRN
  Administered 2014-07-30 (×2): 1 mg via INTRAVENOUS
  Filled 2014-07-30 (×2): qty 1

## 2014-07-30 MED ORDER — FENTANYL CITRATE 0.05 MG/ML IJ SOLN
INTRAMUSCULAR | Status: AC | PRN
  Administered 2014-07-30 (×2): 50 ug via INTRAVENOUS

## 2014-07-30 MED ORDER — CEFAZOLIN SODIUM-DEXTROSE 2-3 GM-% IV SOLR
2.0000 g | Freq: Once | INTRAVENOUS | Status: AC
  Administered 2014-07-30: 2 g via INTRAVENOUS
  Filled 2014-07-30: qty 50

## 2014-07-30 MED ORDER — FENTANYL CITRATE 0.05 MG/ML IJ SOLN
INTRAMUSCULAR | Status: AC
  Filled 2014-07-30: qty 4

## 2014-07-30 MED ORDER — GLUCAGON HCL RDNA (DIAGNOSTIC) 1 MG IJ SOLR
INTRAMUSCULAR | Status: AC | PRN
  Administered 2014-07-30: 1 mg via INTRAVENOUS

## 2014-07-30 MED ORDER — HYDROCODONE-ACETAMINOPHEN 5-325 MG PO TABS: 1.0000 | ORAL_TABLET | ORAL | Status: DC | PRN

## 2014-07-30 MED ORDER — CEFAZOLIN SODIUM-DEXTROSE 2-3 GM-% IV SOLR
INTRAVENOUS | Status: AC
  Administered 2014-07-30: 16:00:00
  Filled 2014-07-30: qty 50

## 2014-07-30 MED ORDER — IOHEXOL 300 MG/ML  SOLN
50.0000 mL | Freq: Once | INTRAMUSCULAR | Status: AC | PRN
  Administered 2014-07-30: 15 mL

## 2014-07-30 MED ORDER — LIDOCAINE HCL 1 % IJ SOLN
INTRAMUSCULAR | Status: AC
  Filled 2014-07-30: qty 20

## 2014-07-30 MED ORDER — MIDAZOLAM HCL 2 MG/2ML IJ SOLN
INTRAMUSCULAR | Status: AC
  Filled 2014-07-30: qty 4

## 2014-07-30 MED ORDER — ONDANSETRON HCL 4 MG/2ML IJ SOLN
4.0000 mg | INTRAMUSCULAR | Status: DC | PRN
  Administered 2014-07-31: 4 mg via INTRAVENOUS
  Filled 2014-07-30: qty 2

## 2014-07-30 NOTE — Procedures (Signed)
20F gastrostomy tube placement under fluoro No complication No blood loss. See complete dictation in [facility] PACS.  

## 2014-07-30 NOTE — Sedation Documentation (Signed)
Patient denies pain and is resting comfortably.  

## 2014-07-30 NOTE — Progress Notes (Signed)
[facility] PROGRESS NOTE  Jordan Gallusarl L Horn [id number] DOB: November 06, 1924 DOA: 07/24/2014 PCP: Sharon SellerEUBANKS, JESSICA K, NP  Assessment/Plan:  Principal Problem:   Dysphagia - Etiology uncertain. MRI negative for stroke - Neurology on board and considering other etiology and currently working up for myesthenia gravis - Pending results may have to consider feeding tube. Discussed with family - GI evaluation complete. Pt is s/p EGD with no etiological source identified. - obtained CT of chest/abdomen/pelvis and MRI of abdomen. Work up for occult neoplasm negative - Placed NG tube, RD consult for nutrition supplementation after ng tube placed. Will make npo after midnight.  - Consulted IR for peg tube placement. Plans are for placement 07/30/14. When peg tube able to be used then we will resume tube feeds as recommended by dietitian  Active Problems:   Diastolic CHF, acute - compensated currently    Constipation - no new complaints    CKD (chronic kidney disease) stage 3, GFR 30-59 ml/min - stable - Serum creatinine on last check at 1.0    BPH (benign prostatic hyperplasia) - stable, S creatinine trending down    Severe malnutrition - 2ary to principle problem, currently undergoing work up - NG tube will be placed in the meantime - Family ok with placing peg tube  Code Status: DNR Family Communication: Discussed with daughter and spouse at bedside Disposition Plan: Pending peg tube placement   Consultants:  Neurology  Gastroenterology  Procedures:  MRI/CT of brain and head  Carotid doppler  Echocardiogram  EGD  Antibiotics:  None  HPI/Subjective: Family would like to proceed with peg tube placement. No new complaints.  Objective: Filed Vitals:   07/30/14 0542  BP: 128/65  Pulse: 87  Temp: 98.4 F (36.9 C)  Resp: 20    Intake/Output Summary (Last 24 hours) at 07/30/14 1338 Last data filed at 07/30/14 0533  Gross per 24 hour  Intake   1080 ml   Output    550 ml  Net    530 ml   Filed Weights   07/25/14 0040 07/30/14 0500  Weight: 51.256 kg (113 lb) 51.755 kg (114 lb 1.6 oz)    Exam:   General:  Pt in nad, alert and awake  Cardiovascular: rrr, no mrg  Respiratory: cta bl, no wheezes  Abdomen: soft, NT, ND  Musculoskeletal: no cyanosis or clubbing   Data Reviewed: Basic Metabolic Panel:  Recent Labs Lab 07/24/14 1500 07/25/14 0453 07/27/14 0718 07/28/14 0805  NA 149* 154* 151* 145  K 4.3 4.3 4.3 4.3  CL 110 115* 114* 113*  CO2 23 20 23 21   GLUCOSE 100* 71 120* 147*  BUN 42* 38* 26* 17  CREATININE 1.44* 1.35 1.24 1.00  CALCIUM 9.7 9.3 9.2 9.0  MG 2.4  --   --   --   PHOS 3.1 3.4  --   --    Liver Function Tests:  Recent Labs Lab 07/24/14 1500 07/25/14 0453  AST 29 27  ALT 15 14  ALKPHOS 112 101  BILITOT 1.0 0.9  PROT 6.1 5.7*  ALBUMIN 3.5 3.2*   No results for input(s): LIPASE, AMYLASE in the last 168 hours. No results for input(s): AMMONIA in the last 168 hours. CBC:  Recent Labs Lab 07/25/14 0453  WBC 5.9  HGB 13.0  HCT 40.6  MCV 95.3  PLT 152   Cardiac Enzymes: No results for input(s): CKTOTAL, CKMB, CKMBINDEX, TROPONINI in the last 168 hours. BNP (last 3 results)  Recent Labs  12/27/13  [id number] 05/20/14 1728 07/20/14 1130  PROBNP 347.2 283.4 383.1   CBG:  Recent Labs Lab 07/29/14 1937 07/29/14 2332 07/30/14 0343 07/30/14 0814 07/30/14 1207  GLUCAP 146* 128* 121* 115* 110*    Recent Results (from the past 240 hour(s))  Body fluid culture     Status: None   Collection Time: 07/21/14  9:56 AM  Result Value Ref Range Status   Specimen Description PLEURAL RIGHT  Final   Special Requests NONE  Final   Gram Stain   Final    RARE WBC PRESENT,BOTH PMN AND MONONUCLEAR NO ORGANISMS SEEN Performed at [facility]    Culture   Final    NO GROWTH 3 DAYS Performed at [facility]    Report Status 07/24/2014 FINAL  Final     Studies: Dg Abd 1  View  07/29/2014   CLINICAL DATA:  80 year old for an enteric tube evaluation  EXAM: ABDOMEN - 1 VIEW  COMPARISON:  None.  FINDINGS: A weighted enteric tube tip projects over the expected position of the gastric fundus. Metallic clips project over the right upper quadrant. The bowel gas pattern is nonobstructive. Contrast material is noted within the rectum. A right hip replacement and a left intramedullary rod fixation device traverses the left femur.  Multilevel degenerative changes of the spine are present. There is leftward lumbar scoliosis.  IMPRESSION: Weighted enteric tube with tip projecting over the expected position of the gastric fundus.   Electronically Signed   By: Fannie KneeKenneth  Crosby   On: 07/29/2014 13:37    Scheduled Meds: . antiseptic oral rinse  7 mL Mouth Rinse q12n4p  . chlorhexidine  15 mL Mouth Rinse BID   Continuous Infusions: . dextrose 5 % and 0.45 % NaCl with KCl 20 mEq/L 90 mL/hr at 07/30/14 0011  . feeding supplement (JEVITY 1.2 CAL) Stopped (07/30/14 0002)     Time spent: > 35 minutes    [name], Marielle Mantione  [facility] Pager [phone] If 7PM-7AM, please contact night-coverage at [email or web address], password [email or web address] 07/30/2014, 1:38 PM  LOS: 6 days

## 2014-07-30 NOTE — Progress Notes (Addendum)
Physical Therapy Treatment Patient Details Name: [name] MRN: [id number] DOB: 26-Jun-1925 Today's Date: 07/30/2014    History of Present Illness Pt admit with dysphagia and stroke workup that has been negative thus far.  Plan to be placement of NG tube for nutrition.    PT Comments    Patient progressing well with mobility. Increased ambulation distance today. Progressed to standing without assist after multiple reps and cues demonstrating improved functional strength. To get PEG tube placed today. Informed tech to ambulate pt to bathroom to increase mobility. Will continue to follow acutely.   Follow Up Recommendations  Home health PT;Supervision/Assistance - 24 hour     Equipment Recommendations  None recommended by PT    Recommendations for Other Services       Precautions / Restrictions Precautions Precautions: Fall Restrictions Weight Bearing Restrictions: No    Mobility  Bed Mobility Overal bed mobility: Needs Assistance Bed Mobility: Supine to Sit     Supine to sit: Min guard     General bed mobility comments: VC's for technique and hand placement. increased time and effort. + secretions during movement.  Transfers Overall transfer level: Needs assistance Equipment used: Rolling walker (2 wheeled) Transfers: Sit to/from Stand Sit to Stand: Min assist         General transfer comment: Min A to stand from EOB with cues for anterior translation. Progressed to Min guard assist.  Ambulation/Gait Ambulation/Gait assistance: Min guard Ambulation Distance (Feet): 150 Feet Assistive device: Rolling walker (2 wheeled) Gait Pattern/deviations: Step-through pattern;Decreased stride length;Trunk flexed   Gait velocity interpretation: Below normal speed for age/gender General Gait Details:  VC's for upright posture and forward gaze.   Stairs            Wheelchair Mobility    Modified Rankin (Stroke Patients Only)       Balance Overall  balance assessment: Needs assistance Sitting-balance support: Feet supported;No upper extremity supported Sitting balance-Leahy Scale: Good     Standing balance support: During functional activity Standing balance-Leahy Scale: Poor Standing balance comment: Requires BUE support on RW for balance/safety. Able to perform static standing without UE support.                    Cognition Arousal/Alertness: Awake/alert Behavior During Therapy: WFL for tasks assessed/performed Overall Cognitive Status: Within Functional Limits for tasks assessed                      Exercises General Exercises - Lower Extremity Long Arc Quad: Both;20 reps;Seated Hip ABduction/ADduction: Both;20 reps;Seated Hip Flexion/Marching: Both;20 reps;Seated Toe Raises: Both;20 reps;Seated Heel Raises: Both;20 reps;Seated Sit to stand x8 from chair emphasizing eccentric control of quads into chair.   General Comments General comments (skin integrity, edema, etc.): In good spirits today, family present and xmas music playing.      Pertinent Vitals/Pain Pain Assessment: No/denies pain    Home Living                      Prior Function            PT Goals (current goals can now be found in the care plan section) Progress towards PT goals: Progressing toward goals    Frequency  Min 3X/week    PT Plan Current plan remains appropriate    Co-evaluation             End of Session Equipment Utilized During Treatment: Gait belt Activity Tolerance: Patient  tolerated treatment well Patient left: in chair;with call bell/phone within reach;with family/visitor present     Time: 1137-1202 PT Time Calculation (min) (ACUTE ONLY): 25 min  Charges:  $Gait Training: 8-22 mins $Therapeutic Exercise: 8-22 mins                    G CodesAlvie Heidelberg:      Folan, Brenlynn Fake A 07/30/2014, 1:09 PM  Alvie HeidelbergShauna Folan, PT, DPT [phone]

## 2014-07-30 NOTE — Progress Notes (Signed)
Patient present back on floor from procedure. Report received from [place]. Patient stable.

## 2014-07-30 NOTE — Plan of Care (Signed)
Problem: Phase I Progression Outcomes Goal: Voiding-avoid urinary catheter unless indicated Outcome: Completed/Met Date Met:  07/30/14  Problem: Phase II Progression Outcomes Goal: Vital signs remain stable Outcome: Completed/Met Date Met:  07/30/14 Goal: Obtain order to discontinue catheter if appropriate Outcome: Completed/Met Date Met:  07/30/14

## 2014-07-31 DIAGNOSIS — R1310 Dysphagia, unspecified: Principal | ICD-10-CM

## 2014-07-31 LAB — GLUCOSE, CAPILLARY
GLUCOSE-CAPILLARY: 121 mg/dL — AB (ref 70–99)
Glucose-Capillary: 101 mg/dL — ABNORMAL HIGH (ref 70–99)
Glucose-Capillary: 105 mg/dL — ABNORMAL HIGH (ref 70–99)
Glucose-Capillary: 110 mg/dL — ABNORMAL HIGH (ref 70–99)
Glucose-Capillary: 115 mg/dL — ABNORMAL HIGH (ref 70–99)
Glucose-Capillary: 118 mg/dL — ABNORMAL HIGH (ref 70–99)
Glucose-Capillary: 119 mg/dL — ABNORMAL HIGH (ref 70–99)

## 2014-07-31 LAB — MYASTHENIA GRAVIS PANEL 2
Acety choline binding ab: 12.86 nmol/L — ABNORMAL HIGH
Acetylchol Block Ab: 72 %{inhibition} — ABNORMAL HIGH
Acetylcholine Modulat Ab: 94 % — ABNORMAL HIGH

## 2014-07-31 MED ORDER — SODIUM CHLORIDE 0.9 % IV BOLUS (SEPSIS)
250.0000 mL | Freq: Once | INTRAVENOUS | Status: AC
  Administered 2014-07-31: 250 mL via INTRAVENOUS

## 2014-07-31 NOTE — Progress Notes (Signed)
Mr. [name] is sitting up in recliner and appears more alert today. His daughter is at bedside. He has no complaints and is joking today. He had his PEG placed yesterday with no issues. He denies pain. Family tells me that they are still awaiting lab results over the next week(s) and are hopeful to go home soon as family is already asking for assistance to manage PEG and feeding - I would encourage nursing to show and explain PEG care to family when at bedside. I will continue to follow.  Yong ChannelAlicia Tomia Enlow, NP Palliative Medicine Team Pager # [phone] (M-F 8a-5p) Team Phone # [phone] (Nights/Weekends)

## 2014-07-31 NOTE — Progress Notes (Signed)
Speech Language Pathology Treatment: Dysphagia  Patient Details Name: [name] MRN: [id number] DOB: 08/09/1925 Today's Date: 07/31/2014 Time: 1010-1025 SLP Time Calculation (min) (ACUTE ONLY): 15 min  Assessment / Plan / Recommendation Clinical Impression  Skilled treatment session focused on addressing dysphagia management education with patient and family.  Patient currently NPO following PEG placement procedure 12/9, wife and 2 daughters at bedside.  Family reports understaning that a treatment plan and prognosis cannot be created together until testing reveals a diagnosis; however, in the mean time they are concerned with decline.  This SLP is in agreement and as a result, recommends that patient continue with diligent oral care via suctioning followed by ice chips.  Given current nausea and oral expectoration of secretions, PO trials were held today and SLP did not implement pharyngeal strengthening exercises.  Given likely upcoming discharge patient would benefit from skilled SLP services at the next level of care to continue to address secretion management and implementation of pharyngeal strengthening exercises.  Patient and family in agreement.        HPI HPI: [name] is a 80 y.o. male, with a pmh of recurrent exudative effusion (which will not be worked up per patient and patient's family), CHF, hip replacement x 2 and BPH. He was sent as a [facility] for possible PEG tube placement. [name] developed dysphonia "fat tongued speech" on thanksgiving when he choked on iced tea. His symptoms worsened again on Saturday 11/28 when he again choked on liquids. At this point he is unable to take medications, eat solids or liquids without choking. Per daughter, this has happened to [name] several times in the past - when he has developed a UTI or when his pleural effusion has become large and needs to be drained. Each time the dysphonia and  dysphagia improved when the patient was treated for the underlying problem. [name] underwent Speech Eval outpatient 12/2. MBS results showed a high risk for aspiration with moderate oral phase dysphagia; severe pharyngeal phase dysphagia; severe cervical esophageal phase dysphagia.  EGD 10/21/2012 completed by Dr Elnoria HowardHung showed 2 cm hiatal hernia, mild esophagitis and Schtazki's ring. EGD complete 12/6 negative. Work-up underway for myesthenia gravis with results still pending. MRI negative for CVA. Patient with PEG placed 09/30/13.      Pertinent Vitals Pain Assessment: No/denies pain  SLP Plan  Discharge SLP treatment due to (comment) (all further treatment can be addressed with home health )    Recommendations Diet recommendations: NPO Medication Administration: Via alternative means              Oral Care Recommendations: Oral care prior to ice chips Follow up Recommendations: 24 hour supervision/assistance;Home health SLP;Other (comment) (will need suction equipment at home) Plan: Discharge SLP treatment due to (comment) (all further treatment can be addressed with home health )    GO     Fae PippinMelissa Nathaneal Sommers, M.A., CCC-SLP [phone]  [name] 07/31/2014, 11:20 AM

## 2014-07-31 NOTE — Progress Notes (Signed)
[facility] PROGRESS NOTE  [name] ZOX:[id number] DOB: 1925/04/07 DOA: 07/24/2014 PCP: Sharon SellerEUBANKS, Jordan K, NP  Assessment/Plan:  Principal Problem:   Dysphagia - Etiology uncertain. MRI negative for stroke - Neurology on board and considering other etiology and currently working up for myesthenia gravis - Pending results may have to consider feeding tube. Discussed with family - GI evaluation complete. Pt is s/p EGD with no etiological source identified. - obtained CT of chest/abdomen/pelvis and MRI of abdomen. Work up for occult neoplasm negative - Consulted IR for peg tube placement. Plans are for placement 07/30/14. When peg tube able to be used then we will resume tube feeds as recommended by dietitian  Active Problems:   Diastolic CHF, acute - compensated currently    Constipation - no new complaints    CKD (chronic kidney disease) stage 3, GFR 30-59 ml/min - stable - Serum creatinine on last check at 1.0    BPH (benign prostatic hyperplasia) - stable, S creatinine trending down    Severe malnutrition - 2ary to principle problem, currently undergoing work up - NG tube will be placed in the meantime - Family ok with placing peg tube  Code Status: DNR Family Communication: Discussed with daughter and spouse at bedside Disposition Plan: Most likely d/c next am.   Consultants:  Neurology  Gastroenterology  Procedures:  MRI/CT of brain and head  Carotid doppler  Echocardiogram  EGD  Antibiotics:  None  HPI/Subjective: . No new complaints. Pt feels better  Objective: Filed Vitals:   07/31/14 1340  BP: 130/60  Pulse: 89  Temp: 98 F (36.7 C)  Resp: 18    Intake/Output Summary (Last 24 hours) at 07/31/14 1616 Last data filed at 07/31/14 1300  Gross per 24 hour  Intake      0 ml  Output      0 ml  Net      0 ml   Filed Weights   07/25/14 0040 07/30/14 0500 07/31/14 0551  Weight: 51.256 kg (113 lb) 51.755 kg (114 lb 1.6 oz)  51.393 kg (113 lb 4.8 oz)    Exam:   General:  Pt in nad, alert and awake  Cardiovascular: rrr, no mrg  Respiratory: cta bl, no wheezes  Abdomen: soft, NT, ND  Musculoskeletal: no cyanosis or clubbing   Data Reviewed: Basic Metabolic Panel:  Recent Labs Lab 07/25/14 0453 07/27/14 0718 07/28/14 0805  NA 154* 151* 145  K 4.3 4.3 4.3  CL 115* 114* 113*  CO2 20 23 21   GLUCOSE 71 120* 147*  BUN 38* 26* 17  CREATININE 1.35 1.24 1.00  CALCIUM 9.3 9.2 9.0  PHOS 3.4  --   --    Liver Function Tests:  Recent Labs Lab 07/25/14 0453  AST 27  ALT 14  ALKPHOS 101  BILITOT 0.9  PROT 5.7*  ALBUMIN 3.2*   No results for input(s): LIPASE, AMYLASE in the last 168 hours. No results for input(s): AMMONIA in the last 168 hours. CBC:  Recent Labs Lab 07/25/14 0453  WBC 5.9  HGB 13.0  HCT 40.6  MCV 95.3  PLT 152   Cardiac Enzymes: No results for input(s): CKTOTAL, CKMB, CKMBINDEX, TROPONINI in the last 168 hours. BNP (last 3 results)  Recent Labs  12/27/13 0950 05/20/14 1728 07/20/14 1130  PROBNP 347.2 283.4 383.1   CBG:  Recent Labs Lab 07/30/14 1708 07/30/14 2008 07/31/14 0006 07/31/14 0411 07/31/14 0744  GLUCAP 132* 126* 115* 110* 119*    No results  found for this or any previous visit (from the past 240 hour(s)).   Studies: Ir Gastrostomy Tube Mod Sed  07/30/2014   CLINICAL DATA:  Dysphagia, needs enteral feeding support  EXAM: PERC PLACEMENT GASTROSTOMY  FLUOROSCOPY TIME:  1 min 12 seconds  TECHNIQUE: The procedure, risks, benefits, and alternatives were explained to the patient. Questions regarding the procedure were encouraged and answered. The patient understands and consents to the procedure. As antibiotic prophylaxis, cefazolin 2 g IV was ordered pre-procedure and administered intravenously within one hour of incision.Progression of previously administered oral barium into the colon was confirmed fluoroscopically. A 5 French angiographic  catheter was placed as orogastric tube. The upper abdomen was prepped with Betadine, draped in usual sterile fashion, and infiltrated locally with 1% lidocaine.  Intravenous Fentanyl and Versed were administered as conscious sedation during continuous cardiorespiratory monitoring by the radiology RN, with a total moderate sedation time of 5 minutes.  Stomach was insufflated using air through the orogastric tube. An 6718 French sheath needle was advanced percutaneously into the gastric lumen under fluoroscopy. Gas could be aspirated and a small contrast injection confirmed intraluminal spread. The sheath was exchanged over a guidewire for a 9 [place] vascular sheath, through which the snare device was advanced and used to snare a guidewire passed through the orogastric tube. This was withdrawn, and the snare attached to the 20 French pull-through gastrostomy tube, which was advanced antegrade, positioned with the internal bumper securing the anterior gastric wall to the anterior abdominal wall. Small contrast injection confirms appropriate positioning. The external bumper was applied and the catheter was flushed. No immediate complication.  IMPRESSION: 1. Technically successful 20 French pull-through gastrostomy placement under fluoroscopy.   Electronically Signed   By: Oley Balmaniel  Hassell M.D.   On: 07/30/2014 16:53    Scheduled Meds: . antiseptic oral rinse  7 mL Mouth Rinse q12n4p  . chlorhexidine  15 mL Mouth Rinse BID   Continuous Infusions: . dextrose 5 % and 0.45 % NaCl with KCl 20 mEq/L 90 mL/hr at 07/31/14 1251  . feeding supplement (JEVITY 1.2 CAL) 1,000 mL (07/31/14 1540)     Time spent: > 35 minutes    [name], Rodrigues Urbanek  [facility] Pager [phone] If 7PM-7AM, please contact night-coverage at [email or web address], password [email or web address] 07/31/2014, 4:16 PM  LOS: 7 days

## 2014-07-31 NOTE — Plan of Care (Signed)
Problem: SLP Dysphagia Goals Goal: Patient will demonstrate readiness for PO's Patient will demonstrate readiness for PO's and/or instrumental swallow study as evidenced by:  Outcome: Completed/Met Date Met:  07/31/14  Comments:  Patient managing secretions orally with supplemental use of suction [place].

## 2014-07-31 NOTE — Progress Notes (Signed)
Around 2315 last night, patient was moaning and fidgeting, his daughter requested that he received pain medication.  With her permission, I administered Dilaudid 1 mg IV.  Patient tolerated well and fell asleep.  This am same daughter is claiming that the medicine is making him restless.  Patient appears comfortable and is not complaining and he is alert and verbal.

## 2014-08-01 ENCOUNTER — Ambulatory Visit: Payer: Medicare Other | Admitting: Neurology

## 2014-08-01 LAB — GLUCOSE, CAPILLARY
Glucose-Capillary: 125 mg/dL — ABNORMAL HIGH (ref 70–99)
Glucose-Capillary: 111 mg/dL — ABNORMAL HIGH (ref 70–99)
Glucose-Capillary: 114 mg/dL — ABNORMAL HIGH (ref 70–99)

## 2014-08-01 MED ORDER — ACETAMINOPHEN 325 MG PO TABS: 650.0000 mg | ORAL_TABLET | ORAL | Status: DC | PRN

## 2014-08-01 MED ORDER — SILODOSIN 8 MG PO CAPS: 8.0000 mg | ORAL_CAPSULE | Freq: Every day | ORAL | Status: DC

## 2014-08-01 MED ORDER — JEVITY 1.2 CAL PO LIQD: 237.0000 mL | Freq: Every day | ORAL | Status: DC

## 2014-08-01 MED ORDER — JEVITY 1.2 CAL PO LIQD: 1000.0000 mL | ORAL | Status: DC

## 2014-08-01 NOTE — Discharge Summary (Signed)
Physician Discharge Summary  [name] ZOX:[id number] DOB: November 27, 1924 DOA: 07/24/2014  PCP: Jordan Seller, NP  Admit date: 07/24/2014 Discharge date: 08/01/2014  Time spent: > 35 minutes  Recommendations for Outpatient Follow-up:  1. Pt will need to follow up with Neurology for further work up for sudden onset orodysphagia  Discharge Diagnoses:  Principal Problem:   Dysphagia Active Problems:   Diastolic CHF, acute   Constipation   CKD (chronic kidney disease) stage 3, GFR 30-59 ml/min   BPH (benign prostatic hyperplasia)   Dysphonia   Severe malnutrition   Hypernatremia   Palliative care encounter   Decreased appetite   Weakness   Protein-calorie malnutrition, severe   Discharge Condition: stable  Diet recommendation: Tube feeds otherwise NPO  Filed Weights   07/30/14 0500 07/31/14 0551 08/01/14 0416  Weight: 51.755 kg (114 lb 1.6 oz) 51.393 kg (113 lb 4.8 oz) 53.661 kg (118 lb 4.8 oz)    History of present illness:  From original HPI: 81 y.o. male, with a pmh of recurrent exudative effusion (which will not be worked up per patient and patient's family), CHF, hip replacement x 2 and BPH. He was sent as a direct admit from [facility] for possible PEG tube placement. [name] is quite coherent, but has a supportive family and his daughter gives much of the history. [name] developed dysphonia "fat tongued speech" on thanksgiving when he choked on iced tea. His symptoms worsened again on Saturday 11/28 when he again choked on liquids. At this point he is unable to take medications, eat solids or liquids without choking. Per daughter, this has happened to [name] several times in the past - when he has developed a UTI or when his pleural effusion has become large and needs to be drained. Each time the dysphonia and dysphagia improved when the patient was treated for the underlying problem. [name] underwent Speech Eval outpatient  12/2. Results showed a high risk for aspiration with moderate oral phase dysphagia; severe pharyngeal phase dysphagia; severe cervical esophageal phase dysphagia. Given current neurologic concerns, the patient is being admitted under stroke protocol.   Hospital Course:  Principal Problem:  Dysphagia - Etiology uncertain. MRI negative for stroke - Neurology on board and considering other etiology and currently working up for myesthenia gravis - Pending results may have to consider feeding tube. Discussed with family - GI evaluation complete. Pt is s/p EGD with no etiological source identified. - obtained CT of chest/abdomen/pelvis and MRI of abdomen. Work up for occult neoplasm negative - Consulted IR for peg tube placement. Plans are for placement 07/30/14.  - Dietitian to specify rate of jevity once patient transition home - Home health speech therapy and f/u with neurology in 1-2 weeks with Dr. Pearlean Brownie  Active Problems:  Diastolic CHF, acute - compensated currently   Constipation - no new complaints   CKD (chronic kidney disease) stage 3, GFR 30-59 ml/min - stable - Serum creatinine on last check at 1.0   BPH (benign prostatic hyperplasia) - stable, S creatinine trending down   Severe malnutrition - Please see above  Procedures:  IR peg tube placement  GI: upper endoscopy  Consultations:  IR  Neurology   GI  Discharge Exam: Filed Vitals:   08/01/14 0416  BP: 110/50  Pulse: 83  Temp: 97.9 F (36.6 C)  Resp: 16    General: Pt in nad, alert and awake Cardiovascular: rrr, no mrg Respiratory: cta bl, no wheezes  Discharge Instructions You  were cared for by a hospitalist during your hospital stay. If you have any questions about your discharge medications or the care you received while you were in the hospital after you are discharged, you can call the unit and asked to speak with the hospitalist on call if the hospitalist that took care of you is not  available. Once you are discharged, your primary care physician will handle any further medical issues. Please note that NO REFILLS for any discharge medications will be authorized once you are discharged, as it is imperative that you return to your primary care physician (or establish a relationship with a primary care physician if you do not have one) for your aftercare needs so that they can reassess your need for medications and monitor your lab values.  Discharge Instructions    Call MD for:  severe uncontrolled pain    Complete by:  As directed      Call MD for:  temperature >100.4    Complete by:  As directed      Diet - low sodium heart healthy    Complete by:  As directed      Discharge instructions    Complete by:  As directed   Home with home health and speech therapy at home.  Will need continued tube feeds until patient's eating condition improves. Also will need routine tube feeding care.  Jevity tube feeds at home to be detailed by RD prior to discharge.     Increase activity slowly    Complete by:  As directed           Current Discharge Medication List    CONTINUE these medications which have CHANGED   Details  acetaminophen (TYLENOL) 325 MG tablet Place 2 tablets (650 mg total) into feeding tube every 4 (four) hours as needed for mild pain (or temp >/= 99.5 F).    Nutritional Supplements (FEEDING SUPPLEMENT, JEVITY 1.2 CAL,) LIQD Place 1,000 mLs into feeding tube continuous. Refills: 0    silodosin (RAPAFLO) 8 MG CAPS capsule Place 1 capsule (8 mg total) into feeding tube daily with breakfast. Qty: 30 capsule      CONTINUE these medications which have NOT CHANGED   Details  naphazoline-glycerin (CLEAR EYES) 0.012-0.2 % SOLN Place 1-2 drops into both eyes every 4 (four) hours as needed for irritation.      STOP taking these medications     allopurinol (ZYLOPRIM) 100 MG tablet      Polyethyl Glycol-Propyl Glycol (SYSTANE) 0.4-0.3 % GEL        Allergies   Allergen Reactions  . Sertraline Nausea Only  . Nsaids Other (See Comments)    history of renal failure      The results of significant diagnostics from this hospitalization (including imaging, microbiology, ancillary and laboratory) are listed below for reference.    Significant Diagnostic Studies: Dg Chest 1 View  07/21/2014   CLINICAL DATA:  Post RIGHT thoracentesis  EXAM: CHEST - 1 VIEW  COMPARISON:  07/20/2014  FINDINGS: Decrease in RIGHT pleural effusion post thoracentesis.  No pneumothorax.  Persistent RIGHT basilar atelectasis.  LEFT lung clear.  Bones demineralized.  IMPRESSION: Decreased RIGHT pleural effusion and basilar atelectasis post thoracentesis without pneumothorax.   Electronically Signed   By: Ulyses SouthwardMark  Boles M.D.   On: 07/21/2014 10:24   Dg Chest 2 View  07/24/2014   CLINICAL DATA:  Cough for 1 week  EXAM: CHEST  2 VIEW  COMPARISON:  07/21/2014  FINDINGS: Right pleural  effusion improved. Persistent opacity at the right lung base. Left lung is under aerated with mild basilar atelectasis. Skin fold along the left lateral hemi thorax at the costophrenic angle is suspected and not significantly changed. Normal heart size.  IMPRESSION: Improved right pleural effusion. There is persistent opacity at the right lung base.  Mild left basilar atelectasis.   Electronically Signed   By: Maryclare Bean M.D.   On: 07/24/2014 18:56   Dg Chest 2 View  07/20/2014   CLINICAL DATA:  Difficulty swallowing with thick mucus in the throat. Cough.  EXAM: CHEST  2 VIEW  COMPARISON:  05/21/2014 and 05/20/2014  FINDINGS: The large right pleural effusion has enlarged in the interim. There is marked volume loss related to the large right pleural effusion. Left lung is clear. Heart size is normal. The trachea is midline. Surgical clips in the right upper abdomen. Again noted are compression fractures in the upper lumbar spine.  IMPRESSION: Enlargement of the right pleural effusion. Pleural effusion is large for  size.   Electronically Signed   By: Richarda Overlie M.D.   On: 07/20/2014 12:00   Dg Abd 1 View  07/29/2014   CLINICAL DATA:  80 year old for an enteric tube evaluation  EXAM: ABDOMEN - 1 VIEW  COMPARISON:  None.  FINDINGS: A weighted enteric tube tip projects over the expected position of the gastric fundus. Metallic clips project over the right upper quadrant. The bowel gas pattern is nonobstructive. Contrast material is noted within the rectum. A right hip replacement and a left intramedullary rod fixation device traverses the left femur.  Multilevel degenerative changes of the spine are present. There is leftward lumbar scoliosis.  IMPRESSION: Weighted enteric tube with tip projecting over the expected position of the gastric fundus.   Electronically Signed   By: Fannie Knee   On: 07/29/2014 13:37   Ct Head Wo Contrast  07/24/2014   CLINICAL DATA:  Aspiration.  Dysphagia.  EXAM: CT HEAD WITHOUT CONTRAST  TECHNIQUE: Contiguous axial images were obtained from the base of the skull through the vertex without intravenous contrast.  COMPARISON:  05/20/2014  FINDINGS: Chronic ischemic changes in the periventricular white matter. Global atrophy appropriate to age. No mass effect, midline shift, or acute intracranial hemorrhage. Cranium is intact.  IMPRESSION: No acute intracranial pathology.   Electronically Signed   By: Maryclare Bean M.D.   On: 07/24/2014 18:37   Ct Chest W Contrast  07/25/2014   CLINICAL DATA:  Further evaluation of right lung base density  EXAM: CT CHEST WITH CONTRAST  TECHNIQUE: Multidetector CT imaging of the chest was performed during intravenous contrast administration.  CONTRAST:  80mL OMNIPAQUE IOHEXOL 300 MG/ML  SOLN  COMPARISON:  Chest radiograph 07/24/2014  FINDINGS: Ppm there is a large right pleural effusion, larger than what would be suspected based upon the chest radiograph. As a result, there is extensive right lung compressive atelectasis.  The aerated portion of the right lung is  clear except for discoid atelectasis. There is mild atelectasis posteriorly at the left lung base. In the otherwise clear.  Minimal calcification of the thoracic aorta. Heavy coronary artery calcification. No significant pericardial effusion. Trace pericardial fluid noted.  Images to the upper abdomen show a nodular contour to the liver suggesting possibility of cirrhosis. Left renal cyst partially visualized.  Severe acute appearing L1 compression deformity.  IMPRESSION: 1. Large right pleural effusion 2. Severe acute appearing L1 compression deformity 3. Appearance of the liver suggests cirrhosis  Electronically Signed   By: Esperanza Heiraymond  Rubner M.D.   On: 07/25/2014 21:06   Mr Brain Wo Contrast  07/24/2014   CLINICAL DATA:  Initial evaluation for a dysphagia, dysarthria a. question stroke.  EXAM: MRI HEAD WITHOUT CONTRAST  MRA HEAD WITHOUT CONTRAST  TECHNIQUE: Multiplanar, multiecho pulse sequences of the brain and surrounding structures were obtained without intravenous contrast. Angiographic images of the head were obtained using MRA technique without contrast.  COMPARISON:  None.  Prior CT performed earlier the same day as well as previous MRI from 05/21/2014.  FINDINGS: MRI HEAD FINDINGS  Diffuse prominence of the CSF containing spaces is compatible with moderate generalized cerebral atrophy. Patchy T2/FLAIR hyperintensity within the periventricular and deep white matter most consistent with mild chronic small vessel ischemic disease.  No focal parenchymal signal abnormality is identified. No mass lesion, midline shift, or extra-axial fluid collection. Ventricles are normal in size without evidence of hydrocephalus.  No diffusion-weighted signal abnormality is identified to suggest acute intracranial infarct. Gray-white matter differentiation is maintained. Normal flow voids are seen within the intracranial vasculature. No intracranial hemorrhage identified. Susceptibility artifact within the frontal lobes  bilaterally again seen, compatible with remote blood products, perhaps related to remote trauma.  The cervicomedullary junction is normal. Pituitary gland is within normal limits. Pituitary stalk is midline. The globes and optic nerves demonstrate a normal appearance with normal signal intensity.  The bone marrow signal intensity is normal. Calvarium is intact. Visualized upper cervical spine is within normal limits.  Scalp soft tissues are unremarkable.  Paranasal sinuses are clear.  No mastoid effusion.  MRA HEAD FINDINGS  ANTERIOR CIRCULATION:  The visualized portions of the distal cervical internal carotid arteries are widely patent with antegrade flow. The petrous, cavernous, and supra clinoid segments are symmetric in caliber bilaterally. A1 segments, anterior communicating artery, and anterior cerebral arteries are widely patent. Middle cerebral arteries are widely patent with antegrade flow without high-grade flow-limiting stenosis or proximal branch occlusion. Distal MCA branches are symmetric bilaterally. No intracranial aneurysm within the anterior circulation.  POSTERIOR CIRCULATION:  The left vertebral artery is dominant. Vertebral arteries are widely patent with antegrade flow. Posterior inferior cerebral arteries patent. Vertebrobasilar junction within normal limits. Basilar artery is mildly tortuous but widely patent. No basilar tip stenosis or aneurysm. Posterior cerebral arteries well opacified bilaterally. Superior cerebellar arteries are patent.  IMPRESSION: MRI HEAD IMPRESSION:  1. No acute intracranial infarct or other abnormality identified. 2. Moderate atrophy with chronic small vessel ischemic disease.  MRA HEAD IMPRESSION:  Normal MRA of the intracranial circulation with no proximal branch occlusion or hemodynamically significant stenosis identified.   Electronically Signed   By: Rise MuBenjamin  McClintock M.D.   On: 07/24/2014 23:10   Mr Abdomen W Wo Contrast  07/28/2014   CLINICAL DATA:   Evaluate pancreatic cysts and renal lesions demonstrated on recent CT scan.  EXAM: MRI ABDOMEN WITHOUT AND WITH CONTRAST  TECHNIQUE: Multiplanar multisequence MR imaging of the abdomen was performed both before and after the administration of intravenous contrast.  CONTRAST:  10 cc MultiHance  COMPARISON:  CT scan 07/26/2014  FINDINGS: Examination is somewhat limited by breathing motion artifact.  Small simple appearing hepatic cysts are noted. There is a tortuous vessel in the right hepatic lobe near the dome which is most likely a vascular shunt. The portal and hepatic veins are patent. No worrisome hepatic lesions. The liver contour is abnormal and there are portal venous collaterals, mainly perisplenic collaterals which may suggest cirrhosis and portal  hypertension. No splenomegaly or ascites.  There is a large right pleural effusion as demonstrated on the CT scan.  Multiple cystic lesions are noted in the pancreas. These are most likely benign cysts or pseudocysts. I do not see any worrisome enhancement. Small side-branch IPMNs are possible.  There are numerous small renal cysts. There are also several proteinaceous/ hemorrhagic cysts. The lesion involving the upper pole of the left kidney is a hemorrhagic cyst. No worrisome renal lesions. No hydronephrosis.  No mesenteric or retroperitoneal mass or adenopathy. Atherosclerotic changes involving aorta are minimal for age. No significant bony findings.  IMPRESSION: 1. Probable mild cirrhotic changes involving the liver with portal venous hypertension and portal venous collaterals. No splenomegaly or ascites. 2. Small pancreatic cysts are likely benign. 3. Small simple cysts and hemorrhagic/proteinaceous renal cysts. No worrisome renal lesions. 4. Given the patient's age and the MR imaging findings I do not think any further imaging evaluation or follow-up is indicated.   Electronically Signed   By: Loralie Champagne M.D.   On: 07/28/2014 12:24   Ct Abdomen Pelvis  W Contrast  07/26/2014   CLINICAL DATA:  Patient with history of weight loss and weakness. Evaluate for occult neoplasm.  EXAM: CT ABDOMEN AND PELVIS WITH CONTRAST  TECHNIQUE: Multidetector CT imaging of the abdomen and pelvis was performed using the standard protocol following bolus administration of intravenous contrast.  CONTRAST:  OMNIPAQUE IOHEXOL 300 MG/ML  SOLN  COMPARISON:  Chest CT 07/25/2014; abdominal ultrasound 07/21/2014.  FINDINGS: Visualization of the lower thorax demonstrates normal heart size. Extensive coronary arterial vascular calcifications. No pericardial effusion. There is a moderate to large right pleural effusion. There is associated atelectasis of the right middle and lower lobes. Persistent consolidative opacities within the left lower lobe.  Mild nodular contour to the liver. Sub cm low-attenuation lesion within the hepatic dome (image 19; series 4, too small to accurately characterize. Status post cholecystectomy. Spleen is unremarkable.  There are multiple low-attenuation cystic lesions throughout pancreas involving the head, body and tail. The largest lesion within the pancreatic mid body measures 2 cm. This is increased in size when compared to prior evaluations. Unchanged calcifications within the region of the pancreatic head.  Normal bilateral adrenal glands.  Multiple bilateral low-attenuation renal lesions, too small to accurately characterize. Note is made of a simple cyst off the inferior pole of the right kidney measuring 1.5 cm and a simple cyst in the interpolar region of the left kidney measuring 2.4 cm.  Additionally, off the superior pole of the left kidney there is a 1.6 cm renal lesion with an internal density of 60 Hounsfield units (image 78; series 7).  Normal caliber abdominal aorta. Multiple upper abdominal collateral vessels are demonstrated, likely secondary to portal venous hypertension. No retroperitoneal lymphadenopathy. Small fat containing  periumbilical hernia. No retroperitoneal lymphadenopathy. Nonspecific soft tissue density material within the right inguinal region.  Streak artifact limits evaluation of the pelvis. Urinary bladder is distended. Sigmoid colonic diverticulosis without CT evidence for acute diverticulitis. Normal appendix. No evidence for bowel obstruction. No free fluid or free intraperitoneal air.  Postsurgical change within the proximal femurs bilaterally. Extensive lower lumbar spine degenerative change. L1 and L2 vertebral body compression deformities. No aggressive or acute appearing osseous lesions.  IMPRESSION: 1. Hyper attenuating lesion off the superior aspect of the left kidney may represent a solid renal mass or hemorrhagic cyst. This needs dedicated evaluation with either pre and postcontrast CT enhanced imaging or MRI. While the lesion  may have potentially been present on prior examinations, the lack of coronal and sagittal imaging reformats and the location of the lesion, may comparison difficult. 2. Large right pleural effusion. Consolidative opacities left lower lobe favored to represent atelectasis. Infection could have a similar appearance. Recommend continued radiographic followup to ensure resolution. 3. Nodular contour of the liver with multiple upper abdominal portal venous collateral vessels, suggestive of cirrhosis and portal venous hypertension. 4. Multiple cystic lesions within the pancreas, largest in the midbody measuring 2 cm, increased in size from prior exam 11/11/2009. In the non-acute setting, recommend further characterization with MRI. 5. Nonspecific soft tissue within the right inguinal region, recommend clinical correlation for the possibility of retracted testicle. 6. Re- demonstrated marked compression deformity of the L1 and L2 vertebral bodies. While they are age-indeterminate, the L1 vertebral compression deformity may be acute. These results will be called to the ordering clinician or  representative by the Radiologist Assistant, and communication documented in the PACS or zVision Dashboard.   Electronically Signed   By: Annia Belt M.D.   On: 07/26/2014 14:36   Ir Gastrostomy Tube Mod Sed  07/30/2014   CLINICAL DATA:  Dysphagia, needs enteral feeding support  EXAM: PERC PLACEMENT GASTROSTOMY  FLUOROSCOPY TIME:  1 min 12 seconds  TECHNIQUE: The procedure, risks, benefits, and alternatives were explained to the patient. Questions regarding the procedure were encouraged and answered. The patient understands and consents to the procedure. As antibiotic prophylaxis, cefazolin 2 g IV was ordered pre-procedure and administered intravenously within one hour of incision.Progression of previously administered oral barium into the colon was confirmed fluoroscopically. A 5 French angiographic catheter was placed as orogastric tube. The upper abdomen was prepped with Betadine, draped in usual sterile fashion, and infiltrated locally with 1% lidocaine.  Intravenous Fentanyl and Versed were administered as conscious sedation during continuous cardiorespiratory monitoring by the radiology RN, with a total moderate sedation time of 5 minutes.  Stomach was insufflated using air through the orogastric tube. An 105 French sheath needle was advanced percutaneously into the gastric lumen under fluoroscopy. Gas could be aspirated and a small contrast injection confirmed intraluminal spread. The sheath was exchanged over a guidewire for a 9 [place] vascular sheath, through which the snare device was advanced and used to snare a guidewire passed through the orogastric tube. This was withdrawn, and the snare attached to the 20 French pull-through gastrostomy tube, which was advanced antegrade, positioned with the internal bumper securing the anterior gastric wall to the anterior abdominal wall. Small contrast injection confirms appropriate positioning. The external bumper was applied and the catheter was flushed. No  immediate complication.  IMPRESSION: 1. Technically successful 20 French pull-through gastrostomy placement under fluoroscopy.   Electronically Signed   By: Oley Balm M.D.   On: 07/30/2014 16:53   Dg Op Swallowing Func-medicare/speech Path  07/23/2014   CLINICAL DATA:  Chronic, recurrent right pleural effusion. Confusion. Dysphagia.  EXAM: MODIFIED BARIUM SWALLOW  TECHNIQUE: Different consistencies of barium were administered orally to the patient by the Speech Pathologist. Imaging of the pharynx was performed in the lateral projection.  FLUOROSCOPY TIME:  1 min, 29 seconds.  COMPARISON:  None.  FINDINGS: Thin liquid- administered on a spoon with chin-tuck and with the head turned to the left. There was silent aspiration with both swallows and retention of barium.  Nectar thick liquid- administered with a spoon. There was premature spill and silent aspiration. Retention of barium is identified.  Honey- deferred.  Puree- deferred.  Cracker-deferred.  Puree with cracker- deferred.  Barium tablet -  deferred.  IMPRESSION: As above.  Please refer to the Speech Pathologists report for complete details and recommendations.  Insert swallowing function Please select appropriate template.   Electronically Signed   By: Drusilla Kanner M.D.   On: 07/23/2014 14:25   Mr Maxine Glenn Head/brain Wo Cm  07/24/2014   CLINICAL DATA:  Initial evaluation for a dysphagia, dysarthria a. question stroke.  EXAM: MRI HEAD WITHOUT CONTRAST  MRA HEAD WITHOUT CONTRAST  TECHNIQUE: Multiplanar, multiecho pulse sequences of the brain and surrounding structures were obtained without intravenous contrast. Angiographic images of the head were obtained using MRA technique without contrast.  COMPARISON:  None.  Prior CT performed earlier the same day as well as previous MRI from 05/21/2014.  FINDINGS: MRI HEAD FINDINGS  Diffuse prominence of the CSF containing spaces is compatible with moderate generalized cerebral atrophy. Patchy T2/FLAIR  hyperintensity within the periventricular and deep white matter most consistent with mild chronic small vessel ischemic disease.  No focal parenchymal signal abnormality is identified. No mass lesion, midline shift, or extra-axial fluid collection. Ventricles are normal in size without evidence of hydrocephalus.  No diffusion-weighted signal abnormality is identified to suggest acute intracranial infarct. Gray-white matter differentiation is maintained. Normal flow voids are seen within the intracranial vasculature. No intracranial hemorrhage identified. Susceptibility artifact within the frontal lobes bilaterally again seen, compatible with remote blood products, perhaps related to remote trauma.  The cervicomedullary junction is normal. Pituitary gland is within normal limits. Pituitary stalk is midline. The globes and optic nerves demonstrate a normal appearance with normal signal intensity.  The bone marrow signal intensity is normal. Calvarium is intact. Visualized upper cervical spine is within normal limits.  Scalp soft tissues are unremarkable.  Paranasal sinuses are clear.  No mastoid effusion.  MRA HEAD FINDINGS  ANTERIOR CIRCULATION:  The visualized portions of the distal cervical internal carotid arteries are widely patent with antegrade flow. The petrous, cavernous, and supra clinoid segments are symmetric in caliber bilaterally. A1 segments, anterior communicating artery, and anterior cerebral arteries are widely patent. Middle cerebral arteries are widely patent with antegrade flow without high-grade flow-limiting stenosis or proximal branch occlusion. Distal MCA branches are symmetric bilaterally. No intracranial aneurysm within the anterior circulation.  POSTERIOR CIRCULATION:  The left vertebral artery is dominant. Vertebral arteries are widely patent with antegrade flow. Posterior inferior cerebral arteries patent. Vertebrobasilar junction within normal limits. Basilar artery is mildly tortuous  but widely patent. No basilar tip stenosis or aneurysm. Posterior cerebral arteries well opacified bilaterally. Superior cerebellar arteries are patent.  IMPRESSION: MRI HEAD IMPRESSION:  1. No acute intracranial infarct or other abnormality identified. 2. Moderate atrophy with chronic small vessel ischemic disease.  MRA HEAD IMPRESSION:  Normal MRA of the intracranial circulation with no proximal branch occlusion or hemodynamically significant stenosis identified.   Electronically Signed   By: Rise Mu M.D.   On: 07/24/2014 23:10   US Thoracentesis Asp Pleural Space W/img Guide  07/21/2014   INDICATION: Symptomatic right sided pleural effusion  EXAM: US THORACENTESIS ASP PLEURAL SPACE W/IMG GUIDE  COMPARISON:  Previous thoracentesis  MEDICATIONS: 10 cc 1% lidocaine  COMPLICATIONS: None immediate  TECHNIQUE: Informed written consent was obtained from the patient after a discussion of the risks, benefits and alternatives to treatment. A timeout was performed prior to the initiation of the procedure.  Initial ultrasound scanning demonstrates a right pleural effusion. The lower chest was prepped and draped in the usual  sterile fashion. 1% lidocaine was used for local anesthesia.  Under direct ultrasound guidance, a 19 gauge, 7-cm, Yueh catheter was introduced. An ultrasound image was saved for documentation purposes. the thoracentesis was performed. The catheter was removed and a dressing was applied. The patient tolerated the procedure well without immediate post procedural complication. The patient was escorted to have an upright chest radiograph.  FINDINGS: A total of approximately 1.5 liters of yellow fluid was removed. Requested samples were sent to the laboratory.  IMPRESSION: Successful ultrasound-guided right sided thoracentesis yielding 1.5 liters of pleural fluid.  Read by::  Robet Leu [facility]   Electronically Signed   By: Oley Balm M.D.   On: 07/21/2014 11:47    Microbiology: No  results found for this or any previous visit (from the past 240 hour(s)).   Labs: Basic Metabolic Panel:  Recent Labs Lab 07/27/14 0718 07/28/14 0805  NA 151* 145  K 4.3 4.3  CL 114* 113*  CO2 23 21  GLUCOSE 120* 147*  BUN 26* 17  CREATININE 1.24 1.00  CALCIUM 9.2 9.0   Liver Function Tests: No results for input(s): AST, ALT, ALKPHOS, BILITOT, PROT, ALBUMIN in the last 168 hours. No results for input(s): LIPASE, AMYLASE in the last 168 hours. No results for input(s): AMMONIA in the last 168 hours. CBC: No results for input(s): WBC, NEUTROABS, HGB, HCT, MCV, PLT in the last 168 hours. Cardiac Enzymes: No results for input(s): CKTOTAL, CKMB, CKMBINDEX, TROPONINI in the last 168 hours. BNP: BNP (last 3 results)  Recent Labs  12/27/13 0950 05/20/14 1728 07/20/14 1130  PROBNP 347.2 283.4 383.1   CBG:  Recent Labs Lab 07/31/14 1615 07/31/14 1949 07/31/14 2315 08/01/14 0413 08/01/14 0732  GLUCAP 105* 101* 121* 125* 111*       Signed:  Penny Pia  [facility] 08/01/2014, 11:25 AM

## 2014-08-01 NOTE — Progress Notes (Addendum)
NUTRITION FOLLOW UP  Pt meets criteria for severe MALNUTRITION in the context of chronic illness as evidenced by severe fat and muscle wasting and reported weight loss of 29% in <1 year.  Intervention:   Initiate bolus feedings of 237 ml of Jevity 1.2 6 times daily.    Tube feeding regimen provides 1710 kcal (100% of needs), 79 grams of protein, and 1146 ml of H2O.   Recommend 120 ml flush 4 times per day to provide additional 480 ml fluid (1626 ml total fluid with TF regimen).   Nutrition Dx:   Inadequate oral intake related to inability to eat as evidenced by NPO; ongoing   Goal:   Pt to meet >/= 90% of their estimated nutrition needs   Monitor:   Weight trend, TF management and tolerance, labs, weight changes, I/O's  Assessment:   80 y.o. male, with a pmh of recurrent exudative effusion (which will not be worked up per patient and patient's family), CHF, hip replacement x 2 and BPH. He was sent as a direct admit from [facility] for possible PEG tube placement. [name] is quite coherent, but has a supportive family and his daughter gives much of the history. [name] developed dysphonia "fat tongued speech" on thanksgiving when he choked on iced tea. His symptoms worsened again on Saturday 11/28 when he again choked on liquids. At this point he is unable to take medications, eat solids or liquids without choking. Per daughter, this has happened to [name] several times in the past - when he has developed a UTI or when his pleural effusion has become large and needs to be drained. Each time the dysphonia and dysphagia improved when the patient was treated for the underlying problem. [name] underwent Speech Eval outpatient 12/2. Results showed a high risk for aspiration.  Pt s/p PEG placement on 07/30/14. Speech evaluation reveals pt is at high risk for aspiration and recommends nutrition vial alternative means.  Spoke with pt who reports he is  tolerating feedings well and is very anxious to go home. He shares that his family is seeking instruction on PEG feedings; assured pt he would received all necessary instructions at discharge.  Jevity 1.2 currently running @ 40 ml/hr, which provides 1152 kcals and 53 grams protein, which meets 72% of minimum estimated energy needs and 76% of estimated protein needs.  Gastric residuals 60-110. Received page from Case Manager who is requesting bolus feeding recommendations for discharge. RD will modify order for bolus feeds.   Labs reviewed. Cl: 113, Glucose: 147, CBGS: 111-121.   Height: Ht Readings from Last 1 Encounters:  07/25/14 5\' 3"  (1.6 m)    Weight Status:   Wt Readings from Last 1 Encounters:  08/01/14 118 lb 4.8 oz (53.661 kg)   Wt Readings from Last 1 Encounters:  07/25/14 113 lb (51.256 kg)   Re-estimated needs:  Kcal: 1600-1800 Protein: 70-80 grams Fluid: > 1.6 L daily  Skin: stage II pressure ulcer on sacrum  Diet Order:  NPO   Intake/Output Summary (Last 24 hours) at 08/01/14 1052 Last data filed at 08/01/14 0835  Gross per 24 hour  Intake     60 ml  Output    475 ml  Net   -415 ml    Last BM: 07/29/14   Labs:   Recent Labs Lab 07/27/14 0718 07/28/14 0805  NA 151* 145  K 4.3 4.3  CL 114* 113*  CO2 23 21  BUN 26* 17  CREATININE  1.24 1.00  CALCIUM 9.2 9.0  GLUCOSE 120* 147*    CBG (last 3)   Recent Labs  07/31/14 2315 08/01/14 0413 08/01/14 0732  GLUCAP 121* 125* 111*    Scheduled Meds: . antiseptic oral rinse  7 mL Mouth Rinse q12n4p  . chlorhexidine  15 mL Mouth Rinse BID    Continuous Infusions: . feeding supplement (JEVITY 1.2 CAL) 1,000 mL (08/01/14 0355)    Kasch Borquez A. Mayford KnifeWilliams, RD, LDN Pager: [phone] After hours Pager: [phone]

## 2014-08-01 NOTE — Progress Notes (Signed)
Received page from RN. Pt family had multiple questions about TF regimen. They are concerned about frequency of TF, as they would like to avoid having to wake in the middle of the night to feed pt.  Spoke with Joaquin CourtsKimberly Harris, RD, LDN, CNSC, regarding bolus feeding regimen. She reports in her experience, patient on bolus feedings are able to tolerate 1.5-2 can volume without difficulty.  [name], family member, to relay this information to her, as pt was already discharged.  They were grateful for information and encouraged them to contact home health agency for additional questions.  Madlynn Lundeen A. Mayford KnifeWilliams, RD, LDN Pager: [phone] After hours Pager: [phone]

## 2014-08-01 NOTE — Plan of Care (Signed)
Problem: Phase III Progression Outcomes Goal: Pain controlled on oral analgesia Outcome: Completed/Met Date Met:  08/01/14 Goal: Activity at appropriate level-compared to baseline (UP IN CHAIR FOR HEMODIALYSIS)  Outcome: Completed/Met Date Met:  08/01/14 Goal: Voiding independently Outcome: Progressing Goal: IV/normal saline lock discontinued Outcome: Progressing Goal: Foley discontinued Outcome: Completed/Met Date Met:  08/01/14 Goal: Discharge plan remains appropriate-arrangements made Outcome: Progressing

## 2014-08-08 ENCOUNTER — Ambulatory Visit (INDEPENDENT_AMBULATORY_CARE_PROVIDER_SITE_OTHER): Payer: Medicare Other | Admitting: Internal Medicine

## 2014-08-08 ENCOUNTER — Telehealth: Payer: Self-pay

## 2014-08-08 ENCOUNTER — Encounter: Payer: Self-pay | Admitting: Internal Medicine

## 2014-08-08 VITALS — BP 92/58 | HR 86 | Temp 97.6°F | Resp 10 | Ht 63.0 in | Wt 117.0 lb

## 2014-08-08 DIAGNOSIS — G7000 Myasthenia gravis without (acute) exacerbation: Secondary | ICD-10-CM

## 2014-08-08 DIAGNOSIS — I10 Essential (primary) hypertension: Secondary | ICD-10-CM

## 2014-08-08 DIAGNOSIS — H02102 Unspecified ectropion of right lower eyelid: Secondary | ICD-10-CM

## 2014-08-08 DIAGNOSIS — N400 Enlarged prostate without lower urinary tract symptoms: Secondary | ICD-10-CM

## 2014-08-08 DIAGNOSIS — J90 Pleural effusion, not elsewhere classified: Secondary | ICD-10-CM

## 2014-08-08 DIAGNOSIS — R1310 Dysphagia, unspecified: Secondary | ICD-10-CM

## 2014-08-08 DIAGNOSIS — R490 Dysphonia: Secondary | ICD-10-CM

## 2014-08-08 DIAGNOSIS — I951 Orthostatic hypotension: Secondary | ICD-10-CM

## 2014-08-08 MED ORDER — PYRIDOSTIGMINE BROMIDE 60 MG PO TABS: 30.0000 mg | ORAL_TABLET | Freq: Three times a day (TID) | ORAL | Status: DC

## 2014-08-08 NOTE — Telephone Encounter (Signed)
Spoke with patient's daughter [name], per Dr.Reed patient to start Pyridostigmine Bromide 30 mh po TID with meals (rx already sent to pharmacy). Patient to monitor B/P and call if continual low B/P readings. Patient was to contact Urologist to discuss changing Rapaflo. Per [name] she is waiting for returned call from urology office.

## 2014-08-08 NOTE — Progress Notes (Signed)
[name], male   DOB: Jun 14, 1925, 80 y.o.   MRN: [id number]   Location:  [facility]  Code Status: need to discuss this again.    Allergies  Allergen Reactions  . Sertraline Nausea Only  . Nsaids Other (See Comments)    history of renal failure    Chief Complaint  Patient presents with  . Follow-up    6 week follow-up, seen in ER on 07/24/14 (check feeding tube placement) , discuss low b/p concerns   . Urinary Frequency    Urinary frequency at night about 32 oz (collecting in urinal). Denies pain, discomfort, or burning    HPI: Patient is a 80 y.o. white male seen in the office today for hospital f/u.  Had been under a lot of stress after going to [place] then [place].  May have brought on flare of myasthenia.  In past, only happened with thin liquids when trying to swallow.     Feels good at present.    Since here last, came back 07/22/14 b/c he'd been to the ED on 11/29 and he couldn't talk right--gets slurred or thick-tongued.  Had fluid around his right lung (3rd episode in the year).  Monday after he had fluid drained off.  Got to talking real nasally--could not swallow.  Had not eaten for 3 days.  Saw Dr. Chilton SiGreen.  ENT referral done.  Went to pulmonary Dr. Vassie LollAlva and he was tryign to determine where the fluid was coming from.  Did not get to Dr. Pearlean BrownieSethi.  Dr. Reginia NaasAlva's office called back and  Asked if he was eating.  Went to hospital that Thurs 07/24/14.  Stayed until 12/11.  No cancer, stroke identified.  Dr. Pearlean BrownieSethi thought perhaps he had myasthenia gravis, but test results were not back yet.  Is to f/u with neurology and they would give results.  Appt isn't until 10/20/13.  [facility] which showed severe dysphagia.  Did have feeding tube placed also during hospitalization.  Has PT, OT, ST.    BPs have been running low.  Takes rapaflo--held in hospital.  Last Friday, he and his wife moved in with his daughter and her  husband.  Restarted the rapaflo 2 days ago.  BP got much lower and he started more urination at night--32 oz at night.  Feet had been very swollen and tight before the rapaflo--after he had it one night, they got a bit better.  Did have rapaflo yesterday, but not yet this am.  He is getting at least 48 oz water through tube daily and 3 cans per day of tube feeding.  Got 4 cans on the 15th.  16th 3 can.  17th 4 cans and 64oz water.  Getting tylenol liquid through tube 4x per day.    Review of Systems:  Review of Systems  Constitutional: Positive for malaise/fatigue. Negative for fever and chills.  HENT: Negative for congestion.   Eyes: Positive for redness.       Drooping lids; dry eyes  Respiratory: Positive for shortness of breath.   Cardiovascular: Positive for leg swelling. Negative for chest pain.  Gastrointestinal: Negative for abdominal pain, constipation, blood in stool and melena.  Genitourinary: Positive for urgency and frequency. Negative for dysuria.  Musculoskeletal: Negative for falls.  Skin: Negative for rash.  Neurological: Positive for dizziness and weakness.  Endo/Heme/Allergies: Does not bruise/bleed easily.  Psychiatric/Behavioral: Positive for memory loss. Negative for depression.  Past Medical History  Diagnosis Date  . Gout   . Insomnia   . BPH (benign prostatic hyperplasia)   . Kidney disorder   . Arthritis   . Hypertension     patient denies on 10/25/12 on no meds   . Complication of anesthesia     hard to wake up -2011 after hernia surgery   . Anemia   . CHF (congestive heart failure)   . Prostate disorder   . Hernia, abdominal     Past Surgical History  Procedure Laterality Date  . Cataract extraction    . Cholecystectomy    . Hernia repair  02/22/11    right  . Joint replacement      fractured hip  . Total hip arthroplasty Right 10/29/2012    Procedure: CONVERSION OF PREVIOUS HIP SURGERY TO RIGHT TOTAL HIP AND REMOVAL OF IM NAIL ;  Surgeon:  Shelda PalMatthew D Olin, MD;  Location: [facility];  Service: Orthopedics;  Laterality: Right;  . Esophagogastroduodenoscopy N/A 11/16/2012    Procedure: ESOPHAGOGASTRODUODENOSCOPY (EGD);  Surgeon: Theda BelfastPatrick D Hung, MD;  [facility];  Service: Endoscopy;  Laterality: N/A;  . Intramedullary (im) nail intertrochanteric Left 10/17/2013    Procedure: INTRAMEDULLARY (IM) NAIL INTERTROCHANTRIC;  Surgeon: Shelda PalMatthew D Olin, MD;  Location: [facility];  Service: Orthopedics;  Laterality: Left;  . Esophagogastroduodenoscopy N/A 07/27/2014    Procedure: ESOPHAGOGASTRODUODENOSCOPY (EGD);  Surgeon: Graylin ShiverSalem F Ganem, MD;  [facility];  Service: Endoscopy;  Laterality: N/A;    Social History:   reports that he has never smoked. He has never used smokeless tobacco. He reports that he does not drink alcohol or use illicit drugs.  Family History  Problem Relation Age of Onset  . Heart failure Mother     heart  . Heart failure Father     fell and hit head  . Cancer Brother     colon  . Diabetes Daughter     Medications: Patient's Medications  New Prescriptions   No medications on file  Previous Medications   ACETAMINOPHEN (TYLENOL) 167 MG/5ML LIQD    Take by mouth. 500 mg every 4 hours while awake via feeding tube   NAPHAZOLINE-GLYCERIN (CLEAR EYES) 0.012-0.2 % SOLN    Place 1-2 drops into both eyes every 4 (four) hours as needed for irritation.   NUTRITIONAL SUPPLEMENTS (FEEDING SUPPLEMENT, JEVITY 1.2 CAL,) LIQD    Place 1,000 mLs into feeding tube continuous.   SILODOSIN (RAPAFLO) 8 MG CAPS CAPSULE    Place 1 capsule (8 mg total) into feeding tube daily with breakfast.  Modified Medications   No medications on file  Discontinued Medications   ACETAMINOPHEN (TYLENOL) 325 MG TABLET    Place 2 tablets (650 mg total) into feeding tube every 4 (four) hours as needed for mild pain (or temp >/= 99.5 F).     Physical Exam: Filed Vitals:   08/08/14 1040  BP: 92/58  Pulse: 86  Temp: 97.6 F (36.4 C)    TempSrc: Oral  Resp: 10  Height: 5\' 3"  (1.6 m)  Weight: 117 lb (53.071 kg)  SpO2: 99%  Physical Exam  Constitutional: He is oriented to person, place, and time.  Frail white male in nad  HENT:  Head: Normocephalic and atraumatic.  Eyes:  Ectropion bilateral eyes  Cardiovascular: Normal rate, regular rhythm, normal heart sounds and intact distal pulses.   1+ pitting edema bilateral ankles (improved from prior visit)  Pulmonary/Chest: Effort normal.  diminished breath sounds on right, left  clear  Abdominal: Soft. Bowel sounds are normal. He exhibits no distension and no mass. There is no tenderness.  PEG in place with very little residual serosanguinous drainage present  Musculoskeletal: Normal range of motion.  Proximal LE weakness  Neurological: He is alert and oriented to person, place, and time.  Skin: Skin is warm and dry.  Psychiatric: He has a normal mood and affect.    Labs reviewed: Basic Metabolic Panel:  Recent Labs  82/95/62 0356  05/22/14 0433  07/24/14 1500 07/25/14 0453 07/27/14 0718 07/28/14 0805  NA 140  < > 141  < > 149* 154* 151* 145  K 5.6*  < > 4.3  < > 4.3 4.3 4.3 4.3  CL 108  < > 106  < > 110 115* 114* 113*  CO2 21  < > 23  < > 23 20 23 21   GLUCOSE 150*  < > 82  < > 100* 71 120* 147*  BUN 31*  < > 24*  < > 42* 38* 26* 17  CREATININE 1.29  < > 1.22  < > 1.44* 1.35 1.24 1.00  CALCIUM 8.4  < > 8.9  < > 9.7 9.3 9.2 9.0  MG 2.0  --   --   --  2.4  --   --   --   PHOS 4.6  --   --   --  3.1 3.4  --   --   TSH  --   --  3.670  --   --   --   --   --   < > = values in this interval not displayed. Liver Function Tests:  Recent Labs  07/20/14 1130 07/24/14 1500 07/25/14 0453  AST 23 29 27   ALT 12 15 14   ALKPHOS 131* 112 101  BILITOT 1.4* 1.0 0.9  PROT 6.2 6.1 5.7*  ALBUMIN 3.3* 3.5 3.2*   No results for input(s): LIPASE, AMYLASE in the last 8760 hours.  Recent Labs  05/22/14 0433  AMMONIA 25   CBC:  Recent Labs  03/03/14 2135  05/20/14 1728 05/21/14 0432 07/20/14 1130 07/25/14 0453  WBC 13.8* 5.6 4.4 5.4 5.9  NEUTROABS 12.4* 3.8  --  4.2  --   HGB 13.9 12.1* 11.9* 13.7 13.0  HCT 41.7 36.7* 36.0* 40.5 40.6  MCV 91.6 94.3 93.0 93.5 95.3  PLT 124* 110* 115* 128* 152   Lipid Panel:  Recent Labs  07/24/14 1500  CHOL 169  HDL 51  LDLCALC 98  TRIG 102  CHOLHDL 3.3   Lab Results  Component Value Date   HGBA1C 5.4 07/24/2014  acetylcholine receptor ab 17.20 Acetylcholine binding ab 12.86 Acetylcholine modular ab 94 Acetyl block ab 72 Striated muscle ab <1:40  Assessment/Plan 1. Myasthenia gravis, AChR antibody positive -striated muscle ab negative -will copy Dr. Pearlean Brownie on this note as I am starting treatment for his myasthenia--may need to add glycopyrrolate if he has difficulty with cholinergic side effects -unfortunately does have prostate difficulties and already has hypotension so this is the biggest concern -I will leave it up to neurology to start any immune modulating medications - SLP modified barium swallow; Future - pyridostigmine (MESTINON) 60 MG tablet; Take 0.5 tablets (30 mg total) by mouth 3 (three) times daily.  Dispense: 90 tablet; Refill: 3  2. Dysphagia - due to #1 -ST at home has requested a f/u barium swallow--at this time he is NPO and receiving tube feeding due to severe dysphagia on last assessment -  SLP modified barium swallow; Future  3. Ectropion of right lower eyelid -may truly be due to #1 also, seems both lids are affected  4. Essential hypertension -actually having very low bps and not on therapy except for prostate at present  5. Recurrent right pleural effusion -exudative -cause of this was still pending--has f/u with Dr. Vassie Loll at pulmonary next month  6. Dysphonia - due to #1 - SLP modified barium swallow; Future  7. BPH (benign prostatic hyperplasia) -I have asked Mr. Lipsett daughter to check with Dr. Brunilda Payor if he is comfortable trying him back on  flomax in place of rapaflo at this point--last time his pleural effusion and urinary retention worsened, but currently he is urinating way too much and having significant hypotension down to 80/60 at home while taking the rapaflo  8. Orthostatic hypotension -related to rapaflo -see #7  Labs/tests ordered:  Keep appts with Dr. Vassie Loll and Dr. Pearlean Brownie upcoming  Next appt:  F/u with Shanda Bumps in 1 month  Breken Nazari L. Chelsie Burel, D.O. Geriatrics [facility] [address], [place] [address] Cell Phone (Mon-Fri 8am-5pm):  [phone] On Call:  [phone] & follow prompts after 5pm & weekends Office Phone:  [phone] Office Fax:  [phone]

## 2014-08-11 ENCOUNTER — Other Ambulatory Visit (HOSPITAL_COMMUNITY): Payer: Self-pay | Admitting: Internal Medicine

## 2014-08-11 ENCOUNTER — Telehealth: Payer: Self-pay | Admitting: *Deleted

## 2014-08-11 DIAGNOSIS — G7000 Myasthenia gravis without (acute) exacerbation: Secondary | ICD-10-CM

## 2014-08-11 DIAGNOSIS — R1310 Dysphagia, unspecified: Secondary | ICD-10-CM

## 2014-08-11 NOTE — Telephone Encounter (Signed)
Patient daughter, [name] called and stated that they want a referral to [facility] for Myasthenia Gravis. Please Advise.

## 2014-08-11 NOTE — Telephone Encounter (Signed)
Neurology referral to [facility] is ok.  It will probably take even longer to get in there if she is hoping he will get seen sooner than 3/1.  Is he tolerating the new medication?

## 2014-08-12 NOTE — Telephone Encounter (Signed)
Jordan CampanileSandy stated that patient stated that he was feeling the best he has ever felt. Medication is working well and very pleased.  Placed referral to [facility] in system.

## 2014-08-14 ENCOUNTER — Telehealth: Payer: Self-pay

## 2014-08-14 ENCOUNTER — Ambulatory Visit (HOSPITAL_COMMUNITY)
Admission: RE | Admit: 2014-08-14 | Discharge: 2014-08-14 | Disposition: A | Discharge status: Home / Self Care | Payer: Medicare Other | Source: Ambulatory Visit | Attending: Internal Medicine | Admitting: Internal Medicine

## 2014-08-14 DIAGNOSIS — Z931 Gastrostomy status: Secondary | ICD-10-CM | POA: Insufficient documentation

## 2014-08-14 DIAGNOSIS — R1313 Dysphagia, pharyngeal phase: Secondary | ICD-10-CM | POA: Insufficient documentation

## 2014-08-14 DIAGNOSIS — K209 Esophagitis, unspecified: Secondary | ICD-10-CM | POA: Insufficient documentation

## 2014-08-14 DIAGNOSIS — K449 Diaphragmatic hernia without obstruction or gangrene: Secondary | ICD-10-CM | POA: Diagnosis not present

## 2014-08-14 DIAGNOSIS — K222 Esophageal obstruction: Secondary | ICD-10-CM | POA: Insufficient documentation

## 2014-08-14 DIAGNOSIS — G7000 Myasthenia gravis without (acute) exacerbation: Secondary | ICD-10-CM | POA: Insufficient documentation

## 2014-08-14 DIAGNOSIS — R1310 Dysphagia, unspecified: Secondary | ICD-10-CM

## 2014-08-14 DIAGNOSIS — R490 Dysphonia: Secondary | ICD-10-CM

## 2014-08-14 NOTE — Procedures (Signed)
Objective Swallowing Evaluation: Modified Barium Swallowing Study  Patient Details  Name: Audrie Gallusarl L Alia MRN: [id number] Date of Birth: October 05, 1924  Today's Date: 08/14/2014 Time: [id number] SLP Time Calculation (min) (ACUTE ONLY): 45 min  Past Medical History:  Past Medical History  Diagnosis Date  . Gout   . Insomnia   . BPH (benign prostatic hyperplasia)   . Kidney disorder   . Arthritis   . Hypertension     patient denies on 10/25/12 on no meds   . Complication of anesthesia     hard to wake up -2011 after hernia surgery   . Anemia   . CHF (congestive heart failure)   . Prostate disorder   . Hernia, abdominal    Past Surgical History:  Past Surgical History  Procedure Laterality Date  . Cataract extraction    . Cholecystectomy    . Hernia repair  02/22/11    right  . Joint replacement      fractured hip  . Total hip arthroplasty Right 10/29/2012    Procedure: CONVERSION OF PREVIOUS HIP SURGERY TO RIGHT TOTAL HIP AND REMOVAL OF IM NAIL ;  Surgeon: Shelda PalMatthew D Olin, MD;  Location: [facility];  Service: Orthopedics;  Laterality: Right;  . Esophagogastroduodenoscopy N/A 11/16/2012    Procedure: ESOPHAGOGASTRODUODENOSCOPY (EGD);  Surgeon: Theda BelfastPatrick D Hung, MD;  [facility];  Service: Endoscopy;  Laterality: N/A;  . Intramedullary (im) nail intertrochanteric Left 10/17/2013    Procedure: INTRAMEDULLARY (IM) NAIL INTERTROCHANTRIC;  Surgeon: Shelda PalMatthew D Olin, MD;  Location: [facility];  Service: Orthopedics;  Laterality: Left;  . Esophagogastroduodenoscopy N/A 07/27/2014    Procedure: ESOPHAGOGASTRODUODENOSCOPY (EGD);  Surgeon: Graylin ShiverSalem F Ganem, MD;  [facility];  Service: Endoscopy;  Laterality: N/A;   HPI:  80 yr old [facility] by his daughter. Pt experienced abrupt dysphagia around Thanksgiving 2015 with MBS revealing a high risk for aspiration with moderate oral phase dysphagia; severe pharyngeal phase dysphagia; severe cervical  esophageal phase dysphagia.  EGD 10/21/2012 completed by Dr Elnoria HowardHung showed 2 cm hiatal hernia, mild esophagitis and Schtazki's ring.  Pt is NPO with PEG placement12/9.  Diagnosis of Myasthenia Gravis was confirmed and pt has received home health ST services.  MBS today for reassessment of current swallow abilities and safety with po's.      Assessment / Plan / Recommendation Clinical Impression  Dysphagia Diagnosis: Moderate pharyngeal phase dysphagia (suspected esophageal ) Clinical impression: Pt's swallow function has significantly improved from previous MBS. He exhibits a moderate pharyngeal dysphagia with a primary motor component.  Decreased laryngeal elevation and larygneal closure led to laryngeal penetration and silent aspiration with thin cup sips barium. Therapeutic intervention included chin tuck strategy which was ineffective to protect laryngeal vestibule/trachea. Mild-moderate vallecular residue due to reduced tongue base retraction with effective second swallows (cues) to significantly decrease residue. Esophagus scanned with puree and barium pill with evidence of what appears to be significant stasis from distal to thoracic esophagus.  Nectar liquids were moderately effective in reducing residue. Pt without esophageal globus sensation when questioned by SLP.  SLP recommends initiating a Dysphagia 2 texture, nectar thick liquids, no straws, 2 swallows after bites/sips (or every other), intermittent cough/throat clear, alternate liquids and solids, pills whole in applesauce, discuss medical treatment of esohagus with MD and esophageal precautions (stay up minimum 1 hour after meals, alternate sips/liquids, attempt warm nectar liquids, eat slowly, smaller and more frequent meals). Provided handouts on all diet levels (1,2 3),  esophageal precautions and information re: Thicken Up Clear.  Advised pt for guidance with MD/dietitian re: PEG feedings (amount, etc). Continue home health ST.     Treatment  Recommendation       Diet Recommendation Dysphagia 2 (Fine chop);Nectar-thick liquid   Liquid Administration via: Cup;No straw Medication Administration: Whole meds with puree Supervision: Patient able to self feed;Full supervision/cueing for compensatory strategies Compensations: Slow rate;Small sips/bites;Multiple dry swallows after each bite/sip;Follow solids with liquid;Clear throat intermittently Postural Changes and/or Swallow Maneuvers: Seated upright 90 degrees;Upright 30-60 min after meal    Other  Recommendations Oral Care Recommendations: Oral care BID   Follow Up Recommendations  Home health SLP    Frequency and Duration        Pertinent Vitals/Pain No pain           Reason for Referral Objectively evaluate swallowing function   Oral Phase Oral Preparation/Oral Phase Oral Phase: WFL   Pharyngeal Phase Pharyngeal Phase Pharyngeal Phase: Impaired Pharyngeal - Honey Pharyngeal - Honey Cup: Not tested Pharyngeal - Nectar Pharyngeal - Nectar Cup: Pharyngeal residue - valleculae;Pharyngeal residue - pyriform sinuses;Reduced laryngeal elevation;Reduced tongue base retraction Pharyngeal - Thin Pharyngeal - Thin Teaspoon: Penetration/Aspiration during swallow;Reduced laryngeal elevation;Reduced airway/laryngeal closure;Pharyngeal residue - valleculae;Reduced tongue base retraction Penetration/Aspiration details (thin teaspoon): Material enters airway, remains ABOVE vocal cords and not ejected out Pharyngeal - Thin Cup: Penetration/Aspiration during swallow;Reduced laryngeal elevation;Reduced airway/laryngeal closure;Pharyngeal residue - valleculae;Reduced tongue base retraction Penetration/Aspiration details (thin cup): Material enters airway, passes BELOW cords without attempt by patient to eject out (silent aspiration) Pharyngeal - Solids Pharyngeal - Puree: Pharyngeal residue - valleculae;Reduced tongue base retraction Pharyngeal - Regular: Pharyngeal residue -  valleculae;Reduced tongue base retraction (min) Pharyngeal - Pill: Within functional limits (stopped mid-lower esophagus)  Cervical Esophageal Phase    GO    Cervical Esophageal Phase Cervical Esophageal Phase: [facility]    Functional Assessment Tool Used: clinical judgement Functional Limitations: Swallowing Swallow Current Status ([id number]): At least 40 percent but less than 60 percent impaired, limited or restricted Swallow Goal Status [phone]): At least 40 percent but less than 60 percent impaired, limited or restricted Swallow Discharge Status [phone]([id number]): At least 40 percent but less than 60 percent impaired, limited or restricted    Royce MacadamiaLitaker, Stanely Sexson Willis 08/14/2014, 2:06 PM  Breck CoonsLisa Willis Lonell FaceLitaker M.Ed [employer]SL[employer] Pager [phone]

## 2014-08-14 NOTE — Telephone Encounter (Signed)
Per Shanda BumpsJessica, NP Xray not indicated at this time, if patient is symptomatic (SOB, more than usual) he needs to seek medical attention at Urgent Care today, otherwise schedule appointment to be seen on Monday.   Spoke with patient's daughter. Patient will see lung specialist next week and she will have Dr.Alva further address request for Chest Xray request. Patient's daughter was being cautious and just trying to be proactive.

## 2014-08-14 NOTE — Telephone Encounter (Signed)
Patient's daughter called requesting order for [facility]. [facility] @ 11:00 am. Patient with history of fluid build up, patient also with SOB at night. Patient with recent diagnosis of Myasthenia Gravis and daughter is being cautious.   Please advise, if ok please place chest xray order for [facility]

## 2014-08-20 ENCOUNTER — Telehealth: Payer: Self-pay | Admitting: *Deleted

## 2014-08-20 NOTE — Telephone Encounter (Signed)
Cathy with [name] called and wanted verbal orders for Speech Therapy. Given.

## 2014-08-26 ENCOUNTER — Encounter: Payer: Self-pay | Admitting: Adult Health

## 2014-08-26 ENCOUNTER — Ambulatory Visit (INDEPENDENT_AMBULATORY_CARE_PROVIDER_SITE_OTHER): Payer: Medicare Other | Admitting: Adult Health

## 2014-08-26 ENCOUNTER — Ambulatory Visit (INDEPENDENT_AMBULATORY_CARE_PROVIDER_SITE_OTHER)
Admission: RE | Admit: 2014-08-26 | Discharge: 2014-08-26 | Disposition: A | Discharge status: Home / Self Care | Payer: Medicare Other | Source: Ambulatory Visit | Attending: Adult Health | Admitting: Adult Health

## 2014-08-26 VITALS — BP 138/62 | HR 83 | Temp 97.6°F | Ht 61.0 in | Wt 117.2 lb

## 2014-08-26 DIAGNOSIS — G7000 Myasthenia gravis without (acute) exacerbation: Secondary | ICD-10-CM

## 2014-08-26 DIAGNOSIS — J90 Pleural effusion, not elsewhere classified: Secondary | ICD-10-CM

## 2014-08-26 DIAGNOSIS — I5032 Chronic diastolic (congestive) heart failure: Secondary | ICD-10-CM

## 2014-08-26 NOTE — Patient Instructions (Addendum)
We are setting you up for a thoracentesis to drain fluid out of right lung.  Follow up Dr. Vassie LollAlva in 4 weeks with chest xray  Follow up with neurology as planned  Please contact office for sooner follow up if symptoms do not improve or worsen or seek emergency care

## 2014-08-27 ENCOUNTER — Ambulatory Visit (HOSPITAL_COMMUNITY)
Admission: RE | Admit: 2014-08-27 | Discharge: 2014-08-27 | Disposition: A | Discharge status: Home / Self Care | Payer: Medicare Other | Source: Ambulatory Visit | Attending: Adult Health | Admitting: Adult Health

## 2014-08-27 ENCOUNTER — Ambulatory Visit (HOSPITAL_COMMUNITY)
Admission: RE | Admit: 2014-08-27 | Discharge: 2014-08-27 | Disposition: A | Discharge status: Home / Self Care | Payer: Medicare Other | Source: Ambulatory Visit | Attending: Radiology | Admitting: Radiology

## 2014-08-27 DIAGNOSIS — J90 Pleural effusion, not elsewhere classified: Secondary | ICD-10-CM | POA: Diagnosis present

## 2014-08-27 DIAGNOSIS — G7000 Myasthenia gravis without (acute) exacerbation: Secondary | ICD-10-CM | POA: Diagnosis not present

## 2014-08-27 DIAGNOSIS — I509 Heart failure, unspecified: Secondary | ICD-10-CM | POA: Insufficient documentation

## 2014-08-27 DIAGNOSIS — N189 Chronic kidney disease, unspecified: Secondary | ICD-10-CM | POA: Diagnosis not present

## 2014-08-27 DIAGNOSIS — Z9889 Other specified postprocedural states: Secondary | ICD-10-CM

## 2014-08-27 NOTE — Procedures (Signed)
US guided therapeutic right thoracentesis performed yielding 1.5 liters (maximum ordered) blood-tinged/amber fluid. F/u CXR pending. No immediate complications.

## 2014-08-28 ENCOUNTER — Telehealth: Payer: Self-pay | Admitting: Adult Health

## 2014-08-28 NOTE — Telephone Encounter (Signed)
Thank you for arranging Spoke to Dr. Everlena CooperJaffe yesterday

## 2014-08-28 NOTE — Telephone Encounter (Signed)
Pt seen by TP 1.5.16 Referral to [facility] was made at the 12.1.15 visit w/ RA but daughter cancelled the 4412.7.15 appt with [name] d/t pt being admitted Pt is scheduled to see [facility] in March 2016  Called [facility] and spoke with [name] - appt rescheduled with [name] for 2.15.16 @ 3pm, no sooner appts.  Did request pt be placed on their cancellation list.  Called spoke with patient's daughter [name] and discussed the above with her.  [name] was happy with the 2.15.16 appt w/ [name].   Will sign and forward to TP and RA as FYI.

## 2014-08-28 NOTE — Telephone Encounter (Signed)
Daughter called back stating the patient can not go to Dr Everlena CooperJaffe on 10-06-14 at 3pm as [facility]. Daughter was given number [phone]) to [facility] and [facility] to another day that works best for them. Will forward to TP and RA as FYI of change in appt date.

## 2014-09-01 ENCOUNTER — Telehealth: Payer: Self-pay

## 2014-09-01 NOTE — Telephone Encounter (Signed)
Jordan MessierKathy (speech therapist) called triage line to inform provider patient has PEG tube but able to start eating by mouth. Patient has no appetite. Jordan MessierKathy would like to get recommendations, questions if PEG tube feeding should be decreased or call in rx for Megace?  Please advise

## 2014-09-01 NOTE — Telephone Encounter (Signed)
Pt has upcoming appt, if this can not wait until then please have pt scheduled for sooner follow up

## 2014-09-01 NOTE — Telephone Encounter (Signed)
I called patient's daughter to offer earlier appointment and [name] indicated there is no need for an earlier appointment. Patient will keep pending appointment for 09/08/14  I called Olegario MessierKathy (speech therapist) and left message informing her of outcome.

## 2014-09-08 ENCOUNTER — Encounter: Payer: Self-pay | Admitting: Nurse Practitioner

## 2014-09-08 ENCOUNTER — Ambulatory Visit (INDEPENDENT_AMBULATORY_CARE_PROVIDER_SITE_OTHER): Payer: Medicare Other | Admitting: Nurse Practitioner

## 2014-09-08 VITALS — BP 110/66 | HR 78 | Temp 97.4°F | Ht 61.0 in | Wt 114.5 lb

## 2014-09-08 DIAGNOSIS — M109 Gout, unspecified: Secondary | ICD-10-CM

## 2014-09-08 DIAGNOSIS — G7000 Myasthenia gravis without (acute) exacerbation: Secondary | ICD-10-CM

## 2014-09-08 DIAGNOSIS — I10 Essential (primary) hypertension: Secondary | ICD-10-CM

## 2014-09-08 DIAGNOSIS — H02102 Unspecified ectropion of right lower eyelid: Secondary | ICD-10-CM

## 2014-09-08 DIAGNOSIS — J90 Pleural effusion, not elsewhere classified: Secondary | ICD-10-CM

## 2014-09-08 DIAGNOSIS — N400 Enlarged prostate without lower urinary tract symptoms: Secondary | ICD-10-CM

## 2014-09-08 DIAGNOSIS — R630 Anorexia: Secondary | ICD-10-CM

## 2014-09-08 DIAGNOSIS — M10069 Idiopathic gout, unspecified knee: Secondary | ICD-10-CM

## 2014-09-08 DIAGNOSIS — R1310 Dysphagia, unspecified: Secondary | ICD-10-CM

## 2014-09-08 MED ORDER — PYRIDOSTIGMINE BROMIDE 60 MG/5ML PO SYRP: 60.0000 mg | ORAL_SOLUTION | Freq: Four times a day (QID) | ORAL | Status: DC

## 2014-09-08 MED ORDER — SILODOSIN 8 MG PO CAPS: 8.0000 mg | ORAL_CAPSULE | Freq: Every day | ORAL | Status: AC

## 2014-09-08 NOTE — Progress Notes (Signed)
[name], male   DOB: 04-03-1925, 81 y.o.   MRN: [id number]    PCP: [name], NP  Allergies  Allergen Reactions  . Sertraline Nausea Only  . Nsaids Other (See Comments)    history of renal failure    Chief Complaint  Patient presents with  . Medical Management of Chronic Issues    1 month follow up, examine feeding tube site  . Medication Management    discuss changing mestinon to a liquid     HPI: Patient is a 81 y.o. male seen in the office today for routine follow up on chronic conditions.  Pt diagnosed with myesthesia gravis. Pt has not seen a neurologist, has 3 appts now. Has appt with neurologist at [facility] and Dr Everlena Cooper.  Question if we can increase mestinon, pt currently taking 0.5 tablet TID, 9 am 1pm 5 pm, if he does not take it as these times daughter starts seeing effects of medication wearing off "thick tongued and drooling" would like to see if she can add to a bedtime dose to help with symptoms. Has done well with medication, no hypotension noted Pt being followed with pulmonary as well. Recently had paracentesis. 1.5L removed.    Currently has PEG, getting feeding through this. Daughter reports he has always been a small eater. Never had an appetite. Daughter feels like everything is going fine with TF. Reports he is sleeping a lot during the day, does not want to wake him up to fed him.  12 oz of water with each feeding.  Daughter does not want appetite stimulant   Review of Systems:  Review of Systems  Constitutional: Positive for fatigue. Negative for fever, chills, activity change, appetite change (never had an appetite) and unexpected weight change.  HENT: Negative for congestion.   Eyes: Positive for redness.       Drooping lids; dry eyes  Respiratory: Negative for shortness of breath.   Cardiovascular: Positive for leg swelling. Negative for chest pain.  Gastrointestinal: Negative for abdominal pain, constipation and blood in  stool.  Genitourinary: Positive for urgency and frequency. Negative for dysuria.  Skin: Negative for rash.  Neurological: Positive for weakness. Negative for dizziness and headaches.  Hematological: Does not bruise/bleed easily.    Past Medical History  Diagnosis Date  . Gout   . Insomnia   . BPH (benign prostatic hyperplasia)   . Kidney disorder   . Arthritis   . Hypertension     patient denies on 10/25/12 on no meds   . Complication of anesthesia     hard to wake up -2011 after hernia surgery   . Anemia   . CHF (congestive heart failure)   . Prostate disorder   . Hernia, abdominal    Past Surgical History  Procedure Laterality Date  . Cataract extraction    . Cholecystectomy    . Hernia repair  02/22/11    right  . Joint replacement      fractured hip  . Total hip arthroplasty Right 10/29/2012    Procedure: CONVERSION OF PREVIOUS HIP SURGERY TO RIGHT TOTAL HIP AND REMOVAL OF IM NAIL ;  Surgeon: Shelda Pal, MD;  Location: [facility];  Service: Orthopedics;  Laterality: Right;  . Esophagogastroduodenoscopy N/A 11/16/2012    Procedure: ESOPHAGOGASTRODUODENOSCOPY (EGD);  Surgeon: Theda Belfast, MD;  [facility];  Service: Endoscopy;  Laterality: N/A;  . Intramedullary (im) nail intertrochanteric Left 10/17/2013    Procedure: INTRAMEDULLARY (IM) NAIL  INTERTROCHANTRIC;  Surgeon: Shelda PalMatthew D Olin, MD;  Location: [facility];  Service: Orthopedics;  Laterality: Left;  . Esophagogastroduodenoscopy N/A 07/27/2014    Procedure: ESOPHAGOGASTRODUODENOSCOPY (EGD);  Surgeon: Graylin ShiverSalem F Ganem, MD;  [facility];  Service: Endoscopy;  Laterality: N/A;   Social History:   reports that he has never smoked. He has never used smokeless tobacco. He reports that he does not drink alcohol or use illicit drugs.  Family History  Problem Relation Age of Onset  . Heart failure Mother     heart  . Heart failure Father     fell and hit head  . Cancer Brother     colon  . Diabetes Daughter      Medications: Patient's Medications  New Prescriptions   No medications on file  Previous Medications   ACETAMINOPHEN (TYLENOL) 167 MG/5ML LIQD    Take by mouth. 500 mg every 4 hours while awake via feeding tube   NAPHAZOLINE-GLYCERIN (CLEAR EYES) 0.012-0.2 % SOLN    Place 1-2 drops into both eyes every 4 (four) hours as needed for irritation.   NUTRITIONAL SUPPLEMENTS (FEEDING SUPPLEMENT, JEVITY 1.2 CAL,) LIQD    Place 1,000 mLs into feeding tube continuous.   PYRIDOSTIGMINE (MESTINON) 60 MG TABLET    Take 0.5 tablets (30 mg total) by mouth 3 (three) times daily.   SILODOSIN (RAPAFLO) 8 MG CAPS CAPSULE    Place 1 capsule (8 mg total) into feeding tube daily with breakfast.  Modified Medications   No medications on file  Discontinued Medications   ALLOPURINOL (ZYLOPRIM) 100 MG TABLET    Take 100 mg by mouth every other day.     Physical Exam:  Filed Vitals:   09/08/14 1308  BP: 110/66  Pulse: 78  Temp: 97.4 F (36.3 C)  TempSrc: Oral  Height: 5\' 1"  (1.549 m)  Weight: 114 lb 8 oz (51.937 kg)  SpO2: 99%    Physical Exam  Constitutional: He is oriented to person, place, and time.  Frail white male in nad  HENT:  Head: Normocephalic and atraumatic.  Eyes:  Ectropion bilateral eyes  Cardiovascular: Normal rate, regular rhythm, normal heart sounds and intact distal pulses.   2+ pitting edema left ankle  Pulmonary/Chest: Effort normal and breath sounds normal.  Abdominal: Soft. Bowel sounds are normal. He exhibits no distension. There is no tenderness.  PEG in place  Musculoskeletal: Normal range of motion.  Proximal LE weakness  Neurological: He is alert and oriented to person, place, and time.  Skin: Skin is warm and dry.  Psychiatric: He has a normal mood and affect.    Labs reviewed: Basic Metabolic Panel:  Recent Labs  96/11/5400/27/15 0356  05/22/14 0433  07/24/14 1500 07/25/14 0453 07/27/14 0718 07/28/14 0805  NA 140  < > 141  < > 149* 154* 151* 145  K 5.6*   < > 4.3  < > 4.3 4.3 4.3 4.3  CL 108  < > 106  < > 110 115* 114* 113*  CO2 21  < > 23  < > 23 20 23 21   GLUCOSE 150*  < > 82  < > 100* 71 120* 147*  BUN 31*  < > 24*  < > 42* 38* 26* 17  CREATININE 1.29  < > 1.22  < > 1.44* 1.35 1.24 1.00  CALCIUM 8.4  < > 8.9  < > 9.7 9.3 9.2 9.0  MG 2.0  --   --   --  2.4  --   --   --  PHOS 4.6  --   --   --  3.1 3.4  --   --   TSH  --   --  3.670  --   --   --   --   --   < > = values in this interval not displayed. Liver Function Tests:  Recent Labs  07/20/14 1130 07/24/14 1500 07/25/14 0453  AST ALT ALKPHOS 131* 112 101  BILITOT 1.4* 1.0 0.9  PROT 6.2 6.1 5.7*  ALBUMIN 3.3* 3.5 3.2*   No results for input(s): LIPASE, AMYLASE in the last 8760 hours.  Recent Labs  05/22/14 0433  AMMONIA 25   CBC:  Recent Labs  03/03/14 2135 05/20/14 1728 05/21/14 0432 07/20/14 1130 07/25/14 0453  WBC 13.8* 5.6 4.4 5.4 5.9  NEUTROABS 12.4* 3.8  --  4.2  --   HGB 13.9 12.1* 11.9* 13.7 13.0  HCT 41.7 36.7* 36.0* 40.5 40.6  MCV 91.6 94.3 93.0 93.5 95.3  PLT 124* 110* 115* 128* 152   Lipid Panel:  Recent Labs  07/24/14 1500  CHOL 169  HDL 51  LDLCALC 98  TRIG 102  CHOLHDL 3.3   TSH:  Recent Labs  05/22/14 0433  TSH 3.670   A1C: Lab Results  Component Value Date   HGBA1C 5.4 07/24/2014     Assessment/Plan  1. Dysphagia conts to be followed by ST, on thicken liquids.  -currently with PEG with feeding and water flushes. Tolerating without difficulty   2. Recurrent right pleural effusion -conts to follow up with pulmonary, follow up chest xray and appt scheduled   3. BPH (benign prostatic hyperplasia) -conts on rapaflo, blood pressure has been stable  - silodosin (RAPAFLO) 8 MG CAPS capsule; Place 1 capsule (8 mg total) into feeding tube daily with breakfast.  Dispense: 30 capsule  4. Gout of knee, unspecified cause, unspecified chronicity, unspecified laterality -off all gout medication at this  time, did not feel like it was making a difference and was taken off in hospital   5. Decreased appetite -ongoing, taking in supplements via PEG tube -pt or daughter do not want appetite stimulant at this time  6. Ectropion of right lower eyelid Following with Ophthalmology who plans to repair Reports this is not related to myasthenia gravis   7. Myasthenia gravis -has appt scheduled with neurology  - in the meantime will change to pyridostigmine (MESTINON) to liquid and increase to QID to help with symptom management 60 MG/5ML syrup; Take 5 mLs (60 mg total) by mouth 4 (four) times daily.  Dispense: 473 mL; Refill: 3 - DNR (Do Not Resuscitate) clarified with pt and daughter  Follow up in 2 months

## 2014-09-08 NOTE — Patient Instructions (Signed)
Follow up in 8 weeks 

## 2014-09-09 DIAGNOSIS — G7000 Myasthenia gravis without (acute) exacerbation: Secondary | ICD-10-CM | POA: Insufficient documentation

## 2014-09-09 NOTE — Assessment & Plan Note (Signed)
Recurrent Pleural Effusion  Discussed several options with pt  Will proceed with repeat throacentesis with follow up with Dr. Vassie LollAlva    Plan  We are setting you up for a thoracentesis to drain fluid out of right lung.  Follow up Dr. Vassie LollAlva in 4 weeks with chest xray   Please contact office for sooner follow up if symptoms do not improve or worsen or seek emergency care

## 2014-09-09 NOTE — Assessment & Plan Note (Signed)
Follow up with neurology as planned  Please contact office for sooner follow up if symptoms do not improve or worsen or seek emergency care

## 2014-09-09 NOTE — Progress Notes (Signed)
Subjective:    [name], male    DOB: [birth year], 81 y.o.   MRN: [id number]  HPI  81 year old man, never smoker referred for evaluation of  chronic right effusion. PMH of CKD stage III,diastolic CHF, BPH   Significant tests/ events  He was found to have an exudative effusion Feb 2015 with negative cytology, when he was admitted for hip fracture  -05/21/14--Repeat thoracocentesis with LDH and protein--1.2L removed  07/21/14 1.5 L thora -after  ED visit Review of pleural fluid chemistry shows high-protein ranging from 3.9-4.1, and normal LDH from 105-127. Chest x-ray in 02/2013 was clear.  -05/22/14 echocardiogram--EF 60%, grade 1 diastolic dysfx, No WMA -Venous duplex --negative  -TSH 3.670 -UA was neg for protein  He was admitted 04/2014 for mild confusion and dysarthria, repeated head imaging has been negative including MRI. Attributed to UTI in 09/2013. He now again presents with dysphonia and dysphagia to solids over the Thanksgiving weekend. He is accompanied by his daughter who provides most of the history-she reports some waxing and waning of symptoms, appears to be worse towards evening, he does ambulate with a walker, he denies stiffness in his extremities. He reports one episode of choking. His weight has dropped from about 6 pounds from 120 12115. He has bilateral hydronephrosis attributed to an enlarged prostate (Nesi)  08/26/14 Follow up recurrent Pleural Effusion , MG  Patient returns for a one-month follow-up for pleural effusion Last thoracentesis was November 30 with 1.5 L removed Cytology has been negative to date Patient has recently been diagnosed with myasthenia gravis He has been referred to neurology. However, does not have an appointment until March. They have [facility] waiting a appointment. He has been started on Mesitonin. Does feel that his swallowing has improved some since beginning this medication He now  has a PEG tube due to significant severe dysphagia. Had seemed to be improving, however, over the last few days has had increased shortness of breath. Chest x-ray today shows reaccumulation of right-sided pleural effusion. He denies any fever, hemoptysis, orthopnea, PND, or increased leg swelling   Past Medical History  Diagnosis Date  . Gout   . Insomnia   . BPH (benign prostatic hyperplasia)   . Kidney disorder   . Arthritis   . Hypertension     patient denies on 10/25/12 on no meds   . Complication of anesthesia     hard to wake up -2011 after hernia surgery   . Anemia   . CHF (congestive heart failure)   . Prostate disorder   . Hernia, abdominal     Past Surgical History  Procedure Laterality Date  . Cataract extraction    . Cholecystectomy    . Hernia repair  02/22/11    right  . Joint replacement      fractured hip  . Total hip arthroplasty Right 10/29/2012    Procedure: CONVERSION OF PREVIOUS HIP SURGERY TO RIGHT TOTAL HIP AND REMOVAL OF IM NAIL ;  Surgeon: Shelda PalMatthew D Olin, MD;  Location: [facility];  Service: Orthopedics;  Laterality: Right;  . Esophagogastroduodenoscopy N/A 11/16/2012    Procedure: ESOPHAGOGASTRODUODENOSCOPY (EGD);  Surgeon: Theda BelfastPatrick D Hung, MD;  [facility];  Service: Endoscopy;  Laterality: N/A;  . Intramedullary (im) nail intertrochanteric Left 10/17/2013    Procedure: INTRAMEDULLARY (IM) NAIL INTERTROCHANTRIC;  Surgeon: Shelda PalMatthew D Olin, MD;  Location: [facility];  Service: Orthopedics;  Laterality: Left;  . Esophagogastroduodenoscopy N/A 07/27/2014  Procedure: ESOPHAGOGASTRODUODENOSCOPY (EGD);  Surgeon: Graylin Shiver, MD;  Location: [facility] END[facility]COPY;  Service: Endoscopy;  Laterality: N/A;    Allergies  Allergen Reactions  . Sertraline Nausea Only  . Nsaids Other (See Comments)    history of renal failure    History   Social History  . Marital Status: Married    Spouse Name: N/A    Number of Children: N/A  . Years of Education: N/A    Occupational History  . Not on file.   Social History Main Topics  . Smoking status: Never Smoker   . Smokeless tobacco: Never Used  . Alcohol Use: No  . Drug Use: No  . Sexual Activity: No   Other Topics Concern  . Not on file   Social History Narrative   Worked for [employer]   Also did map drawing    Family History  Problem Relation Age of Onset  . Heart failure Mother     heart  . Heart failure Father     fell and hit head  . Cancer Brother     colon  . Diabetes Daughter       Review of Systems neg for any significant sore throat, dysphagia, itching, sneezing, nasal congestion or excess/ purulent secretions, fever, chills, sweats, unintended wt loss, pleuritic or exertional cp, hempoptysis, orthopnea pnd or change in chronic leg swelling. Also denies presyncope, palpitations, heartburn, abdominal pain, nausea, vomiting, diarrhea or change in bowel or urinary habits, dysuria,hematuria, rash, arthralgias, visual complaints, headache, numbness weakness or ataxia.     Objective:   Physical Exam  Gen. Pleasant, frail and elderly  in no distress, normal affect ENT - no lesions, no post nasal drip, ectropion Neck: No JVD, no thyromegaly, no carotid bruits Lungs: no use of accessory muscles, no dullness to percussion, clear without rales or rhonchi  Cardiovascular: Rhythm regular, heart sounds  normal, no murmurs or gallops, no peripheral edema Abdomen: soft and non-tender, no hepatosplenomegaly, BS normal. Musculoskeletal: No deformities, no cyanosis or clubbing Neuro:  alert, non focal,    08/26/14 CXR  Interval reaccumulation of pleural fluid on the right such that fluid occupies 60-70% of the pleural space volume      Assessment & Plan:

## 2014-09-09 NOTE — Assessment & Plan Note (Signed)
Compensated   Cont on current regimen

## 2014-09-17 ENCOUNTER — Ambulatory Visit (INDEPENDENT_AMBULATORY_CARE_PROVIDER_SITE_OTHER): Payer: Medicare Other | Admitting: Neurology

## 2014-09-17 ENCOUNTER — Encounter: Payer: Self-pay | Admitting: Neurology

## 2014-09-17 VITALS — BP 80/50 | HR 70 | Temp 97.5°F | Resp 16 | Ht 61.0 in | Wt 114.4 lb

## 2014-09-17 DIAGNOSIS — G7000 Myasthenia gravis without (acute) exacerbation: Secondary | ICD-10-CM

## 2014-09-17 MED ORDER — PYRIDOSTIGMINE BROMIDE 60 MG PO TABS: 60.0000 mg | ORAL_TABLET | Freq: Three times a day (TID) | ORAL | Status: AC

## 2014-09-17 MED ORDER — OMEPRAZOLE 20 MG PO CPDR: 20.0000 mg | DELAYED_RELEASE_CAPSULE | Freq: Every day | ORAL | Status: DC

## 2014-09-17 MED ORDER — PREDNISONE 10 MG PO TABS: 10.0000 mg | ORAL_TABLET | Freq: Every day | ORAL | Status: DC

## 2014-09-17 NOTE — Progress Notes (Signed)
NEUROLOGY CONSULTATION NOTE  [name] MRN: [id number] DOB: 08-10-1925  Referring provider: Cyril Mourningakesh Alva, MD Primary care provider: Abbey ChattersJessica Eubanks, NP  Reason for consult:  Myasthenia gravis  HISTORY OF PRESENT ILLNESS: [name] is an 81 year old right-handed man with CHF, hip replacement and BPH who presents for myasthenia gravis.  Records, labs, and MRIs reviewed.  He is accompanied by his daughter who provides some history.  Around Thanksgiving, he began to experience dysphonia and trouble swallowing, at first choking on liquids and later trouble swallowing solids.    He had prior episodes earlier in the year, when he had either a UTI or increased pleural effusion.  It would resolve when the underlying condition was treated.  He had a modified barium swallow on 07/23/14, which revealed moderate oral phase dysphagia, severe pharyngeal phase dysphagia and severe cervical esophageal phase dysphagia, indicating high risk for aspiration.  [facility] 07[facility]/14 for PEG placement.  GI evaluation, including EGD, was unremarkable.  Work up for occult neoplasm, including CT of chest/abdomen/pelvis, was negative.  Thymoma was not noted.  Neurology was consulted.  MRI of the brain was negative for stroke.  MRA of the head was unremarkable.  A Myasthenia panel was checked, which revealed elevated Acetylcholine binding antibodies of 12.86, Acetylcholine blocking antibodies of 72, and Acetylcholine Modulating antibodies of 94.  Striated Muscle antibodies were negative.  He followed up with his PCP on 08/08/14, where he was started on Mestinon 30mg  three times daily.  A repeat Modified Barium Swallow was performed on 08/14/14, which revealed "moderate pharyngeal dysphagia with a primary motor component" and swallow function "significantly improved from previous MBS."  Dysphagia 2 (fine chop), nectar-thick liquid diet was recommended.  Mestinon was further increased to 30mg  four  times daily on 09/08/14.  Over the past couple of days, he has had worsening of his speech and swallow.  He denies double vision.  He denies shortness of breath, however he does is easily out of breath when he climbs into bed at night.  He has history of hip and knee problems due to arthritis and uses a walker.  However, there has not been any changes in his gait.  Symptoms seem worse when he is tired.    He has longstanding history of low blood pressure SBP 90s.    PAST MEDICAL HISTORY: Past Medical History  Diagnosis Date  . Gout   . Insomnia   . BPH (benign prostatic hyperplasia)   . Kidney disorder   . Arthritis   . Hypertension     patient denies on 10/25/12 on no meds   . Complication of anesthesia     hard to wake up -2011 after hernia surgery   . Anemia   . CHF (congestive heart failure)   . Prostate disorder   . Hernia, abdominal     PAST SURGICAL HISTORY: Past Surgical History  Procedure Laterality Date  . Cataract extraction    . Cholecystectomy    . Hernia repair  02/22/11    right  . Joint replacement      fractured hip  . Total hip arthroplasty Right 10/29/2012    Procedure: CONVERSION OF PREVIOUS HIP SURGERY TO RIGHT TOTAL HIP AND REMOVAL OF IM NAIL ;  Surgeon: Shelda PalMatthew D Olin, MD;  Location: [facility];  Service: Orthopedics;  Laterality: Right;  . Esophagogastroduodenoscopy N/A 11/16/2012    Procedure: ESOPHAGOGASTRODUODENOSCOPY (EGD);  Surgeon: Theda BelfastPatrick D Hung, MD;  Location: [facility];  Service: Endoscopy;  Laterality: N/A;  . Intramedullary (im) nail intertrochanteric Left 10/17/2013    Procedure: INTRAMEDULLARY (IM) NAIL INTERTROCHANTRIC;  Surgeon: Shelda Pal, MD;  Location: [facility];  Service: Orthopedics;  Laterality: Left;  . Esophagogastroduodenoscopy N/A 07/27/2014    Procedure: ESOPHAGOGASTRODUODENOSCOPY (EGD);  Surgeon: Graylin Shiver, MD;  Location[facility];  Service: Endoscopy;  Laterality: N/A;    MEDICATIONS: Current Outpatient Prescriptions on File  Prior to Visit  Medication Sig Dispense Refill  . Acetaminophen (TYLENOL) 167 MG/5ML LIQD Take by mouth. 500 mg every 4 hours while awake via feeding tube    . Nutritional Supplements (FEEDING SUPPLEMENT, JEVITY 1.2 CAL,) LIQD Place 1,000 mLs into feeding tube continuous.  0  . silodosin (RAPAFLO) 8 MG CAPS capsule Place 1 capsule (8 mg total) into feeding tube daily with breakfast. 30 capsule   . naphazoline-glycerin (CLEAR EYES) 0.012-0.2 % SOLN Place 1-2 drops into both eyes every 4 (four) hours as needed for irritation.     No current facility-administered medications on file prior to visit.    ALLERGIES: Allergies  Allergen Reactions  . Sertraline Nausea Only  . Nsaids Other (See Comments)    history of renal failure    FAMILY HISTORY: Family History  Problem Relation Age of Onset  . Heart failure Mother     heart  . Heart failure Father     fell and hit head  . Cancer Brother     colon  . Diabetes Daughter     SOCIAL HISTORY: History   Social History  . Marital Status: Married    Spouse Name: N/A    Number of Children: N/A  . Years of Education: N/A   Occupational History  . Not on file.   Social History Main Topics  . Smoking status: Never Smoker   . Smokeless tobacco: Never Used  . Alcohol Use: No  . Drug Use: No  . Sexual Activity: No   Other Topics Concern  . Not on file   Social History Narrative   Worked for [employer]   Also did map drawing    REVIEW OF SYSTEMS: Constitutional: No fevers, chills, or sweats, no generalized fatigue, change in appetite Eyes: No visual changes, double vision, eye pain Ear, nose and throat: No hearing loss, ear pain, nasal congestion, sore throat Cardiovascular: No chest pain, palpitations Respiratory:  No shortness of breath at rest or with exertion, wheezes GastrointestinaI: No nausea, vomiting, diarrhea, abdominal pain, fecal incontinence Genitourinary:  No dysuria, urinary retention or frequency Musculoskeletal:   No neck pain, back pain Integumentary: No rash, pruritus, skin lesions Neurological: as above Psychiatric: No depression, insomnia, anxiety Endocrine: No palpitations, fatigue, diaphoresis, mood swings, change in appetite, change in weight, increased thirst Hematologic/Lymphatic:  No anemia, purpura, petechiae. Allergic/Immunologic: no itchy/runny eyes, nasal congestion, recent allergic reactions, rashes  PHYSICAL EXAM: Filed Vitals:   09/17/14 1232  BP: 80/50  Pulse: 70  Temp: 97.5 F (36.4 C)  Resp: 16   General: No acute distress Head:  Normocephalic/atraumatic Eyes:  fundi unremarkable, without vessel changes, exudates, hemorrhages or papilledema. Neck: supple, no paraspinal tenderness, full range of motion Back: No paraspinal tenderness Heart: regular rate and rhythm Lungs: Clear to auscultation bilaterally. Vascular: No carotid bruits. Neurological Exam: Mental status: alert and oriented to person, place, and time, recent and remote memory intact, fund of knowledge intact, attention and concentration intact, speech fluent but dysphonic and with bulbar dysarthria. Cranial nerves: CN I: not tested CN II: bilateral surgical  pupils 2 mm on the right and 1 mm on the left, not reactive, visual fields intact, fundi not visualized CN III, IV, VI:  full range of motion, no nystagmus, no ptosis but bilateral ectropion of the lower lids CN V: facial sensation intact CN VII: lower facial weakness CN VIII: hearing intact CN IX, X: gag sluggish, uvula midline CN XI: sternocleidomastoid and trapezius muscles intact CN XII: tongue midline Bulk & Tone: normal, no fasciculations. Motor:  3+ bilateral deltoids, 5- bilateral biceps and triceps, otherwise 5/5.  He has a flexed neck posture but neck flexion and extension is intact. Sensation:  Reduced pinprick sensation in the feet and hands.  Vibration intact. Deep Tendon Reflexes:  2+ in upper extremities, absent in lower extremities, toes  downgoing. Finger to nose testing:  Kinetic tremor but no dysmetria Gait:  Stooped posture, shuffling gait, uses walker. Romberg negative.  IMPRESSION: Myasthenia gravis Dysphonia Bulbar dysarthria hypotension  PLAN: 1.  Change mestinon to  three times daily 2.  Start prednisone  daily 3.  Repeat Modified Barium Swallow as he has gotten worse over past couple of days 4.  Monitor blood pressure at home and for extreme lightheadedness 5.  Follow up in 2 weeks 6.  He has an appointment at [facility] next month.  I recommend that he keep that appointment.  Thank you for allowing me to take part in the care of this patient.  Shon Millet, DO  CC:  Abbey Chatters, NP  Cyril Mourning, MD

## 2014-09-17 NOTE — Patient Instructions (Addendum)
1.  I would increase mestinon to 60mg  three times daily 2.  I would add prednisone 10mg  daily in the morning with breakfast 3.  Take omeprazole 20mg  daily and calcium and vitamin D supplement (like Calcitrex). 4.  We will get another modified barium swallow [facility] 1st floor  Radiology  09/24/14 11:15am  5.  Monitor blood pressure and extreme lightheadedness 6.  Follow up in 2 weeks 7.  [facility]

## 2014-09-19 ENCOUNTER — Other Ambulatory Visit (HOSPITAL_COMMUNITY): Payer: Self-pay | Admitting: Neurology

## 2014-09-19 DIAGNOSIS — R1310 Dysphagia, unspecified: Secondary | ICD-10-CM

## 2014-09-20 DIAGNOSIS — G7001 Myasthenia gravis with (acute) exacerbation: Secondary | ICD-10-CM

## 2014-09-20 DIAGNOSIS — I5031 Acute diastolic (congestive) heart failure: Secondary | ICD-10-CM

## 2014-09-20 DIAGNOSIS — R1313 Dysphagia, pharyngeal phase: Secondary | ICD-10-CM

## 2014-09-20 DIAGNOSIS — J949 Pleural condition, unspecified: Secondary | ICD-10-CM

## 2014-09-20 DIAGNOSIS — Z431 Encounter for attention to gastrostomy: Secondary | ICD-10-CM

## 2014-09-20 DIAGNOSIS — R1311 Dysphagia, oral phase: Secondary | ICD-10-CM

## 2014-09-22 ENCOUNTER — Telehealth: Payer: Self-pay | Admitting: *Deleted

## 2014-09-22 MED ORDER — AMBULATORY NON FORMULARY MEDICATION: Status: DC

## 2014-09-22 NOTE — Telephone Encounter (Signed)
Okay to have hospital bed and suction due to myasthenia gravis

## 2014-09-22 NOTE — Telephone Encounter (Signed)
Order Printed and faxed to [facility] Fax #: [phone]

## 2014-09-22 NOTE — Telephone Encounter (Signed)
Spoke with [name], daughter regarding orders.

## 2014-09-22 NOTE — Telephone Encounter (Signed)
Cathy with [name] called and stated that patient is having more trouble swallowing his saliva. Stated that it pools in the back of his throat. She thinks he is refluxing due to his bed and tube feedings and is requesting a Rx for a Hospital Bed and a suctioning machine. Please Advise.

## 2014-09-24 ENCOUNTER — Ambulatory Visit (HOSPITAL_COMMUNITY): Payer: Medicare Other

## 2014-09-24 ENCOUNTER — Other Ambulatory Visit (HOSPITAL_COMMUNITY): Payer: Medicare Other

## 2014-09-26 MED ORDER — AMBULATORY NON FORMULARY MEDICATION: Status: AC

## 2014-09-26 NOTE — Telephone Encounter (Signed)
Patient daughter, [name] called and stated that [facility] haven't sent out the equipment yet so she called them and they told her they needed more information. [name] stated that her dad does not want a hospital bed now, just needs the suctioning machine but it has to say on the Rx Dysphagia, which she states he has. Rx printed and given to Dr. Renato Gailseed to sign.

## 2014-09-26 NOTE — Addendum Note (Signed)
Addended by: Nelda SevereMAY, Seylah Wernert A on: 09/26/2014 12:32 PM   Modules accepted: Orders

## 2014-10-03 ENCOUNTER — Ambulatory Visit (INDEPENDENT_AMBULATORY_CARE_PROVIDER_SITE_OTHER): Payer: Medicare Other | Admitting: Pulmonary Disease

## 2014-10-03 ENCOUNTER — Encounter: Payer: Self-pay | Admitting: Pulmonary Disease

## 2014-10-03 ENCOUNTER — Ambulatory Visit (INDEPENDENT_AMBULATORY_CARE_PROVIDER_SITE_OTHER)
Admission: RE | Admit: 2014-10-03 | Discharge: 2014-10-03 | Disposition: A | Discharge status: Home / Self Care | Payer: Medicare Other | Source: Ambulatory Visit | Attending: Pulmonary Disease | Admitting: Pulmonary Disease

## 2014-10-03 ENCOUNTER — Encounter: Payer: Self-pay | Admitting: Neurology

## 2014-10-03 ENCOUNTER — Ambulatory Visit (INDEPENDENT_AMBULATORY_CARE_PROVIDER_SITE_OTHER): Payer: Medicare Other | Admitting: Neurology

## 2014-10-03 ENCOUNTER — Other Ambulatory Visit (INDEPENDENT_AMBULATORY_CARE_PROVIDER_SITE_OTHER): Payer: Medicare Other

## 2014-10-03 VITALS — BP 114/72 | HR 89 | Resp 16 | Ht 61.0 in | Wt 111.0 lb

## 2014-10-03 VITALS — BP 110/60 | HR 83 | Ht 61.0 in | Wt 110.2 lb

## 2014-10-03 DIAGNOSIS — J90 Pleural effusion, not elsewhere classified: Secondary | ICD-10-CM

## 2014-10-03 DIAGNOSIS — G7000 Myasthenia gravis without (acute) exacerbation: Secondary | ICD-10-CM

## 2014-10-03 DIAGNOSIS — R1310 Dysphagia, unspecified: Secondary | ICD-10-CM

## 2014-10-03 DIAGNOSIS — R490 Dysphonia: Secondary | ICD-10-CM

## 2014-10-03 LAB — SEDIMENTATION RATE: Sed Rate: 18 mm/hr (ref 0–22)

## 2014-10-03 MED ORDER — PREDNISONE 10 MG PO TABS: 20.0000 mg | ORAL_TABLET | Freq: Every day | ORAL | Status: AC

## 2014-10-03 NOTE — Patient Instructions (Signed)
Fluid around lungs has reaccumulated We discussed pleurX catheter & quality of life with this Blood work today Call if willing to proceed

## 2014-10-03 NOTE — Patient Instructions (Addendum)
1.  We will increase prednisone to two tablets (20mg ) daily with breakfast via the tube 2.  You may take the prilosec via the tube (open the capsule) 3.  Take calcium and vitamin D supplement 4.  Follow up with [facility] 5.  Continue mestinon for now. 6.  In case you need to cancel the appointment with [facility] and cannot see them until more than a month, schedule a follow up with me in one month.  If you are able to follow up with them before then, then call and cancel follow up with me.

## 2014-10-03 NOTE — Progress Notes (Signed)
   Subjective:    [name], male    DOB: 10/26/1924, 81 y.o.   MRN: [id number]  HPI  81 year old man, never smoker, with myasthenia for FU ofrecurrent right effusion. PMH of CKD stage III,diastolic CHF, BPH  He has bilateral hydronephrosis attributed to an enlarged prostate (Nesi)  Significant tests/ events Chest x-ray 02/2013 was clear. He was found to have an exudative effusion Feb 2015 with negative cytology, when he was admitted for hip fracture  -05/21/14--Repeat thoracocentesis--1.2L removed  07/21/14 1.5 L thora -after ED visit Review of pleural fluid chemistry shows high-protein ranging from 3.9-4.1, and normal LDH from 105-127. Cytology negative   -05/22/14 echocardiogram--EF 60%, grade 1 diastolic dysfx, No WMA -Venous duplex --negative  -TSH 3.670 -UA was neg for protein   10/03/2014  Chief Complaint  Patient presents with  . Follow-up    unable to swallow.  no problems with breathing.      Patient has recently been diagnosed with myasthenia gravis He [facility] .He has been started on Mesitonin. Does feel that his swallowing has improved some since beginning this medication, but still has some persistent dysarthria and dysphonia He has a PEG tube due to significant severe dysphagia but has lost significant weight. Had seemed to be improving, however, over the last few days has had increased shortness of breath. Chest x-ray 2/12  shows reaccumulation of right-sided pleural effusion. He denies any fever, hemoptysis, orthopnea, PND, or increased leg swelling Prednisone was recently increased  Past Medical History  Diagnosis Date  . Gout   . Insomnia   . BPH (benign prostatic hyperplasia)   . Kidney disorder   . Arthritis   . Hypertension     patient denies on 10/25/12 on no meds   . Complication of anesthesia     hard to wake up -2011 after hernia surgery   . Anemia   . CHF (congestive heart failure)   .  Prostate disorder   . Hernia, abdominal      Review of Systems neg for any significant sore throat, dysphagia, itching, sneezing, nasal congestion or excess/ purulent secretions, fever, chills, sweats, unintended wt loss, pleuritic or exertional cp, hempoptysis, orthopnea pnd or change in chronic leg swelling. Also denies presyncope, palpitations, heartburn, abdominal pain, nausea, vomiting, diarrhea or change in bowel or urinary habits, dysuria,hematuria, rash, arthralgias, visual complaints, headache, numbness weakness or ataxia.     Objective:   Physical Exam  Gen. Pleasant, chronically ill, poorly nourished, thin, in no distress ENT - no lesions, no post nasal drip Neck: No JVD, no thyromegaly, no carotid bruits Lungs: no use of accessory muscles, no dullness to percussion, decreased breath sounds on right without rales or rhonchi  Cardiovascular: Rhythm regular, heart sounds  normal, no murmurs or gallops, no peripheral edema Musculoskeletal: Hand deformities, no cyanosis or clubbing         Assessment & Plan:

## 2014-10-03 NOTE — Progress Notes (Signed)
NEUROLOGY FOLLOW UP OFFICE NOTE  Jordan Gallusarl L Horn [id number]  HISTORY OF PRESENT ILLNESS: [name] is an 81 year old right-handed man with CKD stage III, diastolic CHF, BPH and arthritis who follows up for myasthenia gravis.  He is accompanied by his daughter who provides some history.  UPDATE: He is taking mestinon 60mg  three times daily.  Last month, he was started on prednisone 10mg  daily.  His swallowing has gotten worse and cannot tolerate any oral diet.  He is taking all medications and nutrition via the feeding tube.  He otherwise feels fine.  He denies generalized fatigue, weakness, double vision or dyspnea.  He has an upcoming appointment with [facility] on Monday, however it may snow that day.  HISTORY: Around Thanksgiving, he began to experience dysphonia and trouble swallowing, at first choking on liquids and later trouble swallowing solids.    He had prior episodes earlier in the year, when he had either a UTI or increased pleural effusion.  It would resolve when the underlying condition was treated.  He had a modified barium swallow on 07/23/14, which revealed moderate oral phase dysphagia, severe pharyngeal phase dysphagia and severe cervical esophageal phase dysphagia, indicating high risk for aspiration.  [facility]14 for PEG placement.  GI evaluation, including EGD, was unremarkable.  Work up for occult neoplasm, including CT of chest/abdomen/pelvis, was negative.  Thymoma was not noted.  Neurology was consulted.  MRI of the brain was negative for stroke.  MRA of the head was unremarkable.  A Myasthenia panel was checked, which revealed elevated Acetylcholine binding antibodies of 12.86, Acetylcholine blocking antibodies of 72, and Acetylcholine Modulating antibodies of 94.  Striated Muscle antibodies were negative.  He followed up with his PCP on 08/08/14, where he was started on Mestinon 30mg  three times daily.  A repeat Modified Barium Swallow was  performed on 08/14/14, which revealed "moderate pharyngeal dysphagia with a primary motor component" and swallow function "significantly improved from previous MBS."  Dysphagia 2 (fine chop), nectar-thick liquid diet was recommended.  Mestinon was further increased to 30mg  four times daily on 09/08/14.  Over the past couple of days, he has had worsening of his speech and swallow.  He denies double vision.  He denies shortness of breath, however he does is easily out of breath when he climbs into bed at night.  He has history of hip and knee problems due to arthritis and uses a walker.  However, there has not been any changes in his gait.  Symptoms seem worse when he is tired.    He has longstanding history of low blood pressure SBP 90s  PAST MEDICAL HISTORY: Past Medical History  Diagnosis Date  . Gout   . Insomnia   . BPH (benign prostatic hyperplasia)   . Kidney disorder   . Arthritis   . Hypertension     patient denies on 10/25/12 on no meds   . Complication of anesthesia     hard to wake up -2011 after hernia surgery   . Anemia   . CHF (congestive heart failure)   . Prostate disorder   . Hernia, abdominal     MEDICATIONS: Current Outpatient Prescriptions on File Prior to Visit  Medication Sig Dispense Refill  . Acetaminophen (TYLENOL) 167 MG/5ML LIQD Take by mouth. 500 mg every 4 hours while awake via feeding tube    . AMBULATORY NON FORMULARY MEDICATION Suctioning Machine Dx: Myasthenia Gravis and Dysphagia 1 each 0  .  Nutritional Supplements (FEEDING SUPPLEMENT, JEVITY 1.2 CAL,) LIQD Place 1,000 mLs into feeding tube continuous.  0  . pyridostigmine (MESTINON) 60 MG tablet Take 1 tablet (60 mg total) by mouth 3 (three) times daily. 90 tablet 3  . silodosin (RAPAFLO) 8 MG CAPS capsule Place 1 capsule (8 mg total) into feeding tube daily with breakfast. 30 capsule   . omeprazole (PRILOSEC) 20 MG capsule Take 1 capsule (20 mg total) by mouth daily. (Patient not taking: Reported on  10/03/2014) 30 capsule 2   No current facility-administered medications on file prior to visit.    ALLERGIES: Allergies  Allergen Reactions  . Sertraline Nausea Only  . Nsaids Other (See Comments)    history of renal failure    FAMILY HISTORY: Family History  Problem Relation Age of Onset  . Heart failure Mother     heart  . Heart failure Father     fell and hit head  . Cancer Brother     colon  . Diabetes Daughter     SOCIAL HISTORY: History   Social History  . Marital Status: Married    Spouse Name: N/A  . Number of Children: N/A  . Years of Education: N/A   Occupational History  . Not on file.   Social History Main Topics  . Smoking status: Never Smoker   . Smokeless tobacco: Never Used  . Alcohol Use: No  . Drug Use: No  . Sexual Activity: No   Other Topics Concern  . Not on file   Social History Narrative   Worked for [employer]   Also did map drawing    REVIEW OF SYSTEMS: Constitutional: No fevers, chills, or sweats, no generalized fatigue, change in appetite Eyes: No visual changes, double vision, eye pain Ear, nose and throat: No hearing loss, ear pain, nasal congestion, sore throat Cardiovascular: No chest pain, palpitations Respiratory:  No shortness of breath at rest or with exertion, wheezes GastrointestinaI: No nausea, vomiting, diarrhea, abdominal pain, fecal incontinence Genitourinary:  No dysuria, urinary retention or frequency Musculoskeletal:  No neck pain, back pain Integumentary: No rash, pruritus, skin lesions Neurological: as above Psychiatric: No depression, insomnia, anxiety Endocrine: No palpitations, fatigue, diaphoresis, mood swings, change in appetite, change in weight, increased thirst Hematologic/Lymphatic:  No anemia, purpura, petechiae. Allergic/Immunologic: no itchy/runny eyes, nasal congestion, recent allergic reactions, rashes  PHYSICAL EXAM: Filed Vitals:   10/03/14 0914  BP: 114/72  Pulse: 89  Resp: 16   General:  No acute distress Head:  Normocephalic/atraumatic Eyes:  Fundoscopic exam unremarkable without vessel changes, exudates, hemorrhages or papilledema. Neck: supple, no paraspinal tenderness, full range of motion Heart:  Regular rate and rhythm Lungs:  Clear to auscultation bilaterally Back: No paraspinal tenderness Neurological Exam: alert and oriented to person, place, and time, recent and remote memory intact, fund of knowledge intact, attention and concentration intact, speech fluent but dysphonic with dysarthria.  Bilateral surgical pupils 2 mm on the right and 1 mm on the left, not reactive, visual fields intact, fundi not visualized.  No ptosis but bilateral ectropion of the lower lids.  Gag sluggish.  Otherwise CN II-XII intact.  Bulk and tone normal.  Muscle strength 3+ in deltoids, otherwise 5/5.  Kinetic tremor on finger to nose testing.  Stooped posture.  Shuffling gait.  IMPRESSION: Myasthenia gravis Dysphagia Dysarthria Dysphonia  PLAN: 1.  Increase prednisone to  daily via feeding tube 2.  Continue mestinon  three times daily for now 3.  Take Prilosec and calcium/vitamin D  supplement 4.  Follow up with [facility] 5.  In case the appointment with [facility] is cancelled due to weather and cannot see them within a month, follow up with me in 4 weeks.  May cancel appointment with me if able to see [facility] before then.  Shon Millet, DO  CC:  Abbey Chatters

## 2014-10-04 NOTE — Assessment & Plan Note (Signed)
I'm still concerned about a paraneoplastic process we will [facility]- Unfortunately, he has not had the dramatic improvement with Mestinon, that I was hoping for.

## 2014-10-04 NOTE — Assessment & Plan Note (Addendum)
Unfortunately the effusion has reaccumulated. He does not have significant dyspnea or oxygen desaturation.  He has undergone thoracentesis multiple times-cytology has been negative. We could still be dealing with malignancy. I explained that the next step would be to proceed with a pleural biopsy-however given his advanced age, and comorbidities-he would not be a good candidate. I offered placement of a Pleurx catheter-as a palliative measure. We discussed quality of life issues around this -he will discuss with his daughter and get back to us if he wants to proceed. We discussed advanced directives, and he does not desire life support measures unless clearly reversible process. We will check basic serology for inflammation-prednisone has been increased to 20 mg

## 2014-10-06 ENCOUNTER — Ambulatory Visit: Payer: Medicare Other | Admitting: Neurology

## 2014-10-06 LAB — CYCLIC CITRUL PEPTIDE ANTIBODY, IGG: Cyclic Citrullin Peptide Ab: <2 U/mL (ref 0.0–5.0)

## 2014-10-06 LAB — ANA: ANA: NEGATIVE

## 2014-10-07 ENCOUNTER — Telehealth: Payer: Self-pay | Admitting: *Deleted

## 2014-10-07 NOTE — Telephone Encounter (Signed)
Spoke with patient's daughter to r/s appointment and she states that appointment is not needed because he has already seen another doctor (Dr Suzi RootsJassey?), appointment has been cancelled

## 2014-10-10 ENCOUNTER — Telehealth: Payer: Self-pay

## 2014-10-10 MED ORDER — ESOMEPRAZOLE MAGNESIUM 40 MG PO PACK: PACK | ORAL | Status: AC

## 2014-10-10 MED ORDER — SCOPOLAMINE 1 MG/3DAYS TD PT72: MEDICATED_PATCH | TRANSDERMAL | Status: DC

## 2014-10-10 NOTE — Telephone Encounter (Signed)
Received from [facility] for Nexium and Scopolamine patch called to [name] [facility] to help with possible reflux andd to help dry secretions. Dr. Renato Gailseed agree with ST request, d/c omeprazole. Called the patient to let him know about the new Rx's and to stop the Omeprazole. He understood.

## 2014-10-15 ENCOUNTER — Ambulatory Visit: Payer: Medicare Other | Admitting: Neurology

## 2014-10-16 ENCOUNTER — Telehealth: Payer: Self-pay | Admitting: *Deleted

## 2014-10-16 ENCOUNTER — Inpatient Hospital Stay (HOSPITAL_BASED_OUTPATIENT_CLINIC_OR_DEPARTMENT_OTHER)
Admission: EM | Admit: 2014-10-16 | Discharge: 2014-10-21 | DRG: 640 | Disposition: E | Payer: Medicare Other | Attending: Internal Medicine | Admitting: Internal Medicine

## 2014-10-16 ENCOUNTER — Encounter (HOSPITAL_BASED_OUTPATIENT_CLINIC_OR_DEPARTMENT_OTHER): Payer: Self-pay | Admitting: *Deleted

## 2014-10-16 ENCOUNTER — Emergency Department (HOSPITAL_BASED_OUTPATIENT_CLINIC_OR_DEPARTMENT_OTHER): Payer: Medicare Other

## 2014-10-16 DIAGNOSIS — R1310 Dysphagia, unspecified: Secondary | ICD-10-CM | POA: Diagnosis present

## 2014-10-16 DIAGNOSIS — Z66 Do not resuscitate: Secondary | ICD-10-CM | POA: Diagnosis present

## 2014-10-16 DIAGNOSIS — R739 Hyperglycemia, unspecified: Secondary | ICD-10-CM | POA: Diagnosis present

## 2014-10-16 DIAGNOSIS — K219 Gastro-esophageal reflux disease without esophagitis: Secondary | ICD-10-CM | POA: Diagnosis present

## 2014-10-16 DIAGNOSIS — N400 Enlarged prostate without lower urinary tract symptoms: Secondary | ICD-10-CM | POA: Diagnosis present

## 2014-10-16 DIAGNOSIS — R0602 Shortness of breath: Secondary | ICD-10-CM | POA: Diagnosis not present

## 2014-10-16 DIAGNOSIS — E860 Dehydration: Secondary | ICD-10-CM | POA: Diagnosis present

## 2014-10-16 DIAGNOSIS — R4182 Altered mental status, unspecified: Secondary | ICD-10-CM

## 2014-10-16 DIAGNOSIS — N183 Chronic kidney disease, stage 3 unspecified: Secondary | ICD-10-CM | POA: Diagnosis present

## 2014-10-16 DIAGNOSIS — E871 Hypo-osmolality and hyponatremia: Secondary | ICD-10-CM | POA: Diagnosis present

## 2014-10-16 DIAGNOSIS — I129 Hypertensive chronic kidney disease with stage 1 through stage 4 chronic kidney disease, or unspecified chronic kidney disease: Secondary | ICD-10-CM | POA: Diagnosis present

## 2014-10-16 DIAGNOSIS — J90 Pleural effusion, not elsewhere classified: Secondary | ICD-10-CM | POA: Diagnosis present

## 2014-10-16 DIAGNOSIS — D649 Anemia, unspecified: Secondary | ICD-10-CM | POA: Diagnosis present

## 2014-10-16 DIAGNOSIS — M109 Gout, unspecified: Secondary | ICD-10-CM | POA: Diagnosis present

## 2014-10-16 DIAGNOSIS — G9340 Encephalopathy, unspecified: Secondary | ICD-10-CM | POA: Diagnosis present

## 2014-10-16 DIAGNOSIS — Z931 Gastrostomy status: Secondary | ICD-10-CM | POA: Diagnosis not present

## 2014-10-16 DIAGNOSIS — G7000 Myasthenia gravis without (acute) exacerbation: Secondary | ICD-10-CM | POA: Diagnosis present

## 2014-10-16 DIAGNOSIS — R441 Visual hallucinations: Secondary | ICD-10-CM | POA: Diagnosis present

## 2014-10-16 DIAGNOSIS — I10 Essential (primary) hypertension: Secondary | ICD-10-CM | POA: Diagnosis present

## 2014-10-16 DIAGNOSIS — Z96641 Presence of right artificial hip joint: Secondary | ICD-10-CM | POA: Diagnosis present

## 2014-10-16 LAB — URINE MICROSCOPIC-ADD ON

## 2014-10-16 LAB — COMPREHENSIVE METABOLIC PANEL
ALT: 18 U/L (ref 0–53)
ANION GAP: 6 (ref 5–15)
AST: 32 U/L (ref 0–37)
Albumin: 3.6 g/dL (ref 3.5–5.2)
Alkaline Phosphatase: 69 U/L (ref 39–117)
BUN: 36 mg/dL — ABNORMAL HIGH (ref 6–23)
CO2: 28 mmol/L (ref 19–32)
CREATININE: 0.77 mg/dL (ref 0.50–1.35)
Calcium: 8.3 mg/dL — ABNORMAL LOW (ref 8.4–10.5)
Chloride: 89 mmol/L — ABNORMAL LOW (ref 96–112)
GFR calc Af Amer: >90 mL/min (ref >90)
GFR, EST NON AFRICAN AMERICAN: 79 mL/min — AB (ref >90)
GLUCOSE: 151 mg/dL — AB (ref 70–99)
Potassium: 4.5 mmol/L (ref 3.5–5.1)
SODIUM: 123 mmol/L — AB (ref 135–145)
TOTAL PROTEIN: 6 g/dL (ref 6.0–8.3)
Total Bilirubin: 0.7 mg/dL (ref 0.3–1.2)

## 2014-10-16 LAB — CBC WITH DIFFERENTIAL/PLATELET
BASOS ABS: 0 10*3/uL (ref 0.0–0.1)
Basophils Relative: 0 % (ref 0–1)
EOS PCT: 0 % (ref 0–5)
Eosinophils Absolute: 0 10*3/uL (ref 0.0–0.7)
HCT: 39.8 % (ref 39.0–52.0)
Hemoglobin: 13.6 g/dL (ref 13.0–17.0)
Lymphocytes Relative: 2 % — ABNORMAL LOW (ref 12–46)
Lymphs Abs: 0.1 10*3/uL — ABNORMAL LOW (ref 0.7–4.0)
MCH: 31.3 pg (ref 26.0–34.0)
MCHC: 34.2 g/dL (ref 30.0–36.0)
MCV: 91.5 fL (ref 78.0–100.0)
Monocytes Absolute: 0.2 10*3/uL (ref 0.1–1.0)
Monocytes Relative: 3 % (ref 3–12)
Neutro Abs: 7.2 10*3/uL (ref 1.7–7.7)
Neutrophils Relative %: 95 % — ABNORMAL HIGH (ref 43–77)
Platelets: 137 10*3/uL — ABNORMAL LOW (ref 150–400)
RBC: 4.35 MIL/uL (ref 4.22–5.81)
RDW: 13.2 % (ref 11.5–15.5)
WBC: 7.6 10*3/uL (ref 4.0–10.5)

## 2014-10-16 LAB — URINALYSIS, ROUTINE W REFLEX MICROSCOPIC
Bilirubin Urine: NEGATIVE
Glucose, UA: NEGATIVE mg/dL
KETONES UR: NEGATIVE mg/dL
LEUKOCYTES UA: NEGATIVE
NITRITE: NEGATIVE
PH: 7 (ref 5.0–8.0)
Protein, ur: NEGATIVE mg/dL
SPECIFIC GRAVITY, URINE: 1.013 (ref 1.005–1.030)
Urobilinogen, UA: 1 mg/dL (ref 0.0–1.0)

## 2014-10-16 LAB — TROPONIN I

## 2014-10-16 LAB — CREATININE, SERUM
CREATININE: 0.84 mg/dL (ref 0.50–1.35)
GFR calc Af Amer: 89 mL/min — ABNORMAL LOW (ref >90)
GFR, EST NON AFRICAN AMERICAN: 76 mL/min — AB (ref >90)

## 2014-10-16 LAB — CBC
HCT: 37.4 % — ABNORMAL LOW (ref 39.0–52.0)
Hemoglobin: 12.9 g/dL — ABNORMAL LOW (ref 13.0–17.0)
MCH: 30.7 pg (ref 26.0–34.0)
MCHC: 34.5 g/dL (ref 30.0–36.0)
MCV: 89 fL (ref 78.0–100.0)
PLATELETS: 130 10*3/uL — AB (ref 150–400)
RBC: 4.2 MIL/uL — AB (ref 4.22–5.81)
RDW: 13.3 % (ref 11.5–15.5)
WBC: 6.9 10*3/uL (ref 4.0–10.5)

## 2014-10-16 LAB — SODIUM, URINE, RANDOM: SODIUM UR: 58 mmol/L

## 2014-10-16 LAB — TSH: TSH: 2.488 u[IU]/mL (ref 0.350–4.500)

## 2014-10-16 LAB — OSMOLALITY: Osmolality: 271 mOsm/kg — ABNORMAL LOW (ref 275–300)

## 2014-10-16 MED ORDER — PREDNISONE 20 MG PO TABS
20.0000 mg | ORAL_TABLET | Freq: Every day | ORAL | Status: DC
  Administered 2014-10-17: 20 mg via ORAL
  Filled 2014-10-16 (×3): qty 1

## 2014-10-16 MED ORDER — SODIUM CHLORIDE 0.9 % IV SOLN
INTRAVENOUS | Status: DC
  Administered 2014-10-16 – 2014-10-17 (×2): via INTRAVENOUS

## 2014-10-16 MED ORDER — ONDANSETRON HCL 4 MG PO TABS: 4.0000 mg | ORAL_TABLET | Freq: Four times a day (QID) | ORAL | Status: DC | PRN

## 2014-10-16 MED ORDER — TAMSULOSIN HCL 0.4 MG PO CAPS
0.4000 mg | ORAL_CAPSULE | Freq: Every day | ORAL | Status: DC
  Filled 2014-10-16 (×2): qty 1

## 2014-10-16 MED ORDER — ACETAMINOPHEN 325 MG PO TABS: 650.0000 mg | ORAL_TABLET | Freq: Four times a day (QID) | ORAL | Status: DC | PRN

## 2014-10-16 MED ORDER — ADULT MULTIVITAMIN W/MINERALS CH
1.0000 | ORAL_TABLET | Freq: Every day | ORAL | Status: DC
  Administered 2014-10-17: 1 via ORAL
  Filled 2014-10-16 (×2): qty 1

## 2014-10-16 MED ORDER — SODIUM CHLORIDE 0.9 % IJ SOLN
3.0000 mL | Freq: Two times a day (BID) | INTRAMUSCULAR | Status: DC
  Administered 2014-10-16 – 2014-10-17 (×3): 3 mL via INTRAVENOUS

## 2014-10-16 MED ORDER — JEVITY 1.2 CAL PO LIQD
1000.0000 mL | ORAL | Status: DC
  Administered 2014-10-17: 1000 mL
  Filled 2014-10-16 (×3): qty 1000

## 2014-10-16 MED ORDER — FOLIC ACID 1 MG PO TABS
1.0000 mg | ORAL_TABLET | Freq: Every day | ORAL | Status: DC
  Administered 2014-10-17: 1 mg via ORAL
  Filled 2014-10-16 (×2): qty 1

## 2014-10-16 MED ORDER — HEPARIN SODIUM (PORCINE) 5000 UNIT/ML IJ SOLN
5000.0000 [IU] | Freq: Three times a day (TID) | INTRAMUSCULAR | Status: DC
  Administered 2014-10-16 – 2014-10-17 (×4): 5000 [IU] via SUBCUTANEOUS
  Filled 2014-10-16 (×6): qty 1

## 2014-10-16 MED ORDER — PYRIDOSTIGMINE BROMIDE 60 MG PO TABS
60.0000 mg | ORAL_TABLET | Freq: Three times a day (TID) | ORAL | Status: DC
  Administered 2014-10-16 – 2014-10-17 (×4): 60 mg via ORAL
  Filled 2014-10-16 (×8): qty 1

## 2014-10-16 MED ORDER — ASPIRIN EC 325 MG PO TBEC
325.0000 mg | DELAYED_RELEASE_TABLET | Freq: Every day | ORAL | Status: DC
  Administered 2014-10-17: 325 mg via ORAL
  Filled 2014-10-16 (×2): qty 1

## 2014-10-16 MED ORDER — SCOPOLAMINE 1 MG/3DAYS TD PT72
1.0000 | MEDICATED_PATCH | TRANSDERMAL | Status: DC
  Filled 2014-10-16: qty 1

## 2014-10-16 MED ORDER — VITAMIN B-1 100 MG PO TABS
100.0000 mg | ORAL_TABLET | Freq: Every day | ORAL | Status: DC
  Filled 2014-10-16 (×2): qty 1

## 2014-10-16 MED ORDER — ACETAMINOPHEN 650 MG RE SUPP: 650.0000 mg | Freq: Four times a day (QID) | RECTAL | Status: DC | PRN

## 2014-10-16 MED ORDER — ONDANSETRON HCL 4 MG/2ML IJ SOLN: 4.0000 mg | Freq: Four times a day (QID) | INTRAMUSCULAR | Status: DC | PRN

## 2014-10-16 MED ORDER — PANTOPRAZOLE SODIUM 40 MG PO PACK
40.0000 mg | PACK | Freq: Every day | ORAL | Status: DC
  Administered 2014-10-17: 40 mg via ORAL
  Filled 2014-10-16 (×3): qty 20

## 2014-10-16 NOTE — Telephone Encounter (Signed)
Patient care taker which is his daughter called stating the patient seems to be going down hill he was stating he saw a lady in his room last night as ask to have her leave I advise the daughter to take him to ED as he may have a UTI this is nothing to do with his myasthenia gravis per DR Everlena CooperJaffe

## 2014-10-16 NOTE — ED Notes (Signed)
Report has been called to Psychologist, occupationalBethany RN at [facility]  By Stark JockN Murel Shenberger RN at [facility].

## 2014-10-16 NOTE — ED Provider Notes (Signed)
CSN: [id number]     Arrival date & time 10/01/2014  1208 History   First MD Initiated Contact with Patient 09/22/2014 1254     Chief Complaint  Patient presents with  . Altered Mental Status     (Consider location/radiation/quality/duration/timing/severity/associated sxs/prior Treatment) HPI Comments: Patient presents to the ER for evaluation of confusion. Patient has had a significant change in his mental status today. He is normally fairly independent, today he cannot walk on his own. He is acutely very confused and is hallucinating. Information provided by the patient's daughter who lives with him. He cannot answer any questions at this time. Level V Caveat due to mental status change.  Patient is a 81 y.o. male presenting with altered mental status.  Altered Mental Status   Past Medical History  Diagnosis Date  . Gout   . Insomnia   . BPH (benign prostatic hyperplasia)   . Kidney disorder   . Arthritis   . Hypertension     patient denies on 10/25/12 on no meds   . Complication of anesthesia     hard to wake up -2011 after hernia surgery   . Anemia   . CHF (congestive heart failure)   . Prostate disorder   . Hernia, abdominal    Past Surgical History  Procedure Laterality Date  . Cataract extraction    . Cholecystectomy    . Hernia repair  02/22/11    right  . Joint replacement      fractured hip  . Total hip arthroplasty Right 10/29/2012    Procedure: CONVERSION OF PREVIOUS HIP SURGERY TO RIGHT TOTAL HIP AND REMOVAL OF IM NAIL ;  Surgeon: Shelda PalMatthew D Olin, MD;  Location: [facility];  Service: Orthopedics;  Laterality: Right;  . Esophagogastroduodenoscopy N/A 11/16/2012    Procedure: ESOPHAGOGASTRODUODENOSCOPY (EGD);  Surgeon: Theda BelfastPatrick D Hung, MD;  [facility];  Service: Endoscopy;  Laterality: N/A;  . Intramedullary (im) nail intertrochanteric Left 10/17/2013    Procedure: INTRAMEDULLARY (IM) NAIL INTERTROCHANTRIC;  Surgeon: Shelda PalMatthew D Olin, MD;  Location: [facility];  Service:  Orthopedics;  Laterality: Left;  . Esophagogastroduodenoscopy N/A 07/27/2014    Procedure: ESOPHAGOGASTRODUODENOSCOPY (EGD);  Surgeon: Graylin ShiverSalem F Ganem, MD;  [facility];  Service: Endoscopy;  Laterality: N/A;   Family History  Problem Relation Age of Onset  . Heart failure Mother     heart  . Heart failure Father     fell and hit head  . Cancer Brother     colon  . Diabetes Daughter    History  Substance Use Topics  . Smoking status: Never Smoker   . Smokeless tobacco: Never Used  . Alcohol Use: No    Review of Systems  Unable to perform ROS: Mental status change      Allergies  Sertraline and Nsaids  Home Medications   Prior to Admission medications   Medication Sig Start Date End Date Taking? Authorizing Provider  Acetaminophen (TYLENOL) 167 MG/5ML LIQD Take by mouth. 500 mg every 4 hours while awake via feeding tube    Historical Provider, MD  AMBULATORY NON FORMULARY MEDICATION Suctioning Machine Dx: Myasthenia Gravis and Dysphagia 09/26/14   Kermit Baloiffany L Reed, DO  esomeprazole (NEXIUM) 40 MG packet Take 40mg  twice daily via Peg tube for reflux 10/10/14   Tiffany L Reed, DO  Nutritional Supplements (FEEDING SUPPLEMENT, JEVITY 1.2 CAL,) LIQD Place 1,000 mLs into feeding tube continuous. 08/01/14   Penny Piarlando Vega, MD  predniSONE (DELTASONE) 10 MG tablet Take 2 tablets (  20 mg total) by mouth daily with breakfast. 10/03/14   Cira Servant, DO  pyridostigmine (MESTINON) 60 MG tablet Take 1 tablet (60 mg total) by mouth 3 (three) times daily. 09/17/14   Cira Servant, DO  scopolamine (TRANSDERM-SCOP) 1 MG/3DAYS Place one patch behind the ear, change every 3 days forto help dry secretions 10/10/14   Tiffany L Reed, DO  silodosin (RAPAFLO) 8 MG CAPS capsule Place 1 capsule (8 mg total) into feeding tube daily with breakfast. 09/08/14   [name], NP   BP 112/50 mmHg  Pulse 77  Temp(Src) 97.6 F (36.4 C) (Oral)  Resp 18  Ht 5' (1.524 m)  Wt 110 lb (49.896 kg)   BMI 21.48 kg/m2  SpO2 96% Physical Exam  Constitutional: He appears well-developed and well-nourished. No distress.  HENT:  Head: Normocephalic and atraumatic.  Right Ear: Hearing normal.  Left Ear: Hearing normal.  Nose: Nose normal.  Mouth/Throat: Oropharynx is clear and moist and mucous membranes are normal.  Eyes: Conjunctivae and EOM are normal. Pupils are equal, round, and reactive to light.  Neck: Normal range of motion. Neck supple.  Cardiovascular: Regular rhythm, S1 normal and S2 normal.  Exam reveals no gallop and no friction rub.   No murmur heard. Pulmonary/Chest: Effort normal and breath sounds normal. No respiratory distress. He exhibits no tenderness.  Abdominal: Soft. Normal appearance and bowel sounds are normal. There is no hepatosplenomegaly. There is no tenderness. There is no rebound, no guarding, no tenderness at McBurney's point and negative Murphy's sign. No hernia.  Musculoskeletal: Normal range of motion.  Neurological: He is alert. He has normal strength. He is disoriented. No cranial nerve deficit or sensory deficit. Coordination normal. GCS eye subscore is 4. GCS verbal subscore is 4. GCS motor subscore is 6.  Skin: Skin is warm, dry and intact. No rash noted. No cyanosis.  Psychiatric: He has a normal mood and affect. His speech is normal.  Nursing note and vitals reviewed.   ED Course  Procedures (including critical care time) Labs Review Labs Reviewed  CBC WITH DIFFERENTIAL/PLATELET - Abnormal; Notable for the following:    Platelets 137 (*)    Neutrophils Relative % 95 (*)    Lymphocytes Relative 2 (*)    Lymphs Abs 0.1 (*)    All other components within normal limits  COMPREHENSIVE METABOLIC PANEL - Abnormal; Notable for the following:    Sodium 123 (*)    Chloride 89 (*)    Glucose, Bld 151 (*)    BUN 36 (*)    Calcium 8.3 (*)    GFR calc non Af Amer 79 (*)    All other components within normal limits  URINALYSIS, ROUTINE W REFLEX  MICROSCOPIC - Abnormal; Notable for the following:    Hgb urine dipstick TRACE (*)    All other components within normal limits  URINE MICROSCOPIC-ADD ON  TROPONIN I    Imaging Review Ct Head Wo Contrast  10/03/2014   CLINICAL DATA:  Mental status change  EXAM: CT HEAD WITHOUT CONTRAST  TECHNIQUE: Contiguous axial images were obtained from the base of the skull through the vertex without intravenous contrast.  COMPARISON:  CT head 07/24/2014  FINDINGS: No skull fracture is noted. Paranasal sinuses and mastoid air cells are unremarkable. Atherosclerotic calcifications of carotid siphon. Stable cerebral atrophy. Stable periventricular and patchy subcortical chronic white matter disease. No acute cortical infarction. No mass lesion is noted on this unenhanced scan.  IMPRESSION: No acute intracranial  abnormality. Stable atrophy and chronic white matter disease.   Electronically Signed   By: Natasha Mead M.D.   On: 2014/11/08 14:30   Dg Chest Port 1 View  11/08/14   CLINICAL DATA:  Shortness of breath, history of pleural effusion on the right  EXAM: PORTABLE CHEST - 1 VIEW  COMPARISON:  PA and lateral chest x-ray of October 03, 2014  FINDINGS: The left lung is mildly hypoinflated but clear. On the right there is a moderate-sized pleural effusion which is stable. The right heart border is obscured. The central pulmonary vascularity is mildly prominent. There are degenerative changes of the shoulders.  IMPRESSION: Bilateral pulmonary hypoinflation accentuates the interstitial lung markings. There remains a moderate-sized right pleural effusion.   Electronically Signed   By: David  [place]   On: Nov 08, 2014 14:01     EKG Interpretation   Date/Time:  Thursday 11-08-14 14:08:01 EST Ventricular Rate:  71 PR Interval:  118 QRS Duration: 72 QT Interval:  372 QTC Calculation: 404 R Axis:   -20 Text Interpretation:  Normal sinus rhythm Low voltage QRS Borderline ECG  No significant change since  last tracing Confirmed by Damya Comley  MD,  Draysen Weygandt [phone]) on 11/08/2014 2:34:18 PM      MDM   Final diagnoses:  Shortness of breath  Mental status change  Hyponatremia   Patient presents to the ER for evaluation of acute mental status changes. Patient reportedly is fairly independent. He does have a history of myasthenia gravis and has had progressive weakness recently. His neurologist prescribed scopolamine patches to decrease his secretions. This was started yesterday. Since then he has had a progressive decline. This includes difficulty with ambulation, confusion as well as visual hallucinations. I suspect that a significant portion of his mental status changes secondary to the scopolamine patch.   Because of the history of myasthenia gravis and difficulty with his secretions, chest x-rays was obtained to rule out pneumonia and aspiration. He is breathing comfortably and his breath sounds are unremarkable. Chest x-ray did not show any evidence of infiltrate.   His blood work reveals significant hyponatremia. The remainder of his workup was unremarkable. No signs of infection. CT head unremarkable. Cardiac evaluation negative. Patient will be admitted for further management of acute encephalopathy and hyponatremia.    Gilda Crease, MD 08-Nov-2014 [phone]

## 2014-10-16 NOTE — ED Notes (Signed)
Received tcf patients daughter and poa, updated on poc and updates on delay of care. Appropriate follow up numbers provided for patients daughter to express concerns

## 2014-10-16 NOTE — ED Notes (Signed)
Secretary  Called back to [facility] to inquire about transport again for the Pt.     [facility] informed Secretary that it would be a couple of more hours when the first call made to [facility] was at [employer] Secretary was told it would be a couple of hours.  Now we have placed a call to [employer] For transport.

## 2014-10-16 NOTE — H&P (Signed)
[facility] History and Physical  BRAXSTON QUINTER ZOX:[id number] DOB: 08-12-1925 DOA: 10/06/2014  Referring physician: Dutch Quint, MD PCP: [name], NP   Chief Complaint: Altered Mental Status  HPI: [name] is a 81 y.o. male presents with altered mental status. Patient presented to [facility] ED with increasing confusion. Patient apparently is normally able to live independently. The daughter states that he has been confused since yesterday. Patient was seeing things and was not able to find his bedroom in his house. Today she states that he was not able to walk even with using the walker. He has never had a stroke before. He was in the hospital in December for difficulty swallowing. Apparently was diagnosed with Myaesthenia Gravis. His daughter states that he recently started on scopalimine. He was supposed to follow up with [facility] but they were not able to go due to snow. Patient also has a chronic pleural effusion as well which has been drained in the past. Apparently she states that they do not want to pursue this any further. A CT scan done was done in December and this showed the Right sided Pleural effusion. Patient is a DNR.   Review of Systems:  12 point ROS performed is unremarkable other than what is noted in HPI and patient is not able to provide  Past Medical History  Diagnosis Date  . Gout   . Insomnia   . BPH (benign prostatic hyperplasia)   . Kidney disorder   . Arthritis   . Hypertension     patient denies on 10/25/12 on no meds   . Complication of anesthesia     hard to wake up -2011 after hernia surgery   . Anemia   . CHF (congestive heart failure)   . Prostate disorder   . Hernia, abdominal    Past Surgical History  Procedure Laterality Date  . Cataract extraction    . Cholecystectomy    . Hernia repair  02/22/11    right  . Joint replacement      fractured hip  . Total hip arthroplasty Right 10/29/2012    Procedure: CONVERSION OF PREVIOUS  HIP SURGERY TO RIGHT TOTAL HIP AND REMOVAL OF IM NAIL ;  Surgeon: Shelda Pal, MD;  Location: [facility];  Service: Orthopedics;  Laterality: Right;  . Esophagogastroduodenoscopy N/A 11/16/2012    Procedure: ESOPHAGOGASTRODUODENOSCOPY (EGD);  Surgeon: Theda Belfast, MD;  Location: [facility];  Service: Endoscopy;  Laterality: N/A;  . Intramedullary (im) nail intertrochanteric Left 10/17/2013    Procedure: INTRAMEDULLARY (IM) NAIL INTERTROCHANTRIC;  Surgeon: Shelda Pal, MD;  Location: [facility];  Service: Orthopedics;  Laterality: Left;  . Esophagogastroduodenoscopy N/A 07/27/2014    Procedure: ESOPHAGOGASTRODUODENOSCOPY (EGD);  Surgeon: Graylin Shiver, MD;  Location: [facility] ENDOSCOPY;  Service: Endoscopy;  Laterality: N/A;   Social History:  reports that he has never smoked. He has never used smokeless tobacco. He reports that he does not drink alcohol or use illicit drugs.  Allergies  Allergen Reactions  . Sertraline Nausea Only  . Nsaids Other (See Comments)    history of renal failure    Family History  Problem Relation Age of Onset  . Heart failure Mother     heart  . Heart failure Father     fell and hit head  . Cancer Brother     colon  . Diabetes Daughter      Prior to Admission medications   Medication Sig Start Date End Date Taking?  Authorizing Provider  Acetaminophen (TYLENOL) 167 MG/5ML LIQD Take by mouth. 500 mg every 4 hours while awake via feeding tube    Historical Provider, MD  AMBULATORY NON FORMULARY MEDICATION Suctioning Machine Dx: Myasthenia Gravis and Dysphagia 09/26/14   Kermit Balo, DO  esomeprazole (NEXIUM) 40 MG packet Take  twice daily via Peg tube for reflux 10/10/14   Tiffany L Reed, DO  Nutritional Supplements (FEEDING SUPPLEMENT, JEVITY 1.2 CAL,) LIQD Place 1,000 mLs into feeding tube continuous. 08/01/14   Penny Pia, MD  predniSONE (DELTASONE) 10 MG tablet Take 2 tablets (20 mg total) by mouth daily with breakfast. 10/03/14   Cira Servant, DO    pyridostigmine (MESTINON) 60 MG tablet Take 1 tablet (60 mg total) by mouth 3 (three) times daily. 09/17/14   Cira Servant, DO  scopolamine (TRANSDERM-SCOP) 1 MG/3DAYS Place one patch behind the ear, change every 3 days forto help dry secretions 10/10/14   Kermit Balo, DO  silodosin (RAPAFLO) 8 MG CAPS capsule Place 1 capsule (8 mg total) into feeding tube daily with breakfast. 09/08/14   [name], NP   Physical Exam: Filed Vitals:   11-07-14 1500 11/07/14 1655 2014/11/07 1845 07-Nov-2014 2201  BP: 120/62 118/63 152/71 139/60  Pulse: 69 71 77 70  Temp:  98.3 F (36.8 C)  97.9 F (36.6 C)  TempSrc:  Oral  Oral  Resp:  Height:     (1.549 m)  Weight:    49 kg (108 lb 0.4 oz)  SpO2: 94% 95% 95% 96%    Wt Readings from Last 3 Encounters:  11/07/14 49 kg (108 lb 0.4 oz)  10/03/14 49.986 kg (110 lb 3.2 oz)  10/03/14 50.349 kg (111 lb)    General:  Appears calm and comfortable Eyes: PERRL, abnormal lids and conjunctiva ENT: grossly normal hearing, but not following commands Neck: no LAD, masses or thyromegaly Cardiovascular: RRR, no m/r/g. No LE edema. Respiratory: CTA bilaterally, no w/r/r. Normal respiratory effort. Abdomen: soft, ntnd Skin: no rash Musculoskeletal: grossly normal tone BUE/BLE Psychiatric: confused Neurologic: unable to assess.          Labs on Admission:  Basic Metabolic Panel:  Recent Labs Lab November 07, 2014 1230  NA 123*  K 4.5  CL 89*  CO2 28  GLUCOSE 151*  BUN 36*  CREATININE 0.77  CALCIUM 8.3*   Liver Function Tests:  Recent Labs Lab 2014/11/07 1230  AST 32  ALT 18  ALKPHOS 69  BILITOT 0.7  PROT 6.0  ALBUMIN 3.6   No results for input(s): LIPASE, AMYLASE in the last 168 hours. No results for input(s): AMMONIA in the last 168 hours. CBC:  Recent Labs Lab 2014-11-07 1230  WBC 7.6  NEUTROABS 7.2  HGB 13.6  HCT 39.8  MCV 91.5  PLT 137*   Cardiac Enzymes:  Recent Labs Lab 11-07-2014 1230  TROPONINI <0.03     BNP (last 3 results) No results for input(s): BNP in the last 8760 hours.  ProBNP (last 3 results)  Recent Labs  12/27/13 0950 05/20/14 1728 07/20/14 1130  PROBNP 347.2 283.4 383.1    CBG: No results for input(s): GLUCAP in the last 168 hours.  Radiological Exams on Admission: Ct Head Wo Contrast  11/07/14   CLINICAL DATA:  Mental status change  EXAM: CT HEAD WITHOUT CONTRAST  TECHNIQUE: Contiguous axial images were obtained from the base of the skull through the vertex without intravenous contrast.  COMPARISON:  CT head  07/24/2014  FINDINGS: No skull fracture is noted. Paranasal sinuses and mastoid air cells are unremarkable. Atherosclerotic calcifications of carotid siphon. Stable cerebral atrophy. Stable periventricular and patchy subcortical chronic white matter disease. No acute cortical infarction. No mass lesion is noted on this unenhanced scan.  IMPRESSION: No acute intracranial abnormality. Stable atrophy and chronic white matter disease.   Electronically Signed   By: Natasha MeadLiviu  Pop M.D.   On: 10/10/2014 14:30   Dg Chest Port 1 View  10/06/2014   CLINICAL DATA:  Shortness of breath, history of pleural effusion on the right  EXAM: PORTABLE CHEST - 1 VIEW  COMPARISON:  PA and lateral chest x-ray of October 03, 2014  FINDINGS: The left lung is mildly hypoinflated but clear. On the right there is a moderate-sized pleural effusion which is stable. The right heart border is obscured. The central pulmonary vascularity is mildly prominent. There are degenerative changes of the shoulders.  IMPRESSION: Bilateral pulmonary hypoinflation accentuates the interstitial lung markings. There remains a moderate-sized right pleural effusion.   Electronically Signed   By: David  SwazilandJordan   On: 10/05/2014 14:01      Assessment/Plan Principal Problem:   Hyponatremia Active Problems:   Essential hypertension   Anemia   CKD (chronic kidney disease) stage 3, GFR 30-59 ml/min   Altered mental  status   1. Hyponatremia -will hydrate gently -will repeat labs -patient has a history of a pleural effusion I suspect there may be an underlying malignancy which could very well be contributing but the family is very clear that they do not want any further workup. They have seen Dr Vassie LollAlva for this  2. Altered Mental Status -due to hyponatremia -will hydrate  3. Pleural Effusion -He has been tapped in the past twice and has been negative for cytology -family does not want any further workup  4. Myesthenia Gravis -will continue with current medications -they do want to go to [facility] to get another opinion but do not want to be aggressive  5. CKD Stage III -will hydrate gently  -monitor labs  6. Hyperglycemia -will monitor FSBS -check A1C    Code Status: DNR (must indicate code status--if unknown or must be presumed, indicate so) DVT Prophylaxis:Heparin Family Communication: Daughter (indicate person spoken with, if applicable, with phone number if by telephone) Disposition Plan: Home (indicate anticipated LOS)  Time spent: 70min  Anderson Regional Medical CenterKHAN,SAADAT A [facility] Pager [phone]

## 2014-10-16 NOTE — ED Notes (Signed)
Confusion since yesterday. He lives with his daughter. She drove him here for evaluation.

## 2014-10-16 NOTE — ED Notes (Signed)
Pt. Placed on telemetry and is NSR on monitor.

## 2014-10-16 NOTE — ED Notes (Signed)
Pt. Daughter left to go home and is aware of where [facility] is going to [facility]  And that he will be going by [facility]

## 2014-10-16 NOTE — ED Notes (Signed)
Pt's daughter sts that his home health nurse placed a scopolamine patch behind pt's right ear 2 days ago to help with his secretions from myasthenia gravis. Since then, pt has been listless and confused. Last night the pt saw a woman in his bedroom. Pt remembers seeing the woman.

## 2014-10-16 NOTE — ED Notes (Signed)
In with Pt.   Placed Pt. On bedpan and helped Pt. With urinal.  Cleaned Pt. After using urinal and bedpan.  Placed Pt. In up right position and in a new depends with fresh blankets and clean gown.  Pt. In no distress with stable heart rate and rhythm of NSR.

## 2014-10-16 NOTE — ED Notes (Signed)
[facility] onsite for transport. 

## 2014-10-16 NOTE — ED Notes (Signed)
[facility] has been notified of the change of bed to 3E15C

## 2014-10-16 NOTE — ED Notes (Signed)
Pt's daughter sandy immediately notified of [facility] en route status to [facility]. Per [facility] at 2000, eta 10-15 minutes, sandy informed that they would arrive to [facility] within that time frame. Confirmed that they are aware of transport need time, name and number given to sandy, pt's daughter to return call with any concerns

## 2014-10-16 NOTE — ED Notes (Signed)
Pt. Drools constantly.  HOB elevated to allow Pt. Less chance of choking.  Pt. Has call bell at bedside and bed is in view of RN taking care of Pt.

## 2014-10-16 NOTE — ED Notes (Signed)
Pt. Daughter called upset that Pt. Had not been transported yet.  Explained that the Pt. Was doing well and being cared for.  Pt. Daughter very upset and RN Earlene PlaterDavis explained that the pt. Will be transported soon.  Pt. Daughter to speak with Stana BuntingJulie Charge RN.

## 2014-10-16 NOTE — ED Notes (Signed)
Patient denies pain and is resting comfortably.  

## 2014-10-17 ENCOUNTER — Telehealth: Payer: Self-pay | Admitting: *Deleted

## 2014-10-17 LAB — BASIC METABOLIC PANEL
Anion gap: 7 (ref 5–15)
BUN: 23 mg/dL (ref 6–23)
CO2: 29 mmol/L (ref 19–32)
CREATININE: 0.95 mg/dL (ref 0.50–1.35)
Calcium: 8.5 mg/dL (ref 8.4–10.5)
Chloride: 95 mmol/L — ABNORMAL LOW (ref 96–112)
GFR calc non Af Amer: 73 mL/min — ABNORMAL LOW (ref >90)
GFR, EST AFRICAN AMERICAN: 84 mL/min — AB (ref >90)
Glucose, Bld: 77 mg/dL (ref 70–99)
Potassium: 4.3 mmol/L (ref 3.5–5.1)
Sodium: 131 mmol/L — ABNORMAL LOW (ref 135–145)

## 2014-10-17 LAB — COMPREHENSIVE METABOLIC PANEL
ALK PHOS: 63 U/L (ref 39–117)
ALT: 17 U/L (ref 0–53)
AST: 25 U/L (ref 0–37)
Albumin: 3 g/dL — ABNORMAL LOW (ref 3.5–5.2)
Anion gap: 9 (ref 5–15)
BUN: 25 mg/dL — ABNORMAL HIGH (ref 6–23)
CO2: 28 mmol/L (ref 19–32)
Calcium: 8.5 mg/dL (ref 8.4–10.5)
Chloride: 93 mmol/L — ABNORMAL LOW (ref 96–112)
Creatinine, Ser: 0.91 mg/dL (ref 0.50–1.35)
GFR calc Af Amer: 86 mL/min — ABNORMAL LOW (ref >90)
GFR calc non Af Amer: 74 mL/min — ABNORMAL LOW (ref >90)
Glucose, Bld: 76 mg/dL (ref 70–99)
POTASSIUM: 4.3 mmol/L (ref 3.5–5.1)
SODIUM: 130 mmol/L — AB (ref 135–145)
TOTAL PROTEIN: 5 g/dL — AB (ref 6.0–8.3)
Total Bilirubin: 1 mg/dL (ref 0.3–1.2)

## 2014-10-17 LAB — CBC
HCT: 36.9 % — ABNORMAL LOW (ref 39.0–52.0)
Hemoglobin: 12.5 g/dL — ABNORMAL LOW (ref 13.0–17.0)
MCH: 30.5 pg (ref 26.0–34.0)
MCHC: 33.9 g/dL (ref 30.0–36.0)
MCV: 90 fL (ref 78.0–100.0)
Platelets: 126 10*3/uL — ABNORMAL LOW (ref 150–400)
RBC: 4.1 MIL/uL — ABNORMAL LOW (ref 4.22–5.81)
RDW: 13.4 % (ref 11.5–15.5)
WBC: 7.3 10*3/uL (ref 4.0–10.5)

## 2014-10-17 LAB — OSMOLALITY, URINE: OSMOLALITY UR: 265 mosm/kg — AB (ref 390–1090)

## 2014-10-17 LAB — TSH: TSH: 2.784 u[IU]/mL (ref 0.350–4.500)

## 2014-10-17 MED ORDER — FREE WATER
100.0000 mL | Freq: Two times a day (BID) | Status: DC
  Administered 2014-10-17: 100 mL

## 2014-10-17 MED ORDER — JEVITY 1.2 CAL PO LIQD
237.0000 mL | Freq: Every day | ORAL | Status: DC
  Administered 2014-10-17 (×3): 237 mL
  Filled 2014-10-17 (×12): qty 237

## 2014-10-17 NOTE — Progress Notes (Signed)
Repositioned patient per family request.  Patient wanting to get out of bed, however he is unable to stand at this time.  He is full body weight with max assitance level. Patient is moving around a lot and pulling off blankets.  Wrapped the infusing IV as to not lose IV access.

## 2014-10-17 NOTE — Telephone Encounter (Signed)
[name], Social workerCargiver called and stated that patient is in Hospital and needs referral for Hospice. Told her that once patient was released to schedule an appointment and we would set that Hospice referral up for her. She agreed.

## 2014-10-17 NOTE — Progress Notes (Signed)
Med rec re-completed with patient daughter upon verbal request from Dr. Thedore MinsSingh that home meds were wrong. Med rec had previously been completed by pharmacy technician this AM. All were exactly as daughter dictated, none were incorrect. I only updated the directions for tube feeds to include more specific home scheduling.  Raymonde Hamblin S. Merilynn Finlandobertson, PharmD, BCPS Clinical Staff Pharmacist Pager [phone]

## 2014-10-17 NOTE — Progress Notes (Addendum)
INITIAL NUTRITION ASSESSMENT  DOCUMENTATION CODES Per approved criteria  -Severe malnutrition in the context of chronic illness  Pt meets criteria for SEVERE MALNUTRITION in the context of CHRONIC ILLNESS as evidenced by 20% weight loss in less than 1 year and severe muscle and fat wasting.  INTERVENTION: Change to bolus tube feedings,  Give 1 can of Jevity 1.2 via PEG 5 times daily (7AM,10AM,1PM,4PM, 7PM) to provide 1440 kcal, 67 grams of protein, and 972 ml of water. Provide 30 ml free water flush before and after each bolus feed plus 100 ml flush BID. This will provide a total of 1472 ml of water daily.    NUTRITION DIAGNOSIS: Inadequate oral intake related to inability to eat as evidenced by NPO status/ PEG dependence.   Goal: Pt to meet >/= 90% of their estimated nutrition needs   Monitor:  TF initiation/tolerance, weight trend, labs  Reason for Assessment: Consult for Enteral/tube feeding initiation and management/ Malnutrition Screening Tool  81 y.o. male  Admitting Dx: Hyponatremia  ASSESSMENT: 81 y.o. male presents with altered mental status.  Pt had PEG placed 07/30/14 per chart.   Per pt's daughter PTA, pt was getting 1 can of Jevity 1.2 QID via PEG with 12 ounces of water with each can. This is provided in bolus gravity feeds using a syringe. This provides 1152 kcal, 53 grams of protein, and 2218 ml of water. This provides 88% of estimated energy needs, 82% of estimate protein needs and 148% of estimated fluid needs.  Per daughter, pt is supposed to be getting 6 cans of Jevity 1.2 daily but, pt was unable to tolerate 1.5 cans (small amount of residual 3 hours later per daughter. Daughter reports that patient has been tolerating tube feedings well- no nausea, vomiting or abdominal pain; diarrhea once every other day.   Instructed daughter to give 1.5 cans QID and to give free water in between feedings.  Pt lost 20% of his body weight in the past year; he has had  additional 5.3% weight loss in less than one month and has severe muscle wasting.   Currently pt is receiving Jevity 1.2 @ 50 ml/hr continuously with 200 ml free water flushes every 6 hours. This provides 1440 kcal, 67 grams of protein, and 1772 ml of water daily. Pt is also receiving sodium chloride IV infusion at 75 ml/hr which provides an additional 1.8 L daily.    Labs: low sodium  Recommend the following bolus regimen:   Give 1 can of Jevity 1.2, 5 times daily (suggest 7AM,10AM,1PM,4PM, 7PM) to provide 1440 kcal, 67 grams of protein, and 972 ml of water. Provide 30 ml free water flush before and after each bolus feed plus 100 ml flush BID. This will provide a total of 1472 ml of water daily.    Height: Ht Readings from Last 1 Encounters:  10/03/2014  (1.549 m)    Weight: Wt Readings from Last 1 Encounters:  10/17/14 108 lb 7.5 oz (49.2 kg)    Ideal Body Weight: 112 lbs  % Ideal Body Weight: 96%  Wt Readings from Last 10 Encounters:  10/17/14 108 lb 7.5 oz (49.2 kg)  10/03/14 110 lb 3.2 oz (49.986 kg)  10/03/14 111 lb (50.349 kg)  09/17/14 114 lb 6.4 oz (51.891 kg)  09/08/14 114 lb 8 oz (51.937 kg)  08/26/14 117 lb 3.2 oz (53.162 kg)  08/08/14 117 lb (53.071 kg)  08/01/14 118 lb 4.8 oz (53.661 kg)  07/22/14 113 lb 6.4 oz (51.438  kg)  07/22/14 115 lb 12.8 oz (52.527 kg)    Usual Body Weight: 132 lbs (March 2014)  % Usual Body Weight: 82%  BMI:  Body mass index is 20.51 kg/(m^2).  Estimated Nutritional Needs: Kcal: 1300-1500 Protein: 65-75 grams Fluid: 1.3-1.5 L/day  Skin: +1 RLE edema, +3 LLE edema; stage I pressure ulcer on left sacrum  Diet Order:    EDUCATION NEEDS: -No education needs identified at this time   Intake/Output Summary (Last 24 hours) at 10/17/14 1218 Last data filed at 10/17/14 1053  Gross per 24 hour  Intake 716.67 ml  Output    690 ml  Net  26.67 ml    Last BM: 2/25  Labs:   Recent Labs Lab 03/05/2015 1230 03/05/2015 2248  10/17/14 0455 10/17/14 0803  NA 123*  --  130* 131*  K 4.5  --  4.3 4.3  CL 89*  --  93* 95*  CO2 28  --  28 29  BUN 36*  --  25* 23  CREATININE 0.77 0.84 0.91 0.95  CALCIUM 8.3*  --  8.5 8.5  GLUCOSE 151*  --  76 77    CBG (last 3)  No results for input(s): GLUCAP in the last 72 hours.  Scheduled Meds: . aspirin EC  325 mg Oral Daily  . folic acid  1 mg Oral Daily  . heparin  5,000 Units Subcutaneous 3 times per day  . multivitamin with minerals  1 tablet Oral Daily  . pantoprazole sodium  40 mg Oral QAC breakfast  . predniSONE  20 mg Oral Q breakfast  . pyridostigmine  60 mg Oral TID  . sodium chloride  3 mL Intravenous Q12H  . tamsulosin  0.4 mg Oral Daily  . thiamine  100 mg Oral Daily    Continuous Infusions: . sodium chloride 75 mL/hr at 03/05/2015 2242  . feeding supplement (JEVITY 1.2 CAL) 1,000 mL (10/17/14 16100842)    Past Medical History  Diagnosis Date  . Gout   . Insomnia   . BPH (benign prostatic hyperplasia)   . Kidney disorder   . Arthritis   . Hypertension     patient denies on 10/25/12 on no meds   . Complication of anesthesia     hard to wake up -2011 after hernia surgery   . Anemia   . CHF (congestive heart failure)   . Prostate disorder   . Hernia, abdominal     Past Surgical History  Procedure Laterality Date  . Cataract extraction    . Cholecystectomy    . Hernia repair  02/22/11    right  . Joint replacement      fractured hip  . Total hip arthroplasty Right 10/29/2012    Procedure: CONVERSION OF PREVIOUS HIP SURGERY TO RIGHT TOTAL HIP AND REMOVAL OF IM NAIL ;  Surgeon: Shelda PalMatthew D Olin, MD;  Location: [facility];  Service: Orthopedics;  Laterality: Right;  . Esophagogastroduodenoscopy N/A 11/16/2012    Procedure: ESOPHAGOGASTRODUODENOSCOPY (EGD);  Surgeon: Theda BelfastPatrick D Hung, MD;  [facility];  Service: Endoscopy;  Laterality: N/A;  . Intramedullary (im) nail intertrochanteric Left 10/17/2013    Procedure: INTRAMEDULLARY (IM) NAIL  INTERTROCHANTRIC;  Surgeon: Shelda PalMatthew D Olin, MD;  Location: [facility];  Service: Orthopedics;  Laterality: Left;  . Esophagogastroduodenoscopy N/A 07/27/2014    Procedure: ESOPHAGOGASTRODUODENOSCOPY (EGD);  Surgeon: Graylin ShiverSalem F Ganem, MD;  [facility];  Service: Endoscopy;  Laterality: N/A;    Ian Malkineanne Barnett RD, LDN Inpatient Clinical Dietitian Pager:  [phone] After Hours Pager: [phone]

## 2014-10-17 NOTE — Progress Notes (Signed)
Patient demanded to speak with "dan" who happens to be his son in law. Called the sister and got dan to call into the patient's room so he could speak with him. Patient seems much calmer now.  Also interminttently suctioning mouth as patient has collection of spit developing every so often.  It seems to help him.

## 2014-10-17 NOTE — Telephone Encounter (Signed)
Patients daughter call to let you know he is in the hospital Call back number  [phone]

## 2014-10-17 NOTE — Progress Notes (Signed)
Patient Demographics  [name], is a 81 y.o. male, DOB - 1925-02-16, ZOX:[name]  Admit date - Jan 05, 2015   Admitting Physician Meredeth IdeGagan S Lama, MD  Outpatient Primary MD for the patient is Sharon SellerEUBANKS, JESSICA K, NP  LOS - 1   Chief Complaint  Patient presents with  . Altered Mental Status        Subjective:   [name] today has, No headache, No chest pain, No abdominal pain - No Nausea, No new weakness tingling or numbness, No Cough - SOB.    Assessment & Plan    1. Generalized weakness secondary to dehydration and hyponatremia. Improving with IV fluids, tube feeds adjusted, dietary consulted, PT, increase activity, supportive care. Feels better.   2. Myasthenia gravis with diffuse generalized weakness, dysphagia. Has a PEG tube, nothing by mouth by mouth, supportive care and PT eval. Outpatient neurology follow-up. Continue home dose Mestinon and steroid.   3.BPH - continue Flomax.   4. GERD. On PPI.     Code Status: Full  Family Communication: family bedside  Disposition Plan: HHPT   Procedures  CT Head   Consults  dietary   Medications  Scheduled Meds: . aspirin EC  325 mg Oral Daily  . folic acid  1 mg Oral Daily  . heparin  5,000 Units Subcutaneous 3 times per day  . multivitamin with minerals  1 tablet Oral Daily  . pantoprazole sodium  40 mg Oral QAC breakfast  . predniSONE  20 mg Oral Q breakfast  . pyridostigmine  60 mg Oral TID  . sodium chloride  3 mL Intravenous Q12H  . tamsulosin  0.4 mg Oral Daily  . thiamine  100 mg Oral Daily   Continuous Infusions: . sodium chloride 75 mL/hr at 04/07/15 2242  . feeding supplement (JEVITY 1.2 CAL) 1,000 mL (10/17/14 0842)   PRN Meds:.acetaminophen **OR** acetaminophen, ondansetron **OR** ondansetron  (ZOFRAN) IV  DVT Prophylaxis    Heparin    Lab Results  Component Value Date   PLT 126* 10/17/2014    Antibiotics    Anti-infectives    None          Objective:   Filed Vitals:   04/07/15 1845 04/07/15 2201 10/17/14 0124 10/17/14 0503  BP: 152/71 139/60 148/68 128/56  Pulse: 77 70 74 65  Temp:  97.9 F (36.6 C) 97.8 F (36.6 C) 97.5 F (36.4 C)  TempSrc:  Oral Oral Oral  Resp: 18 18 18 20   Height:  5\' 1"  (1.549 m)    Weight:  49 kg (108 lb 0.4 oz)  49.2 kg (108 lb 7.5 oz)  SpO2: 95% 96% 93% 93%    Wt Readings from Last 3 Encounters:  10/17/14 49.2 kg (108 lb 7.5 oz)  10/03/14 49.986 kg (110 lb 3.2 oz)  10/03/14 50.349 kg (111 lb)     Intake/Output Summary (Last 24 hours) at 10/17/14 1124 Last data filed at 10/17/14 1053  Gross per 24 hour  Intake 716.67 ml  Output    690 ml  Net  26.67 ml     Physical Exam  Awake Alert, Oriented X 3, No new F.N deficits, diffuse generalized weakness, poor cough reflex. Normal affect [name],PERRAL Supple Neck,No JVD, No cervical lymphadenopathy appriciated.  Symmetrical Chest wall movement, Good air movement bilaterally, CTAB RRR,No Gallops,Rubs or new Murmurs, No Parasternal Heave +ve B.Sounds, Abd Soft, No tenderness, No organomegaly appriciated, No rebound - guarding or rigidity. PEG tube site stable. No Cyanosis, Clubbing or edema, No new Rash or bruise     Data Review   Micro Results No results found for this or any previous visit (from the past 240 hour(s)).  Radiology Reports Dg Chest 2 View  10/03/2014   CLINICAL DATA:  Recurrent right-sided pleural effusion  EXAM: CHEST  2 VIEW  COMPARISON:  Portable chest x-ray of September 16, 2014  FINDINGS: There is a moderate sized right pleural effusion. The volume has increased somewhat since the previous study. On the left there is no pleural effusion. The heart is normal in size. The pulmonary vascularity is not engorged. The bony thorax exhibits chronic compression  of an upper lumbar vertebral body.  IMPRESSION: There has been mild interval increase in the volume of the right pleural effusion. There is no significant left pleural effusion.   Electronically Signed   By: David  [place]   On: 10/03/2014 15:26   Ct Head Wo Contrast  10/10/2014   CLINICAL DATA:  Mental status change  EXAM: CT HEAD WITHOUT CONTRAST  TECHNIQUE: Contiguous axial images were obtained from the base of the skull through the vertex without intravenous contrast.  COMPARISON:  CT head 07/24/2014  FINDINGS: No skull fracture is noted. Paranasal sinuses and mastoid air cells are unremarkable. Atherosclerotic calcifications of carotid siphon. Stable cerebral atrophy. Stable periventricular and patchy subcortical chronic white matter disease. No acute cortical infarction. No mass lesion is noted on this unenhanced scan.  IMPRESSION: No acute intracranial abnormality. Stable atrophy and chronic white matter disease.   Electronically Signed   By: Natasha Mead M.D.   On: 09/26/2014 14:30   Dg Chest Port 1 View  10/07/2014   CLINICAL DATA:  Shortness of breath, history of pleural effusion on the right  EXAM: PORTABLE CHEST - 1 VIEW  COMPARISON:  PA and lateral chest x-ray of October 03, 2014  FINDINGS: The left lung is mildly hypoinflated but clear. On the right there is a moderate-sized pleural effusion which is stable. The right heart border is obscured. The central pulmonary vascularity is mildly prominent. There are degenerative changes of the shoulders.  IMPRESSION: Bilateral pulmonary hypoinflation accentuates the interstitial lung markings. There remains a moderate-sized right pleural effusion.   Electronically Signed   By: David  [place]   On: 09/27/2014 14:01     CBC  Recent Labs Lab 10/02/2014 1230 10/13/2014 2248 10/17/14 0455  WBC 7.6 6.9 7.3  HGB 13.6 12.9* 12.5*  HCT 39.8 37.4* 36.9*  PLT 137* 130* 126*  MCV 91.5 89.0 90.0  MCH 31.3 30.7 30.5  MCHC 34.2 34.5 33.9  RDW 13.2 13.3 13.4    LYMPHSABS 0.1*  --   --   MONOABS 0.2  --   --   EOSABS 0.0  --   --   BASOSABS 0.0  --   --     Chemistries   Recent Labs Lab 10/06/2014 1230 10/09/2014 2248 10/17/14 0455 10/17/14 0803  NA 123*  --  130* 131*  K 4.5  --  4.3 4.3  CL 89*  --  93* 95*  CO2 28  --  28 29  GLUCOSE 151*  --  76 77  BUN 36*  --  25* 23  CREATININE 0.77 0.84 0.91 0.95  CALCIUM 8.3*  --  8.5 8.5  AST 32  --  25  --   ALT 18  --  17  --   ALKPHOS 69  --  63  --   BILITOT 0.7  --  1.0  --    ------------------------------------------------------------------------------------------------------------------ estimated creatinine clearance is 38.1 mL/min (by C-G formula based on Cr of 0.95). ------------------------------------------------------------------------------------------------------------------ No results for input(s): HGBA1C in the last 72 hours. ------------------------------------------------------------------------------------------------------------------ No results for input(s): CHOL, HDL, LDLCALC, TRIG, CHOLHDL, LDLDIRECT in the last 72 hours. ------------------------------------------------------------------------------------------------------------------  Recent Labs  10/07/2014 2248  TSH 2.784   ------------------------------------------------------------------------------------------------------------------ No results for input(s): VITAMINB12, FOLATE, FERRITIN, TIBC, IRON, RETICCTPCT in the last 72 hours.  Coagulation profile No results for input(s): INR, PROTIME in the last 168 hours.  No results for input(s): DDIMER in the last 72 hours.  Cardiac Enzymes  Recent Labs Lab 09/28/2014 1230  TROPONINI <0.03   ------------------------------------------------------------------------------------------------------------------ Invalid input(s): POCBNP     Time Spent in minutes   35   Trayce Maino K M.D on 10/17/2014 at 11:24 AM  Between 7am to 7pm - Pager -  [phone]  After 7pm go to [email or web address] - password [email or web address]  [facility]   Office  [phone]

## 2014-10-18 LAB — BASIC METABOLIC PANEL
ANION GAP: 6 (ref 5–15)
BUN: 24 mg/dL — AB (ref 6–23)
CALCIUM: 8.2 mg/dL — AB (ref 8.4–10.5)
CHLORIDE: 100 mmol/L (ref 96–112)
CO2: 25 mmol/L (ref 19–32)
Creatinine, Ser: 0.75 mg/dL (ref 0.50–1.35)
GFR, EST NON AFRICAN AMERICAN: 80 mL/min — AB (ref >90)
Glucose, Bld: 99 mg/dL (ref 70–99)
POTASSIUM: 4.2 mmol/L (ref 3.5–5.1)
Sodium: 131 mmol/L — ABNORMAL LOW (ref 135–145)

## 2014-10-18 LAB — HEMOGLOBIN A1C
Hgb A1c MFr Bld: 5.6 % (ref 4.8–5.6)
MEAN PLASMA GLUCOSE: 114 mg/dL

## 2014-10-21 ENCOUNTER — Ambulatory Visit: Payer: Medicare Other | Admitting: Neurology

## 2014-10-21 NOTE — Discharge Summary (Signed)
[facility] Death Note                                                                                                                                                                                               Death Note please see Last Note for all details.   In breif -    Radwan Horn ZOX:[name] is a 81 y.o. male, Outpatient Primary MD for the patient is [name], NP  Pronounced dead by RN on   October 21, 2014   @   3.55am                Cause of death advanced myasthenia gravis with dysphagia, likely aspiration of oral secretions, had a PEG tube. Was DO NOT RESUSCITATE.   Leroy Sea M.D on 10/12/2014 at 9:08 AM  [facility]  Office Phone [phone]  Total clinical and documentation time for today Under 30 minutes   Last Note                                                                                            Patient Demographics  [name], is a 81 y.o. male, DOB - 11/14/1924, ONG:[id number]  Admit date - 2014-10-20   Admitting Physician Meredeth Ide, MD  Outpatient Primary MD for the patient is [name], NP  LOS - 2   Chief Complaint  Patient presents with  . Altered Mental Status        Subjective:   [name] today has, No headache, No chest pain, No abdominal pain - No Nausea, No new weakness tingling or numbness, No Cough - SOB.    Assessment & Plan    1. Generalized weakness secondary to dehydration and hyponatremia. Improving with IV fluids, tube feeds adjusted, dietary consulted, PT, increase activity, supportive care. Feels better.   2. Myasthenia gravis with diffuse generalized weakness, dysphagia. Has a PEG tube, nothing by mouth by mouth, supportive care and PT eval. Outpatient neurology follow-up. Continue home dose Mestinon and steroid.   3.BPH - continue Flomax.   4.  GERD. On PPI.     Code Status: Full  Family Communication: family bedside  Disposition Plan: HHPT   Procedures  CT Head   Consults  dietary   Medications  Scheduled Meds: . aspirin EC  325 mg Oral Daily  . feeding supplement (JEVITY 1.2 CAL)  237 mL Per Tube 5 X Daily  . folic acid  1 mg Oral Daily  . free water  100 mL Per Tube BID  . heparin  5,000 Units Subcutaneous 3 times per day  . multivitamin with minerals  1 tablet Oral Daily  . pantoprazole sodium  40 mg Oral QAC breakfast  . predniSONE  20 mg Oral Q breakfast  . pyridostigmine  60 mg Oral TID  . sodium chloride  3 mL Intravenous Q12H  . tamsulosin  0.4 mg Oral Daily  . thiamine  100 mg Oral Daily   Continuous Infusions: . sodium chloride 75 mL/hr at 10/17/14 1600   PRN Meds:.acetaminophen **OR** acetaminophen, ondansetron **OR** ondansetron (ZOFRAN) IV  DVT Prophylaxis    Heparin    Lab Results  Component Value Date   PLT 126* 10/17/2014    Antibiotics    Anti-infectives    None          Objective:   Filed Vitals:   10/17/14 0503 10/17/14 1400 10/17/14 1826 10/17/14 1949  BP: 128/56 139/59 166/86 146/71  Pulse: 65 76 75 58  Temp: 97.5 F (36.4 C) 97.3 F (36.3 C) 97.3 F (36.3 C) 97.7 F (36.5 C)  TempSrc: Oral Oral Oral Oral  Resp: Height:      Weight: 49.2 kg (108 lb 7.5 oz)     SpO2: 93% 98% 96% 98%    Wt Readings from Last 3 Encounters:  10/17/14 49.2 kg (108 lb 7.5 oz)  10/03/14 49.986 kg (110 lb 3.2 oz)  10/03/14 50.349 kg (111 lb)     Intake/Output Summary (Last 24 hours) at 10/14/2014 0908 Last data filed at 10/10/2014 0200  Gross per 24 hour  Intake   1765 ml  Output    750 ml  Net   1015 ml     Physical Exam  Awake Alert, Oriented X 3, No new F.N deficits, diffuse generalized weakness, poor cough reflex. Normal affect [place].AT,PERRAL Supple Neck,No JVD, No cervical lymphadenopathy appriciated.  Symmetrical Chest wall movement, Good air movement  bilaterally, CTAB RRR,No Gallops,Rubs or new Murmurs, No Parasternal Heave +ve B.Sounds, Abd Soft, No tenderness, No organomegaly appriciated, No rebound - guarding or rigidity. PEG tube site stable. No Cyanosis, Clubbing or edema, No new Rash or bruise     Data Review   Micro Results No results found for this or any previous visit (from the past 240 hour(s)).  Radiology Reports Dg Chest 2 View  10/03/2014   CLINICAL DATA:  Recurrent right-sided pleural effusion  EXAM: CHEST  2 VIEW  COMPARISON:  Portable chest x-ray of September 16, 2014  FINDINGS: There is a moderate sized right pleural effusion. The volume has increased somewhat since the previous study. On the left there is no pleural effusion. The heart is normal in size. The pulmonary vascularity is not engorged. The bony thorax exhibits chronic compression of an upper lumbar vertebral body.  IMPRESSION: There has been mild interval increase in the volume of the right pleural effusion. There is no significant left pleural effusion.   Electronically Signed   By: David  [place]   On: 10/03/2014 15:26   Ct Head Wo Contrast  2014-11-14   CLINICAL DATA:  Mental status change  EXAM: CT HEAD WITHOUT CONTRAST  TECHNIQUE: Contiguous axial images were obtained from the base of  the skull through the vertex without intravenous contrast.  COMPARISON:  CT head 07/24/2014  FINDINGS: No skull fracture is noted. Paranasal sinuses and mastoid air cells are unremarkable. Atherosclerotic calcifications of carotid siphon. Stable cerebral atrophy. Stable periventricular and patchy subcortical chronic white matter disease. No acute cortical infarction. No mass lesion is noted on this unenhanced scan.  IMPRESSION: No acute intracranial abnormality. Stable atrophy and chronic white matter disease.   Electronically Signed   By: Natasha MeadLiviu  Pop M.D.   On: 01-13-15 14:30   Dg Chest Port 1 View  Sep 01, 2014   CLINICAL DATA:  Shortness of breath, history of pleural effusion on  the right  EXAM: PORTABLE CHEST - 1 VIEW  COMPARISON:  PA and lateral chest x-ray of October 03, 2014  FINDINGS: The left lung is mildly hypoinflated but clear. On the right there is a moderate-sized pleural effusion which is stable. The right heart border is obscured. The central pulmonary vascularity is mildly prominent. There are degenerative changes of the shoulders.  IMPRESSION: Bilateral pulmonary hypoinflation accentuates the interstitial lung markings. There remains a moderate-sized right pleural effusion.   Electronically Signed   By: David  [place]   On: 01-13-15 14:01     CBC  Recent Labs Lab 01-01-2015 1230 01-01-2015 2248 10/17/14 0455  WBC 7.6 6.9 7.3  HGB 13.6 12.9* 12.5*  HCT 39.8 37.4* 36.9*  PLT 137* 130* 126*  MCV 91.5 89.0 90.0  MCH 31.3 30.7 30.5  MCHC 34.2 34.5 33.9  RDW 13.2 13.3 13.4  LYMPHSABS 0.1*  --   --   MONOABS 0.2  --   --   EOSABS 0.0  --   --   BASOSABS 0.0  --   --     Chemistries   Recent Labs Lab 01-01-2015 1230 01-01-2015 2248 10/17/14 0455 10/17/14 0803 10/05/2014 0317  NA 123*  --  130* 131* 131*  K 4.5  --  4.3 4.3 4.2  CL 89*  --  93* 95* 100  CO2 28  --  28 29 25   GLUCOSE 151*  --  76 77 99  BUN 36*  --  25* 23 24*  CREATININE 0.77 0.84 0.91 0.95 0.75  CALCIUM 8.3*  --  8.5 8.5 8.2*  AST 32  --  25  --   --   ALT 18  --  17  --   --   ALKPHOS 69  --  63  --   --   BILITOT 0.7  --  1.0  --   --    ------------------------------------------------------------------------------------------------------------------ estimated creatinine clearance is 45.3 mL/min (by C-G formula based on Cr of 0.75). ------------------------------------------------------------------------------------------------------------------  Recent Labs  01-01-2015 2248  HGBA1C 5.6   ------------------------------------------------------------------------------------------------------------------ No results for input(s): CHOL, HDL, LDLCALC, TRIG, CHOLHDL, LDLDIRECT  in the last 72 hours. ------------------------------------------------------------------------------------------------------------------  Recent Labs  01-01-2015 2248  TSH 2.784   ------------------------------------------------------------------------------------------------------------------ No results for input(s): VITAMINB12, FOLATE, FERRITIN, TIBC, IRON, RETICCTPCT in the last 72 hours.  Coagulation profile No results for input(s): INR, PROTIME in the last 168 hours.  No results for input(s): DDIMER in the last 72 hours.  Cardiac Enzymes  Recent Labs Lab 01-01-2015 1230  TROPONINI <0.03   ------------------------------------------------------------------------------------------------------------------ Invalid input(s): POCBNP     Time Spent in minutes   35   Panzy Bubeck K M.D on 10/15/2014 at 9:08 AM  Between 7am to 7pm - Pager - [phone]  After 7pm go to [email or web address] - password [email or web address]  [facility]   Office  [phone]      

## 2014-10-21 NOTE — Progress Notes (Signed)
Notified by CMT that patient heart rate was in the 30s.  Myself, and another RN came to assess patient.  Patient was unresponsive.  Notified Lenny Pastelom Callahan of time of death.  Notified the patients daughter [name], she said she will come as soon as she can. Will complete death certificate and other documentation.

## 2014-10-21 DEATH — deceased

## 2014-11-03 ENCOUNTER — Ambulatory Visit: Payer: Medicare Other | Admitting: Nurse Practitioner

## 2014-11-03 ENCOUNTER — Telehealth: Payer: Self-pay | Admitting: Neurology

## 2014-11-04 ENCOUNTER — Ambulatory Visit: Payer: Medicare Other | Admitting: Neurology

## 2014-11-04 NOTE — Telephone Encounter (Signed)
error 

## 2014-11-05 ENCOUNTER — Ambulatory Visit: Payer: Medicare Other | Admitting: Pulmonary Disease

## 2015-11-24 IMAGING — CT CT PELVIS W/O CM
1 series · 16 of 32 positions shown, 20 images · non-contrast
Comparison: Plain films same day as well as 02/22/2013.

CLINICAL DATA: Left hip fracture.

EXAM:
CT PELVIS WITHOUT CONTRAST
TECHNIQUE: Multidetector CT imaging of the pelvis was performed following the
standard protocol without intravenous contrast.

[Series 3: pelvis st · axial · 0.67mm/px · z∈[+1023,+1233]mm · 16 of 48 slices shown, 20 images]
[im 4/48  soft-tissue]
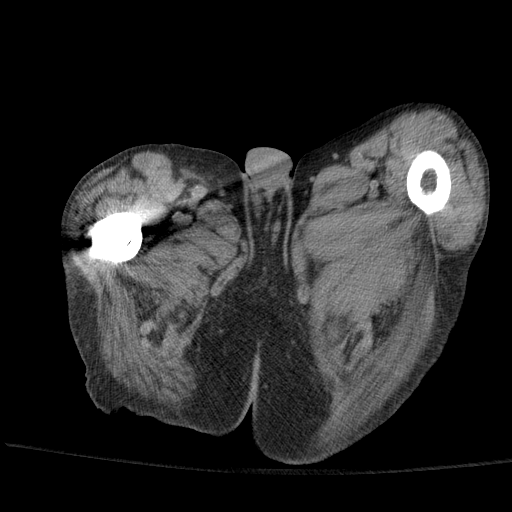
[im 4/48  bone]
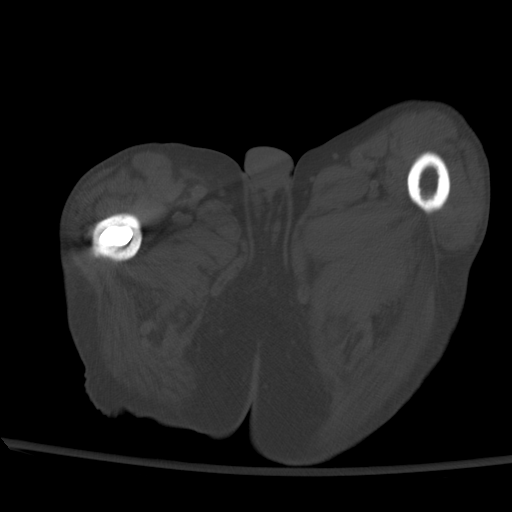
[im 7/48  soft-tissue]
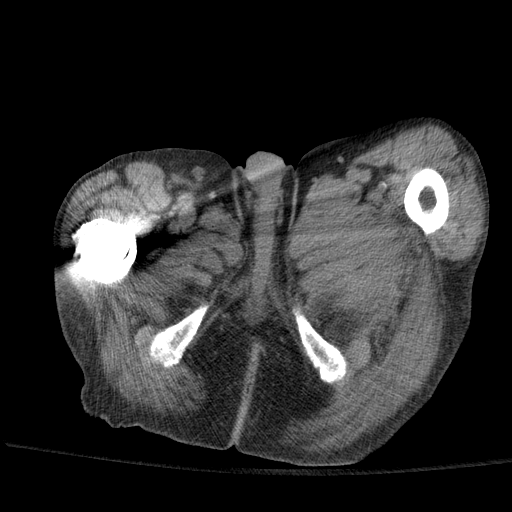
[im 10/48  soft-tissue]
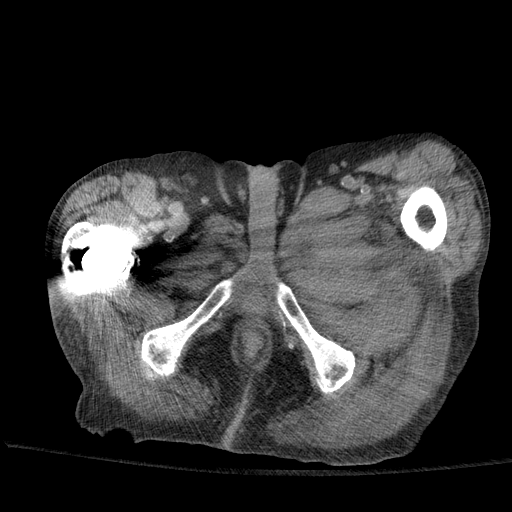
[im 13/48  soft-tissue]
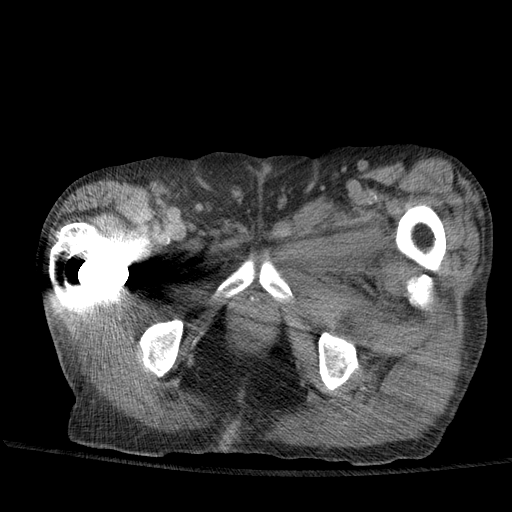
[im 16/48  soft-tissue]
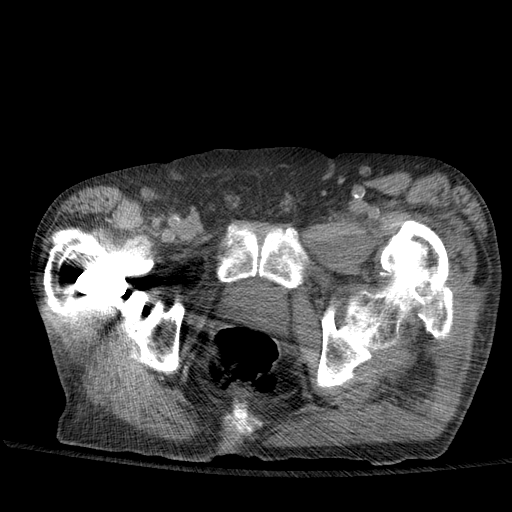
[im 19/48  soft-tissue]
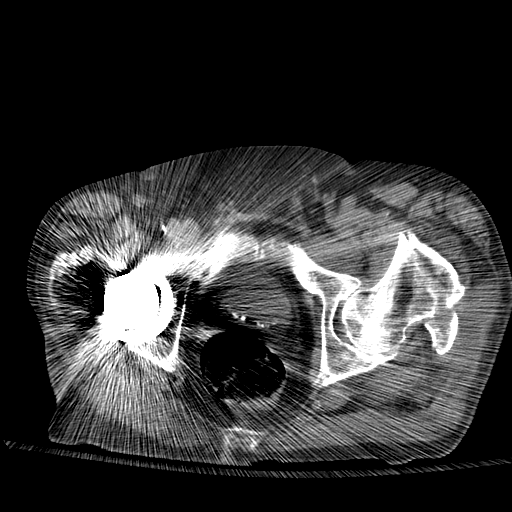
[im 22/48  soft-tissue]
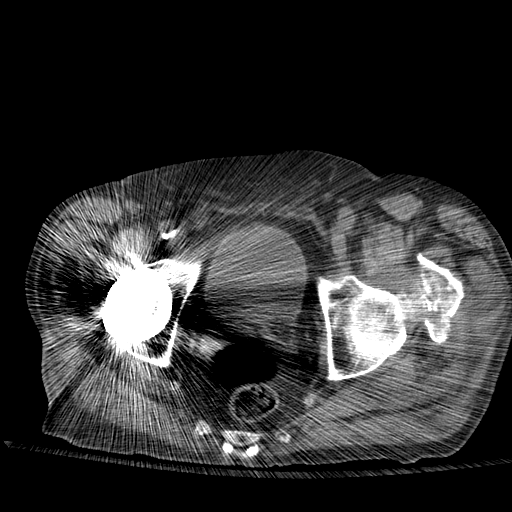
[im 26/48  soft-tissue]
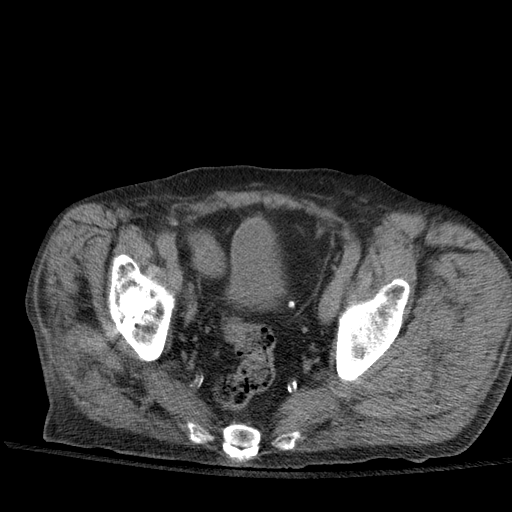
[im 29/48  soft-tissue]
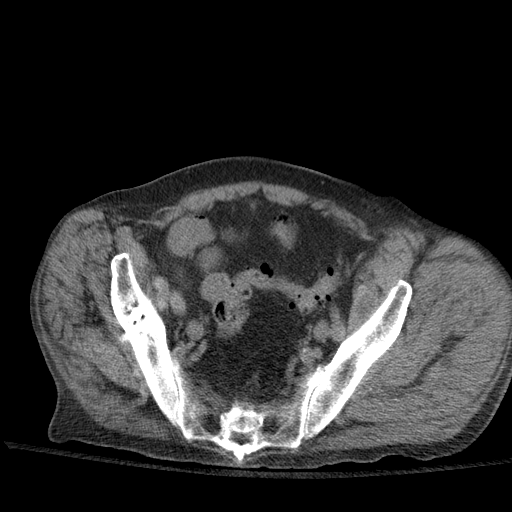
[im 29/48  bone]
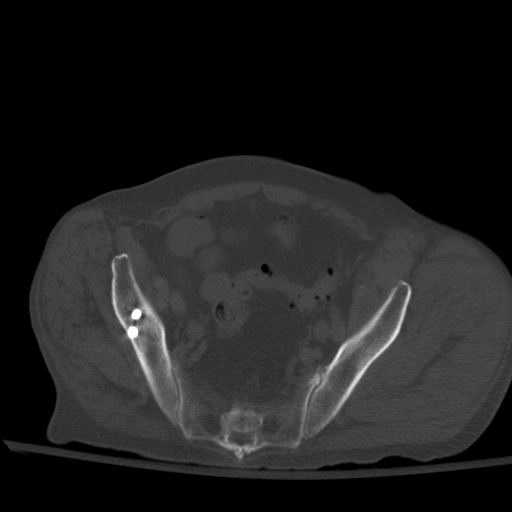
[im 32/48  soft-tissue]
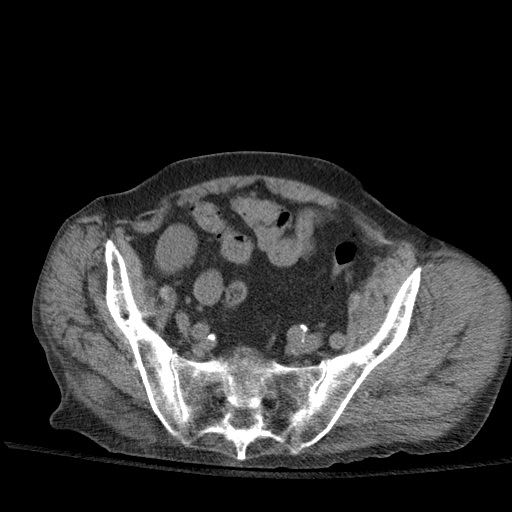
[im 35/48  soft-tissue]
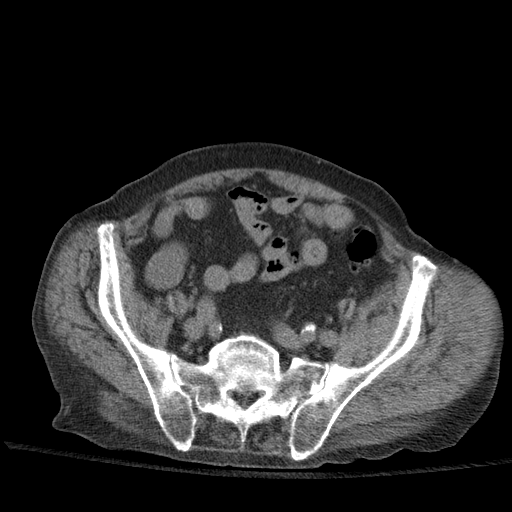
[im 38/48  soft-tissue]
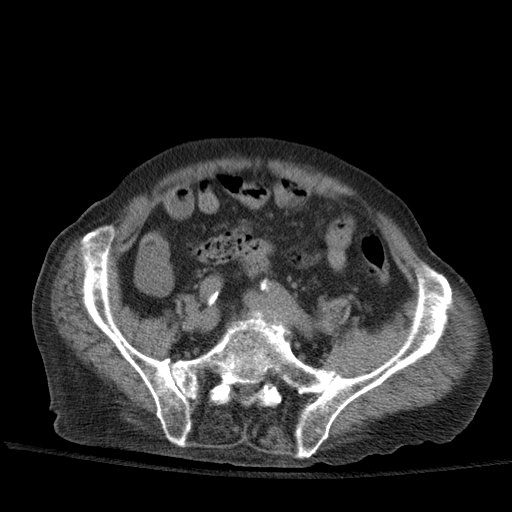
[im 41/48  soft-tissue]
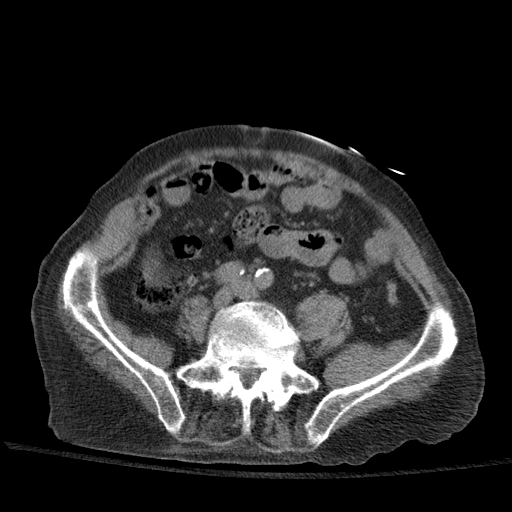
[im 41/48  lung]
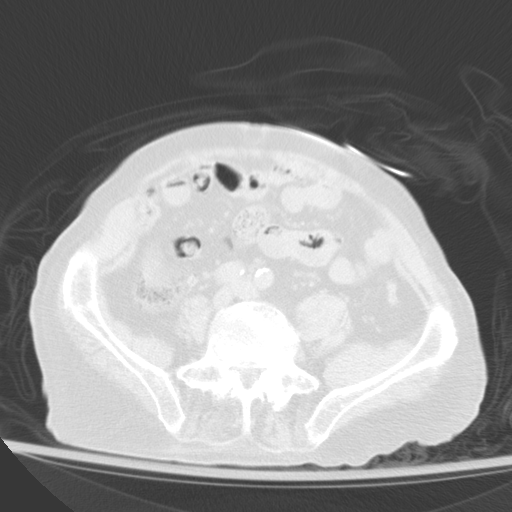
[im 43/48  lung]
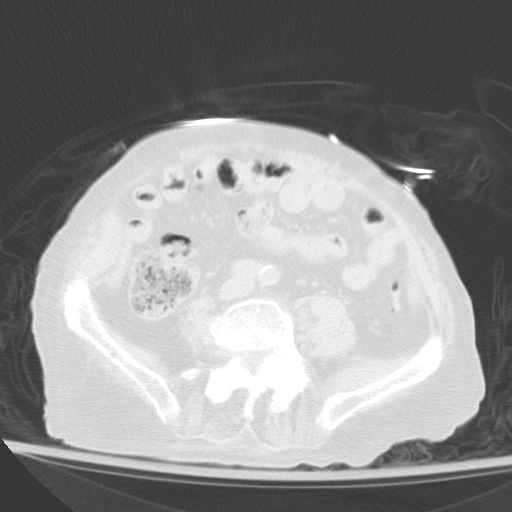
[im 44/48  soft-tissue]
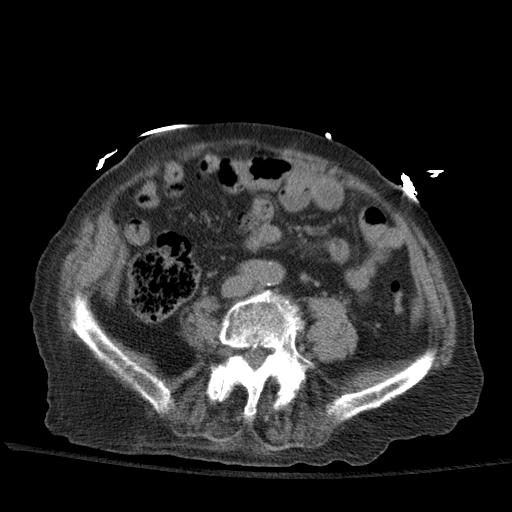
[im 44/48  lung]
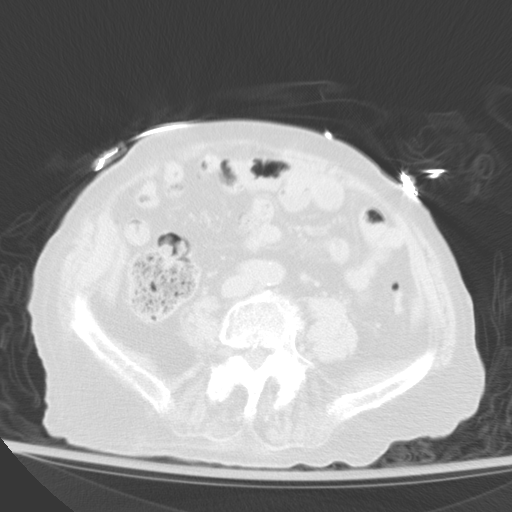
[im 46/48  lung]
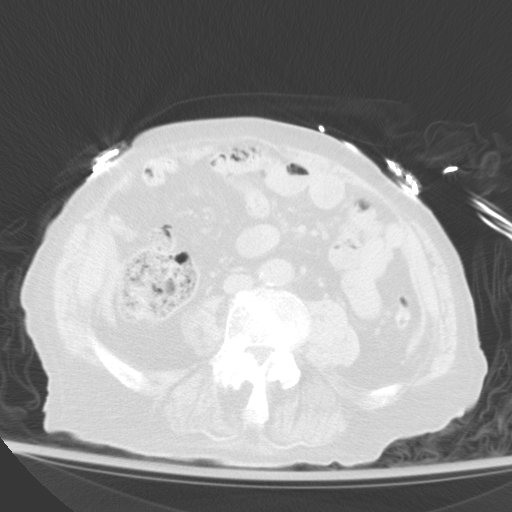

[16 of 32 positions shown; findings below may reference images not displayed]

FINDINGS: Patient's right total hip arthroplasty is intact and normally
located. This causes moderate streak artifact over the pelvis on the
axial images. There is a displaced, minimally comminuted
intertrochanteric fracture of the left hip with exaggerated coxa
vara deformity about the fracture site. No evidence of hip
dislocation. There is mild edematous change within the left gluteal
muscles versus atrophy of the right gluteal muscles. . Mild
degenerative change of the left hip. There are degenerative changes
of the spine with mild grade 1 anterolisthesis of L4 on L5
unchanged.

There is diverticulosis of the colon. Is minimal calcified plaque
over the abdominal aorta and iliac vessels.
IMPRESSION: Displaced slightly comminuted left intertrochanteric fracture.

## 2015-11-24 IMAGING — CT CT CERVICAL SPINE W/O CM
3 of 5 series · 10 of 27 positions shown, 12 images · non-contrast
Comparison: 09/04/2012.

CLINICAL DATA: Fall.

EXAM:
CT HEAD WITHOUT CONTRAST
CT CERVICAL SPINE WITHOUT CONTRAST
TECHNIQUE: Multidetector CT imaging of the head and cervical spine was
performed following the standard protocol without intravenous
contrast. Multiplanar CT image reconstructions of the cervical spine
were also generated.

[Series 3: c-spine st · axial · 0.27mm/px · z∈[-222,-168]mm · 2 of 81 slices shown, 3 images]
[im 27/81  soft-tissue]
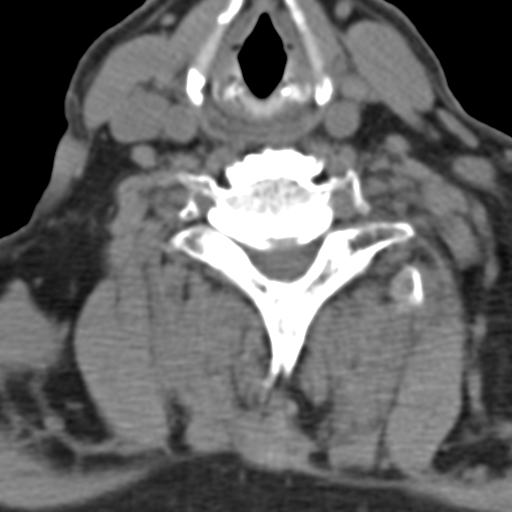
[im 27/81  bone]
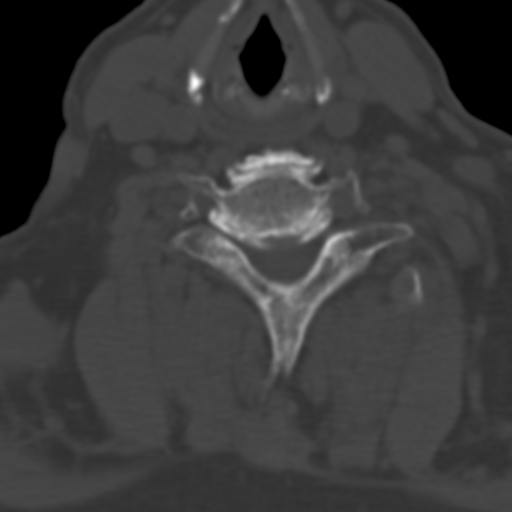
[im 54/81  bone]
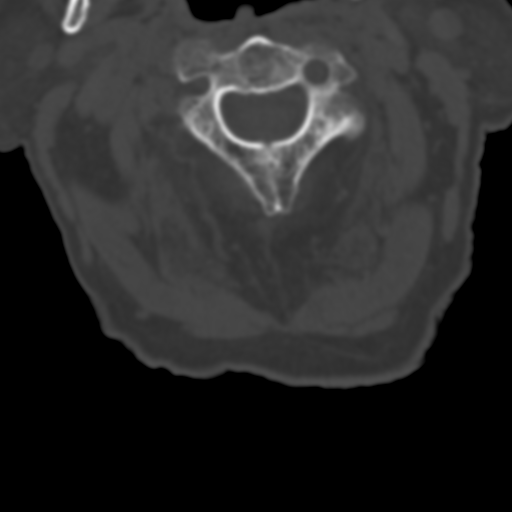

[Series 603: <mpr thick range(1)> · axial · 0.31mm/px · z∈[-277,-211]mm · 3 of 82 slices shown]
[im 21/82  bone]
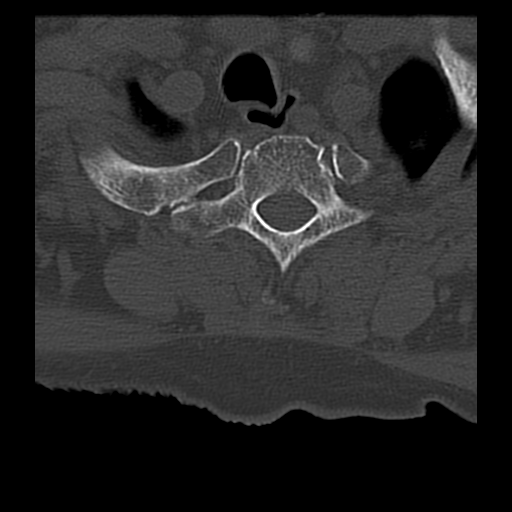
[im 41/82  bone]
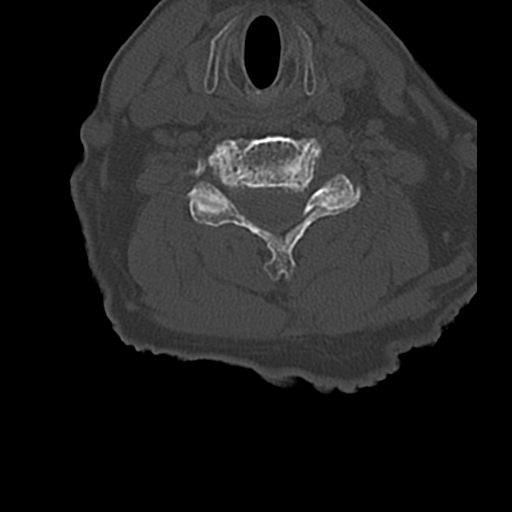
[im 61/82  bone]
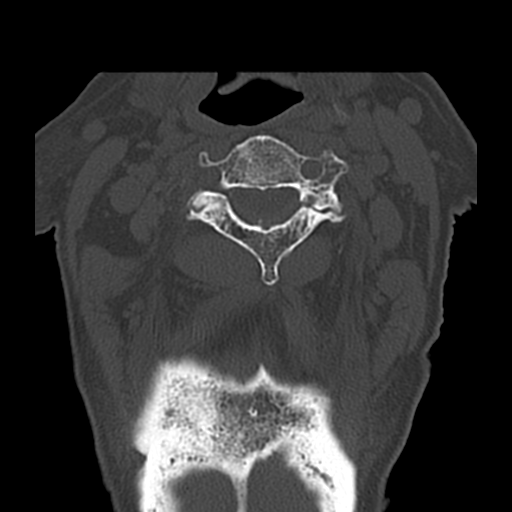

[Series 604: <mpr thick range(2)> · sagittal · 0.31mm/px · 5 of 53 slices shown, 6 images]
[im 18/53  bone]
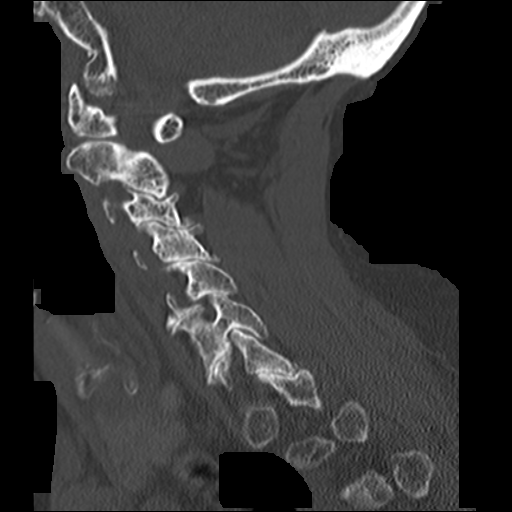
[im 22/53  bone]
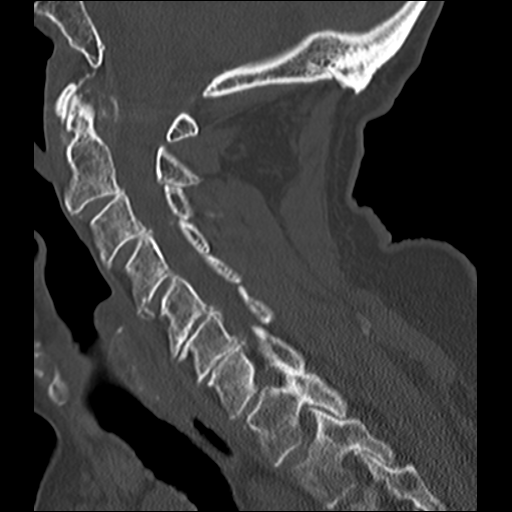
[im 27/53  soft-tissue]
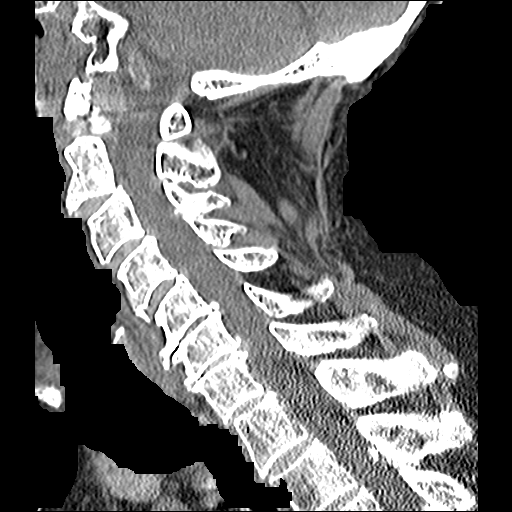
[im 27/53  bone]
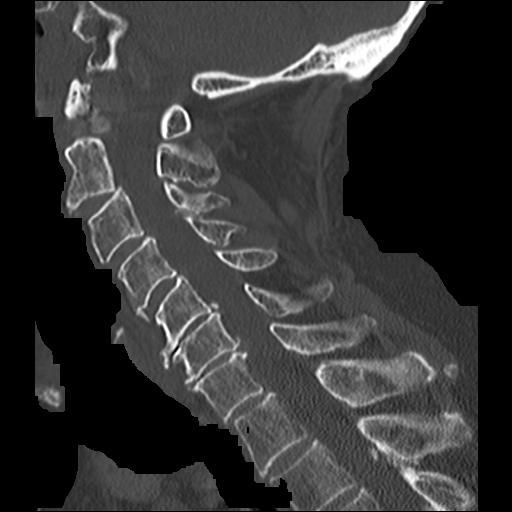
[im 31/53  bone]
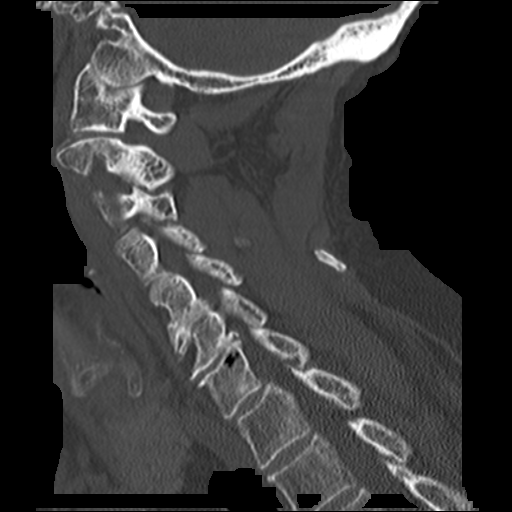
[im 35/53  bone]
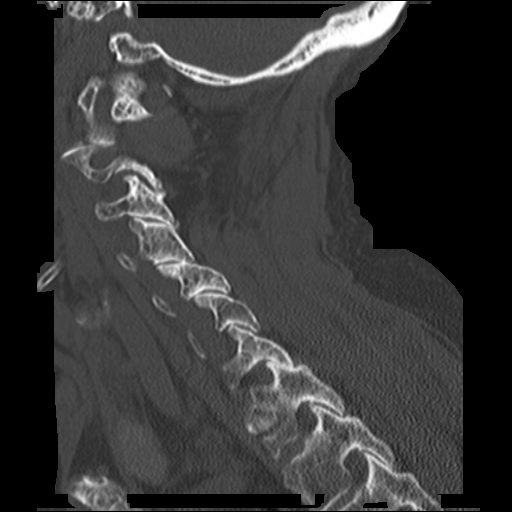

[10 of 27 positions shown; findings below may reference images not displayed]

FINDINGS: CT HEAD FINDINGS

No skull fracture or intracranial hemorrhage.

Small vessel disease type changes without CT evidence of large acute
infarct.

No intracranial mass lesion noted on this unenhanced exam.

Vascular calcifications.

CT CERVICAL SPINE FINDINGS

No cervical spine fracture or malalignment. No abnormal prevertebral
soft tissue swelling.

Cervical spondylotic changes including prominent transverse ligament
hypertrophy. Spinal stenosis most prominent C2-3 follow-up by C5-6
and C6-7 level. If there is any clinical suspicion of ligamentous
injury or cord injury, MR can be obtained for further delineation.

Moderate size right-sided pleural effusion incompletely assessed on
present exam.

Heterogeneous thyroid gland.
IMPRESSION: CT HEAD:

No skull fracture or intracranial hemorrhage.

CT CERVICAL SPINE:

No cervical spine fracture or malalignment. No abnormal prevertebral
soft tissue swelling.

Cervical spondylotic changes including prominent transverse ligament
hypertrophy. Spinal stenosis most prominent C2-3 follow-up by C5-6
and C6-7 level. If there is any clinical suspicion of ligamentous
injury or cord injury, MR can be obtained for further delineation.

Moderate size right-sided pleural effusion incompletely assessed on
present exam.
